# Patient Record
Sex: Male | Born: 1950
Health system: Southern US, Community
[De-identification: ages and names within clinical notes are randomized; demographics above are authoritative.]

## PROBLEM LIST (undated history)

## (undated) DIAGNOSIS — E119 Type 2 diabetes mellitus without complications: Secondary | ICD-10-CM

## (undated) DIAGNOSIS — J9610 Chronic respiratory failure, unspecified whether with hypoxia or hypercapnia: Secondary | ICD-10-CM

## (undated) DIAGNOSIS — N289 Disorder of kidney and ureter, unspecified: Secondary | ICD-10-CM

## (undated) DIAGNOSIS — G4730 Sleep apnea, unspecified: Secondary | ICD-10-CM

## (undated) DIAGNOSIS — I10 Essential (primary) hypertension: Secondary | ICD-10-CM

## (undated) DIAGNOSIS — R7303 Prediabetes: Secondary | ICD-10-CM

## (undated) DIAGNOSIS — R059 Cough, unspecified: Secondary | ICD-10-CM

## (undated) DIAGNOSIS — E7800 Pure hypercholesterolemia, unspecified: Secondary | ICD-10-CM

## (undated) DIAGNOSIS — M1990 Unspecified osteoarthritis, unspecified site: Secondary | ICD-10-CM

## (undated) DIAGNOSIS — R05 Cough: Secondary | ICD-10-CM

## (undated) DIAGNOSIS — Z87442 Personal history of urinary calculi: Secondary | ICD-10-CM

## (undated) DIAGNOSIS — C801 Malignant (primary) neoplasm, unspecified: Secondary | ICD-10-CM

## (undated) DIAGNOSIS — J189 Pneumonia, unspecified organism: Secondary | ICD-10-CM

## (undated) DIAGNOSIS — K08409 Partial loss of teeth, unspecified cause, unspecified class: Secondary | ICD-10-CM

## (undated) DIAGNOSIS — M48062 Spinal stenosis, lumbar region with neurogenic claudication: Secondary | ICD-10-CM

## (undated) DIAGNOSIS — K219 Gastro-esophageal reflux disease without esophagitis: Secondary | ICD-10-CM

## (undated) DIAGNOSIS — K635 Polyp of colon: Secondary | ICD-10-CM

## (undated) DIAGNOSIS — R0600 Dyspnea, unspecified: Secondary | ICD-10-CM

## (undated) HISTORY — PX: LITHOTRIPSY: SUR834

## (undated) HISTORY — PX: VASECTOMY: SHX75

## (undated) HISTORY — PX: SHOULDER SURGERY: SHX246

## (undated) HISTORY — PX: APPENDECTOMY: SHX54

## (undated) HISTORY — PX: COLONOSCOPY W/ POLYPECTOMY: SHX1380

## (undated) HISTORY — PX: BACK SURGERY: SHX140

## (undated) HISTORY — PX: OTHER SURGICAL HISTORY: SHX169

## (undated) HISTORY — PX: HERNIA REPAIR: SHX51

## (undated) HISTORY — PX: CHOLECYSTECTOMY: SHX55

---

## 2001-06-22 ENCOUNTER — Ambulatory Visit (HOSPITAL_COMMUNITY): Admission: RE | Admit: 2001-06-22 | Discharge: 2001-06-22 | Payer: Self-pay | Admitting: Internal Medicine

## 2001-06-22 ENCOUNTER — Encounter: Payer: Self-pay | Admitting: Internal Medicine

## 2001-09-07 ENCOUNTER — Encounter: Payer: Self-pay | Admitting: Family Medicine

## 2001-09-07 ENCOUNTER — Ambulatory Visit (HOSPITAL_COMMUNITY): Admission: RE | Admit: 2001-09-07 | Discharge: 2001-09-07 | Payer: Self-pay | Admitting: Family Medicine

## 2001-09-11 ENCOUNTER — Encounter: Payer: Self-pay | Admitting: Family Medicine

## 2001-09-11 ENCOUNTER — Ambulatory Visit (HOSPITAL_COMMUNITY): Admission: RE | Admit: 2001-09-11 | Discharge: 2001-09-11 | Payer: Self-pay | Admitting: Family Medicine

## 2001-09-28 ENCOUNTER — Encounter: Payer: Self-pay | Admitting: Neurosurgery

## 2001-09-28 ENCOUNTER — Inpatient Hospital Stay (HOSPITAL_COMMUNITY): Admission: RE | Admit: 2001-09-28 | Discharge: 2001-09-29 | Payer: Self-pay | Admitting: Neurosurgery

## 2002-05-25 ENCOUNTER — Encounter: Payer: Self-pay | Admitting: Internal Medicine

## 2002-05-25 ENCOUNTER — Ambulatory Visit (HOSPITAL_COMMUNITY): Admission: RE | Admit: 2002-05-25 | Discharge: 2002-05-25 | Payer: Self-pay | Admitting: Internal Medicine

## 2002-06-03 ENCOUNTER — Encounter (HOSPITAL_COMMUNITY): Admission: RE | Admit: 2002-06-03 | Discharge: 2002-07-03 | Payer: Self-pay | Admitting: Neurosurgery

## 2003-01-11 ENCOUNTER — Ambulatory Visit (HOSPITAL_COMMUNITY): Admission: RE | Admit: 2003-01-11 | Discharge: 2003-01-11 | Payer: Self-pay | Admitting: Neurosurgery

## 2003-01-11 ENCOUNTER — Encounter: Payer: Self-pay | Admitting: Neurosurgery

## 2003-03-18 ENCOUNTER — Encounter: Payer: Self-pay | Admitting: Urology

## 2003-03-18 ENCOUNTER — Ambulatory Visit (HOSPITAL_COMMUNITY): Admission: RE | Admit: 2003-03-18 | Discharge: 2003-03-18 | Payer: Self-pay | Admitting: Urology

## 2003-03-23 ENCOUNTER — Ambulatory Visit (HOSPITAL_COMMUNITY): Admission: RE | Admit: 2003-03-23 | Discharge: 2003-03-23 | Payer: Self-pay | Admitting: Urology

## 2003-03-23 ENCOUNTER — Encounter: Payer: Self-pay | Admitting: Urology

## 2003-03-24 ENCOUNTER — Encounter: Payer: Self-pay | Admitting: Urology

## 2003-03-24 ENCOUNTER — Ambulatory Visit (HOSPITAL_COMMUNITY): Admission: RE | Admit: 2003-03-24 | Discharge: 2003-03-24 | Payer: Self-pay | Admitting: *Deleted

## 2003-04-11 ENCOUNTER — Encounter: Payer: Self-pay | Admitting: Urology

## 2003-04-11 ENCOUNTER — Ambulatory Visit (HOSPITAL_COMMUNITY): Admission: RE | Admit: 2003-04-11 | Discharge: 2003-04-11 | Payer: Self-pay | Admitting: Urology

## 2003-06-01 ENCOUNTER — Encounter: Payer: Self-pay | Admitting: Urology

## 2003-06-01 ENCOUNTER — Ambulatory Visit (HOSPITAL_COMMUNITY): Admission: RE | Admit: 2003-06-01 | Discharge: 2003-06-01 | Payer: Self-pay | Admitting: Urology

## 2003-11-17 ENCOUNTER — Ambulatory Visit (HOSPITAL_COMMUNITY): Admission: RE | Admit: 2003-11-17 | Discharge: 2003-11-17 | Payer: Self-pay | Admitting: Family Medicine

## 2005-08-16 ENCOUNTER — Ambulatory Visit (HOSPITAL_COMMUNITY): Admission: RE | Admit: 2005-08-16 | Discharge: 2005-08-16 | Payer: Self-pay | Admitting: Family Medicine

## 2005-10-28 ENCOUNTER — Ambulatory Visit (HOSPITAL_COMMUNITY): Admission: RE | Admit: 2005-10-28 | Discharge: 2005-10-28 | Payer: Self-pay | Admitting: Urology

## 2006-07-04 ENCOUNTER — Ambulatory Visit (HOSPITAL_COMMUNITY): Admission: RE | Admit: 2006-07-04 | Discharge: 2006-07-04 | Payer: Self-pay | Admitting: General Surgery

## 2006-07-04 ENCOUNTER — Encounter (INDEPENDENT_AMBULATORY_CARE_PROVIDER_SITE_OTHER): Payer: Self-pay | Admitting: Specialist

## 2007-02-17 ENCOUNTER — Emergency Department: Payer: Self-pay | Admitting: Emergency Medicine

## 2007-05-29 ENCOUNTER — Ambulatory Visit (HOSPITAL_COMMUNITY): Admission: RE | Admit: 2007-05-29 | Discharge: 2007-05-29 | Payer: Self-pay | Admitting: Family Medicine

## 2007-06-12 ENCOUNTER — Ambulatory Visit (HOSPITAL_COMMUNITY): Admission: RE | Admit: 2007-06-12 | Discharge: 2007-06-12 | Payer: Self-pay | Admitting: Family Medicine

## 2007-06-12 ENCOUNTER — Ambulatory Visit (HOSPITAL_COMMUNITY): Admission: RE | Admit: 2007-06-12 | Discharge: 2007-06-12 | Payer: Self-pay | Admitting: Urology

## 2007-07-10 ENCOUNTER — Ambulatory Visit (HOSPITAL_COMMUNITY): Admission: RE | Admit: 2007-07-10 | Discharge: 2007-07-10 | Payer: Self-pay | Admitting: Urology

## 2007-12-29 ENCOUNTER — Ambulatory Visit (HOSPITAL_COMMUNITY): Admission: RE | Admit: 2007-12-29 | Discharge: 2007-12-29 | Payer: Self-pay | Admitting: Urology

## 2008-01-15 ENCOUNTER — Ambulatory Visit (HOSPITAL_COMMUNITY): Admission: RE | Admit: 2008-01-15 | Discharge: 2008-01-15 | Payer: Self-pay | Admitting: Urology

## 2008-04-11 ENCOUNTER — Ambulatory Visit (HOSPITAL_COMMUNITY): Admission: RE | Admit: 2008-04-11 | Discharge: 2008-04-11 | Payer: Self-pay | Admitting: Urology

## 2008-04-19 ENCOUNTER — Ambulatory Visit (HOSPITAL_COMMUNITY): Admission: RE | Admit: 2008-04-19 | Discharge: 2008-04-19 | Payer: Self-pay | Admitting: General Surgery

## 2008-04-19 ENCOUNTER — Encounter (INDEPENDENT_AMBULATORY_CARE_PROVIDER_SITE_OTHER): Payer: Self-pay | Admitting: General Surgery

## 2008-04-20 ENCOUNTER — Ambulatory Visit (HOSPITAL_COMMUNITY): Admission: RE | Admit: 2008-04-20 | Discharge: 2008-04-20 | Payer: Self-pay | Admitting: General Surgery

## 2009-01-25 ENCOUNTER — Ambulatory Visit (HOSPITAL_COMMUNITY): Admission: RE | Admit: 2009-01-25 | Discharge: 2009-01-25 | Payer: Self-pay | Admitting: Family Medicine

## 2009-10-14 HISTORY — PX: NM MYOVIEW LTD: HXRAD82

## 2010-04-06 ENCOUNTER — Encounter: Admission: RE | Admit: 2010-04-06 | Discharge: 2010-04-06 | Payer: Self-pay | Admitting: Sports Medicine

## 2010-06-29 ENCOUNTER — Ambulatory Visit (HOSPITAL_COMMUNITY)
Admission: RE | Admit: 2010-06-29 | Discharge: 2010-06-29 | Payer: Self-pay | Source: Home / Self Care | Admitting: Family Medicine

## 2010-08-24 ENCOUNTER — Encounter (HOSPITAL_COMMUNITY)
Admission: RE | Admit: 2010-08-24 | Discharge: 2010-09-23 | Payer: Self-pay | Source: Home / Self Care | Attending: Orthopedic Surgery | Admitting: Orthopedic Surgery

## 2010-09-25 ENCOUNTER — Encounter (HOSPITAL_COMMUNITY)
Admission: RE | Admit: 2010-09-25 | Discharge: 2010-10-25 | Payer: Self-pay | Source: Home / Self Care | Attending: Orthopedic Surgery | Admitting: Orthopedic Surgery

## 2010-09-28 ENCOUNTER — Ambulatory Visit (HOSPITAL_COMMUNITY)
Admission: RE | Admit: 2010-09-28 | Discharge: 2010-09-28 | Payer: Self-pay | Source: Home / Self Care | Attending: Family Medicine | Admitting: Family Medicine

## 2010-11-04 ENCOUNTER — Encounter: Payer: Self-pay | Admitting: Urology

## 2011-02-26 NOTE — Op Note (Signed)
NAME:  Mark Cannon, Mark Cannon NO.:  [id number]   MEDICAL RECORD NO.:  [id number]          PATIENT TYPE:  AMB   LOCATION:  DAY                           FACILITY:  [facility]   PHYSICIAN:  Dalia Heading, M.D.  DATE OF BIRTH:  31-Dec-1950   DATE OF PROCEDURE:  04/20/2008  DATE OF DISCHARGE:                               OPERATIVE REPORT   PREOPERATIVE DIAGNOSIS:  Recurrent left inguinal hernia.   POSTOPERATIVE DIAGNOSIS:  Recurrent left inguinal hernia.   PROCEDURE:  Recurrent left inguinal herniorrhaphy.   SURGEON:  Dalia Heading, MD   ANESTHESIA:  General.   INDICATIONS:  This patient is a 60 year old white male status post left  inguinal herniorrhaphy in the remote past who now presents with  recurrent left inguinal hernia.  In addition, he has a right hydrocele  which is to be repaired under the same anesthesia by Dr. Rito Ehrlich of  urology.  The risks and benefits of the procedure including bleeding,  infection, pain, the possibility of vascular compromise to the left  testicle, and the possibility of recurrence of the hernia were fully  explained to the patient, he gave informed consent.   PROCEDURE NOTE:  The patient was placed in the supine position.  After  general anesthesia was administered, the left groin region was prepped  and draped using the usual sterile technique with Betadine.  Surgical  site confirmation was performed.   An oblique incision was made in the left groin region through the  previous surgical scar.  This was taken down to the external oblique  aponeuroses.  A small amount of polypropylene mesh was noted in the  internal ring area.  It was difficult to the assert exactly what type of  previous hernia repair was performed other than a limited amount of mesh  was used.  This appeared to be in the internal ring area.  This was  excised without difficulty.  An indirect hernia was found.  The cord was  retracted superiorly in order to  facilitate placement of a small  polypropylene mesh plug.  It was secured in place at two areas using a 2-  0 Novofil interrupted suture.  An onlay piece of polypropylene mesh was  then placed over this area and the external oblique was covered over  this using 2-0 Novofil interrupted sutures.  The subcutaneous layer was  reapproximated using a 2-0 Vicryl interrupted suture.  The skin was  closed using a 4-0 Vicryl subcuticular suture.  Dermabond was then  applied.   All tape and needle counts were correct at the end of the initial  procedure.  Dr. Rito Ehrlich of urology was then to perform his portion of  the procedure and he will dictate that separately.   COMPLICATIONS:  None.   SPECIMEN:  None.   ESTIMATED BLOOD LOSS:  Minimal.      Dalia Heading, M.D.  Electronically Signed     MAJ/MEDQ  D:  04/20/2008  T:  04/21/2008  Job:  [id number]   cc:   Patrica Duel, M.D.  Fax: [phone]   [facility] Cornea  Rito Ehrlich, M.D.  Fax: [phone]

## 2011-02-26 NOTE — Op Note (Signed)
NAME:  Mark Cannon, Mark Cannon NO.:  [id number]   MEDICAL RECORD NO.:  [id number]          PATIENT TYPE:  AMB   LOCATION:  DAY                           FACILITY:  [facility]   PHYSICIAN:  Dennie Maizes, M.D.   DATE OF BIRTH:  1950/12/10   DATE OF PROCEDURE:  04/20/2008  DATE OF DISCHARGE:                               OPERATIVE REPORT   PREOPERATIVE DIAGNOSIS:  Symptomatic right hydrocele.   POSTOPERATIVE DIAGNOSIS:  Symptomatic right hydrocele.   OPERATIVE PROCEDURE:  Right hydrocelectomy.   ANESTHESIA:  General.   SURGEON:  Dennie Maizes, MD   SPECIMEN:  None.   COMPLICATIONS:  None.   ESTIMATED BLOOD LOSS:  Minimal.   DRAINS:  None.   INDICATIONS FOR THE PROCEDURE:  This 60 year old male had a symptomatic  right hydrocele.  He also had  a recurrent left inguinal hernia.  He was  evaluated by Dr. Lovell Sheehan and scheduled to undergo left inguinal hernia  repair today.  I plan to do a right hydrocelectomy under the same  anesthesia.   DESCRIPTION OF PROCEDURE:  General anesthesia was induced and the  patient was placed on the OR table in the supine position.  Lower  abdomen and genitalia were prepped and draped in a sterile fashion.  Dr.  Lovell Sheehan proceeded with left inguinal hernia repair first.  Examination  of the scrotum revealed a moderate size right hydrocele measuring about  20 x 15 cm in size.  A right hemiscrotal transverse incision was then  made.  The layers of the scrotal wall was incised to expose the tunica  vaginalis.  The hydrocele sac was then separated by blunt and sharp  dissection from the scrotal wall and delivered out to the incision.  The  hydrocele sac was opened and 1400 cc of clear yellow fluid was drained.  Examination revealed normal testis and epididymis.  The hydrocele sac  was then reversed behind the testis and spermatic cord structures.  Hemostasis was obtained by cauterization.  The edges of the sac was then  approximated using 3-0  Vicryl sutures.  The scrotal bed was then closely  examined.  Complete hemostasis was obtained by cauterization.  The  testis was then repositioned in the scrotum.  The scrotal incision was  closed in two layers using 4-0 chromic gut for the subcutaneous tissues  and 3-0 chromic gut for the skin.  A scrotal dressing and support were  applied.  Estimated blood loss was minimal.  The sponges and instruments  were correct x2 at the time of closure.  The patient was then  transferred to the PACU in a satisfactory condition.      Dennie Maizes, M.D.  Electronically Signed     SK/MEDQ  D:  04/20/2008  T:  04/20/2008  Job:  [id number]   cc:   Kirk Ruths, M.D.  Fax: [phone]

## 2011-02-26 NOTE — H&P (Signed)
NAME:  Mark Cannon, Mark Cannon NO.:  [id number]   MEDICAL RECORD NO.:  [id number]          PATIENT TYPE:  AMB   LOCATION:  DAY                           FACILITY:  [facility]   PHYSICIAN:  Dennie Maizes, M.D.   DATE OF BIRTH:  Feb 26, 1951   DATE OF ADMISSION:  DATE OF DISCHARGE:  LH                              HISTORY & PHYSICAL   He is coming to the [facility] on April 20, 2008.   ATTENDING PHYSICIAN:  Dennie Maizes, M.D.   CHIEF COMPLAINT:  Right hemiscrotal swelling.   HISTORY OF PRESENT ILLNESS:  This 60 year old male has been under my  care for several years.  He has history of recurrent urolithiasis and  has undergone multiple ureteroscopy, stone extraction, and as well as  lithotripsies.   It is noted to have right hemiscrotal swelling for few years.  Swelling  has been increasing in size recently and he has some pain and discomfort  over the right hemiscrotum.  Evaluation revealed a right hydrocele.  The  patient is brought to the [facility] today for right  hydrocelectomy.  He also had left inguinal pain for which he had been  evaluated per Dr. Lovell Sheehan and scheduled to undergo left inguinal hernia  repair.   The patient denied having any voiding difficulty, abdominal pain, or  flank pain.  At present, he has good urinary flow, urinary frequency x4  to 5 and nocturia times x0 to 1.  There is no history of hematuria or  urinary tract infection.   PAST MEDICAL HISTORY:  History of recurrent urolithiasis status post  ureteroscopy, stone extraction, and lithotripsy.  Status post  cholecystectomy, history of degenerative disk disease, and status post  left inguinal hernia repair.   MEDICATIONS:  None.   ALLERGIES:  SULFA.   PHYSICAL EXAMINATION:  Head, Eyes, Ears, Nose, and Throat:  Normal.  NECK:  No masses.  LUNGS:  Clear to auscultation.  HEART:  Regular rate and rhythm.  No murmurs.  ABDOMEN:  Soft.  No palpable flank mass or CVA tenderness.  GU:  Bladder not palpable.  Left inguinal scar due to previous  herniorrhaphy has been noted.  There is a cystic swelling in the right  hemiscrotum suggestive of a large hydrocele.  Right testis could not be  felt distinctly.  Left testis is normal.   A scrotal ultrasound has been done for further evaluation of the testes.  No intratesticular pathology has been noted.   IMPRESSION:  Right hydrocele.   PLAN:  Right hydrocelectomy in [facility].  I have discussed with  the patient regarding the diagnosis, operative details, alternative  treatments, outcomes, possible risks, and complications and he has  agreed for the procedure to be done.      Dennie Maizes, M.D.  Electronically Signed     SK/MEDQ  D:  04/18/2008  T:  04/18/2008  Job:  [id number]   cc:   Patrica Duel, M.D.  Fax: [phone]   [facility]   Dr. Lovell Sheehan

## 2011-02-26 NOTE — H&P (Signed)
NAME:  Mark Cannon, Mark Cannon NO.:  [id number]   MEDICAL RECORD NO.:  [id number]          PATIENT TYPE:  AMB   LOCATION:  DAY                           FACILITY:  [facility]   PHYSICIAN:  Dalia Heading, M.D.  DATE OF BIRTH:  06/05/1951   DATE OF ADMISSION:  DATE OF DISCHARGE:  LH                              HISTORY & PHYSICAL   CHIEF COMPLAINT:  Recurrent left inguinal hernia, right hydrocele, need  for screening colonoscopy.   HISTORY OF PRESENT ILLNESS:  The patient is a 60 year old white male who  was referred for evaluation and treatment of recurrent left inguinal  hernia.  He had a left inguinal herniorrhaphy in the remote past by Dr.  Elpidio Anis.  Over the past month, he has had increasing swelling and  pain in the left inguinal region, made worse with straining.  He is also  being followed by Dr. Rito Ehrlich of Urology for right hydrocele.  He also  needs a screening colonoscopy.  No hematochezia, melena, diarrhea,  constipation, or family history of colon carcinoma has noted.  He has  never had a colonoscopy.   PAST MEDICAL HISTORY:  Unremarkable.   PAST SURGICAL HISTORY:  As noted above, cholecystectomy, neck surgery,  and left knee surgery.   CURRENT MEDICATIONS:  None.   ALLERGIES:  No known drug allergies.   REVIEW OF SYSTEMS:  The patient smokes one pack of cigarettes a day.  He  drinks alcohol daily.  He denies any cardiopulmonary difficulties or  bleeding disorders.   PHYSICAL EXAMINATION:  GENERAL:  The patient is a well-developed, well-  nourished white male, in no acute distress.  LUNGS:  Clear to auscultation with equal breath sounds bilaterally.  HEART:  Regular rate and rhythm without S3, S4, or murmurs.  ABDOMEN:  Soft, nontender, and nondistended.  No hepatosplenomegaly or  masses noted.  Small left inguinal hernia is present.  No right inguinal  is present.  GENITOURINARY:  Reveals a large right hydrocele with a small left  testicle.   IMPRESSION:  Recurrent left inguinal hernia, right hydrocele, and need  for screening colonoscopy.   PLAN:  The patient is scheduled for a colonoscopy on April 19, 2008, and  then a recurrent left inguinal herniorrhaphy with a right hydrocelectomy  repair by Dr. Rito Ehrlich on April 20, 2008.  The risks and benefits of the  procedures including bleeding, infection, recurrence, and the  possibility chronic pain were fully explained to the patient, gave  informed consent.      Dalia Heading, M.D.  Electronically Signed     MAJ/MEDQ  D:  04/12/2008  T:  04/13/2008  Job:  [id number]   cc:   Patrica Duel, M.D.  Fax: [phone]

## 2011-03-01 NOTE — Op Note (Signed)
NAME:  DILYN, SMILES NO.:  [id number]   MEDICAL RECORD NO.:  [id number]          PATIENT TYPE:  AMB   LOCATION:  DAY                           FACILITY:  [facility]   PHYSICIAN:  Dalia Heading, M.D.  DATE OF BIRTH:  [birth year]   DATE OF PROCEDURE:  07/04/2006  DATE OF DISCHARGE:                                 OPERATIVE REPORT   PREOPERATIVE DIAGNOSIS:  Internal and external thrombosed hemorrhoid.   POSTOPERATIVE DIAGNOSIS:  Internal and external thrombosed hemorrhoid.   PROCEDURE:  Extensive hemorrhoidectomy.   SURGEON:  Dr. Franky Macho.   ANESTHESIA:  General.   INDICATIONS:  The patient is a 60 year old white male who presents with a  thrombosed internal and external hemorrhoid.  The risks and benefits of the  procedure, including bleeding, infection, recurrence of the hemorrhoidal  disease, were fully explained to the patient, gave informed consent.   PROCEDURE NOTE:  The patient was placed in the lithotomy position after  general anesthesia was administered.  The perineum was prepped and draped  using the usual sterile technique with Betadine.  Surgical site confirmation  was performed.   On examination, the patient had a large internal and external thrombosed  hemorrhoid at the 4 o'clock position.  The internal and external hemorrhoid  was excised in continuity without difficulty.  Care was taken to avoid the  external sphincter.  The mucosal edge was reapproximated using a 2-0 Vicryl  running suture.  Other less prominent hemorrhoids were noted, though none  needing excision.  Surgicel and viscous Xylocaine packing was then placed  into the rectum.   All __________  needle counts were correct at the end of the procedure.  The  patient was awakened and transferred to PACU in stable condition.   COMPLICATIONS:  None.   SPECIMEN:  Thrombosed hemorrhoids.   BLOOD LOSS:  Minimal.      Dalia Heading, M.D.  Electronically Signed     MAJ/MEDQ   D:  07/04/2006  T:  07/07/2006  Job:  [id number]   cc:   [facility]

## 2011-03-01 NOTE — H&P (Signed)
   NAME:  Mark Cannon, Mark Cannon                          ACCOUNT NO.:  [id number]   MEDICAL RECORD NO.:  [id number]                   PATIENT TYPE:  AMB   LOCATION:  DAY                                  FACILITY:  [facility]   PHYSICIAN:  Dennie Maizes, M.D.                DATE OF BIRTH:  1951/07/19   DATE OF ADMISSION:  03/23/2003  DATE OF DISCHARGE:                                HISTORY & PHYSICAL   CHIEF COMPLAINT:  Left flank pain, intermittent mild hematuria.   HISTORY OF PRESENT ILLNESS:  This 60 year old male has a past history of  recurrent ureterolithiasis.  He has been experiencing intermittent left  flank pain and intermittent mild hematuria for the past 10 days.  X-ray of  the KUB area revealed a 7 x 4-mm-size left upper ureteral calculus.  The  patient is unable to pass the stone.  He is brought to the day hospital  today for ESWL of the left upper ureteral calculus.  The patient did not  have any fever, chills, voiding difficulty or dysuria.   PAST MEDICAL HISTORY:  History of recurrent ureterolithiasis, status post  ESWL of left renal calculus in 1991, status post ureteroscopic stone  extraction, status post cholecystectomy, status post knee surgery, status  post hernia repair.   MEDICATIONS:  Oxycodone p.r.n. for pain.   ALLERGIES:  SULFA.   PHYSICAL EXAMINATION:  GENERAL:  The patient was in moderate pain.  HEENT:  Normal.  NECK:  No masses.  LUNGS:  Lungs clear to auscultation.  HEART:  Regular rate and rhythm with no murmurs.  ABDOMEN:  Abdomen soft.  No palpable flank mass.  Mild left costovertebral  angle tenderness was noted.  Bladder not palpable.  GU:  Penis normal.  Testes are normal.  There is a small right hydrocele.  RECTAL:  Twenty-five-gram-size benign prostate.   IMPRESSION:  Left upper ureteral calculus with obstruction, 7 x 4 mm; left  renal colic; hematuria.    PLAN:  ESWL of left upper ureteral calculus with IV sedation day hospital.  I have  discussed with the patient regarding the diagnosis, operative  details, alternative treatments, outcome, possible risks and complications  and he has agreed for the procedure to be done.                                               Dennie Maizes, M.D.    SK/MEDQ  D:  03/23/2003  T:  03/23/2003  Job:  [id number]   cc:   Patrica Duel, M.D.  [address]  [place]  [place] [address]  Fax: [phone]

## 2011-03-01 NOTE — H&P (Signed)
NAME:  Mark Cannon, Mark Cannon NO.:  [id number]   MEDICAL RECORD NO.:  [id number]           PATIENT TYPE:  AMB   LOCATION:                                FACILITY:  [facility]   PHYSICIAN:  Dalia Heading, M.D.  DATE OF BIRTH:  09-14-1951   DATE OF ADMISSION:  DATE OF DISCHARGE:  LH                                HISTORY & PHYSICAL   CHIEF COMPLAINT:  Thrombosed hemorrhoid.   HISTORY OF PRESENT ILLNESS:  The patient is a 60 year old white male who was  referred for evaluation and treatment of hemorrhoidal disease.  He started  having swelling and pain last week.  The pain has persisted.  No bleeding  has been noted.  He has had a hemorrhoidectomy in the remote past.  He has  never had a colonoscopy.   PAST MEDICAL HISTORY:  Is unremarkable.   PAST SURGICAL HISTORY:  1. Herniorrhaphy.  2. Knee surgery.  3. Back surgery.  4. Cholecystectomy.  5. Vasectomy.   CURRENT MEDICATIONS:  None.   ALLERGIES:  No known drug allergies.   REVIEW OF SYSTEMS:  The patient smokes a pack of cigarettes a day.  He does  two shots of alcohol a day.  He denies any other cardiopulmonary  difficulties or bleeding disorders.   ON PHYSICAL EXAMINATION:  The patient is a well-developed, well-nourished,  white male in no acute distress.  LUNGS:  Clear to auscultation with equal breath sounds bilaterally.  HEART EXAMINATION:  Reveals a regular rate and rhythm without S3, S4, or  murmurs.  RECTAL EXAMINATION:  Reveals a thrombosed hemorrhoid noted along  the left lateral aspect of the anus.  Examination is limited secondary to  pain.   IMPRESSION:  Thrombosed hemorrhoid.   PLAN:  The patient is scheduled for a hemorrhoidectomy on July 04, 2006.  The risks and benefits of the procedure, including bleeding,  infection, and recurrence of the hemorrhoids, were fully explained to the  patient.  Gave informed consent.      Dalia Heading, M.D.  Electronically Signed     MAJ/MEDQ  D:   07/03/2006  T:  07/03/2006  Job:  [id number]

## 2011-03-01 NOTE — Op Note (Signed)
[facility]  Patient:    CHIRAG, KRUEGER Visit Number: [id number] MRN: [id number]          Service Type: SUR Location: [id number] Attending Physician:  Barton Fanny Dictated by:   Hewitt Shorts, M.D. Proc. Date: 09/28/01 Admit Date:  09/28/2001                             Operative Report  PREOPERATIVE DIAGNOSIS:  C5-6 and C6-7 herniated cervical disk, degenerative disk disease, spondylosis and radiculopathy.  POSTOPERATIVE DIAGNOSIS:  C5-6 and C6-7 herniated cervical disk, degenerative disk disease, spondylosis and radiculopathy.  OPERATION PERFORMED:  C5-6 and C6-7 anterior cervical diskectomy and arthrodesis with iliac crest allograft and Trinica cervical plating.  SURGEON:  Hewitt Shorts, M.D.  ASSISTANT:  Payton Doughty, M.D.  ANESTHESIA:  General endotracheal.  INDICATIONS FOR PROCEDURE:  The patient is a 60 year old man who presented with left cervical radiculopathy and was found to have disk herniation superimposed on degenerative disk disease and spondylosis and C5-6 and C6-7. Decision was made to proceed with a two-level anterior cervical diskectomy and arthrodesis.  DESCRIPTION OF PROCEDURE:  The patient was brought to the operating room and placed under general endotracheal anesthesia.  The patient was placed in 10 pounds of halter traction and the neck was prepped with Betadine soap and solution and was draped in sterile fashion.  A horizontal incision was made in the left side of the neck.  The line of incision was infiltrated with local anesthetic with epinephrine.  Incision was made with a Shaw scalpel at a temperature of 120.  Dissection was carried down to the subcutaneous tissues and platysma.  Dissection was then carried out to an avascular plane leaving the sternocleidomastoid, carotid artery and jugular vein laterally and the trachea and esophagus medially.  The ventral aspect of the vertebral  column was identified and localizing x-ray taken and the C5-6 and C6-7 intervertebral disk space identified.  Diskectomy was begun with incision of the annulus and continued with the microcurets and pituitary rongeurs.  The cartilaginous end plates and corresponding vertebrae were removed with microcurets and the Micromax drill.  The anterior osteophytic overgrowth was removed using double action rongeurs.  The microscope was draped and brought into the field to provide additional magnification, illumination and visualization.  The remainder of the procedure was performed using microdissection and microsurgical technique.  The posterior osteophytic overgrowth was removed using the Micromax drill and the 2 mm Kerrison punch with a thin foot plate. The posterior longitudinal ligament was removed and at C6-7 we encountered significant disk herniation laterally into the left C6-7 foramen.  We were able to remove of the disk herniation at each level.  The posterior longitudinal ligament was removed and foraminotomy was performed bilaterally. We were able to decompress the spinal canal and thecal sac as well as the foramina and nerve roots bilaterally at each level.  Once the decompression was completed, hemostasis was established with the use of Gelfoam soaked in thrombin and then we proceeded with the arthrodesis.  We selected two 7 mm wedges of iliac crest allograft.  They were cut and shaped to size and positioned in the intervertebral disk space and countersunk.  We then selected a 46 mm Trinica cervical plate.  It was positioned over the fusion construct and secured with a pair of screws at each level.  We used 4.2 x 16 mm screws  at C5 and C7 and 4.2 x 14 mm screws at C6.  X-ray visualized the C5-6 level but did not visualize the C6-7 level due to his large shoulders.  However, under direct visualization, the fusion construct appeared good.  The alignment was good and the plate and screws  and grafts were in good position.  The wound was irrigated with bacitracin solution and checked for hemostasis which was established and confirmed and then we proceeded with closure.  The platysma was closed with interrupted inverted 2-0 undyed Vicryl sutures and the subcutaneous and subcuticular layer were closed with interrupted inverted 3-0 undyed Vicryl sutures and skin edges reapproximated with Dermabond.  The patient tolerated the procedure well.  Estimated blood loss for this procedure was 150 cc.  Sponge, needle and instrument counts were correct.  Following surgery the patient was taken out of cervical traction at the time of arthrodesis was placed in a soft cervical collar, reversed from anesthetic, to be extubated and was transferred to the recovery room for further care. Dictated by:   Hewitt Shorts, M.D. Attending Physician:  Barton Fanny DD:  09/28/01 TD:  09/28/01 Job: [id number]

## 2011-07-11 LAB — DIFFERENTIAL
Basophils Absolute: 0.1
Eosinophils Relative: 2
Lymphocytes Relative: 26
Lymphs Abs: 2.3
Monocytes Absolute: 0.6
Monocytes Relative: 7
Neutro Abs: 5.9

## 2011-07-11 LAB — BASIC METABOLIC PANEL
GFR calc Af Amer: >60
GFR calc non Af Amer: >60
Glucose, Bld: 77
Potassium: 4
Sodium: 139

## 2011-07-11 LAB — CBC
HCT: 50.6
Hemoglobin: 17.2 — ABNORMAL HIGH
RBC: 5.6
RDW: 13.4

## 2012-01-06 NOTE — H&P (Signed)
  NTS SOAP Note  Vital Signs:  Vitals as of: 12/26/2011: Systolic 159: Diastolic 84: Heart Rate 79: Temp 27F: Height 59ft 7in: Weight 212Lbs 5 Ounces: OFC 0in: Respiratory Rate 0: O2 Saturation 0: Pain Level 0: BMI 33  BMI : 33.25 kg/m2  Subjective: This 61 Years 109 Months old Male presents for follow up TCS.  Had tubular polyp removed in 2009.  Denies any GI complaints.  No family h/o colon carcinoma.  Review of Symptoms:  Constitutional:unremarkable Head:unremarkable Eyes:unremarkable Nose/Mouth/Throat:unremarkable Cardiovascular:unremarkable Respiratory:unremarkable Gastrointestinal:unremarkable Genitourinary:unremarkable Musculoskeletal:unremarkable Skin:unremarkable Hematolgic/Lymphatic:unremarkable Allergic/Immunologic:unremarkable   Past Medical History:Reviewed   Past Medical History  Surgical History: cholecystectomy, lithotrypsy, LIH, neck, knee Medical Problems:  High Blood pressure, High cholesterol Allergies: nkda Medications: lipitor, toprol, HCTZ, asa   Social History:Reviewed   Social History  Preferred Language: English ([place]) Ethnicity: Not Hispanic / Latino Age: 61 Years 10 Months Marital Status:  M Alcohol:  Yes, How much 1 drink per day    Smoking Status: Current every day smoker reviewed on 12/26/2011 Started Date: 10/14/1973 Packs per day: 1.00   Family History:Reviewed   Family History  Is there a family history of:No family h/o colon carcinoma    Objective Information: General:Well appearing, well nourished in no distress. Head:Atraumatic; no masses; no abnormalities Neck:Supple without lymphadenopathy.  Heart:RRR, no murmur or gallop.  Normal S1, S2.  No S3, S4.  Lungs:CTA bilaterally, no wheezes, rhonchi, rales.  Breathing unlabored. Abdomen:Soft, NT/ND, no HSM, no masses. deferred to procedure  Assessment:h/o colon polyp  Diagnosis &amp;  Procedure: DiagnosisCode: V12.72, ProcedureCode: 62952,    Plan:Will call to schedule TCS.   Patient Education:Alternative treatments to surgery were discussed with patient (and family).Risks and benefits  of procedure were fully explained to the patient (and family) who gave informed consent. Patient/family questions were addressed.  Follow-up:Pending Surgery

## 2012-02-03 ENCOUNTER — Encounter (HOSPITAL_COMMUNITY): Payer: Self-pay | Admitting: Pharmacy Technician

## 2012-02-03 MED ORDER — SODIUM CHLORIDE 0.45 % IV SOLN
Freq: Once | INTRAVENOUS | Status: AC
  Administered 2012-02-04: 08:00:00 via INTRAVENOUS

## 2012-02-04 ENCOUNTER — Encounter (HOSPITAL_COMMUNITY): Payer: Self-pay | Admitting: *Deleted

## 2012-02-04 ENCOUNTER — Encounter (HOSPITAL_COMMUNITY)
Admission: RE | Disposition: A | Discharge status: Home / Self Care | Payer: Self-pay | Source: Ambulatory Visit | Attending: General Surgery

## 2012-02-04 ENCOUNTER — Ambulatory Visit (HOSPITAL_COMMUNITY)
Admission: RE | Admit: 2012-02-04 | Discharge: 2012-02-04 | Disposition: A | Discharge status: Home / Self Care | Payer: PRIVATE HEALTH INSURANCE | Source: Ambulatory Visit | Attending: General Surgery | Admitting: General Surgery

## 2012-02-04 DIAGNOSIS — K5730 Diverticulosis of large intestine without perforation or abscess without bleeding: Secondary | ICD-10-CM | POA: Insufficient documentation

## 2012-02-04 DIAGNOSIS — Z7982 Long term (current) use of aspirin: Secondary | ICD-10-CM | POA: Insufficient documentation

## 2012-02-04 DIAGNOSIS — D126 Benign neoplasm of colon, unspecified: Secondary | ICD-10-CM | POA: Insufficient documentation

## 2012-02-04 DIAGNOSIS — Z79899 Other long term (current) drug therapy: Secondary | ICD-10-CM | POA: Insufficient documentation

## 2012-02-04 DIAGNOSIS — I10 Essential (primary) hypertension: Secondary | ICD-10-CM | POA: Insufficient documentation

## 2012-02-04 DIAGNOSIS — Z09 Encounter for follow-up examination after completed treatment for conditions other than malignant neoplasm: Secondary | ICD-10-CM | POA: Insufficient documentation

## 2012-02-04 DIAGNOSIS — E7800 Pure hypercholesterolemia, unspecified: Secondary | ICD-10-CM | POA: Insufficient documentation

## 2012-02-04 DIAGNOSIS — Z860100 Personal history of colon polyps, unspecified: Secondary | ICD-10-CM | POA: Insufficient documentation

## 2012-02-04 HISTORY — DX: Pure hypercholesterolemia, unspecified: E78.00

## 2012-02-04 HISTORY — DX: Polyp of colon: K63.5

## 2012-02-04 HISTORY — DX: Essential (primary) hypertension: I10

## 2012-02-04 HISTORY — DX: Cough, unspecified: R05.9

## 2012-02-04 HISTORY — DX: Cough: R05

## 2012-02-04 HISTORY — PX: COLONOSCOPY: SHX5424

## 2012-02-04 SURGERY — COLONOSCOPY
Anesthesia: Moderate Sedation

## 2012-02-04 MED ORDER — MIDAZOLAM HCL 5 MG/5ML IJ SOLN
INTRAMUSCULAR | Status: AC
  Filled 2012-02-04: qty 5

## 2012-02-04 MED ORDER — MEPERIDINE HCL 50 MG/ML IJ SOLN
INTRAMUSCULAR | Status: AC
  Filled 2012-02-04: qty 1

## 2012-02-04 MED ORDER — MIDAZOLAM HCL 5 MG/5ML IJ SOLN
INTRAMUSCULAR | Status: DC | PRN
  Administered 2012-02-04: 1 mg via INTRAVENOUS
  Administered 2012-02-04: 4 mg via INTRAVENOUS

## 2012-02-04 MED ORDER — MEPERIDINE HCL 25 MG/ML IJ SOLN
INTRAMUSCULAR | Status: DC | PRN
  Administered 2012-02-04: 25 mg via INTRAVENOUS
  Administered 2012-02-04: 50 mg via INTRAVENOUS

## 2012-02-04 MED ORDER — STERILE WATER FOR IRRIGATION IR SOLN
Status: DC | PRN
  Administered 2012-02-04: 09:00:00

## 2012-02-04 NOTE — Progress Notes (Signed)
To bathroom to expel lots of flatus with relief of abd cramping and distension.

## 2012-02-04 NOTE — Interval H&P Note (Signed)
History and Physical Interval Note:  02/04/2012 8:34 AM  Mark Cannon  has presented today for surgery, with the diagnosis of Colon polyps   The various methods of treatment have been discussed with the patient and family. After consideration of risks, benefits and other options for treatment, the patient has consented to  Procedure(s) (LRB): COLONOSCOPY (N/A) as a surgical intervention .  The patients' history has been reviewed, patient examined, no change in status, stable for surgery.  I have reviewed the patients' chart and labs.  Questions were answered to the patient's satisfaction.     [name]

## 2012-02-04 NOTE — Op Note (Signed)
[facility] [address] [place], [place]  [address]  COLONOSCOPY PROCEDURE REPORT  PATIENT:  Mark Cannon, Mark Cannon  MR#:  [id number] BIRTHDATE:  10-14-1951, 61 yrs. old  GENDER:  male ENDOSCOPIST:  Franky Macho, MD REF. BY: PROCEDURE DATE:  02/04/2012 PROCEDURE:  Colonoscopy with snare polypectomy ASA CLASS:  Class II INDICATIONS:  history of polyps MEDICATIONS:   Versed 5 mg IV, demerol 75 mg IV  DESCRIPTION OF PROCEDURE:   After the risks benefits and alternatives of the procedure were thoroughly explained, informed consent was obtained.  Digital rectal exam was performed and revealed no abnormalities.   The EC-[id number] ([id number]) endoscope was introduced through the anus and advanced to the cecum, which was identified by the ileocecal valve, without limitations.  The quality of the prep was adequate..  The instrument was then slowly withdrawn as the colon was fully examined. <<PROCEDUREIMAGES>>  FINDINGS:  Moderate diverticulosis was found in the left colon.  A sessile polyp was found in the descending colon (see image001 and image002).   Removed using snare cautery and sent to pathology for further examination.  Retroflexed views in the rectum revealed no abnormalities.   The scope was then withdrawn from the cecum and the procedure completed. COMPLICATIONS:  None ENDOSCOPIC IMPRESSION: 1) Moderate diverticulosis in the left colon 2) Sessile polyp in the descending colon 3) Normal colonoscopy otherwise RECOMMENDATIONS:Check pathology  REPEAT EXAM:  In 3 year(s) for Colonoscopy.  ______________________________ Franky Macho, MD  CC:  n. eSIGNEDFranky Macho at 02/04/2012 08:54 AM  [place], [phone]

## 2012-02-04 NOTE — Discharge Instructions (Signed)
Diverticulosis Diverticulosis is a common condition that develops when small pouches (diverticula) form in the wall of the colon. The risk of diverticulosis increases with age. It happens more often in people who eat a low-fiber diet. Most individuals with diverticulosis have no symptoms. Those individuals with symptoms usually experience abdominal pain, constipation, or loose stools (diarrhea). HOME CARE INSTRUCTIONS   Increase the amount of fiber in your diet as directed by your caregiver or dietician. This may reduce symptoms of diverticulosis.   Your caregiver may recommend taking a dietary fiber supplement.   Drink at least 6 to 8 glasses of water each day to prevent constipation.   Try not to strain when you have a bowel movement.   Your caregiver may recommend avoiding nuts and seeds to prevent complications, although this is still an uncertain benefit.   Only take over-the-counter or prescription medicines for pain, discomfort, or fever as directed by your caregiver.  FOODS WITH HIGH FIBER CONTENT INCLUDE:  Fruits. Apple, peach, pear, tangerine, raisins, prunes.   Vegetables. Brussels sprouts, asparagus, broccoli, cabbage, carrot, cauliflower, romaine lettuce, spinach, summer squash, tomato, winter squash, zucchini.   Starchy Vegetables. Baked beans, kidney beans, lima beans, split peas, lentils, potatoes (with skin).   Grains. Whole wheat bread, brown rice, bran flake cereal, plain oatmeal, white rice, shredded wheat, bran muffins.  SEEK IMMEDIATE MEDICAL CARE IF:   You develop increasing pain or severe bloating.   You have an oral temperature above 102 F (38.9 C), not controlled by medicine.   You develop vomiting or bowel movements that are bloody or black.  Document Released: 06/27/2004 Document Revised: 09/19/2011 Document Reviewed: 02/28/2010 [facility] Patient Information 2012 [place], [place].Colon Polyps A polyp is extra tissue that grows inside your body. Colon  polyps grow in the large intestine. The large intestine, also called the colon, is part of your digestive system. It is a long, hollow tube at the end of your digestive tract where your body makes and stores stool. Most polyps are not dangerous. They are benign. This means they are not cancerous. But over time, some types of polyps can turn into cancer. Polyps that are smaller than a pea are usually not harmful. But larger polyps could someday become or may already be cancerous. To be safe, doctors remove all polyps and test them.  WHO GETS POLYPS? Anyone can get polyps, but certain people are more likely than others. You may have a greater chance of getting polyps if:  You are over 50.   You have had polyps before.   Someone in your family has had polyps.   Someone in your family has had cancer of the large intestine.   Find out if someone in your family has had polyps. You may also be more likely to get polyps if you:   Eat a lot of fatty foods.   Smoke.   Drink alcohol.   Do not exercise.   Eat too much.  SYMPTOMS  Most small polyps do not cause symptoms. People often do not know they have one until their caregiver finds it during a regular checkup or while testing them for something else. Some people do have symptoms like these:  Bleeding from the anus. You might notice blood on your underwear or on toilet paper after you have had a bowel movement.   Constipation or diarrhea that lasts more than a week.   Blood in the stool. Blood can make stool look black or it can show up as red  streaks in the stool.  If you have any of these symptoms, see your caregiver. HOW DOES THE DOCTOR TEST FOR POLYPS? The doctor can use four tests to check for polyps:  Digital rectal exam. The caregiver wears gloves and checks your rectum (the last part of the large intestine) to see if it feels normal. This test would find polyps only in the rectum. Your caregiver may need to do one of the other  tests listed below to find polyps higher up in the intestine.   Barium enema. The caregiver puts a liquid called barium into your rectum before taking x-rays of your large intestine. Barium makes your intestine look white in the pictures. Polyps are dark, so they are easy to see.   Sigmoidoscopy. With this test, the caregiver can see inside your large intestine. A thin flexible tube is placed into your rectum. The device is called a sigmoidoscope, which has a light and a tiny video camera in it. The caregiver uses the sigmoidoscope to look at the last third of your large intestine.   Colonoscopy. This test is like sigmoidoscopy, but the caregiver looks at all of the large intestine. It usually requires sedation. This is the most common method for finding and removing polyps.  TREATMENT   The caregiver will remove the polyp during sigmoidoscopy or colonoscopy. The polyp is then tested for cancer.   If you have had polyps, your caregiver may want you to get tested regularly in the future.  PREVENTION  There is not one sure way to prevent polyps. You might be able to lower your risk of getting them if you:  Eat more fruits and vegetables and less fatty food.   Do not smoke.   Avoid alcohol.   Exercise every day.   Lose weight if you are overweight.   Eating more calcium and folate can also lower your risk of getting polyps. Some foods that are rich in calcium are milk, cheese, and broccoli. Some foods that are rich in folate are chickpeas, kidney beans, and spinach.   Aspirin might help prevent polyps. Studies are under way.  Document Released: 06/26/2004 Document Revised: 09/19/2011 Document Reviewed: 12/02/2007 [facility] Patient Information 2012 [place], [place].Colonoscopy Care After Read the instructions outlined below and refer to this sheet in the next few weeks. These discharge instructions provide you with general information on caring for yourself after you leave the hospital. Your  doctor may also give you specific instructions. While your treatment has been planned according to the most current medical practices available, unavoidable complications occasionally occur. If you have any problems or questions after discharge, call your doctor. HOME CARE INSTRUCTIONS ACTIVITY:  You may resume your regular activity, but move at a slower pace for the next 24 hours.   Take frequent rest periods for the next 24 hours.   Walking will help get rid of the air and reduce the bloated feeling in your belly (abdomen).   No driving for 24 hours (because of the medicine (anesthesia) used during the test).   You may shower.   Do not sign any important legal documents or operate any machinery for 24 hours (because of the anesthesia used during the test).  NUTRITION:  Drink plenty of fluids.   You may resume your normal diet as instructed by your doctor.   Begin with a light meal and progress to your normal diet. Heavy or fried foods are harder to digest and may make you feel sick to your stomach (nauseated).  Avoid alcoholic beverages for 24 hours or as instructed.  MEDICATIONS:  You may resume your normal medications unless your doctor tells you otherwise.  WHAT TO EXPECT TODAY:  Some feelings of bloating in the abdomen.   Passage of more gas than usual.   Spotting of blood in your stool or on the toilet paper.  IF YOU HAD POLYPS REMOVED DURING THE COLONOSCOPY:  No aspirin products for 7 days or as instructed.   No alcohol for 7 days or as instructed.   Eat a soft diet for the next 24 hours.  FINDING OUT THE RESULTS OF YOUR TEST Not all test results are available during your visit. If your test results are not back during the visit, make an appointment with your caregiver to find out the results. Do not assume everything is normal if you have not heard from your caregiver or the medical facility. It is important for you to follow up on all of your test results.  SEEK  IMMEDIATE MEDICAL CARE IF:  You have more than a spotting of blood in your stool.   Your belly is swollen (abdominal distention).   You are nauseated or vomiting.   You have a fever.   You have abdominal pain or discomfort that is severe or gets worse throughout the day.  Document Released: 05/14/2004 Document Revised: 09/19/2011 Document Reviewed: 05/12/2008 [facility] Patient Information 2012 [place], [place].

## 2012-02-06 ENCOUNTER — Encounter (HOSPITAL_COMMUNITY): Payer: Self-pay | Admitting: General Surgery

## 2012-12-23 ENCOUNTER — Other Ambulatory Visit (HOSPITAL_COMMUNITY): Payer: Self-pay | Admitting: Nephrology

## 2012-12-23 DIAGNOSIS — N289 Disorder of kidney and ureter, unspecified: Secondary | ICD-10-CM

## 2012-12-31 ENCOUNTER — Ambulatory Visit (HOSPITAL_COMMUNITY)
Admission: RE | Admit: 2012-12-31 | Discharge: 2012-12-31 | Disposition: A | Discharge status: Home / Self Care | Payer: PRIVATE HEALTH INSURANCE | Source: Ambulatory Visit | Attending: Nephrology | Admitting: Nephrology

## 2012-12-31 DIAGNOSIS — N289 Disorder of kidney and ureter, unspecified: Secondary | ICD-10-CM | POA: Insufficient documentation

## 2013-01-15 ENCOUNTER — Other Ambulatory Visit (HOSPITAL_COMMUNITY): Payer: Self-pay | Admitting: Family Medicine

## 2013-01-15 DIAGNOSIS — G43809 Other migraine, not intractable, without status migrainosus: Secondary | ICD-10-CM

## 2013-01-19 ENCOUNTER — Other Ambulatory Visit (HOSPITAL_COMMUNITY): Payer: PRIVATE HEALTH INSURANCE

## 2013-01-27 ENCOUNTER — Other Ambulatory Visit (HOSPITAL_COMMUNITY): Payer: Self-pay | Admitting: Family Medicine

## 2013-01-27 ENCOUNTER — Encounter (HOSPITAL_COMMUNITY): Payer: Self-pay

## 2013-01-27 ENCOUNTER — Ambulatory Visit (HOSPITAL_COMMUNITY)
Admission: RE | Admit: 2013-01-27 | Discharge: 2013-01-27 | Disposition: A | Discharge status: Home / Self Care | Payer: PRIVATE HEALTH INSURANCE | Source: Ambulatory Visit | Attending: Family Medicine | Admitting: Family Medicine

## 2013-01-27 DIAGNOSIS — I10 Essential (primary) hypertension: Secondary | ICD-10-CM | POA: Insufficient documentation

## 2013-01-27 DIAGNOSIS — G43809 Other migraine, not intractable, without status migrainosus: Secondary | ICD-10-CM

## 2013-01-27 DIAGNOSIS — H538 Other visual disturbances: Secondary | ICD-10-CM | POA: Insufficient documentation

## 2013-01-27 DIAGNOSIS — G43909 Migraine, unspecified, not intractable, without status migrainosus: Secondary | ICD-10-CM | POA: Insufficient documentation

## 2013-01-27 MED ORDER — GADOBENATE DIMEGLUMINE 529 MG/ML IV SOLN
20.0000 mL | Freq: Once | INTRAVENOUS | Status: AC | PRN
  Administered 2013-01-27: 20 mL via INTRAVENOUS

## 2013-02-11 ENCOUNTER — Other Ambulatory Visit (HOSPITAL_COMMUNITY): Payer: PRIVATE HEALTH INSURANCE

## 2013-02-12 ENCOUNTER — Ambulatory Visit (HOSPITAL_COMMUNITY)
Admission: RE | Admit: 2013-02-12 | Discharge: 2013-02-12 | Disposition: A | Discharge status: Home / Self Care | Payer: PRIVATE HEALTH INSURANCE | Source: Ambulatory Visit | Attending: Cardiovascular Disease | Admitting: Cardiovascular Disease

## 2013-02-12 DIAGNOSIS — G459 Transient cerebral ischemic attack, unspecified: Secondary | ICD-10-CM | POA: Insufficient documentation

## 2013-02-12 DIAGNOSIS — I10 Essential (primary) hypertension: Secondary | ICD-10-CM | POA: Insufficient documentation

## 2013-02-12 NOTE — Progress Notes (Signed)
*  PRELIMINARY RESULTS* Echocardiogram 2D Echocardiogram has been performed.  Conrad Anahuac 02/12/2013, 10:49 AM

## 2013-07-26 ENCOUNTER — Other Ambulatory Visit: Payer: Self-pay | Admitting: *Deleted

## 2013-07-26 MED ORDER — VITAMIN D (ERGOCALCIFEROL) 1.25 MG (50000 UNIT) PO CAPS: 50000.0000 [IU] | ORAL_CAPSULE | ORAL | Status: DC

## 2013-07-26 NOTE — Telephone Encounter (Signed)
Rx was sent to pharmacy electronically. 

## 2013-10-15 ENCOUNTER — Other Ambulatory Visit (HOSPITAL_COMMUNITY): Payer: Self-pay | Admitting: Physician Assistant

## 2013-10-15 DIAGNOSIS — M5412 Radiculopathy, cervical region: Secondary | ICD-10-CM

## 2013-10-15 DIAGNOSIS — M5030 Other cervical disc degeneration, unspecified cervical region: Secondary | ICD-10-CM

## 2013-10-20 ENCOUNTER — Other Ambulatory Visit (HOSPITAL_COMMUNITY): Payer: Self-pay | Admitting: Family Medicine

## 2013-10-20 ENCOUNTER — Other Ambulatory Visit (HOSPITAL_COMMUNITY): Payer: Self-pay | Admitting: Physician Assistant

## 2013-10-20 ENCOUNTER — Ambulatory Visit (HOSPITAL_COMMUNITY)
Admission: RE | Admit: 2013-10-20 | Discharge: 2013-10-20 | Disposition: A | Discharge status: Home / Self Care | Payer: PRIVATE HEALTH INSURANCE | Source: Ambulatory Visit | Attending: Family Medicine | Admitting: Family Medicine

## 2013-10-20 DIAGNOSIS — R059 Cough, unspecified: Secondary | ICD-10-CM

## 2013-10-20 DIAGNOSIS — R05 Cough: Secondary | ICD-10-CM

## 2013-10-20 DIAGNOSIS — M5030 Other cervical disc degeneration, unspecified cervical region: Secondary | ICD-10-CM

## 2013-10-20 DIAGNOSIS — R079 Chest pain, unspecified: Secondary | ICD-10-CM | POA: Insufficient documentation

## 2013-10-22 ENCOUNTER — Other Ambulatory Visit (HOSPITAL_COMMUNITY): Payer: Self-pay | Admitting: Physician Assistant

## 2013-10-22 ENCOUNTER — Ambulatory Visit (HOSPITAL_COMMUNITY)
Admission: RE | Admit: 2013-10-22 | Discharge: 2013-10-22 | Disposition: A | Discharge status: Home / Self Care | Payer: PRIVATE HEALTH INSURANCE | Source: Ambulatory Visit | Attending: Physician Assistant | Admitting: Physician Assistant

## 2013-10-22 DIAGNOSIS — M4802 Spinal stenosis, cervical region: Secondary | ICD-10-CM | POA: Insufficient documentation

## 2013-10-22 DIAGNOSIS — M5030 Other cervical disc degeneration, unspecified cervical region: Secondary | ICD-10-CM

## 2013-10-22 DIAGNOSIS — M47812 Spondylosis without myelopathy or radiculopathy, cervical region: Secondary | ICD-10-CM | POA: Insufficient documentation

## 2013-10-22 DIAGNOSIS — Z981 Arthrodesis status: Secondary | ICD-10-CM | POA: Insufficient documentation

## 2013-10-22 DIAGNOSIS — Q762 Congenital spondylolisthesis: Secondary | ICD-10-CM | POA: Insufficient documentation

## 2013-10-22 DIAGNOSIS — M79609 Pain in unspecified limb: Secondary | ICD-10-CM | POA: Insufficient documentation

## 2013-12-02 ENCOUNTER — Other Ambulatory Visit (HOSPITAL_COMMUNITY): Payer: Self-pay | Admitting: Neurosurgery

## 2013-12-02 DIAGNOSIS — M5412 Radiculopathy, cervical region: Secondary | ICD-10-CM

## 2013-12-08 ENCOUNTER — Ambulatory Visit (HOSPITAL_COMMUNITY): Payer: PRIVATE HEALTH INSURANCE

## 2014-05-20 ENCOUNTER — Other Ambulatory Visit: Payer: Self-pay | Admitting: Cardiovascular Disease

## 2014-05-24 NOTE — Telephone Encounter (Signed)
Rx was sent to pharmacy electronically. Last OV 01/2013 (Dr. Rollene Fare)

## 2014-11-22 ENCOUNTER — Ambulatory Visit (INDEPENDENT_AMBULATORY_CARE_PROVIDER_SITE_OTHER): Payer: PRIVATE HEALTH INSURANCE | Admitting: Urology

## 2014-11-22 DIAGNOSIS — N529 Male erectile dysfunction, unspecified: Secondary | ICD-10-CM

## 2014-11-22 DIAGNOSIS — R151 Fecal smearing: Secondary | ICD-10-CM

## 2014-11-22 DIAGNOSIS — R972 Elevated prostate specific antigen [PSA]: Secondary | ICD-10-CM

## 2014-11-22 DIAGNOSIS — N401 Enlarged prostate with lower urinary tract symptoms: Secondary | ICD-10-CM

## 2014-11-23 ENCOUNTER — Other Ambulatory Visit: Payer: Self-pay | Admitting: Urology

## 2014-11-23 DIAGNOSIS — R972 Elevated prostate specific antigen [PSA]: Secondary | ICD-10-CM

## 2014-12-14 ENCOUNTER — Other Ambulatory Visit: Payer: Self-pay | Admitting: Urology

## 2014-12-14 DIAGNOSIS — R972 Elevated prostate specific antigen [PSA]: Secondary | ICD-10-CM

## 2014-12-27 ENCOUNTER — Inpatient Hospital Stay (HOSPITAL_COMMUNITY): Admission: RE | Admit: 2014-12-27 | Payer: PRIVATE HEALTH INSURANCE | Source: Ambulatory Visit

## 2015-01-03 ENCOUNTER — Ambulatory Visit (HOSPITAL_COMMUNITY)
Admission: RE | Admit: 2015-01-03 | Discharge: 2015-01-03 | Disposition: A | Discharge status: Home / Self Care | Payer: PRIVATE HEALTH INSURANCE | Source: Ambulatory Visit | Attending: Urology | Admitting: Urology

## 2015-01-03 ENCOUNTER — Encounter (HOSPITAL_COMMUNITY): Payer: Self-pay

## 2015-01-03 DIAGNOSIS — R972 Elevated prostate specific antigen [PSA]: Secondary | ICD-10-CM | POA: Diagnosis not present

## 2015-01-03 MED ORDER — GENTAMICIN SULFATE 40 MG/ML IJ SOLN
160.0000 mg | Freq: Once | INTRAMUSCULAR | Status: AC
  Administered 2015-01-03: 160 mg via INTRAMUSCULAR

## 2015-01-03 MED ORDER — LIDOCAINE HCL (PF) 2 % IJ SOLN
INTRAMUSCULAR | Status: DC
Start: 2015-01-03 — End: 2015-01-04
  Filled 2015-01-03: qty 10

## 2015-01-03 MED ORDER — GENTAMICIN SULFATE 40 MG/ML IJ SOLN
INTRAMUSCULAR | Status: AC
  Administered 2015-01-03: 160 mg via INTRAMUSCULAR
  Filled 2015-01-03: qty 4

## 2015-01-03 NOTE — Sedation Documentation (Signed)
Pt tolerating procedure well without lidocaine.

## 2015-01-03 NOTE — Sedation Documentation (Signed)
Pt  tolerated procedure well.

## 2015-01-03 NOTE — Discharge Instructions (Signed)
Transrectal Ultrasound-Guided Biopsy °A transrectal ultrasound-guided biopsy is a procedure to remove samples of tissue from your prostate using ultrasound images to guide the procedure. The procedure is usually done to evaluate the prostate gland of men who have an elevated prostate-specific antigen (PSA). PSA is a blood test to screen for prostate cancer. The biopsy samples are taken to check for prostate cancer.  °LET YOUR HEALTH CARE PROVIDER KNOW ABOUT: °· Any allergies you have. °· All medicines you are taking, including vitamins, herbs, eye drops, creams, and over-the-counter medicines. °· Previous problems you or members of your family have had with the use of anesthetics. °· Any blood disorders you have. °· Previous surgeries you have had. °· Medical conditions you have. °RISKS AND COMPLICATIONS °Generally, this is a safe procedure. However, as with any procedure, problems can occur. Possible problems include: °· Infection of your prostate. °· Bleeding from your rectum or blood in your urine. °· Difficulty urinating. °· Nerve damage (this is usually temporary). °· Damage to surrounding structures such as blood vessels, organs, and muscles, which would require other procedures. °BEFORE THE PROCEDURE °· Do not eat or drink anything after midnight on the night before the procedure or as directed by your health care provider. °· Take medicines only as directed by your health care provider. °· Your health care provider may have you stop taking certain medicines 5-7 days before the procedure. °· You will be given an enema before the procedure. During an enema, a liquid is injected into your rectum to clear out waste. °· You may have lab tests the day of your procedure.   °· Plan to have someone take you home after the procedure. °PROCEDURE  °· You will be given medicine to help you relax (sedative) before the procedure. An IV tube will be inserted into one of your veins and used to give fluids and  medicine. °· You will be given antibiotic medicine to reduce the risk of an infection. °· You will be placed on your side for the procedure. °· A probe with lubricated gel will be placed into your rectum, and images will be taken of your prostate and surrounding structures. °· Numbing medicine will be injected into the prostate before the biopsy samples are taken. °· A biopsy needle will then be inserted and guided to your prostate with the use of the ultrasound images. °· Samples of prostate tissue will be taken, and the needle will then be removed. °· The biopsy samples will be sent to a lab to be analyzed. Results are usually back in 2-3 days. °AFTER THE PROCEDURE °· You will be taken to a recovery area where you will be monitored. °· You may have some discomfort in the rectal area. You will be given pain medicines to control this. °· You may be allowed to go home the same day, or you may need to stay in the hospital overnight. °Document Released: 02/14/2014 Document Reviewed: 05/19/2013 °[employer]® Patient Information ©2015 [employer]. This information is not intended to replace advice given to you by your health care provider. Make sure you discuss any questions you have with your health care provider. ° °

## 2015-01-03 NOTE — Sedation Documentation (Signed)
Pt states he is allergic to any type of numbing medications, states he get blisters around the site.

## 2015-01-03 NOTE — Sedation Documentation (Signed)
Labs labeled at bedside as collected.

## 2015-01-03 NOTE — Sedation Documentation (Signed)
Procedure will be performed without lidocaine.

## 2015-02-01 ENCOUNTER — Other Ambulatory Visit: Payer: Self-pay | Admitting: Urology

## 2015-02-01 DIAGNOSIS — C61 Malignant neoplasm of prostate: Secondary | ICD-10-CM

## 2015-02-08 ENCOUNTER — Encounter (HOSPITAL_COMMUNITY): Payer: Self-pay

## 2015-02-08 ENCOUNTER — Encounter (HOSPITAL_COMMUNITY)
Admission: RE | Admit: 2015-02-08 | Discharge: 2015-02-08 | Disposition: A | Discharge status: Home / Self Care | Payer: PRIVATE HEALTH INSURANCE | Source: Ambulatory Visit | Attending: Urology | Admitting: Urology

## 2015-02-08 ENCOUNTER — Ambulatory Visit (HOSPITAL_COMMUNITY)
Admission: RE | Admit: 2015-02-08 | Discharge: 2015-02-08 | Disposition: A | Discharge status: Home / Self Care | Payer: PRIVATE HEALTH INSURANCE | Source: Ambulatory Visit | Attending: Urology | Admitting: Urology

## 2015-02-08 DIAGNOSIS — K5790 Diverticulosis of intestine, part unspecified, without perforation or abscess without bleeding: Secondary | ICD-10-CM | POA: Diagnosis not present

## 2015-02-08 DIAGNOSIS — C61 Malignant neoplasm of prostate: Secondary | ICD-10-CM | POA: Insufficient documentation

## 2015-02-08 HISTORY — DX: Malignant (primary) neoplasm, unspecified: C80.1

## 2015-02-08 LAB — POCT I-STAT CREATININE: CREATININE: 1.4 mg/dL — AB (ref 0.50–1.35)

## 2015-02-08 MED ORDER — TECHNETIUM TC 99M MEDRONATE IV KIT
25.0000 | PACK | Freq: Once | INTRAVENOUS | Status: AC | PRN
  Administered 2015-02-08: 26 via INTRAVENOUS

## 2015-02-08 MED ORDER — IOHEXOL 300 MG/ML  SOLN
100.0000 mL | Freq: Once | INTRAMUSCULAR | Status: AC | PRN
  Administered 2015-02-08: 100 mL via INTRAVENOUS

## 2015-02-23 ENCOUNTER — Other Ambulatory Visit: Payer: Self-pay | Admitting: Urology

## 2015-03-17 NOTE — Patient Instructions (Signed)
Mark Cannon  03/17/2015   Your procedure is scheduled on:    03/22/2015    Report to [facility] Main  Entrance and follow signs to               [place] at     [place].  Call this number if you have problems the morning of surgery [phone]   Remember: ONLY 1 PERSON MAY GO WITH YOU TO SHORT STAY TO GET  READY MORNING OF [place].  Do not eat food or drink liquids :After Midnight.     Take these medicines the morning of surgery with A SIP OF WATER: Zantac if needed                               You may not have any metal on your body including hair pins and              piercings  Do not wear jewelry, , lotions, powders or perfumes, deodorant                         Men may shave face and neck.   Do not bring valuables to the hospital. [place].  Contacts, dentures or bridgework may not be worn into surgery.  Leave suitcase in the car. After surgery it may be brought to your room.         Special Instructions: coughing and deep breathing exercises               Please read over the following fact sheets you were given: _____________________________________________________________________             [facility] - Preparing for Surgery Before surgery, you can play an important role.  Because skin is not sterile, your skin needs to be as free of germs as possible.  You can reduce the number of germs on your skin by washing with CHG (chlorahexidine gluconate) soap before surgery.  CHG is an antiseptic cleaner which kills germs and bonds with the skin to continue killing germs even after washing. Please DO NOT use if you have an allergy to CHG or antibacterial soaps.  If your skin becomes reddened/irritated stop using the CHG and inform your nurse when you arrive at Short Stay. Do not shave (including legs and underarms) for at least 48 hours prior to the first CHG shower.  You may shave your  face/neck. Please follow these instructions carefully:  1.  Shower with CHG Soap the night before surgery and the  morning of Surgery.  2.  If you choose to wash your hair, wash your hair first as usual with your  normal  shampoo.  3.  After you shampoo, rinse your hair and body thoroughly to remove the  shampoo.                           4.  Use CHG as you would any other liquid soap.  You can apply chg directly  to the skin and wash                       Gently  with a scrungie or clean washcloth.  5.  Apply the CHG Soap to your body ONLY FROM THE NECK DOWN.   Do not use on face/ open                           Wound or open sores. Avoid contact with eyes, ears mouth and genitals (private parts).                       Wash face,  Genitals (private parts) with your normal soap.             6.  Wash thoroughly, paying special attention to the area where your surgery  will be performed.  7.  Thoroughly rinse your body with warm water from the neck down.  8.  DO NOT shower/wash with your normal soap after using and rinsing off  the CHG Soap.                9.  Pat yourself dry with a clean towel.            10.  Wear clean pajamas.            11.  Place clean sheets on your bed the night of your first shower and do not  sleep with pets. Day of Surgery : Do not apply any lotions/deodorants the morning of surgery.  Please wear clean clothes to the hospital/surgery center.  FAILURE TO FOLLOW THESE INSTRUCTIONS MAY RESULT IN THE CANCELLATION OF YOUR SURGERY PATIENT SIGNATURE_________________________________  NURSE SIGNATURE__________________________________  ________________________________________________________________________

## 2015-03-20 ENCOUNTER — Encounter (HOSPITAL_COMMUNITY)
Admission: RE | Admit: 2015-03-20 | Discharge: 2015-03-20 | Disposition: A | Discharge status: Home / Self Care | Payer: PRIVATE HEALTH INSURANCE | Source: Ambulatory Visit | Attending: Urology | Admitting: Urology

## 2015-03-20 ENCOUNTER — Encounter (HOSPITAL_COMMUNITY): Payer: Self-pay

## 2015-03-20 HISTORY — DX: Gastro-esophageal reflux disease without esophagitis: K21.9

## 2015-03-20 HISTORY — DX: Partial loss of teeth, unspecified cause, unspecified class: K08.409

## 2015-03-20 HISTORY — DX: Unspecified osteoarthritis, unspecified site: M19.90

## 2015-03-20 LAB — BASIC METABOLIC PANEL
ANION GAP: 8 (ref 5–15)
BUN: 29 mg/dL — AB (ref 6–20)
CO2: 33 mmol/L — ABNORMAL HIGH (ref 22–32)
Calcium: 9.2 mg/dL (ref 8.9–10.3)
Chloride: 99 mmol/L — ABNORMAL LOW (ref 101–111)
Creatinine, Ser: 1.53 mg/dL — ABNORMAL HIGH (ref 0.61–1.24)
GFR calc Af Amer: 54 mL/min — ABNORMAL LOW (ref >60)
GFR calc non Af Amer: 46 mL/min — ABNORMAL LOW (ref >60)
GLUCOSE: 134 mg/dL — AB (ref 65–99)
Potassium: 4.1 mmol/L (ref 3.5–5.1)
Sodium: 140 mmol/L (ref 135–145)

## 2015-03-20 LAB — CBC
HCT: 48.1 % (ref 39.0–52.0)
HEMOGLOBIN: 16.2 g/dL (ref 13.0–17.0)
MCH: 30.5 pg (ref 26.0–34.0)
MCHC: 33.7 g/dL (ref 30.0–36.0)
MCV: 90.6 fL (ref 78.0–100.0)
PLATELETS: 236 10*3/uL (ref 150–400)
RBC: 5.31 MIL/uL (ref 4.22–5.81)
RDW: 13.5 % (ref 11.5–15.5)
WBC: 13.3 10*3/uL — AB (ref 4.0–10.5)

## 2015-03-20 LAB — ABO/RH: ABO/RH(D): A POS

## 2015-03-20 NOTE — Progress Notes (Addendum)
Patient had tooth pulled week of 03/15/2015 per patient.  Patient states stitches still in.  Instructed patient to inform Dr Tresa Moore prior to surgery.  White count elevated at preop appointment.  Faxed via EPIC to Dr Tresa Moore.  Called office of Dr Tresa Moore and spoke with Tera ( Triage Nurse) and informed her that patient had tooth pulled last week and still has stitches and that white count was 13/3 on preop labs done 03/20/2015 and that CBC results had been faxed to Dr Tresa Moore.  Tera stated she would get message to Dr Tresa Moore.

## 2015-03-20 NOTE — Progress Notes (Signed)
EKG 01/04/2015 on chart

## 2015-03-20 NOTE — Progress Notes (Signed)
   03/20/15 1056  OBSTRUCTIVE SLEEP APNEA  Have you ever been diagnosed with sleep apnea through a sleep study? No  Do you snore loudly (loud enough to be heard through closed doors)?  0  Do you often feel tired, fatigued, or sleepy during the daytime? 1  Has anyone observed you stop breathing during your sleep? 1  Do you have, or are you being treated for high blood pressure? 1  BMI more than 35 kg/m2? 0  Age over 64 years old? 1  Neck circumference greater than 40 cm/16 inches? 0  Gender: 1  Obstructive Sleep Apnea Score 5

## 2015-03-20 NOTE — Progress Notes (Signed)
02/12/2013- ECHO- EPIC  OV note- Delman Cheadle PA on chart 01/04/2015   EKG- with birthdate but no name - called on 03/17/15 and 03/20/15 for name to be placed on EKG and refaxed.

## 2015-03-20 NOTE — Progress Notes (Signed)
BMP results done 03/20/2015 faxed via EPIC to Dr Tresa Moore.

## 2015-03-21 NOTE — Anesthesia Preprocedure Evaluation (Addendum)
Anesthesia Evaluation  Patient identified by MRN, date of birth, ID band Patient awake    Reviewed: Allergy & Precautions, H&P , NPO status , Patient's Chart, lab work & pertinent test results  Airway Mallampati: II  TM Distance: >3 FB Neck ROM: full    Dental  (+) Caps, Dental Advisory Given Cap left front upper:   Pulmonary neg pulmonary ROS, Current Smoker,  breath sounds clear to auscultation  Pulmonary exam normal       Cardiovascular Exercise Tolerance: Good hypertension, Pt. on medications negative cardio ROS Normal cardiovascular examRhythm:regular Rate:Normal     Neuro/Psych negative neurological ROS  negative psych ROS   GI/Hepatic negative GI ROS, Neg liver ROS, GERD-  Medicated and Controlled,  Endo/Other  negative endocrine ROS  Renal/GU negative Renal ROSCRT 1.53  negative genitourinary   Musculoskeletal   Abdominal   Peds  Hematology negative hematology ROS (+)   Anesthesia Other Findings   Reproductive/Obstetrics negative OB ROS                            Anesthesia Physical Anesthesia Plan  ASA: II  Anesthesia Plan: General   Post-op Pain Management:    Induction: Intravenous  Airway Management Planned: Oral ETT  Additional Equipment:   Intra-op Plan:   Post-operative Plan: Extubation in OR  Informed Consent: I have reviewed the patients History and Physical, chart, labs and discussed the procedure including the risks, benefits and alternatives for the proposed anesthesia with the patient or authorized representative who has indicated his/her understanding and acceptance.   Dental Advisory Given  Plan Discussed with: CRNA and Surgeon  Anesthesia Plan Comments:         Anesthesia Quick Evaluation

## 2015-03-22 ENCOUNTER — Encounter (HOSPITAL_COMMUNITY)
Admission: RE | Disposition: A | Discharge status: Home / Self Care | Payer: Self-pay | Source: Ambulatory Visit | Attending: Urology

## 2015-03-22 ENCOUNTER — Encounter (HOSPITAL_COMMUNITY): Payer: Self-pay | Admitting: Anesthesiology

## 2015-03-22 ENCOUNTER — Inpatient Hospital Stay (HOSPITAL_COMMUNITY)
Admission: RE | Admit: 2015-03-22 | Discharge: 2015-03-24 | DRG: 708 | Disposition: A | Discharge status: Home / Self Care | Payer: PRIVATE HEALTH INSURANCE | Source: Ambulatory Visit | Attending: Urology | Admitting: Urology

## 2015-03-22 ENCOUNTER — Inpatient Hospital Stay (HOSPITAL_COMMUNITY): Payer: PRIVATE HEALTH INSURANCE | Admitting: Anesthesiology

## 2015-03-22 DIAGNOSIS — M1990 Unspecified osteoarthritis, unspecified site: Secondary | ICD-10-CM | POA: Diagnosis present

## 2015-03-22 DIAGNOSIS — Z8546 Personal history of malignant neoplasm of prostate: Secondary | ICD-10-CM | POA: Diagnosis present

## 2015-03-22 DIAGNOSIS — E780 Pure hypercholesterolemia: Secondary | ICD-10-CM | POA: Diagnosis present

## 2015-03-22 DIAGNOSIS — Z86010 Personal history of colonic polyps: Secondary | ICD-10-CM

## 2015-03-22 DIAGNOSIS — Z9049 Acquired absence of other specified parts of digestive tract: Secondary | ICD-10-CM | POA: Diagnosis present

## 2015-03-22 DIAGNOSIS — R202 Paresthesia of skin: Secondary | ICD-10-CM | POA: Diagnosis not present

## 2015-03-22 DIAGNOSIS — J449 Chronic obstructive pulmonary disease, unspecified: Secondary | ICD-10-CM | POA: Diagnosis present

## 2015-03-22 DIAGNOSIS — N281 Cyst of kidney, acquired: Secondary | ICD-10-CM | POA: Diagnosis present

## 2015-03-22 DIAGNOSIS — Z888 Allergy status to other drugs, medicaments and biological substances status: Secondary | ICD-10-CM | POA: Diagnosis not present

## 2015-03-22 DIAGNOSIS — I10 Essential (primary) hypertension: Secondary | ICD-10-CM | POA: Diagnosis present

## 2015-03-22 DIAGNOSIS — C61 Malignant neoplasm of prostate: Secondary | ICD-10-CM | POA: Diagnosis present

## 2015-03-22 DIAGNOSIS — F17210 Nicotine dependence, cigarettes, uncomplicated: Secondary | ICD-10-CM | POA: Diagnosis present

## 2015-03-22 DIAGNOSIS — K219 Gastro-esophageal reflux disease without esophagitis: Secondary | ICD-10-CM | POA: Diagnosis present

## 2015-03-22 DIAGNOSIS — R32 Unspecified urinary incontinence: Secondary | ICD-10-CM | POA: Diagnosis not present

## 2015-03-22 DIAGNOSIS — K660 Peritoneal adhesions (postprocedural) (postinfection): Secondary | ICD-10-CM | POA: Diagnosis present

## 2015-03-22 HISTORY — PX: ROBOTIC ASSISTED LAPAROSCOPIC LYSIS OF ADHESION: SHX6080

## 2015-03-22 HISTORY — PX: LYMPHADENECTOMY: SHX5960

## 2015-03-22 HISTORY — PX: ROBOT ASSISTED LAPAROSCOPIC RADICAL PROSTATECTOMY: SHX5141

## 2015-03-22 LAB — HEMOGLOBIN AND HEMATOCRIT, BLOOD
HCT: 46.8 % (ref 39.0–52.0)
Hemoglobin: 15.1 g/dL (ref 13.0–17.0)

## 2015-03-22 LAB — TYPE AND SCREEN
ABO/RH(D): A POS
Antibody Screen: NEGATIVE

## 2015-03-22 SURGERY — ROBOTIC ASSISTED LAPAROSCOPIC RADICAL PROSTATECTOMY
Anesthesia: General | Site: Abdomen

## 2015-03-22 MED ORDER — OXYCODONE HCL 5 MG PO TABS
5.0000 mg | ORAL_TABLET | ORAL | Status: DC | PRN
  Administered 2015-03-22 – 2015-03-24 (×7): 5 mg via ORAL
  Filled 2015-03-22 (×7): qty 1

## 2015-03-22 MED ORDER — DEXAMETHASONE SODIUM PHOSPHATE 10 MG/ML IJ SOLN
INTRAMUSCULAR | Status: AC
  Filled 2015-03-22: qty 1

## 2015-03-22 MED ORDER — SULFAMETHOXAZOLE-TRIMETHOPRIM 800-160 MG PO TABS: 1.0000 | ORAL_TABLET | Freq: Two times a day (BID) | ORAL | Status: DC

## 2015-03-22 MED ORDER — LACTATED RINGERS IV SOLN: INTRAVENOUS | Status: DC

## 2015-03-22 MED ORDER — ONDANSETRON HCL 4 MG/2ML IJ SOLN
INTRAMUSCULAR | Status: DC | PRN
  Administered 2015-03-22: 4 mg via INTRAVENOUS

## 2015-03-22 MED ORDER — HYDROMORPHONE HCL 2 MG/ML IJ SOLN
INTRAMUSCULAR | Status: AC
  Filled 2015-03-22: qty 1

## 2015-03-22 MED ORDER — SUFENTANIL CITRATE 50 MCG/ML IV SOLN
INTRAVENOUS | Status: AC
Start: 2015-03-22 — End: 2015-03-22
  Filled 2015-03-22: qty 1

## 2015-03-22 MED ORDER — HYDROMORPHONE HCL 1 MG/ML IJ SOLN
0.5000 mg | INTRAMUSCULAR | Status: DC | PRN
  Administered 2015-03-22: 0.5 mg via INTRAVENOUS
  Administered 2015-03-23: 1 mg via INTRAVENOUS
  Administered 2015-03-23: 0.5 mg via INTRAVENOUS
  Administered 2015-03-23 (×2): 1 mg via INTRAVENOUS
  Administered 2015-03-23: 0.5 mg via INTRAVENOUS
  Administered 2015-03-24 (×2): 1 mg via INTRAVENOUS
  Filled 2015-03-22 (×9): qty 1

## 2015-03-22 MED ORDER — GLYCOPYRROLATE 0.2 MG/ML IJ SOLN
INTRAMUSCULAR | Status: DC | PRN
  Administered 2015-03-22: 0.6 mg via INTRAVENOUS

## 2015-03-22 MED ORDER — HYDROMORPHONE HCL 1 MG/ML IJ SOLN
0.2500 mg | INTRAMUSCULAR | Status: DC | PRN
  Administered 2015-03-22: 0.5 mg via INTRAVENOUS
  Administered 2015-03-22: 0.25 mg via INTRAVENOUS

## 2015-03-22 MED ORDER — MIDAZOLAM HCL 5 MG/5ML IJ SOLN
INTRAMUSCULAR | Status: DC | PRN
  Administered 2015-03-22: 2 mg via INTRAVENOUS

## 2015-03-22 MED ORDER — FAMOTIDINE 20 MG PO TABS
20.0000 mg | ORAL_TABLET | Freq: Every day | ORAL | Status: DC
Start: 2015-03-22 — End: 2015-03-24
  Administered 2015-03-22 – 2015-03-24 (×3): 20 mg via ORAL
  Filled 2015-03-22 (×5): qty 1

## 2015-03-22 MED ORDER — CEFAZOLIN SODIUM-DEXTROSE 2-3 GM-% IV SOLR
INTRAVENOUS | Status: AC
  Filled 2015-03-22: qty 50

## 2015-03-22 MED ORDER — LISINOPRIL 20 MG PO TABS
20.0000 mg | ORAL_TABLET | Freq: Every day | ORAL | Status: DC
  Administered 2015-03-22 – 2015-03-24 (×3): 20 mg via ORAL
  Filled 2015-03-22 (×3): qty 1

## 2015-03-22 MED ORDER — CEFAZOLIN SODIUM-DEXTROSE 2-3 GM-% IV SOLR
2.0000 g | INTRAVENOUS | Status: AC
  Administered 2015-03-22: 2 g via INTRAVENOUS

## 2015-03-22 MED ORDER — SODIUM CHLORIDE 0.9 % IR SOLN
Status: DC | PRN
  Administered 2015-03-22: 1000 mL via INTRAVESICAL

## 2015-03-22 MED ORDER — SUCCINYLCHOLINE CHLORIDE 20 MG/ML IJ SOLN
INTRAMUSCULAR | Status: DC | PRN
  Administered 2015-03-22: 100 mg via INTRAVENOUS

## 2015-03-22 MED ORDER — CISATRACURIUM BESYLATE (PF) 10 MG/5ML IV SOLN
INTRAVENOUS | Status: DC | PRN
  Administered 2015-03-22: 6 mg via INTRAVENOUS
  Administered 2015-03-22: 4 mg via INTRAVENOUS
  Administered 2015-03-22: 2 mg via INTRAVENOUS
  Administered 2015-03-22: 14 mg via INTRAVENOUS
  Administered 2015-03-22: 4 mg via INTRAVENOUS

## 2015-03-22 MED ORDER — PROPOFOL 10 MG/ML IV BOLUS
INTRAVENOUS | Status: DC | PRN
  Administered 2015-03-22: 200 mg via INTRAVENOUS

## 2015-03-22 MED ORDER — LISINOPRIL-HYDROCHLOROTHIAZIDE 20-25 MG PO TABS: 1.0000 | ORAL_TABLET | Freq: Every day | ORAL | Status: DC

## 2015-03-22 MED ORDER — SODIUM CHLORIDE 0.9 % IJ SOLN
INTRAMUSCULAR | Status: AC
  Filled 2015-03-22: qty 10

## 2015-03-22 MED ORDER — CISATRACURIUM BESYLATE 20 MG/10ML IV SOLN
INTRAVENOUS | Status: AC
  Filled 2015-03-22: qty 10

## 2015-03-22 MED ORDER — DEXAMETHASONE SODIUM PHOSPHATE 10 MG/ML IJ SOLN
INTRAMUSCULAR | Status: DC | PRN
  Administered 2015-03-22: 10 mg via INTRAVENOUS

## 2015-03-22 MED ORDER — HYDROMORPHONE HCL 1 MG/ML IJ SOLN
INTRAMUSCULAR | Status: AC
  Filled 2015-03-22: qty 1

## 2015-03-22 MED ORDER — HYDROMORPHONE HCL 1 MG/ML IJ SOLN
INTRAMUSCULAR | Status: DC | PRN
  Administered 2015-03-22 (×4): .4 mg via INTRAVENOUS

## 2015-03-22 MED ORDER — PHENYLEPHRINE 40 MCG/ML (10ML) SYRINGE FOR IV PUSH (FOR BLOOD PRESSURE SUPPORT)
PREFILLED_SYRINGE | INTRAVENOUS | Status: AC
  Filled 2015-03-22: qty 10

## 2015-03-22 MED ORDER — ALBUTEROL SULFATE HFA 108 (90 BASE) MCG/ACT IN AERS
INHALATION_SPRAY | RESPIRATORY_TRACT | Status: AC
  Filled 2015-03-22: qty 6.7

## 2015-03-22 MED ORDER — LIDOCAINE HCL (CARDIAC) 20 MG/ML IV SOLN
INTRAVENOUS | Status: AC
Start: 2015-03-22 — End: 2015-03-22
  Filled 2015-03-22: qty 5

## 2015-03-22 MED ORDER — MIDAZOLAM HCL 2 MG/2ML IJ SOLN
INTRAMUSCULAR | Status: AC
  Filled 2015-03-22: qty 2

## 2015-03-22 MED ORDER — LACTATED RINGERS IV SOLN
INTRAVENOUS | Status: DC | PRN
  Administered 2015-03-22 (×3): via INTRAVENOUS

## 2015-03-22 MED ORDER — PHENYLEPHRINE HCL 10 MG/ML IJ SOLN
INTRAMUSCULAR | Status: DC | PRN
  Administered 2015-03-22 (×4): 80 ug via INTRAVENOUS

## 2015-03-22 MED ORDER — SUFENTANIL CITRATE 50 MCG/ML IV SOLN
INTRAVENOUS | Status: DC | PRN
  Administered 2015-03-22 (×2): 10 ug via INTRAVENOUS
  Administered 2015-03-22: 20 ug via INTRAVENOUS
  Administered 2015-03-22: 10 ug via INTRAVENOUS

## 2015-03-22 MED ORDER — DEXTROSE-NACL 5-0.45 % IV SOLN
INTRAVENOUS | Status: DC
  Administered 2015-03-22 – 2015-03-23 (×4): via INTRAVENOUS

## 2015-03-22 MED ORDER — HYDROCHLOROTHIAZIDE 25 MG PO TABS
25.0000 mg | ORAL_TABLET | Freq: Every day | ORAL | Status: DC
  Administered 2015-03-22 – 2015-03-24 (×3): 25 mg via ORAL
  Filled 2015-03-22 (×3): qty 1

## 2015-03-22 MED ORDER — NEOSTIGMINE METHYLSULFATE 10 MG/10ML IV SOLN
INTRAVENOUS | Status: DC | PRN
  Administered 2015-03-22: 4 mg via INTRAVENOUS

## 2015-03-22 MED ORDER — HYDROCODONE-ACETAMINOPHEN 5-325 MG PO TABS: 1.0000 | ORAL_TABLET | Freq: Four times a day (QID) | ORAL | Status: DC | PRN

## 2015-03-22 MED ORDER — SODIUM CHLORIDE 0.9 % IV BOLUS (SEPSIS)
1000.0000 mL | Freq: Once | INTRAVENOUS | Status: AC
  Administered 2015-03-22: 1000 mL via INTRAVENOUS

## 2015-03-22 MED ORDER — PROPOFOL 10 MG/ML IV BOLUS
INTRAVENOUS | Status: AC
  Filled 2015-03-22: qty 20

## 2015-03-22 MED ORDER — GLYCOPYRROLATE 0.2 MG/ML IJ SOLN
INTRAMUSCULAR | Status: AC
Start: 2015-03-22 — End: 2015-03-22
  Filled 2015-03-22: qty 3

## 2015-03-22 MED ORDER — ONDANSETRON HCL 4 MG/2ML IJ SOLN
INTRAMUSCULAR | Status: AC
  Filled 2015-03-22: qty 2

## 2015-03-22 MED ORDER — ACETAMINOPHEN 500 MG PO TABS
1000.0000 mg | ORAL_TABLET | Freq: Four times a day (QID) | ORAL | Status: AC
  Administered 2015-03-22 – 2015-03-23 (×4): 1000 mg via ORAL
  Filled 2015-03-22 (×8): qty 2

## 2015-03-22 MED ORDER — LACTATED RINGERS IR SOLN
Status: DC | PRN
  Administered 2015-03-22: 1000 mL

## 2015-03-22 MED ORDER — ALBUTEROL SULFATE HFA 108 (90 BASE) MCG/ACT IN AERS
INHALATION_SPRAY | RESPIRATORY_TRACT | Status: DC | PRN
  Administered 2015-03-22 (×2): 7 via RESPIRATORY_TRACT

## 2015-03-22 SURGICAL SUPPLY — 57 items
CABLE HIGH FREQUENCY MONO STRZ (ELECTRODE) ×5 IMPLANT
CATH FOLEY 2WAY SLVR 18FR 30CC (CATHETERS) ×5 IMPLANT
CATH TIEMANN FOLEY 18FR 5CC (CATHETERS) ×5 IMPLANT
CHLORAPREP W/TINT 26ML (MISCELLANEOUS) ×5 IMPLANT
CLIP LIGATING HEM O LOK PURPLE (MISCELLANEOUS) ×10 IMPLANT
CLOTH BEACON ORANGE TIMEOUT ST (SAFETY) ×5 IMPLANT
CONT SPEC 4OZ CLIKSEAL STRL BL (MISCELLANEOUS) ×5 IMPLANT
COVER SURGICAL LIGHT HANDLE (MISCELLANEOUS) ×5 IMPLANT
COVER TIP SHEARS 8 DVNC (MISCELLANEOUS) ×3 IMPLANT
COVER TIP SHEARS 8MM DA VINCI (MISCELLANEOUS) ×2
CUTTER ECHEON FLEX ENDO 45 340 (ENDOMECHANICALS) ×5 IMPLANT
DECANTER SPIKE VIAL GLASS SM (MISCELLANEOUS) ×3 IMPLANT
DRSG TEGADERM 4X4.75 (GAUZE/BANDAGES/DRESSINGS) ×10 IMPLANT
DRSG TEGADERM 6X8 (GAUZE/BANDAGES/DRESSINGS) ×6 IMPLANT
ELECT REM PT RETURN 9FT ADLT (ELECTROSURGICAL) ×5
ELECTRODE REM PT RTRN 9FT ADLT (ELECTROSURGICAL) ×3 IMPLANT
GAUZE SPONGE 2X2 8PLY STRL LF (GAUZE/BANDAGES/DRESSINGS) ×3 IMPLANT
GLOVE BIO SURGEON STRL SZ 6.5 (GLOVE) ×4 IMPLANT
GLOVE BIO SURGEONS STRL SZ 6.5 (GLOVE) ×1
GLOVE BIOGEL M STRL SZ7.5 (GLOVE) ×10 IMPLANT
GLOVE BIOGEL PI IND STRL 7.5 (GLOVE) ×3 IMPLANT
GLOVE BIOGEL PI INDICATOR 7.5 (GLOVE) ×2
GOWN STRL REUS W/TWL LRG LVL3 (GOWN DISPOSABLE) ×10 IMPLANT
GOWN STRL REUS W/TWL LRG LVL4 (GOWN DISPOSABLE) ×15 IMPLANT
HOLDER FOLEY CATH W/STRAP (MISCELLANEOUS) ×5 IMPLANT
IV LACTATED RINGERS 1000ML (IV SOLUTION) ×5 IMPLANT
KIT ACCESSORY DA VINCI DISP (KITS) ×2
KIT ACCESSORY DVNC DISP (KITS) ×3 IMPLANT
KIT PROCEDURE DA VINCI SI (MISCELLANEOUS) ×2
KIT PROCEDURE DVNC SI (MISCELLANEOUS) ×3 IMPLANT
LIQUID BAND (GAUZE/BANDAGES/DRESSINGS) ×5 IMPLANT
NDL INSUFFLATION 14GA 120MM (NEEDLE) ×3 IMPLANT
NDL SPNL 22GX7 QUINCKE BK (NEEDLE) IMPLANT
NEEDLE INSUFFLATION 14GA 120MM (NEEDLE) ×5 IMPLANT
NEEDLE SPNL 22GX7 QUINCKE BK (NEEDLE) ×5 IMPLANT
PACK ROBOT UROLOGY CUSTOM (CUSTOM PROCEDURE TRAY) ×5 IMPLANT
PAD POSITIONING PINK XL (MISCELLANEOUS) IMPLANT
RELOAD GREEN ECHELON 45 (STAPLE) ×5 IMPLANT
SET IRRIG Y TYPE TUR BLADDER L (SET/KITS/TRAYS/PACK) ×2 IMPLANT
SET TUBE IRRIG SUCTION NO TIP (IRRIGATION / IRRIGATOR) ×5 IMPLANT
SHEET LAVH (DRAPES) IMPLANT
SOLUTION ELECTROLUBE (MISCELLANEOUS) ×5 IMPLANT
SPONGE GAUZE 2X2 STER 10/PKG (GAUZE/BANDAGES/DRESSINGS)
SPONGE LAP 4X18 X RAY DECT (DISPOSABLE) ×5 IMPLANT
SUT ETHILON 3 0 PS 1 (SUTURE) ×5 IMPLANT
SUT MNCRL AB 4-0 PS2 18 (SUTURE) ×10 IMPLANT
SUT PDS AB 1 CT1 27 (SUTURE) ×10 IMPLANT
SUT VIC AB 2-0 SH 27 (SUTURE) ×5
SUT VIC AB 2-0 SH 27X BRD (SUTURE) ×3 IMPLANT
SUT VICRYL 0 UR6 27IN ABS (SUTURE) ×5 IMPLANT
SUT VLOC BARB 180 ABS3/0GR12 (SUTURE) ×15
SUTURE VLOC BRB 180 ABS3/0GR12 (SUTURE) ×9 IMPLANT
SYR 27GX1/2 1ML LL SAFETY (SYRINGE) ×3 IMPLANT
TOWEL OR 17X26 10 PK STRL BLUE (TOWEL DISPOSABLE) ×5 IMPLANT
TOWEL OR NON WOVEN STRL DISP B (DISPOSABLE) ×5 IMPLANT
TROCAR 12M 150ML BLUNT (TROCAR) ×5 IMPLANT
WATER STERILE IRR 1500ML POUR (IV SOLUTION) ×6 IMPLANT

## 2015-03-22 NOTE — Transfer of Care (Signed)
Immediate Anesthesia Transfer of Care Note  Patient: Mark Cannon  Procedure(s) Performed: Procedure(s): ROBOTIC ASSISTED LAPAROSCOPIC RADICAL PROSTATECTOMY WITH INDOCYANINE GREEN DYE INJECTION (N/A) PELVIC LYMPHADENECTOMY (Bilateral) ROBOTIC ASSISTED LAPAROSCOPIC LYSIS OF ADHESION  Patient Location: PACU  Anesthesia Type:General  Level of Consciousness: sedated  Airway & Oxygen Therapy: Patient Spontanous Breathing and Patient connected to face mask oxygen  Post-op Assessment: Report given to RN and Post -op Vital signs reviewed and stable  Post vital signs: Reviewed and stable  Last Vitals:  Filed Vitals:   03/22/15 0614  BP: 115/79  Pulse: 90  Temp: 37.1 C  Resp: 18    Complications: No apparent anesthesia complications

## 2015-03-22 NOTE — Anesthesia Procedure Notes (Signed)
Procedure Name: Intubation Date/Time: 03/22/2015 8:38 AM Performed by: Danley Danker L Patient Re-evaluated:Patient Re-evaluated prior to inductionOxygen Delivery Method: Circle system utilized Preoxygenation: Pre-oxygenation with 100% oxygen Intubation Type: IV induction Ventilation: Mask ventilation without difficulty and Oral airway inserted - appropriate to patient size Laryngoscope Size: Miller and 3 Grade View: Grade I Tube type: Oral Tube size: 8.0 mm Number of attempts: 1 Airway Equipment and Method: Stylet Placement Confirmation: ETT inserted through vocal cords under direct vision,  positive ETCO2 and breath sounds checked- equal and bilateral Secured at: 22 cm Tube secured with: Tape Dental Injury: Teeth and Oropharynx as per pre-operative assessment

## 2015-03-22 NOTE — Discharge Instructions (Signed)

## 2015-03-22 NOTE — Brief Op Note (Signed)
03/22/2015  12:32 PM  PATIENT:  Mark Cannon  64 y.o. male  PRE-OPERATIVE DIAGNOSIS:  HIGH RISK PROSTATE CANCER  POST-OPERATIVE DIAGNOSIS:  HIGH RISK PROSTATE CANCER  PROCEDURE:  Procedure(s): ROBOTIC ASSISTED LAPAROSCOPIC RADICAL PROSTATECTOMY WITH INDOCYANINE GREEN DYE INJECTION (N/A) PELVIC LYMPHADENECTOMY (Bilateral) ROBOTIC ASSISTED LAPAROSCOPIC LYSIS OF ADHESION (extensive)  SURGEON:  Surgeon(s) and Role:    * Alexis Frock, MD - Primary  PHYSICIAN ASSISTANT:   ASSISTANTS: 1 - Felipa Furnace, PA 2 -  Gypsy Lore MD  ANESTHESIA:   general  EBL:  Total I/O In: 2000 [I.V.:2000] Out: 250 [Blood:250]  BLOOD ADMINISTERED:none  DRAINS: 1 - JP to bulb, 2 - Foley to gravity   LOCAL MEDICATIONS USED:  NONE  SPECIMEN:  Source of Specimen:  1 - Bilateral pelvic lymph nodes; 2 - radical prostatectomy; 3 - Posterior distal margin (frozen); 4 - Revised posterior margin  DISPOSITION OF SPECIMEN:  PATHOLOGY  COUNTS:  YES  TOURNIQUET:  * No tourniquets in log *  DICTATION: .Other Dictation: Dictation Number [id number]  PLAN OF CARE: Admit to inpatient   PATIENT DISPOSITION:  PACU - hemodynamically stable.   Delay start of Pharmacological VTE agent (>24hrs) due to surgical blood loss or risk of bleeding: yes

## 2015-03-22 NOTE — Anesthesia Postprocedure Evaluation (Signed)
  Anesthesia Post-op Note  Patient: Mark Cannon  Procedure(s) Performed: Procedure(s) (LRB): ROBOTIC ASSISTED LAPAROSCOPIC RADICAL PROSTATECTOMY WITH INDOCYANINE GREEN DYE INJECTION (N/A) PELVIC LYMPHADENECTOMY (Bilateral) ROBOTIC ASSISTED LAPAROSCOPIC LYSIS OF ADHESION  Patient Location: PACU  Anesthesia Type: General  Level of Consciousness: awake and alert   Airway and Oxygen Therapy: Patient Spontanous Breathing  Post-op Pain: mild  Post-op Assessment: Post-op Vital signs reviewed, Patient's Cardiovascular Status Stable, Respiratory Function Stable, Patent Airway and No signs of Nausea or vomiting  Last Vitals:  Filed Vitals:   03/22/15 1533  BP: 153/77  Pulse: 97  Temp: 36.8 C  Resp: 16    Post-op Vital Signs: stable   Complications: No apparent anesthesia complications

## 2015-03-22 NOTE — H&P (Signed)
Mark Cannon is an 64 y.o. male.    Chief Complaint: Pre-OP Prostatectomy  HPI:      1 - High risk prostate cancer - Gleason 8 3 cores (LLB, LMB, RMA ) and Gleason 7 3 cores (LLA, LLM, LMA) by biopsy 12/2014 on eval PSA 5.16. TRUS 67m, no sig median lobe. CT and bone scan w/o locally advanced or distant disease.  2 - Non-complex left renal cyst - incidental 6cm left posterior simple cyst on contrast CT 01/2015. NO mass effect.  PMH sig for HTN, bilateral inguinal hernia repair, open chole, ortho surgeries. His PCP is Dr. 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   Today " [name] to proceed with prostatectomy for primary management of his prostate cancer.   Past Medical History  Diagnosis Date  . Hypertension   . Hypercholesteremia   . Cough   . Colon polyps   . Cancer     Prostate  . GERD (gastroesophageal reflux disease)     occasional   . Arthritis   . H/O tooth extraction week of 03/15/2015     Past Surgical History  Procedure Laterality Date  . Cholecystectomy    . Appendectomy    . Hernia repair      Bilateral inguinal hernia repair X 4  . Back surgery    . Left knee arthroscopy    . Hydrocelectomy    . Colonoscopy w/ polypectomy    . Colonoscopy  02/04/2012    Procedure: COLONOSCOPY;  Surgeon: MJamesetta So MD;  Location: [facility];  Service: Gastroenterology;  Laterality: N/A;  . Vasectomy      Family History  Problem Relation Age of Onset  . Colon cancer Neg Hx    Social History:  reports that he has been smoking Cigarettes.  He has a 50 pack-year smoking history. He has never used smokeless tobacco. He reports that he drinks about 4.2 oz of alcohol per week. He reports that he does not use illicit drugs.  Allergies:  Allergies  Allergen Reactions  . Lidocaine Hives  . Benzocaine Other (See Comments)    Blisters     No prescriptions prior to admission     Results for orders placed or performed during the hospital encounter of 03/20/15 (from the past 48 hour(s))  CBC     Status: Abnormal   Collection Time: 03/20/15 10:35 AM  Result Value Ref Range   WBC 13.3 (H) 4.0 - 10.5 K/uL   RBC 5.31 4.22 - 5.81 MIL/uL   Hemoglobin 16.2 13.0 - 17.0 g/dL   HCT 48.1 39.0 - 52.0 %   MCV 90.6 78.0 - 100.0 fL   MCH 30.5 26.0 - 34.0 pg   MCHC 33.7 30.0 - 36.0 g/dL   RDW 13.5 11.5 - 15.5 %   Platelets 236 150 - 400 K/uL  Basic metabolic panel     Status: Abnormal   Collection Time: 03/20/15 10:35 AM  Result Value Ref Range   Sodium 140 135 - 145 mmol/L   Potassium 4.1 3.5 - 5.1 mmol/L   Chloride 99 (L) 101 - 111 mmol/L   CO2 33 (H) 22 - 32 mmol/L   Glucose, Bld 134 (H) 65 - 99 mg/dL   BUN 29 (H) 6 - 20 mg/dL   Creatinine, Ser 1.53 (H) 0.61 - 1.24 mg/dL   Calcium 9.2 8.9 - 10.3 mg/dL  GFR calc non Af Amer 46 (L) >60 mL/min   GFR calc Af Amer 54 (L) >60 mL/min    Comment: (NOTE) The eGFR has been calculated using the CKD EPI equation. This calculation has not been validated in all clinical situations. eGFR's persistently <60 mL/min signify possible Chronic Kidney Disease.    Anion gap 8 5 - 15  Type and screen     Status: None   Collection Time: 03/20/15 10:35 AM  Result Value Ref Range   ABO/RH(D) A POS    Antibody Screen NEG    Sample Expiration 04/03/2015   ABO/Rh     Status: None   Collection Time: 03/20/15 11:00 AM  Result Value Ref Range   ABO/RH(D) A POS    No results found.  Review of Systems  Constitutional: Negative.  Negative for fever, chills, weight loss and malaise/fatigue.  HENT: Negative.   Eyes: Negative.   Respiratory: Negative.   Cardiovascular: Negative.   Gastrointestinal: Negative.   Genitourinary: Positive for urgency. Negative for hematuria and flank pain.  Musculoskeletal: Negative.   Skin: Negative.   Neurological: Negative.   Endo/Heme/Allergies: Negative.   Psychiatric/Behavioral: Negative.      There were no vitals taken for this visit. Physical Exam  Constitutional: He appears well-developed.  HENT:  Head: Normocephalic.  Eyes: Pupils are equal, round, and reactive to light.  Neck: Normal range of motion.  Cardiovascular: Normal rate.   Respiratory: Effort normal.  GI: Soft.  Scars c/w known prior surgery  Genitourinary: Penis normal.  Musculoskeletal: Normal range of motion.  Neurological: He is alert.  Skin: Skin is warm.  Psychiatric: He has a normal mood and affect. His behavior is normal. Judgment and thought content normal.     Assessment/Plan     1 - High risk prostate cancer - pt very well versed in primary treatment options. Agree that with cliniclaly localized disesae in young man who is not sexually active and with some baseline obstructive symptoms primary surgery with purposeful wide dissection / lymphadenectomy is good option.   We rediscussed prostatectomy and specifically robotic prostatectomy with bilateral pelvic lymphadenectomy being the technique that I most commonly perform. I showed the patient on their abdomen the approximately 6 small incision (trocar) sites as well as presumed extraction sites with robotic approach as well as possible open incision sites should open conversion be necessary. We rediscussed peri-operative risks including bleeding, infection, deep vein thrombosis, pulmonary embolism, compartment syndrome, nuropathy / neuropraxia, heart attack, stroke, death, as well as long-term risks such as non-cure / need for additional therapy. We specificallyre addressed that the procedure would compromise urinary control leading to stress incontinence which typically resolves with time and pelvic rehabilitation (Kegel's, etc..), but can sometimes be permanent and require additional therapy including surgery. We also specifically readdressed sexual sequellae including significant erectile dysfunction which typically partially resolves with time but  can also be permanent and require additional therapy including surgery.   We rediscussed the typical hospital course including usual 1-2 night hospitalization, discharge with foley catheter in place usually for 1-2 weeks before voiding trial as well as usually 2 week recovery until able to perform most non-strenuous activity and 6 weeks until able to return to most jobs and more strenuous activity such as exercise.    2 - Non-complex left renal cyst - no further evaluation or surveillance warranted.    Luverta Korte 03/22/2015, 6:02 AM

## 2015-03-23 ENCOUNTER — Encounter (HOSPITAL_COMMUNITY): Payer: Self-pay | Admitting: Urology

## 2015-03-23 LAB — BASIC METABOLIC PANEL
ANION GAP: 8 (ref 5–15)
BUN: 22 mg/dL — ABNORMAL HIGH (ref 6–20)
CALCIUM: 7.9 mg/dL — AB (ref 8.9–10.3)
CO2: 27 mmol/L (ref 22–32)
CREATININE: 1.49 mg/dL — AB (ref 0.61–1.24)
Chloride: 99 mmol/L — ABNORMAL LOW (ref 101–111)
GFR calc non Af Amer: 48 mL/min — ABNORMAL LOW (ref >60)
GFR, EST AFRICAN AMERICAN: 55 mL/min — AB (ref >60)
GLUCOSE: 151 mg/dL — AB (ref 65–99)
POTASSIUM: 5.1 mmol/L (ref 3.5–5.1)
Sodium: 134 mmol/L — ABNORMAL LOW (ref 135–145)

## 2015-03-23 LAB — HEMOGLOBIN AND HEMATOCRIT, BLOOD
HCT: 42.5 % (ref 39.0–52.0)
Hemoglobin: 13.9 g/dL (ref 13.0–17.0)

## 2015-03-23 LAB — CREATININE, FLUID (PLEURAL, PERITONEAL, JP DRAINAGE): Creat, Fluid: 36.2 mg/dL

## 2015-03-23 NOTE — Care Management Note (Signed)
Case Management Note  Patient Details  Name: Mark Cannon MRN: [id number] Date of Birth: [birth year]  Subjective/Objective:  Prostate Cancer                  Action/Plan:from home plan to return to home   Expected Discharge Date:                  Expected Discharge Plan:  Home/Self Care  In-House Referral:     Discharge planning Services  CM Consult  Post Acute Care Choice:    Choice offered to:     DME Arranged:    DME Agency:     HH Arranged:    [place] Agency:     Status of Service:     Medicare Important Message Given:  N/A - LOS <3 / Initial given by admissions Date Medicare IM Given:    Medicare IM give by:    Date Additional Medicare IM Given:    Additional Medicare Important Message give by:     If discussed at [place] of Stay Meetings, dates discussed:    Additional Comments:Pt has no needs at present time.   Purcell Mouton, RN 03/23/2015, 3:06 PM

## 2015-03-23 NOTE — Op Note (Signed)
NAME:  Mark Cannon, Mark Cannon NO.:  [id number]  MEDICAL RECORD NO.:  [id number]  LOCATION:  16                         FACILITY:  [facility]  PHYSICIAN:  Mark Frock, MD     DATE OF BIRTH:  07/11/1951  DATE OF PROCEDURE: 03/22/2015                              OPERATIVE REPORT  DIAGNOSIS:  High-risk prostate cancer.  PROCEDURES: 1. Laparoscopic extensive adhesiolysis. 2. Robotic-assisted laparoscopic radical prostatectomy. 3. Laparoscopic bilateral pelvic lymphadenectomy. 4. Injection of an indocyanine green dye for sentinel     lymphangiography.  ESTIMATED BLOOD LOSS:  200 mL.  COMPLICATIONS:  None.  SPECIMENS: 1. Radical prostatectomy. 2. Posterior distal margin frozen section, benign glans. 3. Revised posterior margin for permanent. 4. Right pelvic lymph nodes. 5. Left pelvic lymph nodes.  ASSISTANTS: 1. Mark Baptist, PA. 2. Mark Lore, MD.  DRAINS: 1. Jackson-Pratt drain to bulb suction. 2. Foley catheter to straight drain.  FINDINGS: 1. Very protuberant abdomen at rest. 2. Extensive bilateral pelvic adhesions of redundant descending     colon mesentery and epiploic fat adherent to prior areas of     mesh hernia repair. 3. No obvious sentinel hyperfluorescent lymph nodes in pelvic fields with indocyanine green dye injection.  INDICATION:  Mark Cannon is a 64 year old gentleman with extensive medical surgical history.  He has COPD.  He has had prior surgeries include open cholecystectomy as well as bilateral hernia repair with mesh.  He was found on workup of elevated PSA.  They have multifocal Gleason 8 adenocarcinoma of the prostate.  Staging imaging revealed no obvious distant disease.  Options for definitive management were discussed including surveillance protocols versus ablative therapies versus surgical extirpation with and without minimally invasive assistance and the patient adamantly wished to proceed with robotic prostatectomy.   We had discussed preoperatively his extensive surgical history and implications of that, and he wished to proceed.  Informed consent was obtained and placed in the medical record.  PROCEDURE IN DETAIL:  The patient being in Mark Cannon, was verified.  Procedure being robotic prostatectomy was confirmed. Procedure was carried out.  Time-out was performed.  Intravenous antibiotics were administered.  General endotracheal anesthesia was introduced.  The patient was placed into a low lithotomy position and his arms were carefully tucked and padded at his side.  A test of steep Trendelenburg position was performed, he was found to be suitably positioned.  Sterile field was created by prepping and draping the patient's entire infra-xiphoid abdomen using chlorhexidine gluconate in his penis, perineum and proximal thighs using iodine.  A high-flow, low- pressure pneumoperitoneum was obtained using Veress technique in the supraumbilical midline, having passed the aspiration and drop test.  A 12-mm robotic camera port was placed into the same location. Examination of the peritoneal cavity under laparoscopic vision revealed multifocal dense-appearing adhesions mostly in the pelvis of a somewhat redundant-appearing descending colon and its associated mesenteric and epiploic fat to bilateral internal rings consistent with known hernia repair previously.  This obscured initial visualization of the bladder completely.  Additional ports were then placed as follows:  Right paramedian 8-mm robotic port, right far lateral 12-mm assistant port, right paramedian 5-mm suction port, left paramedian 8-mm  robotic port, left far lateral 8-mm robotic port.  Robot was docked and passed through the electronic checks.  Initial attention was directed to the adhesiolysis.  Very careful adhesiolysis was performed of mesenteric and epiploic fat of the very redundant descending colon, carefully lysing these  adhesions away from the pelvic wall especially in the area of his prior hernia repair.  Great care was taken to avoid any visceral injury or direct thermal injury to bowel and this did not occur.  Following these maneuvers, the dome of the bladder and posterior aspect of the bladder were then visualized between the medial umbilical ligaments.  Next, incision was made lateral to the right medial umbilical ligament from midline towards the area of the internal ring.  Vas deferens was identified, ligated, and used the gentle bucket handle medially and the right lateral bladder was swept away from the pelvic sidewall towards the area of the endopelvic fascia, which was carefully swept away from the lateral aspect of the prostate in base-to-apex orientation.  A mirror imaged dissection was performed on the left side transecting the left medial umbilical ligament, left Vas deferens and sweeping the left bladder away from the pelvic sidewall as well as sweeping the endopelvic fascia away from the lateral aspect of the prostate.  Anterior attachments were taken down using cautery scissors.  This exposed the anterior base of the prostate, which was first defatted allowing better visualization of the junction of the base of the prostate and the bladder neck and allowing better delineation of the dorsal venous complex.  Dorsal venous complex was controlled using vascular load stapler, resulted in excellent hemostatic control of the structure.  Next, robotically-guided percutaneously placed needle was advanced just superior to the pubic symphysis and 0.2 mL of indocyanine green dye was injected into each lobe of the prostate with intervening suctioning to prevent spillage.  The patient's bladder neck was then carefully identified by moving the Foley catheter back and forth and this was carefully taken down the lateral view orientation to allow maximal preservation of the bladder neck fibers.  Next,  bladder neck dissection was performed in anterior-posterior direction, separating the bladder neck away from the anterior base of the prostate taking great care to avoid excessive caliber bladder neck, which did not occur.  Posterior dissection was performed by incising approximately 7 mm inferior-posterior to the posterior lip of the bladder neck and dissecting within the plane of Denonvilliers.  Bilateral vas deferens were encountered, dissected for distance approximately 4 cm, ligated and placed on gentle superior traction.  Bilateral seminal vesicles were also dissected toward the tip, placed in gentle superior traction and dissection proceeded within the plane of Denonvilliers towards the area of the apex of the prostate.  This exposed the vascular pedicles, which were controlled using a sequential stapling technique in a base-to-apex orientation.  Given the patient's high risk disease, purposeful wide dissection was performed with minimal nerve sparing, this exposed the area of the membranous urethra, which was coldly transected the anterior plane again performing purposeful wide dissection giving the patient's known high risk disease.  This completely freed up the radical prostatectomy specimen, which was placed into an EndoCatch bag for later retrieval.  Inspection of the posterior plane did reveal some questionable residual prostatic material along the plane of Denonvilliers.  Frozen section, this was sent representatively in the distal portion and there was found to be some benign glands at this location.  As such, a revised dissection was performed posteriorly resecting the  plane of Denonvilliers fascia and proceeding in the plane just above the perirectal fat in this posterior plate, was then set aside for permanent pathology, as revised posterior margin.  Next, digital rectal exam was performed using laparoscopic vision with indicator glove and no evidence of rectal violation  was noted.  It had been greater than 30 minutes post ICG injection and the pelvis was inspected with near infrared fluorescent light.  Near infrared fluorescent sentinel lymphangiography revealed some mild diffuse fiber fatty uptake of dye, but no obvious sentinel lymph node to the external iliac, internal iliac and obturator groups respectively.  As such, a standard template lymphadenectomy was performed first on the right side including the external iliac and obturator groups.  Lymphostasis was achieved with cold clips.  The obturator nerve was inspected and very carefully swept away from the plane of dissection to avoid injury, which did not occur.  This fiber fatty tissue was set aside, labeled right pelvic lymph nodes. Similarly, a mirror imaged lymphangiography was performed on the left side, again taking great care to avoid injury to the left obturator nerve.  It was inspected following dissection and found to be grossly intact completely.  Next, a posterior recontruction was performed using a single V-Loc suture reapproximating the posterior urethral plate to the posterior bladder neck bringing these structures into tension-free apposition.  Mucosa-to-mucosa anastomosis was then performed using double-armed V-Loc suture from the 6 o'clock to 12 o'clock position. New Foley catheter was then easily placed, which irrigated quantitatively suggesting watertight anastomosis.  At this point, all sponge and needle counts were correct.  Hemostasis appeared excellent. We had achieved the goals of the primary surgery today.  Closed suction drain was brought through the previous left lateral most robotic port site to the peritoneal cavity.  Previous right 12-mm assistant port was closed at the level of the fascia using Zupko-Thomason suture passer under direct vision.  Robot was then undocked.  Specimen was retrieved by extending the previous camera port site inferiorly for total  distance approximately 4 cm removing the prostatectomy specimen and setting it aside for permanent pathology as well as reapproximating the level of facia using figure-of-eight PDS x3.  All skin incisions were then closed at the level of skin using subcuticular Monocryl followed by Dermabond. A nylon drain stitch was applied.  Procedure was then terminated.  The patient tolerated the procedure well.  There were no immediate periprocedural complications.  The patient was taken to the postanesthesia care unit in stable condition.          ______________________________ Mark Frock, MD     TM/MEDQ  D:  03/22/2015  T:  03/23/2015  Job:  [id number]

## 2015-03-23 NOTE — Progress Notes (Signed)
1 Day Post-Op  Subjective:  1 - High risk prostate cancer - s/p robotic radical prostatectomy with bilateral template + ICG pelvic lymphadenectomy and extensive adhesiolysis on 03/22/2015. Path pending.   2 - Urine Leak - relatively high JP output noted POD 1 prostatectomy. JP Cr c/w urine (36). Changed JP to off suction, placed foley on light traction, and explained importance of keeping foley bag below level of abdomen afternoon POD 1.  3 - Right Hand Numbness - pt with median-nerve distribution "pins and needles" sensation POD 1. No weakness. Has had similar issues previously attributed to C-spine disease and is s/p prior C-spine surgery. Has had Rt sided IV peri-op. Intraop positioning with arms at side for duration (not extended whatsoever).  Today "Mark Cannon" is w/o specific complaints. Some abd soreness. Ambulatory. Tollerating PO.   Objective: Vital signs in last 24 hours: Temp:  [97 F (36.1 C)-98.7 F (37.1 C)] 97.8 F (36.6 C) (06/09 1400) Pulse Rate:  [75-100] 75 (06/09 1400) Resp:  [10-18] 12 (06/09 1400) BP: (116-149)/(57-73) 116/58 mmHg (06/09 1400) SpO2:  [90 %-98 %] 97 % (06/09 1400) Last BM Date: 03/22/15  Intake/Output from previous day: 06/08 0701 - 06/09 0700 In: 5166.8 [P.O.:448; I.V.:4718.8] Out: 1825 [Urine:1200; Drains:375; Blood:250] Intake/Output this shift: Total I/O In: 480 [P.O.:480] Out: 1045 [Urine:450; Drains:595]  General appearance: alert, cooperative, appears stated age and sitting in chair, wife at bedside Eyes: negative Nose: Nares normal. Septum midline. Mucosa normal. No drainage or sinus tenderness. Throat: lips, mucosa, and tongue normal; teeth and gums normal Neck: supple, symmetrical, trachea midline Back: symmetric, no curvature. ROM normal. No CVA tenderness. Resp: non-labored on room air Cardio: Nl rate GI: soft, non-tender; bowel sounds normal; no masses,  no organomegaly and baseline sigtnificant truncal obesity and bilat inguinal  hernia scars. Male genitalia: normal, foley wtih clear urine, placed on light traction by catheter strap. Extremities: extremities normal, atraumatic, no cyanosis or edema Pulses: 2+ and symmetric Lymph nodes: Cervical, supraclavicular, and axillary nodes normal. Neurologic: Sensory: normal, reports some Rt thumb, index finger, middle finger numbness / pins and needles feeling. Intact proprioception. Symmetric bilat hand grip including thumb. Incision/Wound: Incision / port sites c/d/i. JP with copious simple appearing fluid. JP taken off suction.   Lab Results:   Recent Labs  03/22/15 1258 03/23/15 0424  HGB 15.1 13.9  HCT 46.8 42.5   BMET  Recent Labs  03/23/15 0424  NA 134*  K 5.1  CL 99*  CO2 27  GLUCOSE 151*  BUN 22*  CREATININE 1.49*  CALCIUM 7.9*   PT/INR No results for input(s): LABPROT, INR in the last 72 hours. ABG No results for input(s): PHART, HCO3 in the last 72 hours.  Invalid input(s): PCO2, PO2  Studies/Results: No results found.  Anti-infectives: Anti-infectives    Start     Dose/Rate Route Frequency Ordered Stop   03/22/15 0614  ceFAZolin (ANCEF) IVPB 2 g/50 mL premix     2 g 100 mL/hr over 30 Minutes Intravenous 30 min pre-op 03/22/15 0614 03/22/15 0840   03/22/15 0000  sulfamethoxazole-trimethoprim (BACTRIM DS,SEPTRA DS) 800-160 MG per tablet     1 tablet Oral 2 times daily 03/22/15 1220        Assessment/Plan:  1 - High risk prostate cancer - s/p surgery this admission, path pending.   2 - Urine Leak - confirmed by JP Cr. He had very challenging intraoperative anatomy due to adhesions and habitus. Explained importance of JP off suction, light foley traction, and  foley below level of body to pt + wife + nursing. Should this not improve with these measures will consider vented foley suction.   3 - Right Hand Numbness - likely neuropraxia, explained time course of usual resolution. If persists significantly post-op (at first post-op visit),  will consider neurol eval, but I do not feel warranted at this time as he does have intact motor and proprioception and numbness NOT complete, and no recent obvious hyper extension.   Remain in house.   Mark Cannon, Mark Cannon 03/23/2015

## 2015-03-23 NOTE — Progress Notes (Signed)
During shift assessment, pt stated that the first three fingers on his R hand are numb. Pt stated this happened with a prior surgery as well. Will make dayshift RN aware to let rounding urology MD know.

## 2015-03-24 MED ORDER — SENNOSIDES-DOCUSATE SODIUM 8.6-50 MG PO TABS
1.0000 | ORAL_TABLET | Freq: Two times a day (BID) | ORAL | Status: DC
Start: 2015-03-24 — End: 2018-04-22

## 2015-03-24 MED ORDER — HYDROMORPHONE HCL 2 MG PO TABS: 2.0000 mg | ORAL_TABLET | ORAL | Status: DC | PRN

## 2015-03-24 NOTE — Progress Notes (Signed)
Pam RN, discharge pt.  Went over all foley and leg bag teaching.  All questions answered.  Prescriptions given.  Pt wheeled out by NT.

## 2015-03-24 NOTE — Discharge Summary (Signed)
Physician Discharge Summary  Patient ID: Mark Cannon MRN: [id number] DOB/AGE: October 23, 1950 64 y.o.  Admit date: 03/22/2015 Discharge date: 03/24/2015  Admission Diagnoses: Prostate Cancer  Discharge Diagnoses:  Active Problems:   Prostate cancer   Discharged Condition: fair  Hospital Course:   1 - High risk prostate cancer - s/p robotic radical prostatectomy with bilateral template + ICG pelvic lymphadenectomy and extensive adhesiolysis on 03/22/2015. Path pending at discharge.   2 - Urine Leak - relatively high JP output noted POD 1 prostatectomy. JP Cr c/w urine (36). Changed JP to off suction, placed foley on light traction, and explained importance of keeping foley bag below level of abdomen afternoon POD 1. By POD 2, the day of discharge JP output decreased to <55mL over 18hrs with over 1930mL urine by foley same time therefore JP removed.   3 - Right Hand Numbness - pt with median-nerve distribution "pins and needles" sensation POD 1. No weakness. Has had similar issues previously attributed to C-spine disease and is s/p prior C-spine surgery. Has had Rt sided IV peri-op. Intraop positioning with arms at side for duration (not extended whatsoever). POD 2 this is significantly improved and again not associated with weakness on exam.  By PO2, pt's pain is manageable on PO meds, tolerating PO intake to maintain hydration, ambulatory, and felt to be adequate for discharge.  Consults: None  Significant Diagnostic Studies: labs: surgical path pending, JP Cr 36 POD 1.   Treatments: surgery: robotic radical prostatectomy with bilateral template + ICG pelvic lymphadenectomy and extensive adhesiolysis on 03/22/2015  Discharge Exam: Blood pressure 114/47, pulse 74, temperature 98.5 F (36.9 C), temperature source Oral, resp. rate 16, height 5\' 8"  (1.727 m), weight 98.884 kg (218 lb), SpO2 93 %. General appearance: alert, cooperative and appears stated age Head: Normocephalic, without obvious  abnormality, atraumatic Nose: Nares normal. Septum midline. Mucosa normal. No drainage or sinus tenderness. Throat: lips, mucosa, and tongue normal; teeth and gums normal Neck: supple, symmetrical, trachea midline Back: symmetric, no curvature. ROM normal. No CVA tenderness. Resp: non-labored on room air Cardio: Nl rate GI: soft, non-tender; bowel sounds normal; no masses,  no organomegaly and protuberant as per baseline.  Male genitalia: normal, foley c/d/i wtih clear urine. Extremities: extremities normal, atraumatic, no cyanosis or edema Neurologic: Motor: Rt hand with decreasd median distribution numbness per pt. Symmetric hand grip.  Incision/Wound: port sites c/d/i. JP with scant output, removed and dry dressing applied.   Disposition: 01-Home or Self Care     Medication List    STOP taking these medications        meloxicam 15 MG tablet  Commonly known as:  MOBIC     Vitamin D (Ergocalciferol) 50000 UNITS Caps capsule  Commonly known as:  DRISDOL      TAKE these medications        HYDROcodone-acetaminophen 5-325 MG per tablet  Commonly known as:  NORCO  Take 1-2 tablets by mouth every 6 (six) hours as needed.     HYDROmorphone 2 MG tablet  Commonly known as:  DILAUDID  Take 1 tablet (2 mg total) by mouth every 4 (four) hours as needed for moderate pain or severe pain. Post-operatively     lisinopril-hydrochlorothiazide 20-25 MG per tablet  Commonly known as:  PRINZIDE,ZESTORETIC  Take 1 tablet by mouth daily.     ranitidine 150 MG tablet  Commonly known as:  ZANTAC  Take 150 mg by mouth as needed for heartburn.     senna-docusate 8.6-50  MG per tablet  Commonly known as:  Senokot-S  Take 1 tablet by mouth 2 (two) times daily. While taking pain meds to prevent constipation     sulfamethoxazole-trimethoprim 800-160 MG per tablet  Commonly known as:  BACTRIM DS,SEPTRA DS  Take 1 tablet by mouth 2 (two) times daily. Start the day prior to foley removal  appointment           Follow-up Information    Follow up with Alexis Frock, MD On 03/30/2015.   Specialty:  Urology   Why:  at 9:45   Contact information:   [place] [id number] [phone]       Signed: Alexis Frock 03/24/2015, 6:52 AM

## 2015-03-24 NOTE — Progress Notes (Signed)
JP removed by MD

## 2015-04-21 ENCOUNTER — Encounter: Payer: Self-pay | Admitting: *Deleted

## 2015-05-09 ENCOUNTER — Encounter: Payer: Self-pay | Admitting: Cardiovascular Disease

## 2015-05-12 ENCOUNTER — Encounter: Payer: Self-pay | Admitting: Cardiovascular Disease

## 2016-01-15 DIAGNOSIS — N393 Stress incontinence (female) (male): Secondary | ICD-10-CM | POA: Diagnosis not present

## 2016-01-15 DIAGNOSIS — Z0000 Encounter for general adult medical examination without abnormal findings: Secondary | ICD-10-CM | POA: Diagnosis not present

## 2016-01-15 DIAGNOSIS — N281 Cyst of kidney, acquired: Secondary | ICD-10-CM | POA: Diagnosis not present

## 2016-01-15 DIAGNOSIS — C61 Malignant neoplasm of prostate: Secondary | ICD-10-CM | POA: Diagnosis not present

## 2016-01-15 DIAGNOSIS — N5201 Erectile dysfunction due to arterial insufficiency: Secondary | ICD-10-CM | POA: Diagnosis not present

## 2016-02-13 DIAGNOSIS — L821 Other seborrheic keratosis: Secondary | ICD-10-CM | POA: Diagnosis not present

## 2016-02-13 DIAGNOSIS — X32XXXA Exposure to sunlight, initial encounter: Secondary | ICD-10-CM | POA: Diagnosis not present

## 2016-02-13 DIAGNOSIS — L820 Inflamed seborrheic keratosis: Secondary | ICD-10-CM | POA: Diagnosis not present

## 2016-02-13 DIAGNOSIS — I8310 Varicose veins of unspecified lower extremity with inflammation: Secondary | ICD-10-CM | POA: Diagnosis not present

## 2016-02-13 DIAGNOSIS — L570 Actinic keratosis: Secondary | ICD-10-CM | POA: Diagnosis not present

## 2016-07-22 DIAGNOSIS — C61 Malignant neoplasm of prostate: Secondary | ICD-10-CM | POA: Diagnosis not present

## 2016-07-22 DIAGNOSIS — N5201 Erectile dysfunction due to arterial insufficiency: Secondary | ICD-10-CM | POA: Diagnosis not present

## 2016-07-22 DIAGNOSIS — N393 Stress incontinence (female) (male): Secondary | ICD-10-CM | POA: Diagnosis not present

## 2016-07-24 DIAGNOSIS — R9721 Rising PSA following treatment for malignant neoplasm of prostate: Secondary | ICD-10-CM | POA: Diagnosis not present

## 2016-07-24 DIAGNOSIS — C61 Malignant neoplasm of prostate: Secondary | ICD-10-CM | POA: Diagnosis not present

## 2016-07-24 DIAGNOSIS — R972 Elevated prostate specific antigen [PSA]: Secondary | ICD-10-CM | POA: Diagnosis not present

## 2016-07-24 DIAGNOSIS — Z9079 Acquired absence of other genital organ(s): Secondary | ICD-10-CM | POA: Diagnosis not present

## 2016-07-24 DIAGNOSIS — Z79899 Other long term (current) drug therapy: Secondary | ICD-10-CM | POA: Diagnosis not present

## 2016-07-24 DIAGNOSIS — F17210 Nicotine dependence, cigarettes, uncomplicated: Secondary | ICD-10-CM | POA: Diagnosis not present

## 2016-07-24 DIAGNOSIS — E669 Obesity, unspecified: Secondary | ICD-10-CM | POA: Diagnosis not present

## 2016-07-24 DIAGNOSIS — I10 Essential (primary) hypertension: Secondary | ICD-10-CM | POA: Diagnosis not present

## 2016-08-28 DIAGNOSIS — Z510 Encounter for antineoplastic radiation therapy: Secondary | ICD-10-CM | POA: Diagnosis not present

## 2016-08-28 DIAGNOSIS — C61 Malignant neoplasm of prostate: Secondary | ICD-10-CM | POA: Diagnosis not present

## 2016-09-10 DIAGNOSIS — C61 Malignant neoplasm of prostate: Secondary | ICD-10-CM | POA: Diagnosis not present

## 2016-09-10 DIAGNOSIS — Z510 Encounter for antineoplastic radiation therapy: Secondary | ICD-10-CM | POA: Diagnosis not present

## 2016-09-11 DIAGNOSIS — C61 Malignant neoplasm of prostate: Secondary | ICD-10-CM | POA: Diagnosis not present

## 2016-09-11 DIAGNOSIS — Z510 Encounter for antineoplastic radiation therapy: Secondary | ICD-10-CM | POA: Diagnosis not present

## 2016-09-12 DIAGNOSIS — Z510 Encounter for antineoplastic radiation therapy: Secondary | ICD-10-CM | POA: Diagnosis not present

## 2016-09-12 DIAGNOSIS — C61 Malignant neoplasm of prostate: Secondary | ICD-10-CM | POA: Diagnosis not present

## 2016-09-13 DIAGNOSIS — Z510 Encounter for antineoplastic radiation therapy: Secondary | ICD-10-CM | POA: Diagnosis not present

## 2016-09-13 DIAGNOSIS — C61 Malignant neoplasm of prostate: Secondary | ICD-10-CM | POA: Diagnosis not present

## 2016-09-16 DIAGNOSIS — Z510 Encounter for antineoplastic radiation therapy: Secondary | ICD-10-CM | POA: Diagnosis not present

## 2016-09-16 DIAGNOSIS — C61 Malignant neoplasm of prostate: Secondary | ICD-10-CM | POA: Diagnosis not present

## 2016-09-17 DIAGNOSIS — Z510 Encounter for antineoplastic radiation therapy: Secondary | ICD-10-CM | POA: Diagnosis not present

## 2016-09-17 DIAGNOSIS — C61 Malignant neoplasm of prostate: Secondary | ICD-10-CM | POA: Diagnosis not present

## 2016-09-18 DIAGNOSIS — C61 Malignant neoplasm of prostate: Secondary | ICD-10-CM | POA: Diagnosis not present

## 2016-09-18 DIAGNOSIS — Z510 Encounter for antineoplastic radiation therapy: Secondary | ICD-10-CM | POA: Diagnosis not present

## 2016-09-19 DIAGNOSIS — Z510 Encounter for antineoplastic radiation therapy: Secondary | ICD-10-CM | POA: Diagnosis not present

## 2016-09-19 DIAGNOSIS — C61 Malignant neoplasm of prostate: Secondary | ICD-10-CM | POA: Diagnosis not present

## 2016-09-20 DIAGNOSIS — C61 Malignant neoplasm of prostate: Secondary | ICD-10-CM | POA: Diagnosis not present

## 2016-09-20 DIAGNOSIS — Z510 Encounter for antineoplastic radiation therapy: Secondary | ICD-10-CM | POA: Diagnosis not present

## 2016-09-23 DIAGNOSIS — Z23 Encounter for immunization: Secondary | ICD-10-CM | POA: Diagnosis not present

## 2016-09-23 DIAGNOSIS — Z510 Encounter for antineoplastic radiation therapy: Secondary | ICD-10-CM | POA: Diagnosis not present

## 2016-09-23 DIAGNOSIS — C61 Malignant neoplasm of prostate: Secondary | ICD-10-CM | POA: Diagnosis not present

## 2016-09-24 DIAGNOSIS — C61 Malignant neoplasm of prostate: Secondary | ICD-10-CM | POA: Diagnosis not present

## 2016-09-24 DIAGNOSIS — Z510 Encounter for antineoplastic radiation therapy: Secondary | ICD-10-CM | POA: Diagnosis not present

## 2016-09-25 DIAGNOSIS — C61 Malignant neoplasm of prostate: Secondary | ICD-10-CM | POA: Diagnosis not present

## 2016-09-25 DIAGNOSIS — Z510 Encounter for antineoplastic radiation therapy: Secondary | ICD-10-CM | POA: Diagnosis not present

## 2016-09-26 DIAGNOSIS — Z510 Encounter for antineoplastic radiation therapy: Secondary | ICD-10-CM | POA: Diagnosis not present

## 2016-09-26 DIAGNOSIS — C61 Malignant neoplasm of prostate: Secondary | ICD-10-CM | POA: Diagnosis not present

## 2016-09-27 DIAGNOSIS — C61 Malignant neoplasm of prostate: Secondary | ICD-10-CM | POA: Diagnosis not present

## 2016-09-27 DIAGNOSIS — Z510 Encounter for antineoplastic radiation therapy: Secondary | ICD-10-CM | POA: Diagnosis not present

## 2016-09-30 DIAGNOSIS — Z510 Encounter for antineoplastic radiation therapy: Secondary | ICD-10-CM | POA: Diagnosis not present

## 2016-09-30 DIAGNOSIS — C61 Malignant neoplasm of prostate: Secondary | ICD-10-CM | POA: Diagnosis not present

## 2016-10-01 DIAGNOSIS — Z510 Encounter for antineoplastic radiation therapy: Secondary | ICD-10-CM | POA: Diagnosis not present

## 2016-10-01 DIAGNOSIS — C61 Malignant neoplasm of prostate: Secondary | ICD-10-CM | POA: Diagnosis not present

## 2016-10-02 DIAGNOSIS — Z510 Encounter for antineoplastic radiation therapy: Secondary | ICD-10-CM | POA: Diagnosis not present

## 2016-10-02 DIAGNOSIS — C61 Malignant neoplasm of prostate: Secondary | ICD-10-CM | POA: Diagnosis not present

## 2016-10-03 DIAGNOSIS — C61 Malignant neoplasm of prostate: Secondary | ICD-10-CM | POA: Diagnosis not present

## 2016-10-03 DIAGNOSIS — Z510 Encounter for antineoplastic radiation therapy: Secondary | ICD-10-CM | POA: Diagnosis not present

## 2016-10-04 DIAGNOSIS — C61 Malignant neoplasm of prostate: Secondary | ICD-10-CM | POA: Diagnosis not present

## 2016-10-04 DIAGNOSIS — Z510 Encounter for antineoplastic radiation therapy: Secondary | ICD-10-CM | POA: Diagnosis not present

## 2016-10-08 DIAGNOSIS — C61 Malignant neoplasm of prostate: Secondary | ICD-10-CM | POA: Diagnosis not present

## 2016-10-08 DIAGNOSIS — Z510 Encounter for antineoplastic radiation therapy: Secondary | ICD-10-CM | POA: Diagnosis not present

## 2016-10-09 DIAGNOSIS — C61 Malignant neoplasm of prostate: Secondary | ICD-10-CM | POA: Diagnosis not present

## 2016-10-09 DIAGNOSIS — Z510 Encounter for antineoplastic radiation therapy: Secondary | ICD-10-CM | POA: Diagnosis not present

## 2016-10-10 DIAGNOSIS — Z510 Encounter for antineoplastic radiation therapy: Secondary | ICD-10-CM | POA: Diagnosis not present

## 2016-10-10 DIAGNOSIS — C61 Malignant neoplasm of prostate: Secondary | ICD-10-CM | POA: Diagnosis not present

## 2016-10-11 DIAGNOSIS — C61 Malignant neoplasm of prostate: Secondary | ICD-10-CM | POA: Diagnosis not present

## 2016-10-11 DIAGNOSIS — Z510 Encounter for antineoplastic radiation therapy: Secondary | ICD-10-CM | POA: Diagnosis not present

## 2016-10-15 DIAGNOSIS — Z510 Encounter for antineoplastic radiation therapy: Secondary | ICD-10-CM | POA: Diagnosis not present

## 2016-10-15 DIAGNOSIS — C61 Malignant neoplasm of prostate: Secondary | ICD-10-CM | POA: Diagnosis not present

## 2016-10-16 DIAGNOSIS — Z510 Encounter for antineoplastic radiation therapy: Secondary | ICD-10-CM | POA: Diagnosis not present

## 2016-10-16 DIAGNOSIS — C61 Malignant neoplasm of prostate: Secondary | ICD-10-CM | POA: Diagnosis not present

## 2016-10-17 DIAGNOSIS — C61 Malignant neoplasm of prostate: Secondary | ICD-10-CM | POA: Diagnosis not present

## 2016-10-17 DIAGNOSIS — Z510 Encounter for antineoplastic radiation therapy: Secondary | ICD-10-CM | POA: Diagnosis not present

## 2016-10-18 DIAGNOSIS — Z510 Encounter for antineoplastic radiation therapy: Secondary | ICD-10-CM | POA: Diagnosis not present

## 2016-10-18 DIAGNOSIS — C61 Malignant neoplasm of prostate: Secondary | ICD-10-CM | POA: Diagnosis not present

## 2016-10-21 DIAGNOSIS — Z510 Encounter for antineoplastic radiation therapy: Secondary | ICD-10-CM | POA: Diagnosis not present

## 2016-10-21 DIAGNOSIS — C61 Malignant neoplasm of prostate: Secondary | ICD-10-CM | POA: Diagnosis not present

## 2016-10-22 DIAGNOSIS — Z510 Encounter for antineoplastic radiation therapy: Secondary | ICD-10-CM | POA: Diagnosis not present

## 2016-10-22 DIAGNOSIS — C61 Malignant neoplasm of prostate: Secondary | ICD-10-CM | POA: Diagnosis not present

## 2016-10-23 DIAGNOSIS — Z510 Encounter for antineoplastic radiation therapy: Secondary | ICD-10-CM | POA: Diagnosis not present

## 2016-10-23 DIAGNOSIS — C61 Malignant neoplasm of prostate: Secondary | ICD-10-CM | POA: Diagnosis not present

## 2016-10-24 DIAGNOSIS — Z510 Encounter for antineoplastic radiation therapy: Secondary | ICD-10-CM | POA: Diagnosis not present

## 2016-10-24 DIAGNOSIS — C61 Malignant neoplasm of prostate: Secondary | ICD-10-CM | POA: Diagnosis not present

## 2016-10-25 DIAGNOSIS — Z510 Encounter for antineoplastic radiation therapy: Secondary | ICD-10-CM | POA: Diagnosis not present

## 2016-10-25 DIAGNOSIS — C61 Malignant neoplasm of prostate: Secondary | ICD-10-CM | POA: Diagnosis not present

## 2016-10-28 DIAGNOSIS — Z510 Encounter for antineoplastic radiation therapy: Secondary | ICD-10-CM | POA: Diagnosis not present

## 2016-10-28 DIAGNOSIS — C61 Malignant neoplasm of prostate: Secondary | ICD-10-CM | POA: Diagnosis not present

## 2016-10-29 DIAGNOSIS — C61 Malignant neoplasm of prostate: Secondary | ICD-10-CM | POA: Diagnosis not present

## 2016-10-29 DIAGNOSIS — Z510 Encounter for antineoplastic radiation therapy: Secondary | ICD-10-CM | POA: Diagnosis not present

## 2016-11-01 DIAGNOSIS — C61 Malignant neoplasm of prostate: Secondary | ICD-10-CM | POA: Diagnosis not present

## 2016-11-01 DIAGNOSIS — Z510 Encounter for antineoplastic radiation therapy: Secondary | ICD-10-CM | POA: Diagnosis not present

## 2016-11-04 DIAGNOSIS — C61 Malignant neoplasm of prostate: Secondary | ICD-10-CM | POA: Diagnosis not present

## 2016-11-04 DIAGNOSIS — Z510 Encounter for antineoplastic radiation therapy: Secondary | ICD-10-CM | POA: Diagnosis not present

## 2016-11-05 DIAGNOSIS — Z510 Encounter for antineoplastic radiation therapy: Secondary | ICD-10-CM | POA: Diagnosis not present

## 2016-11-05 DIAGNOSIS — C61 Malignant neoplasm of prostate: Secondary | ICD-10-CM | POA: Diagnosis not present

## 2016-11-06 DIAGNOSIS — Z510 Encounter for antineoplastic radiation therapy: Secondary | ICD-10-CM | POA: Diagnosis not present

## 2016-11-06 DIAGNOSIS — C61 Malignant neoplasm of prostate: Secondary | ICD-10-CM | POA: Diagnosis not present

## 2016-11-20 DIAGNOSIS — C61 Malignant neoplasm of prostate: Secondary | ICD-10-CM | POA: Diagnosis not present

## 2016-11-20 DIAGNOSIS — R300 Dysuria: Secondary | ICD-10-CM | POA: Diagnosis not present

## 2016-11-20 DIAGNOSIS — Z923 Personal history of irradiation: Secondary | ICD-10-CM | POA: Diagnosis not present

## 2016-11-20 DIAGNOSIS — Z9079 Acquired absence of other genital organ(s): Secondary | ICD-10-CM | POA: Diagnosis not present

## 2016-12-01 ENCOUNTER — Emergency Department (HOSPITAL_COMMUNITY)
Admission: EM | Admit: 2016-12-01 | Discharge: 2016-12-01 | Disposition: A | Discharge status: Home / Self Care | Payer: Medicare Other | Attending: Emergency Medicine | Admitting: Emergency Medicine

## 2016-12-01 ENCOUNTER — Encounter (HOSPITAL_COMMUNITY): Payer: Self-pay

## 2016-12-01 ENCOUNTER — Emergency Department (HOSPITAL_COMMUNITY): Payer: Medicare Other

## 2016-12-01 DIAGNOSIS — Z8546 Personal history of malignant neoplasm of prostate: Secondary | ICD-10-CM | POA: Insufficient documentation

## 2016-12-01 DIAGNOSIS — Z79899 Other long term (current) drug therapy: Secondary | ICD-10-CM | POA: Insufficient documentation

## 2016-12-01 DIAGNOSIS — R339 Retention of urine, unspecified: Secondary | ICD-10-CM | POA: Diagnosis not present

## 2016-12-01 DIAGNOSIS — I10 Essential (primary) hypertension: Secondary | ICD-10-CM | POA: Insufficient documentation

## 2016-12-01 DIAGNOSIS — F17210 Nicotine dependence, cigarettes, uncomplicated: Secondary | ICD-10-CM | POA: Insufficient documentation

## 2016-12-01 DIAGNOSIS — K5730 Diverticulosis of large intestine without perforation or abscess without bleeding: Secondary | ICD-10-CM | POA: Diagnosis not present

## 2016-12-01 DIAGNOSIS — R338 Other retention of urine: Secondary | ICD-10-CM

## 2016-12-01 HISTORY — DX: Disorder of kidney and ureter, unspecified: N28.9

## 2016-12-01 LAB — CBC WITH DIFFERENTIAL/PLATELET
Basophils Absolute: 0 10*3/uL (ref 0.0–0.1)
Basophils Relative: 0 %
EOS ABS: 0.3 10*3/uL (ref 0.0–0.7)
Eosinophils Relative: 4 %
HCT: 45.1 % (ref 39.0–52.0)
HEMOGLOBIN: 15.4 g/dL (ref 13.0–17.0)
LYMPHS ABS: 1.1 10*3/uL (ref 0.7–4.0)
Lymphocytes Relative: 14 %
MCH: 30.9 pg (ref 26.0–34.0)
MCHC: 34.1 g/dL (ref 30.0–36.0)
MCV: 90.6 fL (ref 78.0–100.0)
MONOS PCT: 6 %
Monocytes Absolute: 0.5 10*3/uL (ref 0.1–1.0)
NEUTROS PCT: 76 %
Neutro Abs: 6 10*3/uL (ref 1.7–7.7)
Platelets: 218 10*3/uL (ref 150–400)
RBC: 4.98 MIL/uL (ref 4.22–5.81)
RDW: 13.8 % (ref 11.5–15.5)
WBC: 7.9 10*3/uL (ref 4.0–10.5)

## 2016-12-01 LAB — URINALYSIS, ROUTINE W REFLEX MICROSCOPIC
Bacteria, UA: NONE SEEN
Bilirubin Urine: NEGATIVE
Glucose, UA: NEGATIVE mg/dL
Hgb urine dipstick: NEGATIVE
Ketones, ur: NEGATIVE mg/dL
Leukocytes, UA: NEGATIVE
Nitrite: NEGATIVE
PH: 6 (ref 5.0–8.0)
Protein, ur: NEGATIVE mg/dL
Specific Gravity, Urine: 1.018 (ref 1.005–1.030)
Squamous Epithelial / LPF: NONE SEEN

## 2016-12-01 LAB — BASIC METABOLIC PANEL
Anion gap: 7 (ref 5–15)
BUN: 15 mg/dL (ref 6–20)
CHLORIDE: 101 mmol/L (ref 101–111)
CO2: 30 mmol/L (ref 22–32)
CREATININE: 1.17 mg/dL (ref 0.61–1.24)
Calcium: 9 mg/dL (ref 8.9–10.3)
GFR calc Af Amer: >60 mL/min (ref >60)
GFR calc non Af Amer: >60 mL/min (ref >60)
Glucose, Bld: 114 mg/dL — ABNORMAL HIGH (ref 65–99)
Potassium: 3.8 mmol/L (ref 3.5–5.1)
SODIUM: 138 mmol/L (ref 135–145)

## 2016-12-01 LAB — I-STAT CHEM 8, ED
BUN: 15 mg/dL (ref 6–20)
CHLORIDE: 99 mmol/L — AB (ref 101–111)
Calcium, Ion: 1.11 mmol/L — ABNORMAL LOW (ref 1.15–1.40)
Creatinine, Ser: 1.2 mg/dL (ref 0.61–1.24)
Glucose, Bld: 114 mg/dL — ABNORMAL HIGH (ref 65–99)
HCT: 46 % (ref 39.0–52.0)
Hemoglobin: 15.6 g/dL (ref 13.0–17.0)
Potassium: 4 mmol/L (ref 3.5–5.1)
Sodium: 140 mmol/L (ref 135–145)
TCO2: 30 mmol/L (ref 0–100)

## 2016-12-01 MED ORDER — LORAZEPAM 1 MG PO TABS: 0.5000 mg | ORAL_TABLET | Freq: Three times a day (TID) | ORAL | 0 refills | Status: DC | PRN

## 2016-12-01 MED ORDER — HYDROMORPHONE HCL 1 MG/ML IJ SOLN
1.0000 mg | Freq: Once | INTRAMUSCULAR | Status: AC
  Administered 2016-12-01: 1 mg via INTRAVENOUS
  Filled 2016-12-01: qty 1

## 2016-12-01 MED ORDER — ONDANSETRON HCL 4 MG/2ML IJ SOLN
4.0000 mg | Freq: Once | INTRAMUSCULAR | Status: AC
  Administered 2016-12-01: 4 mg via INTRAVENOUS
  Filled 2016-12-01: qty 2

## 2016-12-01 MED ORDER — SODIUM CHLORIDE 0.9 % IV BOLUS (SEPSIS)
1000.0000 mL | Freq: Once | INTRAVENOUS | Status: AC
  Administered 2016-12-01: 1000 mL via INTRAVENOUS

## 2016-12-01 MED ORDER — IOPAMIDOL (ISOVUE-300) INJECTION 61%
INTRAVENOUS | Status: AC
  Filled 2016-12-01: qty 100

## 2016-12-01 MED ORDER — SODIUM CHLORIDE 0.9 % IJ SOLN
INTRAMUSCULAR | Status: AC
  Filled 2016-12-01: qty 50

## 2016-12-01 MED ORDER — IOPAMIDOL (ISOVUE-300) INJECTION 61%
100.0000 mL | Freq: Once | INTRAVENOUS | Status: AC | PRN
  Administered 2016-12-01: 100 mL via INTRAVENOUS

## 2016-12-01 NOTE — ED Provider Notes (Addendum)
[place] DEPT Provider Note   CSN: [id number] Arrival date & time: 12/01/16  [id number]     History   Chief Complaint Chief Complaint  Patient presents with  . Not able to urinate    HPI Mark Cannon is a 66 y.o. male with a Past medical history of prostatectomy and prostate cancer. He has chronic dysuria secondary to radiation therapy. Patient presents today stating that he feels as if he has acute urinary retention. He states that his last urinated at 2 AM. He has urgency to urinate with pain, however, feels as if he is not able to void. He denies fevers, chills, abdominal pain. He has not had any rectal pain, constipation, nausea or vomiting.  HPI  Past Medical History:  Diagnosis Date  . Arthritis   . Cancer [facility])    Prostate  . Colon polyps   . Cough   . GERD (gastroesophageal reflux disease)    occasional   . H/O tooth extraction week of 03/15/2015   . Hypercholesteremia   . Hypertension   . Renal disorder     Patient Active Problem List   Diagnosis Date Noted  . Prostate cancer ([place]) 03/22/2015    Past Surgical History:  Procedure Laterality Date  . APPENDECTOMY    . BACK SURGERY    . CHOLECYSTECTOMY    . COLONOSCOPY  02/04/2012   Procedure: COLONOSCOPY;  Surgeon: Jamesetta So, MD;  Location: [facility];  Service: Gastroenterology;  Laterality: N/A;  . COLONOSCOPY W/ POLYPECTOMY    . HERNIA REPAIR     Bilateral inguinal hernia repair X 4  . hydrocelectomy    . left knee arthroscopy    . LYMPHADENECTOMY Bilateral 03/22/2015   Procedure: PELVIC LYMPHADENECTOMY;  Surgeon: Alexis Frock, MD;  Location: [facility] ORS;  Service: Urology;  Laterality: Bilateral;  . NM MYOVIEW LTD  2011  . ROBOT ASSISTED LAPAROSCOPIC RADICAL PROSTATECTOMY N/A 03/22/2015   Procedure: ROBOTIC ASSISTED LAPAROSCOPIC RADICAL PROSTATECTOMY WITH INDOCYANINE GREEN DYE INJECTION;  Surgeon: Alexis Frock, MD;  Location: [facility] ORS;  Service: Urology;  Laterality: N/A;  . ROBOTIC ASSISTED  LAPAROSCOPIC LYSIS OF ADHESION  03/22/2015   Procedure: ROBOTIC ASSISTED LAPAROSCOPIC LYSIS OF ADHESION;  Surgeon: Alexis Frock, MD;  Location: [facility] ORS;  Service: Urology;;  . VASECTOMY         Home Medications    Prior to Admission medications   Medication Sig Start Date End Date Taking? Authorizing Provider  HYDROcodone-acetaminophen (NORCO) 5-325 MG per tablet Take 1-2 tablets by mouth every 6 (six) hours as needed. 03/22/15   Debbrah Alar, PA-C  HYDROmorphone (DILAUDID) 2 MG tablet Take 1 tablet (2 mg total) by mouth every 4 (four) hours as needed for moderate pain or severe pain. Post-operatively 03/24/15   Alexis Frock, MD  lisinopril-hydrochlorothiazide (PRINZIDE,ZESTORETIC) 20-25 MG per tablet Take 1 tablet by mouth daily.    Historical Provider, MD  ranitidine (ZANTAC) 150 MG tablet Take 150 mg by mouth as needed for heartburn.    Historical Provider, MD  senna-docusate (SENOKOT-S) 8.6-50 MG per tablet Take 1 tablet by mouth 2 (two) times daily. While taking pain meds to prevent constipation 03/24/15   Alexis Frock, MD  sulfamethoxazole-trimethoprim (BACTRIM DS,SEPTRA DS) 800-160 MG per tablet Take 1 tablet by mouth 2 (two) times daily. Start the day prior to foley removal appointment 03/22/15   Debbrah Alar, PA-C    Family History Family History  Problem Relation Age of Onset  . Heart failure Father   .  Stroke Father   . Colon cancer Neg Hx     Social History Social History  Substance Use Topics  . Smoking status: Current Every Day Smoker    Packs/day: 1.00    Years: 50.00    Types: Cigarettes  . Smokeless tobacco: Never Used  . Alcohol use 4.2 oz/week    7 Glasses of wine per week     Allergies   Lidocaine and Benzocaine   Review of Systems Review of Systems  Ten systems reviewed and are negative for acute change, except as noted in the HPI.    Physical Exam Updated Vital Signs BP 159/77 (BP Location: Left Arm)   Pulse 99   Temp 98.1 F (36.7 C) (Oral)    Resp 16   Ht 5\' 8"  (1.727 m)   Wt 101.6 kg   SpO2 94%   BMI 34.06 kg/m   Physical Exam  Constitutional: He is oriented to person, place, and time. He appears well-developed and well-nourished. No distress.  HENT:  Head: Normocephalic and atraumatic.  Eyes: Conjunctivae are normal. No scleral icterus.  Neck: Normal range of motion. Neck supple.  Cardiovascular: Normal rate, regular rhythm and normal heart sounds.   Pulmonary/Chest: Effort normal and breath sounds normal. No respiratory distress.  Abdominal: Soft. He exhibits no distension and no mass. There is no tenderness.  Genitourinary:  Genitourinary Comments: Digital Rectal Exam reveals sphincter with good tone. No external hemorrhoids. No masses or fissures. Stool color is brown with no overt blood.  Normal male anatomy, circumcised, no structural abnormalities, no blockage at the urethra, no swelling of the glans. Testicles are nontender.   Musculoskeletal: He exhibits no edema.  Neurological: He is alert and oriented to person, place, and time.  Skin: Skin is warm and dry. He is not diaphoretic.  Psychiatric: His behavior is normal.  Nursing note and vitals reviewed.    ED Treatments / Results  Labs (all labs ordered are listed, but only abnormal results are displayed) Labs Reviewed  I-STAT CHEM 8, ED - Abnormal; Notable for the following:       Result Value   Chloride 99 (*)    Glucose, Bld 114 (*)    Calcium, Ion 1.11 (*)    All other components within normal limits  URINALYSIS, ROUTINE W REFLEX MICROSCOPIC    EKG  EKG Interpretation None       Radiology No results found.  Procedures Procedures (including critical care time)  Medications Ordered in ED Medications  sodium chloride 0.9 % bolus 1,000 mL (not administered)  HYDROmorphone (DILAUDID) injection 1 mg (not administered)  ondansetron (ZOFRAN) injection 4 mg (not administered)   Patient with no urinary infection. A coud cath was placed  and he did relieve himself about 200 mL of fluid. He seemed to be having potentially severe or bladder spasms and was crying out in pain earlier. I did not suspect that this gentleman frequently cries due to pain. He feels much more comfortable with the catheter in. CT scan performed. There is no significant abnormalities including no abscess or infection. He'll be discharged with a urine catheter and leg bag. He is follow-up with Dr. Tresa Moore of [facility]. Her safe for discharge at this time  Initial Impression / Assessment and Plan / ED Course  I have reviewed the triage vital signs and the nursing notes.  Pertinent labs & imaging results that were available during my care of the patient were reviewed by me and considered in my medical  decision making (see chart for details).       Final Clinical Impressions(s) / ED Diagnoses   Final diagnoses:  Acute urinary retention    New Prescriptions New Prescriptions   No medications on file     Margarita Mail, PA-C 12/01/16 [place], DO 12/04/16 [place], PA-C 12/11/16 [place], DO 12/13/16 1601

## 2016-12-01 NOTE — ED Notes (Addendum)
Pt arrived with urinary retention symptoms, bladder scanned, 97 ml noted, abd distended, firm upon sitting tender and soft when reclined. BM this am.

## 2016-12-01 NOTE — Discharge Instructions (Signed)
Contact a health care provider if:  You develop a low-grade fever.  You experience spasms or leakage of urine with the spasms. Get help right away if:  You develop chills or fever.  Your catheter stops draining urine.  Your catheter falls out.  You start to develop increased bleeding that does not respond to rest and increased fluid intake. This information is not intended to replace advice given to you by your health care provider. Make sure you discuss any questions you have with your health care provider.

## 2016-12-01 NOTE — ED Notes (Signed)
PT DISCHARGED. INSTRUCTIONS AND PRESCRIPTION GIVEN. AAOX4. PT IN NO APPARENT DISTRESS OR PAIN. THE OPPORTUNITY TO ASK QUESTIONS WAS PROVIDED. 

## 2016-12-01 NOTE — ED Triage Notes (Signed)
Patient states he last had radiation to the prostate 3 1/2 weeks ago and is now unable to urinate. Patient states he oast urinated at 0215 and then only a small amount Patient states he has had burning when urinating for months.

## 2016-12-01 NOTE — ED Notes (Signed)
Bed: WA19 Expected date:  Expected time:  Means of arrival:  Comments: 

## 2016-12-05 DIAGNOSIS — N3040 Irradiation cystitis without hematuria: Secondary | ICD-10-CM | POA: Diagnosis not present

## 2016-12-05 DIAGNOSIS — N393 Stress incontinence (female) (male): Secondary | ICD-10-CM | POA: Diagnosis not present

## 2016-12-05 DIAGNOSIS — N281 Cyst of kidney, acquired: Secondary | ICD-10-CM | POA: Diagnosis not present

## 2016-12-05 DIAGNOSIS — C61 Malignant neoplasm of prostate: Secondary | ICD-10-CM | POA: Diagnosis not present

## 2016-12-05 DIAGNOSIS — N5201 Erectile dysfunction due to arterial insufficiency: Secondary | ICD-10-CM | POA: Diagnosis not present

## 2017-01-16 DIAGNOSIS — Z719 Counseling, unspecified: Secondary | ICD-10-CM | POA: Diagnosis not present

## 2017-01-16 DIAGNOSIS — Z1389 Encounter for screening for other disorder: Secondary | ICD-10-CM | POA: Diagnosis not present

## 2017-01-16 DIAGNOSIS — Z6834 Body mass index (BMI) 34.0-34.9, adult: Secondary | ICD-10-CM | POA: Diagnosis not present

## 2017-01-16 DIAGNOSIS — C61 Malignant neoplasm of prostate: Secondary | ICD-10-CM | POA: Diagnosis not present

## 2017-01-16 DIAGNOSIS — E782 Mixed hyperlipidemia: Secondary | ICD-10-CM | POA: Diagnosis not present

## 2017-01-16 DIAGNOSIS — Z0001 Encounter for general adult medical examination with abnormal findings: Secondary | ICD-10-CM | POA: Diagnosis not present

## 2017-01-16 DIAGNOSIS — R05 Cough: Secondary | ICD-10-CM | POA: Diagnosis not present

## 2017-01-16 DIAGNOSIS — F17290 Nicotine dependence, other tobacco product, uncomplicated: Secondary | ICD-10-CM | POA: Diagnosis not present

## 2017-01-16 DIAGNOSIS — E6609 Other obesity due to excess calories: Secondary | ICD-10-CM | POA: Diagnosis not present

## 2017-02-26 MED ORDER — ALBUTEROL SULFATE HFA 108 (90 BASE) MCG/ACT IN AERS
2 | Freq: Four times a day (QID) | RESPIRATORY_TRACT | 3 refills | Status: AC | PRN
Start: 2017-02-26 — End: 2017-07-23

## 2017-03-18 MED ORDER — MONTELUKAST SODIUM 10 MG PO TABS
ORAL_TABLET | 1 refills | Status: AC
Start: 2017-03-18 — End: 2017-09-16

## 2017-05-29 DIAGNOSIS — C61 Malignant neoplasm of prostate: Secondary | ICD-10-CM | POA: Diagnosis not present

## 2017-06-03 DIAGNOSIS — C61 Malignant neoplasm of prostate: Secondary | ICD-10-CM | POA: Diagnosis not present

## 2017-06-03 DIAGNOSIS — N5201 Erectile dysfunction due to arterial insufficiency: Secondary | ICD-10-CM | POA: Diagnosis not present

## 2017-06-03 DIAGNOSIS — N3040 Irradiation cystitis without hematuria: Secondary | ICD-10-CM | POA: Diagnosis not present

## 2017-06-03 DIAGNOSIS — N393 Stress incontinence (female) (male): Secondary | ICD-10-CM | POA: Diagnosis not present

## 2017-07-10 DIAGNOSIS — L814 Other melanin hyperpigmentation: Secondary | ICD-10-CM | POA: Diagnosis not present

## 2017-07-10 DIAGNOSIS — L818 Other specified disorders of pigmentation: Secondary | ICD-10-CM | POA: Diagnosis not present

## 2017-07-10 DIAGNOSIS — L400 Psoriasis vulgaris: Secondary | ICD-10-CM | POA: Diagnosis not present

## 2017-07-10 DIAGNOSIS — L570 Actinic keratosis: Secondary | ICD-10-CM | POA: Diagnosis not present

## 2017-07-10 DIAGNOSIS — X32XXXD Exposure to sunlight, subsequent encounter: Secondary | ICD-10-CM | POA: Diagnosis not present

## 2017-07-14 ENCOUNTER — Other Ambulatory Visit: Payer: Self-pay | Admitting: Urology

## 2017-07-14 DIAGNOSIS — C61 Malignant neoplasm of prostate: Secondary | ICD-10-CM

## 2017-07-22 MED ORDER — ALBUTEROL SULFATE HFA 108 (90 BASE) MCG/ACT IN AERS
2 | Freq: Four times a day (QID) | RESPIRATORY_TRACT | 3 refills | Status: AC | PRN
Start: 2017-07-22 — End: 2017-10-31

## 2017-08-12 DIAGNOSIS — Z23 Encounter for immunization: Secondary | ICD-10-CM | POA: Diagnosis not present

## 2017-08-22 DIAGNOSIS — I10 Essential (primary) hypertension: Secondary | ICD-10-CM | POA: Diagnosis not present

## 2017-08-22 DIAGNOSIS — Z1389 Encounter for screening for other disorder: Secondary | ICD-10-CM | POA: Diagnosis not present

## 2017-08-22 DIAGNOSIS — Z6836 Body mass index (BMI) 36.0-36.9, adult: Secondary | ICD-10-CM | POA: Diagnosis not present

## 2017-08-22 DIAGNOSIS — E6609 Other obesity due to excess calories: Secondary | ICD-10-CM | POA: Diagnosis not present

## 2017-08-22 DIAGNOSIS — E782 Mixed hyperlipidemia: Secondary | ICD-10-CM | POA: Diagnosis not present

## 2017-09-01 DIAGNOSIS — C61 Malignant neoplasm of prostate: Secondary | ICD-10-CM | POA: Diagnosis not present

## 2017-09-03 ENCOUNTER — Encounter (HOSPITAL_COMMUNITY)
Admission: RE | Admit: 2017-09-03 | Discharge: 2017-09-03 | Disposition: A | Discharge status: Home / Self Care | Payer: Medicare Other | Source: Ambulatory Visit | Attending: Urology | Admitting: Urology

## 2017-09-03 DIAGNOSIS — C61 Malignant neoplasm of prostate: Secondary | ICD-10-CM | POA: Diagnosis not present

## 2017-09-03 DIAGNOSIS — N281 Cyst of kidney, acquired: Secondary | ICD-10-CM | POA: Diagnosis not present

## 2017-09-03 MED ORDER — TECHNETIUM TC 99M MEDRONATE IV KIT
25.0000 | PACK | Freq: Once | INTRAVENOUS | Status: AC | PRN
  Administered 2017-09-03: 25 via INTRAVENOUS

## 2017-09-08 DIAGNOSIS — C61 Malignant neoplasm of prostate: Secondary | ICD-10-CM | POA: Diagnosis not present

## 2017-09-08 DIAGNOSIS — J069 Acute upper respiratory infection, unspecified: Secondary | ICD-10-CM | POA: Diagnosis not present

## 2017-09-08 DIAGNOSIS — Z6836 Body mass index (BMI) 36.0-36.9, adult: Secondary | ICD-10-CM | POA: Diagnosis not present

## 2017-09-08 DIAGNOSIS — Z719 Counseling, unspecified: Secondary | ICD-10-CM | POA: Diagnosis not present

## 2017-09-08 DIAGNOSIS — F17290 Nicotine dependence, other tobacco product, uncomplicated: Secondary | ICD-10-CM | POA: Diagnosis not present

## 2017-09-08 DIAGNOSIS — J449 Chronic obstructive pulmonary disease, unspecified: Secondary | ICD-10-CM | POA: Diagnosis not present

## 2017-09-08 DIAGNOSIS — E6609 Other obesity due to excess calories: Secondary | ICD-10-CM | POA: Diagnosis not present

## 2017-09-11 DIAGNOSIS — Z6835 Body mass index (BMI) 35.0-35.9, adult: Secondary | ICD-10-CM | POA: Diagnosis not present

## 2017-09-11 DIAGNOSIS — H1033 Unspecified acute conjunctivitis, bilateral: Secondary | ICD-10-CM | POA: Diagnosis not present

## 2017-09-11 DIAGNOSIS — E6609 Other obesity due to excess calories: Secondary | ICD-10-CM | POA: Diagnosis not present

## 2017-09-11 DIAGNOSIS — J069 Acute upper respiratory infection, unspecified: Secondary | ICD-10-CM | POA: Diagnosis not present

## 2017-09-15 MED ORDER — MONTELUKAST SODIUM 10 MG PO TABS
ORAL_TABLET | 0 refills | Status: AC
Start: 2017-09-15 — End: 2017-12-16

## 2017-10-22 NOTE — Progress Notes
I saw and evaluated  Maurice Ramirez.  I discussed the case with the fellow and agree with the findings and plan of care as documented in the resident's note.       Yohann Curl R. Allysha Tryon

## 2017-10-28 ENCOUNTER — Telehealth: Payer: BLUE CROSS/BLUE SHIELD

## 2017-10-28 NOTE — Telephone Encounter
Refill Request    Verified patient was seen within the last 6 months. If not seen within 6 months, offered appointment.    ??? Collected prescription information from patient.    ??? Pended orders for provider to review and sign.    ??? Pharmacy location was confirmed and class was set for pended orders.    Patient has been notified of the 24-48 hour turnaround time.

## 2017-10-30 ENCOUNTER — Ambulatory Visit: Payer: BLUE CROSS/BLUE SHIELD

## 2017-10-31 MED ORDER — ALBUTEROL SULFATE HFA 108 (90 BASE) MCG/ACT IN AERS
2 | Freq: Four times a day (QID) | RESPIRATORY_TRACT | 3 refills | Status: AC | PRN
Start: 2017-10-31 — End: 2018-06-02

## 2017-12-12 DIAGNOSIS — M47816 Spondylosis without myelopathy or radiculopathy, lumbar region: Secondary | ICD-10-CM | POA: Diagnosis not present

## 2017-12-12 DIAGNOSIS — M5136 Other intervertebral disc degeneration, lumbar region: Secondary | ICD-10-CM | POA: Diagnosis not present

## 2017-12-12 DIAGNOSIS — M48062 Spinal stenosis, lumbar region with neurogenic claudication: Secondary | ICD-10-CM | POA: Diagnosis not present

## 2017-12-12 DIAGNOSIS — M549 Dorsalgia, unspecified: Secondary | ICD-10-CM | POA: Diagnosis not present

## 2017-12-12 DIAGNOSIS — M546 Pain in thoracic spine: Secondary | ICD-10-CM | POA: Diagnosis not present

## 2017-12-15 MED ORDER — MONTELUKAST SODIUM 10 MG PO TABS
ORAL_TABLET | 0 refills | Status: AC
Start: 2017-12-15 — End: 2018-03-17

## 2017-12-18 ENCOUNTER — Other Ambulatory Visit: Payer: Self-pay | Admitting: Neurosurgery

## 2017-12-18 DIAGNOSIS — M48062 Spinal stenosis, lumbar region with neurogenic claudication: Secondary | ICD-10-CM

## 2017-12-18 DIAGNOSIS — G9519 Other vascular myelopathies: Secondary | ICD-10-CM

## 2017-12-19 ENCOUNTER — Ambulatory Visit
Admission: RE | Admit: 2017-12-19 | Discharge: 2017-12-19 | Disposition: A | Discharge status: Home / Self Care | Payer: Medicare Other | Source: Ambulatory Visit | Attending: Neurosurgery | Admitting: Neurosurgery

## 2017-12-19 DIAGNOSIS — M48061 Spinal stenosis, lumbar region without neurogenic claudication: Secondary | ICD-10-CM | POA: Diagnosis not present

## 2017-12-19 DIAGNOSIS — M48062 Spinal stenosis, lumbar region with neurogenic claudication: Secondary | ICD-10-CM

## 2017-12-19 DIAGNOSIS — G9519 Other vascular myelopathies: Secondary | ICD-10-CM

## 2017-12-31 DIAGNOSIS — Z6836 Body mass index (BMI) 36.0-36.9, adult: Secondary | ICD-10-CM | POA: Diagnosis not present

## 2017-12-31 DIAGNOSIS — I10 Essential (primary) hypertension: Secondary | ICD-10-CM | POA: Diagnosis not present

## 2017-12-31 DIAGNOSIS — M5136 Other intervertebral disc degeneration, lumbar region: Secondary | ICD-10-CM | POA: Diagnosis not present

## 2017-12-31 DIAGNOSIS — M48062 Spinal stenosis, lumbar region with neurogenic claudication: Secondary | ICD-10-CM | POA: Diagnosis not present

## 2017-12-31 DIAGNOSIS — M47816 Spondylosis without myelopathy or radiculopathy, lumbar region: Secondary | ICD-10-CM | POA: Diagnosis not present

## 2018-01-01 ENCOUNTER — Other Ambulatory Visit: Payer: Self-pay | Admitting: Neurosurgery

## 2018-01-15 DIAGNOSIS — M545 Low back pain: Secondary | ICD-10-CM | POA: Diagnosis not present

## 2018-01-15 DIAGNOSIS — Z1389 Encounter for screening for other disorder: Secondary | ICD-10-CM | POA: Diagnosis not present

## 2018-01-15 DIAGNOSIS — E6609 Other obesity due to excess calories: Secondary | ICD-10-CM | POA: Diagnosis not present

## 2018-01-15 DIAGNOSIS — J302 Other seasonal allergic rhinitis: Secondary | ICD-10-CM | POA: Diagnosis not present

## 2018-01-15 DIAGNOSIS — C61 Malignant neoplasm of prostate: Secondary | ICD-10-CM | POA: Diagnosis not present

## 2018-01-15 DIAGNOSIS — Z6836 Body mass index (BMI) 36.0-36.9, adult: Secondary | ICD-10-CM | POA: Diagnosis not present

## 2018-01-15 DIAGNOSIS — R32 Unspecified urinary incontinence: Secondary | ICD-10-CM | POA: Diagnosis not present

## 2018-02-12 DIAGNOSIS — C61 Malignant neoplasm of prostate: Secondary | ICD-10-CM | POA: Diagnosis not present

## 2018-02-12 DIAGNOSIS — N529 Male erectile dysfunction, unspecified: Secondary | ICD-10-CM | POA: Diagnosis not present

## 2018-02-12 DIAGNOSIS — Z6836 Body mass index (BMI) 36.0-36.9, adult: Secondary | ICD-10-CM | POA: Diagnosis not present

## 2018-02-12 DIAGNOSIS — F329 Major depressive disorder, single episode, unspecified: Secondary | ICD-10-CM | POA: Diagnosis not present

## 2018-02-12 DIAGNOSIS — E6609 Other obesity due to excess calories: Secondary | ICD-10-CM | POA: Diagnosis not present

## 2018-02-12 DIAGNOSIS — R05 Cough: Secondary | ICD-10-CM | POA: Diagnosis not present

## 2018-03-10 DIAGNOSIS — C61 Malignant neoplasm of prostate: Secondary | ICD-10-CM | POA: Diagnosis not present

## 2018-03-16 DIAGNOSIS — N3040 Irradiation cystitis without hematuria: Secondary | ICD-10-CM | POA: Diagnosis not present

## 2018-03-16 DIAGNOSIS — C61 Malignant neoplasm of prostate: Secondary | ICD-10-CM | POA: Diagnosis not present

## 2018-03-16 DIAGNOSIS — N5201 Erectile dysfunction due to arterial insufficiency: Secondary | ICD-10-CM | POA: Diagnosis not present

## 2018-03-16 DIAGNOSIS — N281 Cyst of kidney, acquired: Secondary | ICD-10-CM | POA: Diagnosis not present

## 2018-03-16 DIAGNOSIS — N393 Stress incontinence (female) (male): Secondary | ICD-10-CM | POA: Diagnosis not present

## 2018-03-16 MED ORDER — MONTELUKAST SODIUM 10 MG PO TABS
ORAL_TABLET | 0 refills | Status: AC
Start: 2018-03-16 — End: 2018-06-16

## 2018-03-16 MED ORDER — ADVAIR DISKUS 500-50 MCG/DOSE IN AEPB
3 refills | Status: AC
Start: 2018-03-16 — End: 2018-08-21

## 2018-03-26 ENCOUNTER — Other Ambulatory Visit (HOSPITAL_COMMUNITY): Payer: Self-pay | Admitting: Family Medicine

## 2018-03-26 ENCOUNTER — Ambulatory Visit (HOSPITAL_COMMUNITY)
Admission: RE | Admit: 2018-03-26 | Discharge: 2018-03-26 | Disposition: A | Discharge status: Home / Self Care | Payer: Medicare Other | Source: Ambulatory Visit | Attending: Family Medicine | Admitting: Family Medicine

## 2018-03-26 DIAGNOSIS — R059 Cough, unspecified: Secondary | ICD-10-CM

## 2018-03-26 DIAGNOSIS — Z6836 Body mass index (BMI) 36.0-36.9, adult: Secondary | ICD-10-CM | POA: Diagnosis not present

## 2018-03-26 DIAGNOSIS — R05 Cough: Secondary | ICD-10-CM | POA: Insufficient documentation

## 2018-03-26 DIAGNOSIS — E6609 Other obesity due to excess calories: Secondary | ICD-10-CM | POA: Diagnosis not present

## 2018-03-26 DIAGNOSIS — R918 Other nonspecific abnormal finding of lung field: Secondary | ICD-10-CM | POA: Insufficient documentation

## 2018-04-10 ENCOUNTER — Other Ambulatory Visit (HOSPITAL_COMMUNITY): Payer: Self-pay | Admitting: Family Medicine

## 2018-04-10 ENCOUNTER — Ambulatory Visit (HOSPITAL_COMMUNITY)
Admission: RE | Admit: 2018-04-10 | Discharge: 2018-04-10 | Disposition: A | Discharge status: Home / Self Care | Payer: Medicare Other | Source: Ambulatory Visit | Attending: Family Medicine | Admitting: Family Medicine

## 2018-04-10 DIAGNOSIS — Z1389 Encounter for screening for other disorder: Secondary | ICD-10-CM | POA: Insufficient documentation

## 2018-04-10 DIAGNOSIS — I10 Essential (primary) hypertension: Secondary | ICD-10-CM | POA: Diagnosis not present

## 2018-04-10 DIAGNOSIS — J189 Pneumonia, unspecified organism: Secondary | ICD-10-CM | POA: Diagnosis not present

## 2018-04-10 DIAGNOSIS — R918 Other nonspecific abnormal finding of lung field: Secondary | ICD-10-CM | POA: Insufficient documentation

## 2018-04-10 DIAGNOSIS — J188 Other pneumonia, unspecified organism: Secondary | ICD-10-CM

## 2018-04-10 DIAGNOSIS — C61 Malignant neoplasm of prostate: Secondary | ICD-10-CM | POA: Diagnosis not present

## 2018-04-10 DIAGNOSIS — R05 Cough: Secondary | ICD-10-CM | POA: Diagnosis not present

## 2018-04-10 DIAGNOSIS — Z0001 Encounter for general adult medical examination with abnormal findings: Secondary | ICD-10-CM | POA: Diagnosis not present

## 2018-04-10 DIAGNOSIS — Z6837 Body mass index (BMI) 37.0-37.9, adult: Secondary | ICD-10-CM | POA: Diagnosis not present

## 2018-04-10 DIAGNOSIS — Z719 Counseling, unspecified: Secondary | ICD-10-CM | POA: Diagnosis not present

## 2018-04-15 DIAGNOSIS — R7301 Impaired fasting glucose: Secondary | ICD-10-CM | POA: Diagnosis not present

## 2018-04-15 DIAGNOSIS — R7309 Other abnormal glucose: Secondary | ICD-10-CM | POA: Diagnosis not present

## 2018-04-15 DIAGNOSIS — E669 Obesity, unspecified: Secondary | ICD-10-CM | POA: Diagnosis not present

## 2018-04-15 DIAGNOSIS — Z0001 Encounter for general adult medical examination with abnormal findings: Secondary | ICD-10-CM | POA: Diagnosis not present

## 2018-04-15 DIAGNOSIS — Z1389 Encounter for screening for other disorder: Secondary | ICD-10-CM | POA: Diagnosis not present

## 2018-04-23 DIAGNOSIS — J449 Chronic obstructive pulmonary disease, unspecified: Secondary | ICD-10-CM | POA: Diagnosis not present

## 2018-04-23 DIAGNOSIS — J069 Acute upper respiratory infection, unspecified: Secondary | ICD-10-CM | POA: Diagnosis not present

## 2018-04-23 DIAGNOSIS — Z6837 Body mass index (BMI) 37.0-37.9, adult: Secondary | ICD-10-CM | POA: Diagnosis not present

## 2018-04-23 DIAGNOSIS — Z23 Encounter for immunization: Secondary | ICD-10-CM | POA: Diagnosis not present

## 2018-04-24 NOTE — Pre-Procedure Instructions (Signed)
CELEDONIO SORTINO  04/24/2018      [facility] [place] [address] [place] [address] Phone: [phone] Fax: [phone]    Your procedure is scheduled on July 22  Report to [facility] [place].M.  Call this number if you have problems the morning of surgery:  [phone]   Remember:  Do not eat or drink after midnight.      Take these medicines the morning of surgery with A SIP OF WATER cetirizine (ZYRTEC) ranitidine (ZANTAC)  7 days prior to surgery STOP taking any Aspirin(unless otherwise instructed by your surgeon), Aleve, Naproxen, Ibuprofen, Motrin, Advil, Goody's, BC's, all herbal medications, fish oil, and all vitamins, meloxicam (MOBIC)     Do not wear jewelry  Do not wear lotions, powders, or cologne, or deodorant.  Men may shave face and neck.  Do not bring valuables to the hospital.  [facility] is not responsible for any belongings or valuables.  Contacts, dentures or bridgework may not be worn into surgery.  Leave your suitcase in the car.  After surgery it may be brought to your room.  For patients admitted to the hospital, discharge time will be determined by your treatment team.  Patients discharged the day of surgery will not be allowed to drive home.    Special instructions:   [place]- Preparing For Surgery  Before surgery, you can play an important role. Because skin is not sterile, your skin needs to be as free of germs as possible. You can reduce the number of germs on your skin by washing with CHG (chlorahexidine gluconate) Soap before surgery.  CHG is an antiseptic cleaner which kills germs and bonds with the skin to continue killing germs even after washing.    Oral Hygiene is also important to reduce your risk of infection.  Remember - BRUSH YOUR TEETH THE MORNING OF SURGERY WITH YOUR REGULAR TOOTHPASTE  Please do not use if you have an allergy to CHG or antibacterial soaps.  If your skin becomes reddened/irritated stop using the CHG.  Do not shave (including legs and underarms) for at least 48 hours prior to first CHG shower. It is OK to shave your face.  Please follow these instructions carefully.   1. Shower the NIGHT BEFORE SURGERY and the MORNING OF SURGERY with CHG.   2. If you chose to wash your hair, wash your hair first as usual with your normal shampoo.  3. After you shampoo, rinse your hair and body thoroughly to remove the shampoo.  4. Use CHG as you would any other liquid soap. You can apply CHG directly to the skin and wash gently with a scrungie or a clean washcloth.   5. Apply the CHG Soap to your body ONLY FROM THE NECK DOWN.  Do not use on open wounds or open sores. Avoid contact with your eyes, ears, mouth and genitals (private parts). Wash Face and genitals (private parts)  with your normal soap.  6. Wash thoroughly, paying special attention to the area where your surgery will be performed.  7. Thoroughly rinse your body with warm water from the neck down.  8. DO NOT shower/wash with your normal soap after using and rinsing off the CHG Soap.  9. Pat yourself dry with a CLEAN TOWEL.  10. Wear CLEAN PAJAMAS to bed the night before surgery, wear comfortable clothes the morning of surgery  11. Place CLEAN SHEETS on  your bed the night of your first shower and DO NOT SLEEP WITH PETS.    Day of Surgery:  Do not apply any deodorants/lotions.  Please wear clean clothes to the hospital/surgery center.   Remember to brush your teeth WITH YOUR REGULAR TOOTHPASTE.    Please read over the following fact sheets that you were given.

## 2018-04-27 ENCOUNTER — Other Ambulatory Visit: Payer: Self-pay

## 2018-04-27 ENCOUNTER — Encounter (HOSPITAL_COMMUNITY): Payer: Self-pay

## 2018-04-27 ENCOUNTER — Other Ambulatory Visit: Payer: Self-pay | Admitting: Neurosurgery

## 2018-04-27 ENCOUNTER — Encounter (HOSPITAL_COMMUNITY)
Admission: RE | Admit: 2018-04-27 | Discharge: 2018-04-27 | Disposition: A | Discharge status: Home / Self Care | Payer: Medicare Other | Source: Ambulatory Visit | Attending: Neurosurgery | Admitting: Neurosurgery

## 2018-04-27 DIAGNOSIS — Z9049 Acquired absence of other specified parts of digestive tract: Secondary | ICD-10-CM | POA: Diagnosis not present

## 2018-04-27 DIAGNOSIS — E7800 Pure hypercholesterolemia, unspecified: Secondary | ICD-10-CM | POA: Diagnosis not present

## 2018-04-27 DIAGNOSIS — Z9889 Other specified postprocedural states: Secondary | ICD-10-CM | POA: Insufficient documentation

## 2018-04-27 DIAGNOSIS — I10 Essential (primary) hypertension: Secondary | ICD-10-CM | POA: Insufficient documentation

## 2018-04-27 DIAGNOSIS — Z87442 Personal history of urinary calculi: Secondary | ICD-10-CM | POA: Diagnosis not present

## 2018-04-27 DIAGNOSIS — Z79899 Other long term (current) drug therapy: Secondary | ICD-10-CM | POA: Insufficient documentation

## 2018-04-27 DIAGNOSIS — F17210 Nicotine dependence, cigarettes, uncomplicated: Secondary | ICD-10-CM | POA: Insufficient documentation

## 2018-04-27 DIAGNOSIS — Z01810 Encounter for preprocedural cardiovascular examination: Secondary | ICD-10-CM | POA: Diagnosis not present

## 2018-04-27 DIAGNOSIS — R7303 Prediabetes: Secondary | ICD-10-CM | POA: Diagnosis not present

## 2018-04-27 DIAGNOSIS — M48062 Spinal stenosis, lumbar region with neurogenic claudication: Secondary | ICD-10-CM | POA: Insufficient documentation

## 2018-04-27 DIAGNOSIS — Z9079 Acquired absence of other genital organ(s): Secondary | ICD-10-CM | POA: Insufficient documentation

## 2018-04-27 DIAGNOSIS — Z01812 Encounter for preprocedural laboratory examination: Secondary | ICD-10-CM | POA: Diagnosis not present

## 2018-04-27 DIAGNOSIS — G4733 Obstructive sleep apnea (adult) (pediatric): Secondary | ICD-10-CM | POA: Diagnosis not present

## 2018-04-27 DIAGNOSIS — Z8546 Personal history of malignant neoplasm of prostate: Secondary | ICD-10-CM | POA: Insufficient documentation

## 2018-04-27 HISTORY — DX: Spinal stenosis, lumbar region with neurogenic claudication: M48.062

## 2018-04-27 HISTORY — DX: Pneumonia, unspecified organism: J18.9

## 2018-04-27 HISTORY — DX: Prediabetes: R73.03

## 2018-04-27 HISTORY — DX: Personal history of urinary calculi: Z87.442

## 2018-04-27 HISTORY — DX: Sleep apnea, unspecified: G47.30

## 2018-04-27 LAB — COMPREHENSIVE METABOLIC PANEL
ALT: 25 U/L (ref 0–44)
AST: 21 U/L (ref 15–41)
Albumin: 3.7 g/dL (ref 3.5–5.0)
Alkaline Phosphatase: 51 U/L (ref 38–126)
Anion gap: 11 (ref 5–15)
BUN: 27 mg/dL — ABNORMAL HIGH (ref 8–23)
CO2: 28 mmol/L (ref 22–32)
Calcium: 9 mg/dL (ref 8.9–10.3)
Chloride: 100 mmol/L (ref 98–111)
Creatinine, Ser: 1.4 mg/dL — ABNORMAL HIGH (ref 0.61–1.24)
GFR calc Af Amer: 59 mL/min — ABNORMAL LOW (ref >60)
GFR calc non Af Amer: 50 mL/min — ABNORMAL LOW (ref >60)
Glucose, Bld: 126 mg/dL — ABNORMAL HIGH (ref 70–99)
Potassium: 4.2 mmol/L (ref 3.5–5.1)
Sodium: 139 mmol/L (ref 135–145)
Total Bilirubin: 0.7 mg/dL (ref 0.3–1.2)
Total Protein: 6.7 g/dL (ref 6.5–8.1)

## 2018-04-27 LAB — CBC
HCT: 48.3 % (ref 39.0–52.0)
Hemoglobin: 15.5 g/dL (ref 13.0–17.0)
MCH: 30.6 pg (ref 26.0–34.0)
MCHC: 32.1 g/dL (ref 30.0–36.0)
MCV: 95.5 fL (ref 78.0–100.0)
Platelets: 236 10*3/uL (ref 150–400)
RBC: 5.06 MIL/uL (ref 4.22–5.81)
RDW: 13.6 % (ref 11.5–15.5)
WBC: 8.3 10*3/uL (ref 4.0–10.5)

## 2018-04-27 LAB — ABO/RH: ABO/RH(D): A POS

## 2018-04-27 LAB — TYPE AND SCREEN
ABO/RH(D): A POS
Antibody Screen: NEGATIVE

## 2018-04-27 LAB — SURGICAL PCR SCREEN
MRSA, PCR: NEGATIVE
Staphylococcus aureus: NEGATIVE

## 2018-04-27 NOTE — Progress Notes (Signed)
PCP - [facility] [place]  Cardiologist - Denies  Chest x-ray - 04/10/18 (E)  EKG - 04/27/18  Stress Test - 07/2010 (E)  ECHO - 02/2013 (E)  Cardiac Cath - Denies  Sleep Study - Yes, Positive CPAP - None  LABS- 04/27/18: CBC, CMP, T/S  ASA- Denies   Anesthesia- Yes- Currently taking Doxycycline for Pneumonia  Pt denies having chest pain, sob, or fever at this time. All instructions explained to the pt, with a verbal understanding of the material. Pt agrees to go over the instructions while at home for a better understanding. The opportunity to ask questions was provided.

## 2018-04-28 NOTE — Progress Notes (Signed)
Anesthesia Chart Review:   Case:  [id number] Date/Time:  06/01/18 0715   Procedure:  LUMBAR 4- LUMBAR 5 DECOMPRESSION, POSTERIOR LUMBAR INTERBODY FUSION AND POSTERIOR LATERAL ARTHRODESIS (N/A Back) - LUMBAR 4- LUMBAR 5 DECOMPRESSION, POSTERIOR LUMBAR INTERBODY FUSION AND POSTERIOR LATERAL ARTHRODESIS   Anesthesia type:  General   Pre-op diagnosis:  LUMBAR STENOSIS WITH NEUROGENIC CLAUDICATION   Location:  [place] OR ROOM 79 / [place] [facility]   Surgeon:  [name], MD      DISCUSSION: - Pt is a 67 year male with hx HTN, pre-diabetes, OSA, prostate cancer. Current smoker.   - I saw pt in pre-admission testing. Pt reported he was prescribed antibiotics last week for pneumonia.  Pt reports he has been having trouble with respiratory infections for several months.  Has been on 3 antibiotics.  Does not feel like any of them have helped.  Reports frequent wheezing. Pt is a current smoker.  He reports he was referred to pulmonology but hasn't gone, and he was Rx an inhaler, but he didn't get it due to cost.  Pt in no acute respiratory distress. Lungs with diffuse mild wheezing throughout, with good air movement throughout.   - I discussed pt's pulmonary status with Dr. Donnella Bi office. Per Lexine Baton, pt's surgery has been moved to August in order to give pt time to see pulmonology and improve respiratory status.    VS: BP 104/63   Pulse 98   Temp 36.7 C   Resp 20   Ht 5\' 8"  (1.727 m)   Wt 234 lb 8 oz (106.4 kg)   SpO2 93%   BMI 35.66 kg/m   PROVIDERS: PCP is Jake Samples, PA-C   LABS: Labs reviewed: Acceptable for surgery. (all labs ordered are listed, but only abnormal results are displayed)  Labs Reviewed  COMPREHENSIVE METABOLIC PANEL - Abnormal; Notable for the following components:      Result Value   Glucose, Bld 126 (*)    BUN 27 (*)    Creatinine, Ser 1.40 (*)    GFR calc non Af Amer 50 (*)    GFR calc Af Amer 59 (*)    All other components within normal limits  SURGICAL PCR  SCREEN  CBC  TYPE AND SCREEN  ABO/RH     IMAGES:  CXR 04/10/18:  - Continued mild changes in the bases bilaterally. Continued follow-up in 3-4 weeks is recommended.   EKG 04/27/18: NSR. Sinus arrhythmia   CV:  Echo 02/12/13:  - Left ventricle: The cavity size was mildly reduced. Wallthickness was increased in a pattern of mild LVH. Therewas moderate concentric hypertrophy. Systolic function wasnormal. The estimated ejection fraction was in the rangeof 55% to 60%. Wall motion was normal; there were noregional wall motion abnormalities. Doppler parameters areconsistent with abnormal left ventricular relaxation(grade 1 diastolic dysfunction). - Mitral valve: Mild regurgitation. - Atrial septum: No defect or patent foramen ovale was identified. - Impressions: No cardiac source of emboli was identified.Mild LVH and diastolic relaxation abnormality.No significant valvular disease.  Carotid duplex 01/27/13:  - Minimal plaque formation right carotid bifurcation. - Scattered intimal thickening bilaterally. - No evidence of hemodynamically significant stenosis.   Past Medical History:  Diagnosis Date  . Arthritis   . Cancer [facility])    Prostate  . Colon polyps   . Cough   . GERD (gastroesophageal reflux disease)    occasional   . H/O tooth extraction week of 03/15/2015   . History of kidney stones   . Hypercholesteremia   .  Hypertension   . Lumbar stenosis with neurogenic claudication   . Pneumonia   . Pre-diabetes   . Renal disorder   . Sleep apnea    No cpap    Past Surgical History:  Procedure Laterality Date  . APPENDECTOMY    . BACK SURGERY    . CHOLECYSTECTOMY    . COLONOSCOPY  02/04/2012   Procedure: COLONOSCOPY;  Surgeon: Jamesetta So, MD;  Location: [facility];  Service: Gastroenterology;  Laterality: N/A;  . COLONOSCOPY W/ POLYPECTOMY    . HERNIA REPAIR     Bilateral inguinal hernia repair X 4  . hydrocelectomy    . left knee arthroscopy    .  LITHOTRIPSY    . LYMPHADENECTOMY Bilateral 03/22/2015   Procedure: PELVIC LYMPHADENECTOMY;  Surgeon: Alexis Frock, MD;  Location: [facility] ORS;  Service: Urology;  Laterality: Bilateral;  . NM MYOVIEW LTD  2011  . ROBOT ASSISTED LAPAROSCOPIC RADICAL PROSTATECTOMY N/A 03/22/2015   Procedure: ROBOTIC ASSISTED LAPAROSCOPIC RADICAL PROSTATECTOMY WITH INDOCYANINE GREEN DYE INJECTION;  Surgeon: Alexis Frock, MD;  Location: [facility] ORS;  Service: Urology;  Laterality: N/A;  . ROBOTIC ASSISTED LAPAROSCOPIC LYSIS OF ADHESION  03/22/2015   Procedure: ROBOTIC ASSISTED LAPAROSCOPIC LYSIS OF ADHESION;  Surgeon: Alexis Frock, MD;  Location: [facility] ORS;  Service: Urology;;  . [name]      MEDICATIONS: . doxycycline (DORYX) 100 MG EC tablet  . cetirizine (ZYRTEC) 10 MG tablet  . lisinopril-hydrochlorothiazide (PRINZIDE,ZESTORETIC) 20-25 MG per tablet  . meloxicam (MOBIC) 15 MG tablet  . ranitidine (ZANTAC) 150 MG tablet   No current facility-administered medications for this encounter.     Willeen Cass, FNP-BC [facility]/Anesthesiology Phone: [phone] 04/28/2018 10:37 AM

## 2018-04-30 DIAGNOSIS — M545 Low back pain: Secondary | ICD-10-CM | POA: Diagnosis not present

## 2018-04-30 DIAGNOSIS — E6609 Other obesity due to excess calories: Secondary | ICD-10-CM | POA: Diagnosis not present

## 2018-04-30 DIAGNOSIS — J449 Chronic obstructive pulmonary disease, unspecified: Secondary | ICD-10-CM | POA: Diagnosis not present

## 2018-04-30 DIAGNOSIS — I10 Essential (primary) hypertension: Secondary | ICD-10-CM | POA: Diagnosis not present

## 2018-04-30 DIAGNOSIS — Z6836 Body mass index (BMI) 36.0-36.9, adult: Secondary | ICD-10-CM | POA: Diagnosis not present

## 2018-04-30 DIAGNOSIS — R05 Cough: Secondary | ICD-10-CM | POA: Diagnosis not present

## 2018-04-30 DIAGNOSIS — C61 Malignant neoplasm of prostate: Secondary | ICD-10-CM | POA: Diagnosis not present

## 2018-05-06 DIAGNOSIS — R05 Cough: Secondary | ICD-10-CM | POA: Diagnosis not present

## 2018-05-06 DIAGNOSIS — Z1211 Encounter for screening for malignant neoplasm of colon: Secondary | ICD-10-CM | POA: Diagnosis not present

## 2018-05-06 DIAGNOSIS — E6609 Other obesity due to excess calories: Secondary | ICD-10-CM | POA: Diagnosis not present

## 2018-05-06 DIAGNOSIS — Z6836 Body mass index (BMI) 36.0-36.9, adult: Secondary | ICD-10-CM | POA: Diagnosis not present

## 2018-05-18 DIAGNOSIS — M545 Low back pain: Secondary | ICD-10-CM | POA: Diagnosis not present

## 2018-05-18 DIAGNOSIS — I10 Essential (primary) hypertension: Secondary | ICD-10-CM | POA: Diagnosis not present

## 2018-05-18 DIAGNOSIS — J449 Chronic obstructive pulmonary disease, unspecified: Secondary | ICD-10-CM | POA: Diagnosis not present

## 2018-05-18 DIAGNOSIS — C61 Malignant neoplasm of prostate: Secondary | ICD-10-CM | POA: Diagnosis not present

## 2018-05-19 DIAGNOSIS — K136 Irritative hyperplasia of oral mucosa: Secondary | ICD-10-CM | POA: Diagnosis not present

## 2018-05-29 ENCOUNTER — Encounter (HOSPITAL_COMMUNITY): Payer: Self-pay | Admitting: *Deleted

## 2018-05-29 ENCOUNTER — Other Ambulatory Visit: Payer: Self-pay | Admitting: Neurosurgery

## 2018-05-29 ENCOUNTER — Other Ambulatory Visit: Payer: Self-pay

## 2018-05-29 NOTE — Progress Notes (Signed)
Spoke with pt for pre-op call. Pt was seen here on 04/27/18 for PAT appt. Surgery was moved due to pt needing pulmonary clearance. Pt has seen Dr. [name] and clearance note is in chart. Pt's med list updated. Pt instructed to stop Herbal medications and NSAIDS prior to surgery. Also instructed pt not to smoke 24 hours prior to surgery.

## 2018-05-29 NOTE — Progress Notes (Addendum)
Anesthesia Follow-up: Patient's initial PAT visit was on 04/27/18. See PAT note by Willeen Cass, NP signed on 04/28/18.   Patient is scheduled for: Procedure:  LUMBAR 4- LUMBAR 5 DECOMPRESSION, POSTERIOR LUMBAR INTERBODY FUSION AND POSTERIOR LATERAL ARTHRODESIS (N/A Back) - LUMBAR 4- LUMBAR 5 DECOMPRESSION, POSTERIOR LUMBAR INTERBODY FUSION AND POSTERIOR LATERAL ARTHRODESIS  Procedure was initially scheduled for 05/04/18 with Mark Gamma, MD, but it was moved to 06/01/18 to allow time for patient to be re-evaluated by pulmonology following recent treatment for pneumonia with on-going intermittent wheezing. He is a current smoker.   Since then Mark Cannon was evaluated by pulmonologist Mark Du, MD, last on 05/18/18 for follow-up. He was doing better after starting an inhaler (Anoro Ellipta), but was still unable to totally stop smoking. By notes, office PFT showed "moderate COPD." Dr. Luan Cannon wrote, "He is OK for a planned surgery but he is higher than average risk." He is planning for patient to undergo lung cancer screening in the future.   Previous CV studies: EKG 04/27/18: NSR. Sinus arrhythmia  Echo 02/12/13: Study Conclusions:  - Left ventricle: The cavity size was mildly reduced. Wallthickness was increased in a pattern of mild LVH. Therewas moderate concentric hypertrophy. Systolic function wasnormal. The estimated ejection fraction was in the rangeof 55% to 60%. Wall motion was normal; there were noregional wall motion abnormalities. Doppler parameters areconsistent with abnormal left ventricular relaxation(grade 1 diastolic dysfunction). - Mitral valve: Mild regurgitation. - Atrial septum: No defect or patent foramen ovale was identified. - Impressions: No cardiac source of emboli was identified.Mild LVH and diastolic relaxation abnormality.No significant valvular disease.  Carotid duplex 01/27/13:  IMPRESSION: Minimal plaque formation right carotid bifurcation. Scattered  intimal thickening bilaterally. No evidence of hemodynamically significant stenosis.  Nuclear stress test 08/10/10:  IMPRESSION: Normal Myocardial Perfusion Study. Post-stress EF 54%.   Patient will need updated labs prior to surgery. He is a same day work-up, so further evaluation by his anesthesiologist on the day of surgery.   Mark Cannon [facility] Short Stay Center/Anesthesiology Phone [phone] 05/29/2018 12:01 PM

## 2018-06-01 ENCOUNTER — Inpatient Hospital Stay (HOSPITAL_COMMUNITY): Payer: Medicare Other | Admitting: Vascular Surgery

## 2018-06-01 ENCOUNTER — Inpatient Hospital Stay (HOSPITAL_COMMUNITY): Payer: Medicare Other

## 2018-06-01 ENCOUNTER — Inpatient Hospital Stay (HOSPITAL_COMMUNITY)
Admission: RE | Disposition: A | Discharge status: Home / Self Care | Payer: Self-pay | Source: Home / Self Care | Attending: Neurosurgery

## 2018-06-01 ENCOUNTER — Inpatient Hospital Stay (HOSPITAL_COMMUNITY): Payer: Medicare Other | Admitting: Emergency Medicine

## 2018-06-01 ENCOUNTER — Other Ambulatory Visit: Payer: Self-pay

## 2018-06-01 ENCOUNTER — Encounter (HOSPITAL_COMMUNITY): Payer: Self-pay | Admitting: *Deleted

## 2018-06-01 ENCOUNTER — Inpatient Hospital Stay (HOSPITAL_COMMUNITY)
Admission: RE | Admit: 2018-06-01 | Discharge: 2018-06-02 | DRG: 455 | Disposition: A | Discharge status: Home / Self Care | Payer: Medicare Other | Attending: Neurosurgery | Admitting: Neurosurgery

## 2018-06-01 DIAGNOSIS — M5136 Other intervertebral disc degeneration, lumbar region: Secondary | ICD-10-CM | POA: Diagnosis not present

## 2018-06-01 DIAGNOSIS — Z8249 Family history of ischemic heart disease and other diseases of the circulatory system: Secondary | ICD-10-CM

## 2018-06-01 DIAGNOSIS — M4306 Spondylolysis, lumbar region: Secondary | ICD-10-CM | POA: Diagnosis not present

## 2018-06-01 DIAGNOSIS — Z981 Arthrodesis status: Secondary | ICD-10-CM | POA: Diagnosis not present

## 2018-06-01 DIAGNOSIS — M5126 Other intervertebral disc displacement, lumbar region: Secondary | ICD-10-CM | POA: Diagnosis not present

## 2018-06-01 DIAGNOSIS — Z791 Long term (current) use of non-steroidal anti-inflammatories (NSAID): Secondary | ICD-10-CM | POA: Diagnosis not present

## 2018-06-01 DIAGNOSIS — Z823 Family history of stroke: Secondary | ICD-10-CM

## 2018-06-01 DIAGNOSIS — M5116 Intervertebral disc disorders with radiculopathy, lumbar region: Secondary | ICD-10-CM | POA: Diagnosis not present

## 2018-06-01 DIAGNOSIS — I10 Essential (primary) hypertension: Secondary | ICD-10-CM | POA: Diagnosis present

## 2018-06-01 DIAGNOSIS — J449 Chronic obstructive pulmonary disease, unspecified: Secondary | ICD-10-CM | POA: Diagnosis present

## 2018-06-01 DIAGNOSIS — M48061 Spinal stenosis, lumbar region without neurogenic claudication: Secondary | ICD-10-CM | POA: Diagnosis not present

## 2018-06-01 DIAGNOSIS — M47816 Spondylosis without myelopathy or radiculopathy, lumbar region: Secondary | ICD-10-CM | POA: Diagnosis present

## 2018-06-01 DIAGNOSIS — F17210 Nicotine dependence, cigarettes, uncomplicated: Secondary | ICD-10-CM | POA: Diagnosis present

## 2018-06-01 DIAGNOSIS — K219 Gastro-esophageal reflux disease without esophagitis: Secondary | ICD-10-CM | POA: Diagnosis present

## 2018-06-01 DIAGNOSIS — Z8546 Personal history of malignant neoplasm of prostate: Secondary | ICD-10-CM

## 2018-06-01 DIAGNOSIS — M4807 Spinal stenosis, lumbosacral region: Secondary | ICD-10-CM | POA: Diagnosis not present

## 2018-06-01 DIAGNOSIS — M48062 Spinal stenosis, lumbar region with neurogenic claudication: Principal | ICD-10-CM | POA: Diagnosis present

## 2018-06-01 DIAGNOSIS — Z419 Encounter for procedure for purposes other than remedying health state, unspecified: Secondary | ICD-10-CM

## 2018-06-01 LAB — TYPE AND SCREEN
ABO/RH(D): A POS
Antibody Screen: NEGATIVE

## 2018-06-01 LAB — BASIC METABOLIC PANEL
Anion gap: 10 (ref 5–15)
BUN: 18 mg/dL (ref 8–23)
CO2: 27 mmol/L (ref 22–32)
CREATININE: 1.36 mg/dL — AB (ref 0.61–1.24)
Calcium: 9 mg/dL (ref 8.9–10.3)
Chloride: 104 mmol/L (ref 98–111)
GFR calc Af Amer: >60 mL/min (ref >60)
GFR, EST NON AFRICAN AMERICAN: 52 mL/min — AB (ref >60)
GLUCOSE: 162 mg/dL — AB (ref 70–99)
POTASSIUM: 3.9 mmol/L (ref 3.5–5.1)
Sodium: 141 mmol/L (ref 135–145)

## 2018-06-01 LAB — CBC
HCT: 47.7 % (ref 39.0–52.0)
Hemoglobin: 15.4 g/dL (ref 13.0–17.0)
MCH: 30.8 pg (ref 26.0–34.0)
MCHC: 32.3 g/dL (ref 30.0–36.0)
MCV: 95.4 fL (ref 78.0–100.0)
PLATELETS: 236 10*3/uL (ref 150–400)
RBC: 5 MIL/uL (ref 4.22–5.81)
RDW: 13.5 % (ref 11.5–15.5)
WBC: 8 10*3/uL (ref 4.0–10.5)

## 2018-06-01 LAB — HEMOGLOBIN A1C
Hgb A1c MFr Bld: 6.2 % — ABNORMAL HIGH (ref 4.8–5.6)
Mean Plasma Glucose: 131.24 mg/dL

## 2018-06-01 SURGERY — POSTERIOR LUMBAR FUSION 1 LEVEL
Anesthesia: General | Site: Spine Lumbar

## 2018-06-01 MED ORDER — FENTANYL CITRATE (PF) 100 MCG/2ML IJ SOLN
INTRAMUSCULAR | Status: DC | PRN
  Administered 2018-06-01: 50 ug via INTRAVENOUS
  Administered 2018-06-01: 150 ug via INTRAVENOUS

## 2018-06-01 MED ORDER — CHLORHEXIDINE GLUCONATE CLOTH 2 % EX PADS: 6.0000 | MEDICATED_PAD | Freq: Once | CUTANEOUS | Status: DC

## 2018-06-01 MED ORDER — HYDROXYZINE HCL 50 MG/ML IM SOLN: 50.0000 mg | INTRAMUSCULAR | Status: DC | PRN

## 2018-06-01 MED ORDER — HYDROMORPHONE HCL 1 MG/ML IJ SOLN
0.2500 mg | INTRAMUSCULAR | Status: DC | PRN
  Administered 2018-06-01: 0.5 mg via INTRAVENOUS

## 2018-06-01 MED ORDER — LIDOCAINE-EPINEPHRINE 1 %-1:100000 IJ SOLN
INTRAMUSCULAR | Status: AC
  Filled 2018-06-01: qty 1

## 2018-06-01 MED ORDER — SUCCINYLCHOLINE CHLORIDE 20 MG/ML IJ SOLN
INTRAMUSCULAR | Status: DC | PRN
  Administered 2018-06-01: 140 mg via INTRAVENOUS

## 2018-06-01 MED ORDER — SUCCINYLCHOLINE CHLORIDE 200 MG/10ML IV SOSY
PREFILLED_SYRINGE | INTRAVENOUS | Status: AC
  Filled 2018-06-01: qty 10

## 2018-06-01 MED ORDER — SODIUM CHLORIDE 0.9 % IV SOLN
INTRAVENOUS | Status: DC | PRN
  Administered 2018-06-01: 09:00:00

## 2018-06-01 MED ORDER — HYDROCODONE-ACETAMINOPHEN 5-325 MG PO TABS
1.0000 | ORAL_TABLET | ORAL | Status: DC | PRN
  Administered 2018-06-01 – 2018-06-02 (×2): 2 via ORAL
  Filled 2018-06-01 (×2): qty 2

## 2018-06-01 MED ORDER — MENTHOL 3 MG MT LOZG: 1.0000 | LOZENGE | OROMUCOSAL | Status: DC | PRN

## 2018-06-01 MED ORDER — SODIUM CHLORIDE 0.9% FLUSH: 3.0000 mL | INTRAVENOUS | Status: DC | PRN

## 2018-06-01 MED ORDER — ALBUMIN HUMAN 5 % IV SOLN
INTRAVENOUS | Status: DC | PRN
  Administered 2018-06-01 (×2): via INTRAVENOUS

## 2018-06-01 MED ORDER — ACETAMINOPHEN 325 MG PO TABS: 650.0000 mg | ORAL_TABLET | ORAL | Status: DC | PRN

## 2018-06-01 MED ORDER — MAGNESIUM HYDROXIDE 400 MG/5ML PO SUSP
30.0000 mL | Freq: Every day | ORAL | Status: DC | PRN
Start: 2018-06-01 — End: 2018-06-02

## 2018-06-01 MED ORDER — ACETAMINOPHEN 650 MG RE SUPP: 650.0000 mg | RECTAL | Status: DC | PRN

## 2018-06-01 MED ORDER — CEFAZOLIN SODIUM-DEXTROSE 2-4 GM/100ML-% IV SOLN: 2.0000 g | INTRAVENOUS | Status: DC

## 2018-06-01 MED ORDER — CYCLOBENZAPRINE HCL 5 MG PO TABS
5.0000 mg | ORAL_TABLET | Freq: Three times a day (TID) | ORAL | Status: DC | PRN
  Administered 2018-06-01: 10 mg via ORAL
  Filled 2018-06-01: qty 2

## 2018-06-01 MED ORDER — DEXAMETHASONE SODIUM PHOSPHATE 10 MG/ML IJ SOLN
INTRAMUSCULAR | Status: DC | PRN
  Administered 2018-06-01: 8 mg via INTRAVENOUS

## 2018-06-01 MED ORDER — CEFAZOLIN SODIUM 1 G IJ SOLR
INTRAMUSCULAR | Status: AC
  Filled 2018-06-01: qty 20

## 2018-06-01 MED ORDER — KCL IN DEXTROSE-NACL 20-5-0.45 MEQ/L-%-% IV SOLN: INTRAVENOUS | Status: DC

## 2018-06-01 MED ORDER — SODIUM CHLORIDE 0.9 % IV SOLN: 250.0000 mL | INTRAVENOUS | Status: DC

## 2018-06-01 MED ORDER — PHENYLEPHRINE HCL 10 MG/ML IJ SOLN
INTRAMUSCULAR | Status: DC | PRN
  Administered 2018-06-01 (×2): 120 ug via INTRAVENOUS
  Administered 2018-06-01: 80 ug via INTRAVENOUS
  Administered 2018-06-01 (×2): 120 ug via INTRAVENOUS

## 2018-06-01 MED ORDER — SODIUM CHLORIDE 0.9% FLUSH
3.0000 mL | Freq: Two times a day (BID) | INTRAVENOUS | Status: DC
  Administered 2018-06-01: 3 mL via INTRAVENOUS

## 2018-06-01 MED ORDER — HYDROXYZINE HCL 25 MG PO TABS: 50.0000 mg | ORAL_TABLET | ORAL | Status: DC | PRN

## 2018-06-01 MED ORDER — PHENOL 1.4 % MT LIQD: 1.0000 | OROMUCOSAL | Status: DC | PRN

## 2018-06-01 MED ORDER — BISACODYL 10 MG RE SUPP: 10.0000 mg | Freq: Every day | RECTAL | Status: DC | PRN

## 2018-06-01 MED ORDER — ACETAMINOPHEN 10 MG/ML IV SOLN
1000.0000 mg | INTRAVENOUS | Status: AC
  Administered 2018-06-01: 1000 mg via INTRAVENOUS
  Filled 2018-06-01: qty 100

## 2018-06-01 MED ORDER — ARTIFICIAL TEARS OPHTHALMIC OINT
TOPICAL_OINTMENT | OPHTHALMIC | Status: AC
  Filled 2018-06-01: qty 3.5

## 2018-06-01 MED ORDER — THROMBIN 5000 UNITS EX SOLR
CUTANEOUS | Status: AC
  Filled 2018-06-01: qty 5000

## 2018-06-01 MED ORDER — MIDAZOLAM HCL 5 MG/5ML IJ SOLN
INTRAMUSCULAR | Status: DC | PRN
  Administered 2018-06-01: 2 mg via INTRAVENOUS

## 2018-06-01 MED ORDER — MEPERIDINE HCL 50 MG/ML IJ SOLN: 6.2500 mg | INTRAMUSCULAR | Status: DC | PRN

## 2018-06-01 MED ORDER — SUGAMMADEX SODIUM 200 MG/2ML IV SOLN
INTRAVENOUS | Status: DC | PRN
  Administered 2018-06-01: 200 mg via INTRAVENOUS

## 2018-06-01 MED ORDER — 0.9 % SODIUM CHLORIDE (POUR BTL) OPTIME
TOPICAL | Status: DC | PRN
  Administered 2018-06-01: 1000 mL

## 2018-06-01 MED ORDER — LISINOPRIL 20 MG PO TABS: 20.0000 mg | ORAL_TABLET | Freq: Every day | ORAL | Status: DC

## 2018-06-01 MED ORDER — MORPHINE SULFATE (PF) 4 MG/ML IV SOLN
4.0000 mg | INTRAVENOUS | Status: DC | PRN
  Administered 2018-06-01: 4 mg via INTRAMUSCULAR
  Filled 2018-06-01: qty 1

## 2018-06-01 MED ORDER — CEFAZOLIN SODIUM-DEXTROSE 2-4 GM/100ML-% IV SOLN
INTRAVENOUS | Status: AC
  Filled 2018-06-01: qty 100

## 2018-06-01 MED ORDER — BUPIVACAINE HCL (PF) 0.5 % IJ SOLN
INTRAMUSCULAR | Status: AC
  Filled 2018-06-01: qty 30

## 2018-06-01 MED ORDER — ONDANSETRON HCL 4 MG/2ML IJ SOLN
INTRAMUSCULAR | Status: DC | PRN
  Administered 2018-06-01: 4 mg via INTRAVENOUS

## 2018-06-01 MED ORDER — LACTATED RINGERS IV SOLN
INTRAVENOUS | Status: DC | PRN
  Administered 2018-06-01: 07:00:00 via INTRAVENOUS

## 2018-06-01 MED ORDER — PROPOFOL 10 MG/ML IV BOLUS
INTRAVENOUS | Status: AC
  Filled 2018-06-01: qty 20

## 2018-06-01 MED ORDER — ONDANSETRON HCL 4 MG/2ML IJ SOLN: 4.0000 mg | Freq: Once | INTRAMUSCULAR | Status: DC | PRN

## 2018-06-01 MED ORDER — ROCURONIUM BROMIDE 50 MG/5ML IV SOSY
PREFILLED_SYRINGE | INTRAVENOUS | Status: DC | PRN
  Administered 2018-06-01: 25 mg via INTRAVENOUS

## 2018-06-01 MED ORDER — LACTATED RINGERS IV SOLN
INTRAVENOUS | Status: DC | PRN
  Administered 2018-06-01 (×2): via INTRAVENOUS

## 2018-06-01 MED ORDER — PROPOFOL 10 MG/ML IV BOLUS
INTRAVENOUS | Status: DC | PRN
  Administered 2018-06-01: 120 mg via INTRAVENOUS

## 2018-06-01 MED ORDER — HYDROMORPHONE HCL 1 MG/ML IJ SOLN
INTRAMUSCULAR | Status: AC
  Filled 2018-06-01: qty 1

## 2018-06-01 MED ORDER — ONDANSETRON HCL 4 MG/2ML IJ SOLN
INTRAMUSCULAR | Status: AC
  Filled 2018-06-01: qty 2

## 2018-06-01 MED ORDER — MIDAZOLAM HCL 2 MG/2ML IJ SOLN
INTRAMUSCULAR | Status: AC
  Filled 2018-06-01: qty 2

## 2018-06-01 MED ORDER — SURGIFOAM 100 EX MISC
CUTANEOUS | Status: DC | PRN
  Administered 2018-06-01: 09:00:00 via TOPICAL

## 2018-06-01 MED ORDER — ALUM & MAG HYDROXIDE-SIMETH 200-200-20 MG/5ML PO SUSP
30.0000 mL | Freq: Four times a day (QID) | ORAL | Status: DC | PRN
Start: 2018-06-01 — End: 2018-06-02

## 2018-06-01 MED ORDER — LISINOPRIL-HYDROCHLOROTHIAZIDE 20-25 MG PO TABS: 1.0000 | ORAL_TABLET | Freq: Every day | ORAL | Status: DC

## 2018-06-01 MED ORDER — ALBUTEROL SULFATE HFA 108 (90 BASE) MCG/ACT IN AERS
INHALATION_SPRAY | RESPIRATORY_TRACT | Status: DC | PRN
  Administered 2018-06-01: 6 via RESPIRATORY_TRACT
  Administered 2018-06-01: 4 via RESPIRATORY_TRACT

## 2018-06-01 MED ORDER — CEFAZOLIN SODIUM-DEXTROSE 2-4 GM/100ML-% IV SOLN
2.0000 g | INTRAVENOUS | Status: AC
  Administered 2018-06-01 (×2): 2 g via INTRAVENOUS

## 2018-06-01 MED ORDER — ARTIFICIAL TEARS OPHTHALMIC OINT
TOPICAL_OINTMENT | OPHTHALMIC | Status: DC | PRN
  Administered 2018-06-01: 1 via OPHTHALMIC

## 2018-06-01 MED ORDER — LORATADINE 10 MG PO TABS: 10.0000 mg | ORAL_TABLET | Freq: Every day | ORAL | Status: DC

## 2018-06-01 MED ORDER — SODIUM CHLORIDE 0.9 % IV SOLN
INTRAVENOUS | Status: DC | PRN
  Administered 2018-06-01: 100 ug/min via INTRAVENOUS

## 2018-06-01 MED ORDER — FENTANYL CITRATE (PF) 250 MCG/5ML IJ SOLN
INTRAMUSCULAR | Status: AC
  Filled 2018-06-01: qty 5

## 2018-06-01 MED ORDER — THROMBIN (RECOMBINANT) 20000 UNITS EX SOLR
CUTANEOUS | Status: AC
  Filled 2018-06-01: qty 20000

## 2018-06-01 MED ORDER — ROCURONIUM BROMIDE 50 MG/5ML IV SOSY
PREFILLED_SYRINGE | INTRAVENOUS | Status: AC
  Filled 2018-06-01: qty 5

## 2018-06-01 MED ORDER — UMECLIDINIUM-VILANTEROL 62.5-25 MCG/INH IN AEPB
1.0000 | INHALATION_SPRAY | Freq: Every day | RESPIRATORY_TRACT | Status: DC
  Filled 2018-06-01: qty 14

## 2018-06-01 MED ORDER — FLEET ENEMA 7-19 GM/118ML RE ENEM: 1.0000 | ENEMA | Freq: Once | RECTAL | Status: DC | PRN

## 2018-06-01 MED ORDER — THROMBIN 5000 UNITS EX SOLR
OROMUCOSAL | Status: DC | PRN
  Administered 2018-06-01: 10:00:00 via TOPICAL

## 2018-06-01 MED ORDER — DEXAMETHASONE SODIUM PHOSPHATE 10 MG/ML IJ SOLN
INTRAMUSCULAR | Status: AC
  Filled 2018-06-01: qty 1

## 2018-06-01 MED ORDER — EPHEDRINE SULFATE 50 MG/ML IJ SOLN
INTRAMUSCULAR | Status: DC | PRN
  Administered 2018-06-01 (×2): 5 mg via INTRAVENOUS

## 2018-06-01 MED ORDER — HYDROCHLOROTHIAZIDE 25 MG PO TABS: 25.0000 mg | ORAL_TABLET | Freq: Every day | ORAL | Status: DC

## 2018-06-01 MED ORDER — PHENYLEPHRINE 40 MCG/ML (10ML) SYRINGE FOR IV PUSH (FOR BLOOD PRESSURE SUPPORT)
PREFILLED_SYRINGE | INTRAVENOUS | Status: AC
  Filled 2018-06-01: qty 10

## 2018-06-01 MED ORDER — EPHEDRINE 5 MG/ML INJ
INTRAVENOUS | Status: AC
  Filled 2018-06-01: qty 10

## 2018-06-01 SURGICAL SUPPLY — 70 items
ADH SKN CLS APL DERMABOND .7 (GAUZE/BANDAGES/DRESSINGS) ×1
APL SKNCLS STERI-STRIP NONHPOA (GAUZE/BANDAGES/DRESSINGS) ×1
BAG DECANTER FOR FLEXI CONT (MISCELLANEOUS) ×2 IMPLANT
BENZOIN TINCTURE PRP APPL 2/3 (GAUZE/BANDAGES/DRESSINGS) ×2 IMPLANT
BLADE CLIPPER SURG (BLADE) ×1 IMPLANT
BUR ACRON 5.0MM COATED (BURR) ×2 IMPLANT
BUR MATCHSTICK NEURO 3.0 LAGG (BURR) ×2 IMPLANT
CAGE MOJAVE W-SCREW 11X24X9-12 (Cage) ×2 IMPLANT
CANISTER SUCT 3000ML PPV (MISCELLANEOUS) ×2 IMPLANT
CAP RELINE MOD TULIP RMM (Cap) ×2 IMPLANT
CARTRIDGE OIL MAESTRO DRILL (MISCELLANEOUS) ×1 IMPLANT
CLIP NEUROVISION LG (CLIP) ×1 IMPLANT
CONT SPEC 4OZ CLIKSEAL STRL BL (MISCELLANEOUS) ×2 IMPLANT
COVER BACK TABLE 60X90IN (DRAPES) ×2 IMPLANT
DECANTER SPIKE VIAL GLASS SM (MISCELLANEOUS) ×2 IMPLANT
DERMABOND ADVANCED (GAUZE/BANDAGES/DRESSINGS) ×1
DERMABOND ADVANCED .7 DNX12 (GAUZE/BANDAGES/DRESSINGS) ×1 IMPLANT
DIFFUSER DRILL AIR PNEUMATIC (MISCELLANEOUS) ×2 IMPLANT
DRAPE C-ARM 42X72 X-RAY (DRAPES) ×4 IMPLANT
DRAPE C-ARMOR (DRAPES) ×1 IMPLANT
DRAPE HALF SHEET 40X57 (DRAPES) ×1 IMPLANT
DRAPE LAPAROTOMY 100X72X124 (DRAPES) ×2 IMPLANT
DRAPE POUCH INSTRU U-SHP 10X18 (DRAPES) ×2 IMPLANT
ELECT REM PT RETURN 9FT ADLT (ELECTROSURGICAL) ×2
ELECTRODE REM PT RTRN 9FT ADLT (ELECTROSURGICAL) ×1 IMPLANT
GAUZE 4X4 16PLY RFD (DISPOSABLE) IMPLANT
GAUZE SPONGE 4X4 12PLY STRL (GAUZE/BANDAGES/DRESSINGS) ×2 IMPLANT
GLOVE BIOGEL PI IND STRL 7.5 (GLOVE) IMPLANT
GLOVE BIOGEL PI IND STRL 8 (GLOVE) ×2 IMPLANT
GLOVE BIOGEL PI INDICATOR 7.5 (GLOVE) ×2
GLOVE BIOGEL PI INDICATOR 8 (GLOVE) ×5
GLOVE ECLIPSE 7.5 STRL STRAW (GLOVE) ×7 IMPLANT
GOWN STRL REUS W/ TWL LRG LVL3 (GOWN DISPOSABLE) IMPLANT
GOWN STRL REUS W/ TWL XL LVL3 (GOWN DISPOSABLE) ×2 IMPLANT
GOWN STRL REUS W/TWL 2XL LVL3 (GOWN DISPOSABLE) ×2 IMPLANT
GOWN STRL REUS W/TWL LRG LVL3 (GOWN DISPOSABLE)
GOWN STRL REUS W/TWL XL LVL3 (GOWN DISPOSABLE) ×4
KIT BASIN OR (CUSTOM PROCEDURE TRAY) ×2 IMPLANT
KIT INFUSE XX SMALL 0.7CC (Orthopedic Implant) ×1 IMPLANT
KIT TURNOVER KIT B (KITS) ×2 IMPLANT
MODULE NVM5 NEXT GEN EMG (NEEDLE) ×1 IMPLANT
NDL SPNL 18GX3.5 QUINCKE PK (NEEDLE) ×1 IMPLANT
NDL SPNL 22GX3.5 QUINCKE BK (NEEDLE) ×1 IMPLANT
NEEDLE SPNL 18GX3.5 QUINCKE PK (NEEDLE) ×2 IMPLANT
NEEDLE SPNL 22GX3.5 QUINCKE BK (NEEDLE) ×2 IMPLANT
NS IRRIG 1000ML POUR BTL (IV SOLUTION) ×2 IMPLANT
OIL CARTRIDGE MAESTRO DRILL (MISCELLANEOUS) ×2
PACK LAMINECTOMY NEURO (CUSTOM PROCEDURE TRAY) ×2 IMPLANT
PAD ARMBOARD 7.5X6 YLW CONV (MISCELLANEOUS) ×6 IMPLANT
PATTIES SURGICAL .5 X.5 (GAUZE/BANDAGES/DRESSINGS) IMPLANT
PATTIES SURGICAL .5 X1 (DISPOSABLE) ×1 IMPLANT
PATTIES SURGICAL 1X1 (DISPOSABLE) IMPLANT
ROD RELINE COCR LORD 5X30 (Rod) ×2 IMPLANT
SCREW LOCK RSS 4.5/5.0MM (Screw) ×4 IMPLANT
SCREW RELINE RMM 5.5X35 4S (Screw) ×2 IMPLANT
SCREW SHANK RELINE MOD 5.5X30 (Screw) ×2 IMPLANT
SPONGE LAP 4X18 RFD (DISPOSABLE) IMPLANT
SPONGE NEURO XRAY DETECT 1X3 (DISPOSABLE) IMPLANT
SPONGE SURGIFOAM ABS GEL 100 (HEMOSTASIS) ×2 IMPLANT
STRIP BIOACTIVE VITOSS 25X100X (Neuro Prosthesis/Implant) ×1 IMPLANT
SUT VIC AB 1 CT1 18XBRD ANBCTR (SUTURE) ×2 IMPLANT
SUT VIC AB 1 CT1 8-18 (SUTURE) ×4
SUT VIC AB 2-0 CP2 18 (SUTURE) ×4 IMPLANT
SYR 3ML LL SCALE MARK (SYRINGE) IMPLANT
SYR CONTROL 10ML LL (SYRINGE) ×2 IMPLANT
TAPE CLOTH SURG 4X10 WHT LF (GAUZE/BANDAGES/DRESSINGS) ×1 IMPLANT
TOWEL GREEN STERILE (TOWEL DISPOSABLE) ×2 IMPLANT
TOWEL GREEN STERILE FF (TOWEL DISPOSABLE) ×2 IMPLANT
TRAY FOLEY MTR SLVR 16FR STAT (SET/KITS/TRAYS/PACK) ×2 IMPLANT
WATER STERILE IRR 1000ML POUR (IV SOLUTION) ×2 IMPLANT

## 2018-06-01 NOTE — Anesthesia Procedure Notes (Signed)
Procedure Name: Intubation Date/Time: 06/01/2018 7:40 AM Performed by: Inda Coke, CRNA Pre-anesthesia Checklist: Patient identified, Emergency Drugs available, Suction available and Patient being monitored Patient Re-evaluated:Patient Re-evaluated prior to induction Oxygen Delivery Method: Circle System Utilized Preoxygenation: Pre-oxygenation with 100% oxygen Induction Type: IV induction Ventilation: Mask ventilation without difficulty and Oral airway inserted - appropriate to patient size Laryngoscope Size: Mac and 4 Grade View: Grade II Tube type: Oral Tube size: 7.5 mm Number of attempts: 1 Airway Equipment and Method: Stylet and Oral airway Placement Confirmation: ETT inserted through vocal cords under direct vision,  positive ETCO2 and breath sounds checked- equal and bilateral Secured at: 22 cm Tube secured with: Tape Dental Injury: Teeth and Oropharynx as per pre-operative assessment

## 2018-06-01 NOTE — Anesthesia Postprocedure Evaluation (Signed)
Anesthesia Post Note  Patient: Mark Cannon  Procedure(s) Performed: LUMBAR FOUR- LUMBAR FIVE DECOMPRESSION, POSTERIOR LUMBAR INTERBODY FUSION AND POSTERIOR LATERAL ARTHRODESIS (N/A Spine Lumbar)     Patient location during evaluation: PACU Anesthesia Type: General Level of consciousness: awake and alert Pain management: pain level controlled Vital Signs Assessment: post-procedure vital signs reviewed and stable Respiratory status: spontaneous breathing, nonlabored ventilation, respiratory function stable and patient connected to nasal cannula oxygen Cardiovascular status: blood pressure returned to baseline and stable Postop Assessment: no apparent nausea or vomiting Anesthetic complications: no    Last Vitals:  Vitals:   06/01/18 1435 06/01/18 1450  BP: (!) 152/62 (!) 156/63  Pulse: (!) 101 98  Resp: 17 14  Temp:    SpO2: 98% 97%    Last Pain:  Vitals:   06/01/18 1418  TempSrc:   PainSc: Asleep                 Emigdio Wildeman DAVID

## 2018-06-01 NOTE — Progress Notes (Signed)
Vitals:   06/01/18 1450 06/01/18 1530 06/01/18 1545 06/01/18 1609  BP: (!) 156/63 140/83 (!) 142/74 (!) 163/88  Pulse: 98 93 91 (!) 101  Resp: 14 (!) 34 (!) 21 18  Temp:   (!) 97.5 F (36.4 C) 98.6 F (37 C)  TempSrc:    Oral  SpO2: 97% 99% 93% 97%  Weight:      Height:        CBC Recent Labs    06/01/18 0625  WBC 8.0  HGB 15.4  HCT 47.7  PLT 236   BMET Recent Labs    06/01/18 0625  NA 141  K 3.9  CL 104  CO2 27  GLUCOSE 162*  BUN 18  CREATININE 1.36*  CALCIUM 9.0    Patient resting in bed comfortably, has not yet ambulated in the halls.  Foley to straight drainage, patient is concerned about discontinuing Foley because of history of previous prostate surgery.  Dressing clean and dry.  Moving all 4 extremities well.  Plan: Spoke with patient, his wife, and his son.  I emphasized the importance of progressing to ambulation, ideally several times this evening, and more tomorrow.  We also discussed the need for walking on a regular basis when he is home.  As regards the Foley, we will leave it into the morning, and then discontinue it.  Hosie Spangle, MD 06/01/2018, 5:58 PM

## 2018-06-01 NOTE — Transfer of Care (Signed)
Immediate Anesthesia Transfer of Care Note  Patient: Mark Cannon  Procedure(s) Performed: LUMBAR FOUR- LUMBAR FIVE DECOMPRESSION, POSTERIOR LUMBAR INTERBODY FUSION AND POSTERIOR LATERAL ARTHRODESIS (N/A Spine Lumbar)  Patient Location: PACU  Anesthesia Type:General  Level of Consciousness: awake and drowsy  Airway & Oxygen Therapy: Patient Spontanous Breathing and Patient connected to face mask oxygen  Post-op Assessment: Report given to RN and Post -op Vital signs reviewed and stable  Post vital signs: Reviewed and stable  Last Vitals:  Vitals Value Taken Time  BP 113/52 06/01/2018 12:50 PM  Temp 36.6 C 06/01/2018 12:50 PM  Pulse 92 06/01/2018 12:54 PM  Resp 22 06/01/2018 12:54 PM  SpO2 100 % 06/01/2018 12:54 PM  Vitals shown include unvalidated device data.  Last Pain:  Vitals:   06/01/18 1250  TempSrc:   PainSc: Asleep         Complications: No apparent anesthesia complications

## 2018-06-01 NOTE — Op Note (Signed)
06/01/2018  12:29 PM  PATIENT:  Mark Cannon  67 y.o. male  PRE-OPERATIVE DIAGNOSIS: L4-5 multifactorial lumbar stenosis with neurogenic claudication, lumbar spondylosis, lumbar degenerative disease  POST-OPERATIVE DIAGNOSIS:  L4-5 multifactorial lumbar stenosis with neurogenic claudication, lumbar spondylosis, lumbar degenerative disease  PROCEDURE:  Procedure(s): Bilateral L4-5 lumbar decompression including laminectomy, facetectomy, and foraminotomies for the exiting L4 and L5 nerve roots bilaterally, with decompression beyond that required for interbody arthrodesis; bilateral L4-5 posterior lumbar interbody arthrodesis with Mojave expandable interbody implants ([employer]/K2M), Vitoss BA, and infuse; bilateral L4-5 posterior lateral arthrodesis with nonsegmental posterior instrumentation (reline MAS Midline screws -Nuvasive), Vitoss BA, and infuse  SURGEON: [name], MD  ASSISTANTS: Kary Kos, MD  ANESTHESIA:   general  EBL:  Total I/O In: 2774 [I.V.:2000; Blood:85; IV JOINOMVEH:209] Out: 47 [Urine:90; Blood:400]  BLOOD ADMINISTERED:85 CC CELLSAVER  COUNT:  Correct per nursing staff  DICTATION: Patient is brought to the operating room placed under general endotracheal anesthesia. The patient was turned to prone position the lumbar region was prepped with Betadine soap and solution and draped in a sterile fashion. The midline was infiltrated with local anesthesia with epinephrine. A localizing x-ray was taken and then a midline incision was made carried down through the subcutaneous tissue, bipolar cautery and electrocautery were used to maintain hemostasis. Dissection was carried down to the lumbar fascia. The fascia was incised bilaterally and the paraspinal muscles were dissected with a spinous process and lamina in a subperiosteal fashion. Another x-ray was taken for localization and the L4-5 level was localized. Dissection was then carried out laterally over the facet complex.   Self-retaining retractor was placed.  The C-arm fluoroscope was then draped and brought in the field.  We identified pedicle entry points for bilateral L4 cortical shanks.  Pilot holes were made with the high-speed drill and then with electrophysiologic monitoring bilateral cortical screw holes were drilled to a depth of 35 mm.  There were examined with a ball probe, and good bony surfaces were found.  They were tapped, and then we placed 5.5 x 30 mm shanks bilaterally.  We then proceeded with the decompression.  Bilateral laminectomy was performed using the high-speed drill and Kerrison punches. Dissection was carried out laterally including facetectomy and foraminotomies with decompression of the stenotic compression of the exiting L4 and L5 nerve roots bilaterally.  Thickened ligamentum flavum was carefully removed.  Once the decompression stenotic compression of the thecal sac and exiting nerve roots was completed we proceeded with the posterior lumbar interbody arthrodesis. The annulus was incised bilaterally and the disc space entered. A thorough discectomy was performed using pituitary rongeurs and curettes. Once the discectomy was completed we began to prepare the endplate surfaces removing the cartilaginous endplates surface. We then measured the height of the intervertebral disc space. We selected 11 x 24 x 9-12 expandable Mojave interbody implants.  They were placed with C-arm fluoroscopic guidance.  The first implant was placed in the right side, we first expanded the implant anteriorly fully, and then expanded it posteriorly.  The locking cap was placed and secured.  We were then went back to the left side and packed the midline with Vitoss BA and infuse.  The second implant was placed on the left side, and again first expanded anteriorly fully and then expanded posteriorly.  The locking cap was placed and secured.  We then placed bilateral L5 pedicle screws with C-arm fluoroscopic guidance.   Entry points were identified, and each screw hole was drilled.  It was examined with a ball probe, and good bony surfaces were found.  They were tapped, and then we placed 5.5 x 35 mm screws bilaterally.  We then secured screw heads to the shanks, and selected a 35 mm pre-lordosed rod for the left side and a 30 mm pre-lordosed rod for the right side.  There were placed within the screw heads, and secured with locking caps.  Once all 4 locking caps were in place they were final tightened against a counter torque.  We then packed infuse and Vitoss BA into the decorticated L4-5 facet (against the decorticated articular processes).  The wound had been irrigated multiple times during the procedure with saline solution and bacitracin solution, good hemostasis was established with a combination of bipolar cautery and Gelfoam with thrombin, a thin layer of Surgifoam was applied. Once good hemostasis was confirmed we proceeded with closure paraspinal muscles deep fascia and Scarpa's fascia were closed with interrupted undyed 1 Vicryl sutures the subcutaneous and subcuticular closed with interrupted inverted 2-0 undyed Vicryl sutures the skin edges were approximated with Dermabond.  Wound was dressed with sterile gauze and Hypafix.  Following surgery the patient was turned back to the supine position to be reversed and the anesthetic extubated and transferred to the recovery room for further care.   PLAN OF CARE: Admit to inpatient   PATIENT DISPOSITION:  PACU - hemodynamically stable.   Delay start of Pharmacological VTE agent (>24hrs) due to surgical blood loss or risk of bleeding:  yes

## 2018-06-01 NOTE — H&P (Signed)
Subjective: Patient is a 67 y.o. right-handed white male who is admitted for treatment of severe L4-5 multifactorial lumbar stenosis with neurogenic claudication.  He had difficulty with low back pain, radiating down into the lower extremities bilaterally, past 20 years.  It is been gradually worsening.  He has been treated with NSAIDs for many years.  He is become increasingly limited in the duration of standing and walking.  His symptoms are quickly relieved by sitting.  He is admitted now for a L4-5 lumbar decompression and stabilization including L4-5 laminectomy, facetectomy, and foraminotomy, bilateral L4-5 posterior lumbar interbody arthrodesis with interbody implants and bone graft, and bilateral L4-5 posterior lateral arthrodesis with posterior instrumentation and bone graft.  Patient does have COPD, followed by his pulmonologist Dr. Velvet Bathe, who is cleared him for surgery, but acknowledges that he is higher than average risk.   Patient Active Problem List   Diagnosis Date Noted  . Prostate cancer ([place]) 03/22/2015   Past Medical History:  Diagnosis Date  . Arthritis   . Cancer [facility])    Prostate, has had prostate removed, has had radiation and still has cancer,  . Colon polyps   . Cough   . GERD (gastroesophageal reflux disease)    occasional   . H/O tooth extraction week of 03/15/2015   . History of kidney stones   . Hypercholesteremia   . Hypertension   . Lumbar stenosis with neurogenic claudication   . Pneumonia   . Pre-diabetes   . Renal disorder   . Sleep apnea    No cpap    Past Surgical History:  Procedure Laterality Date  . APPENDECTOMY    . BACK SURGERY    . CHOLECYSTECTOMY    . COLONOSCOPY  02/04/2012   Procedure: COLONOSCOPY;  Surgeon: Jamesetta So, MD;  Location: [facility];  Service: Gastroenterology;  Laterality: N/A;  . COLONOSCOPY W/ POLYPECTOMY    . HERNIA REPAIR     Bilateral inguinal hernia repair X 4  . hydrocelectomy    . left knee  arthroscopy    . LITHOTRIPSY    . LYMPHADENECTOMY Bilateral 03/22/2015   Procedure: PELVIC LYMPHADENECTOMY;  Surgeon: Alexis Frock, MD;  Location: [facility] ORS;  Service: Urology;  Laterality: Bilateral;  . NM MYOVIEW LTD  2011  . ROBOT ASSISTED LAPAROSCOPIC RADICAL PROSTATECTOMY N/A 03/22/2015   Procedure: ROBOTIC ASSISTED LAPAROSCOPIC RADICAL PROSTATECTOMY WITH INDOCYANINE GREEN DYE INJECTION;  Surgeon: Alexis Frock, MD;  Location: [facility] ORS;  Service: Urology;  Laterality: N/A;  . ROBOTIC ASSISTED LAPAROSCOPIC LYSIS OF ADHESION  03/22/2015   Procedure: ROBOTIC ASSISTED LAPAROSCOPIC LYSIS OF ADHESION;  Surgeon: Alexis Frock, MD;  Location: [facility] ORS;  Service: Urology;;  . [name]      Medications Prior to Admission  Medication Sig Dispense Refill Last Dose  . cetirizine (ZYRTEC) 10 MG tablet Take 10 mg by mouth daily.   06/01/2018 at Unknown time  . ciprofloxacin (CILOXAN) 0.3 % ophthalmic ointment Place 1 drop into both eyes 3 (three) times daily. Instructed to use 2-5 times daily for 7 days   Past Week at Unknown time  . lisinopril-hydrochlorothiazide (PRINZIDE,ZESTORETIC) 20-25 MG per tablet Take 1 tablet by mouth daily.   05/31/2018 at Unknown time  . meloxicam (MOBIC) 15 MG tablet Take 15 mg by mouth daily.     . Misc Natural Products (T-RELIEF CBD+13 SL) Place 10 drops under the tongue 2 (two) times daily.   Past Week at Unknown time  . ranitidine (ZANTAC) 150 MG tablet  Take 150 mg by mouth daily as needed for heartburn.    05/31/2018 at Unknown time  . traMADol (ULTRAM) 50 MG tablet Take 50 mg by mouth every 6 (six) hours as needed.   Past Week at Unknown time  . umeclidinium-vilanterol (ANORO ELLIPTA) 62.5-25 MCG/INH AEPB Inhale 1 puff into the lungs daily.   06/01/2018 at Unknown time  . doxycycline (DORYX) 100 MG EC tablet Take 100 mg by mouth 2 (two) times daily.      Allergies  Allergen Reactions  . Lidocaine Hives  . Benzocaine Other (See Comments)    Blisters     Social History    Tobacco Use  . Smoking status: Current Every Day Smoker    Packs/day: 1.00    Years: 50.00    Pack years: 50.00    Types: Cigarettes  . Smokeless tobacco: Never Used  Substance Use Topics  . Alcohol use: Yes    Alcohol/week: 1.0 standard drinks    Types: 1 Glasses of wine per week    Comment: occasional    Family History  Problem Relation Age of Onset  . Heart failure Father   . Stroke Father   . Colon cancer Neg Hx      Review of Systems Pertinent items noted in HPI and remainder of comprehensive ROS otherwise negative.  Objective: Vital signs in last 24 hours: Temp:  [98.2 F (36.8 C)] 98.2 F (36.8 C) (08/19 0604) Pulse Rate:  [93] 93 (08/19 0604) Resp:  [20] 20 (08/19 0604) BP: (148)/(72) 148/72 (08/19 0604) Weight:  [106.6 kg] 106.6 kg (08/19 0606)  EXAM: Patient is an obese white male in no acute distress. Lungs are clear to auscultation , the patient has symmetrical respiratory excursion, breath sounds are distant. Heart has a regular rate and rhythm normal S1 and S2 no murmur, heart sounds are distant.   Abdomen is soft nontender nondistended bowel sounds are present. Extremity examination shows no clubbing cyanosis or edema. Motor examination shows 5 over 5 strength in the lower extremities including the iliopsoas quadriceps dorsiflexor extensor hallicus  longus and plantar flexor bilaterally. Sensation is intact to pinprick in the distal lower extremities. Reflexes are symmetrical bilaterally. No pathologic reflexes are present. Patient has a normal gait and stance.  Data Review:CBC    Component Value Date/Time   WBC 8.0 06/01/2018 0625   RBC 5.00 06/01/2018 0625   HGB 15.4 06/01/2018 0625   HCT 47.7 06/01/2018 0625   PLT 236 06/01/2018 0625   MCV 95.4 06/01/2018 0625   MCH 30.8 06/01/2018 0625   MCHC 32.3 06/01/2018 0625   RDW 13.5 06/01/2018 0625   LYMPHSABS 1.1 12/01/2016 0913   MONOABS 0.5 12/01/2016 0913   EOSABS 0.3 12/01/2016 0913   BASOSABS  0.0 12/01/2016 0913                          BMET    Component Value Date/Time   NA 139 04/27/2018 1021   K 4.2 04/27/2018 1021   CL 100 04/27/2018 1021   CO2 28 04/27/2018 1021   GLUCOSE 126 (H) 04/27/2018 1021   BUN 27 (H) 04/27/2018 1021   CREATININE 1.40 (H) 04/27/2018 1021   CALCIUM 9.0 04/27/2018 1021   GFRNONAA 50 (L) 04/27/2018 1021   GFRAA 59 (L) 04/27/2018 1021     Assessment/Plan: Patient with lumbar stenosis with neurogenic claudication who is admitted for lumbar decompression and stabilization.  I've discussed with the patient the  nature of his condition, the nature the surgical procedure, the typical length of surgery, hospital stay, and overall recuperation, the limitations postoperatively, and risks of surgery. I discussed risks including risks of infection, bleeding, possibly need for transfusion, the risk of nerve root dysfunction with pain, weakness, numbness, or paresthesias, the risk of dural tear and CSF leakage and possible need for further surgery, the risk of failure of the arthrodesis and possibly for further surgery, the risk of anesthetic complications including myocardial infarction, stroke, pneumonia, and death. We discussed the need for postoperative immobilization in a lumbar brace. Understanding all this the patient does wish to proceed with surgery and is admitted for such.    Hosie Spangle, MD 06/01/2018 7:11 AM

## 2018-06-01 NOTE — Anesthesia Preprocedure Evaluation (Signed)
Anesthesia Evaluation  Patient identified by MRN, date of birth, ID band Patient awake    Reviewed: Allergy & Precautions, NPO status , Patient's Chart, lab work & pertinent test results  Airway Mallampati: I  TM Distance: >3 FB Neck ROM: Full    Dental   Pulmonary sleep apnea , COPD, Current Smoker,    Pulmonary exam normal        Cardiovascular hypertension, Pt. on medications Normal cardiovascular exam     Neuro/Psych    GI/Hepatic GERD  Medicated and Controlled,  Endo/Other    Renal/GU      Musculoskeletal   Abdominal   Peds  Hematology   Anesthesia Other Findings   Reproductive/Obstetrics                             Anesthesia Physical Anesthesia Plan  ASA: III  Anesthesia Plan: General   Post-op Pain Management:    Induction: Intravenous  PONV Risk Score and Plan: 1 and Ondansetron  Airway Management Planned: Oral ETT  Additional Equipment:   Intra-op Plan:   Post-operative Plan: Extubation in OR  Informed Consent: I have reviewed the patients History and Physical, chart, labs and discussed the procedure including the risks, benefits and alternatives for the proposed anesthesia with the patient or authorized representative who has indicated his/her understanding and acceptance.     Plan Discussed with: CRNA and Surgeon  Anesthesia Plan Comments:         Anesthesia Quick Evaluation

## 2018-06-02 ENCOUNTER — Telehealth

## 2018-06-02 ENCOUNTER — Ambulatory Visit

## 2018-06-02 DIAGNOSIS — J45909 Unspecified asthma, uncomplicated: Secondary | ICD-10-CM

## 2018-06-02 MED ORDER — VENTOLIN HFA 108 (90 BASE) MCG/ACT IN AERS
11 refills | Status: AC
Start: 2018-06-02 — End: ?

## 2018-06-02 MED ORDER — HYDROCODONE-ACETAMINOPHEN 5-325 MG PO TABS: 1.0000 | ORAL_TABLET | ORAL | 0 refills | Status: DC | PRN

## 2018-06-02 MED FILL — Thrombin (Recombinant) For Soln 20000 Unit: CUTANEOUS | Qty: 1 | Status: AC

## 2018-06-02 NOTE — Discharge Summary (Signed)
Physician Discharge Summary  Patient ID: Mark Cannon MRN: [id number] DOB/AGE: 1950-11-17 67 y.o.  Admit date: 06/01/2018 Discharge date: 06/02/2018  Admission Diagnoses:  L4-5 multifactorial lumbar stenosis with neurogenic claudication, lumbar spondylosis, lumbar degenerative disease  Discharge Diagnoses:  L4-5 multifactorial lumbar stenosis with neurogenic claudication, lumbar spondylosis, lumbar degenerative disease Active Problems:   Lumbar stenosis with neurogenic claudication   Discharged Condition: good  Hospital Course: Patient was admitted, underwent an L4-5 lumbar decompression, PLIF, and PLA.  Postoperatively he is done well.  He is up and ambulating actively in the halls.  His incision is healing nicely.  He is voiding well.  He is asking to be discharged home.  He has been given instructions regarding wound care and activities following discharge.  He is scheduled for follow-up with me in 3 weeks.  Discharge Exam: Blood pressure 132/79, pulse 90, temperature 98.6 F (37 C), temperature source Oral, resp. rate 16, height 5\' 8"  (1.727 m), weight 106.6 kg, SpO2 96 %.  Disposition: Discharge disposition: 01-Home or Self Care       Discharge Instructions    Discharge wound care:   Complete by:  As directed    Leave the wound open to air. Shower daily with the wound uncovered. Water and soapy water should run over the incision area. Do not wash directly on the incision for 2 weeks. Remove the glue after 2 weeks.   Driving Restrictions   Complete by:  As directed    No driving for 2 weeks. May ride in the car locally now. May begin to drive locally in 2 weeks.   Other Restrictions   Complete by:  As directed    Walk gradually increasing distances out in the fresh air at least twice a day. Walking additional 6 times inside the house, gradually increasing distances, daily. No bending, lifting, or twisting. Perform activities between shoulder and waist height (that is at  counter height when standing or table height when sitting).     Allergies as of 06/02/2018      Reactions   Lidocaine Hives   Benzocaine Other (See Comments)   Blisters       Medication List    TAKE these medications   ANORO ELLIPTA 62.5-25 MCG/INH Aepb Generic drug:  umeclidinium-vilanterol Inhale 1 puff into the lungs daily.   cetirizine 10 MG tablet Commonly known as:  ZYRTEC Take 10 mg by mouth daily.   ciprofloxacin 0.3 % ophthalmic ointment Commonly known as:  CILOXAN Place 1 drop into both eyes 3 (three) times daily. Instructed to use 2-5 times daily for 7 days   doxycycline 100 MG EC tablet Commonly known as:  DORYX Take 100 mg by mouth 2 (two) times daily.   HYDROcodone-acetaminophen 5-325 MG tablet Commonly known as:  NORCO/VICODIN Take 1-2 tablets by mouth every 4 (four) hours as needed (pain).   lisinopril-hydrochlorothiazide 20-25 MG tablet Commonly known as:  PRINZIDE,ZESTORETIC Take 1 tablet by mouth daily.   meloxicam 15 MG tablet Commonly known as:  MOBIC Take 15 mg by mouth daily.   ranitidine 150 MG tablet Commonly known as:  ZANTAC Take 150 mg by mouth daily as needed for heartburn.   T-RELIEF CBD+13 SL Place 10 drops under the tongue 2 (two) times daily.   traMADol 50 MG tablet Commonly known as:  ULTRAM Take 50 mg by mouth every 6 (six) hours as needed.            Discharge Care Instructions  (From admission, onward)  Start     Ordered   06/02/18 0000  Discharge wound care:    Comments:  Leave the wound open to air. Shower daily with the wound uncovered. Water and soapy water should run over the incision area. Do not wash directly on the incision for 2 weeks. Remove the glue after 2 weeks.   06/02/18 1129           Signed: Hosie Spangle 06/02/2018, 11:29 AM

## 2018-06-02 NOTE — Discharge Instructions (Signed)
°  Return to Work Will be discussed at you follow up appointment. Call Your Doctor If Any of These Occur Redness, drainage, or swelling at the wound.  Temperature greater than 101 degrees. Severe pain not relieved by pain medication. Incision starts to come apart. Follow Up Appt Call today for appointment in 3 weeks ([phone]) or for problems.  If you have any hardware placed in your spine, you will need an x-ray before your appointment. 

## 2018-06-02 NOTE — Progress Notes (Signed)
Patient alert and oriented, mae's well, voiding adequate amount of urine, swallowing without difficulty, no c/o pain at time of discharge. Patient discharged home with family. Script and discharged instructions given to patient. Patient and family stated understanding of instructions given. Patient has an appointment with Dr. Nudelman 

## 2018-06-02 NOTE — Telephone Encounter
Called patient and LVM advising that Dr. Evelena LeydenZeidler has recommended patient do a breathing test and get blood work done prior to follow up visit.     I advised patient to call PFT lab at #[phone] and then call us back to schedule follow up with Dr. Evelena LeydenZeidler.

## 2018-06-03 MED FILL — Heparin Sodium (Porcine) Inj 1000 Unit/ML: INTRAMUSCULAR | Qty: 30 | Status: AC

## 2018-06-03 MED FILL — Sodium Chloride IV Soln 0.9%: INTRAVENOUS | Qty: 2000 | Status: AC

## 2018-06-16 MED ORDER — MONTELUKAST SODIUM 10 MG PO TABS
ORAL_TABLET | 3 refills | Status: AC
Start: 2018-06-16 — End: 2019-06-13

## 2018-06-22 DIAGNOSIS — Z981 Arthrodesis status: Secondary | ICD-10-CM | POA: Diagnosis not present

## 2018-06-22 DIAGNOSIS — M48062 Spinal stenosis, lumbar region with neurogenic claudication: Secondary | ICD-10-CM | POA: Diagnosis not present

## 2018-06-22 DIAGNOSIS — I10 Essential (primary) hypertension: Secondary | ICD-10-CM | POA: Diagnosis not present

## 2018-06-22 DIAGNOSIS — M47816 Spondylosis without myelopathy or radiculopathy, lumbar region: Secondary | ICD-10-CM | POA: Diagnosis not present

## 2018-06-22 DIAGNOSIS — Z6837 Body mass index (BMI) 37.0-37.9, adult: Secondary | ICD-10-CM | POA: Diagnosis not present

## 2018-06-22 DIAGNOSIS — M5136 Other intervertebral disc degeneration, lumbar region: Secondary | ICD-10-CM | POA: Diagnosis not present

## 2018-07-09 DIAGNOSIS — Z6837 Body mass index (BMI) 37.0-37.9, adult: Secondary | ICD-10-CM | POA: Diagnosis not present

## 2018-07-09 DIAGNOSIS — K602 Anal fissure, unspecified: Secondary | ICD-10-CM | POA: Diagnosis not present

## 2018-07-09 DIAGNOSIS — K625 Hemorrhage of anus and rectum: Secondary | ICD-10-CM | POA: Diagnosis not present

## 2018-07-14 ENCOUNTER — Inpatient Hospital Stay: Payer: BLUE CROSS/BLUE SHIELD

## 2018-07-14 ENCOUNTER — Ambulatory Visit: Payer: BLUE CROSS/BLUE SHIELD

## 2018-07-14 DIAGNOSIS — G4733 Obstructive sleep apnea (adult) (pediatric): Secondary | ICD-10-CM

## 2018-07-14 DIAGNOSIS — J4540 Moderate persistent asthma, uncomplicated: Secondary | ICD-10-CM

## 2018-07-14 DIAGNOSIS — J45909 Unspecified asthma, uncomplicated: Secondary | ICD-10-CM

## 2018-07-14 DIAGNOSIS — Z23 Encounter for immunization: Secondary | ICD-10-CM

## 2018-07-14 NOTE — Progress Notes
I saw and evaluated  Maurice Ramirez.  I discussed the case with the fellow and agree with the findings and plan of care as documented in the resident's note.       Patric Buckhalter R. Bridgett Hattabaugh

## 2018-07-14 NOTE — Patient Instructions
1- We will see you in 12 months.   2- Scheduled your lung function testing and stop by lab prior to your appointment next year.   3- Bring your chip from your sleep apnea device to every visit

## 2018-07-14 NOTE — Progress Notes
PULMONARY SLEEP PROGRESS NOTE    REFERRING PRACTITIONER: Lazaro Arms., MD  PRIMARY CARE PROVIDER: Babs Sciara, MD  CHIEF COMPLAINT: follow up on OSA and asthma     History of Presenting Illness:  Maurice Ramirez is a 67 y.o. y.o. male with  moderate persistent asthma, intermittent episodes of allergic bronchopulmonary aspergillosis , h/o LLL nodule (bx in 2012 benign), OSA on PAP, and h/o afib s/p cardioversion, who is here for follow up. He was last seen in 2017    Overall he is doing great. His FEV1 is unchanged since 2017 (48.7%). Every morning he participates in a guided meditation followed by other methods of meditation. He also runs.jogs 40-75 min after work on most days. He denies any recent exacerbation of his asthma including nocturnal cough or wheezing. He has not had a chance to go to lab today for his blood work.     He does not has his PAP chip with him today. According to the last note in 08/2016 he is on APAP 7-12. He uses a full face mask. His mask often comes off of his face. He is curious about SoClean for cleaning today. He denies sleepiness with driving.  His adherence data is unknown but he reports daily use.       Asthma meds and hx:  Singulair 10mg  daily, advair 500/50 and albuterol (1x/3-4 days)  Last hospital visit for asthma was several years ago, intubated about 44yrs ago for a severe attack  Last use of prednisone dose for asthma (prior to sinus infections) was several years ago      EPWORTH SLEEPINESS SCALE:  [1]Sitting and reading    [2 ]Watching TV  [1 ]Sitting inactive in a public place (e.g., a theater or a meeting)   [3 ]As a passenger in a car for an hour without a break   [0]Lying down to rest in the afternoon when circumstances permit  [1]Sitting and talking to someone   [0 ]Sitting quietly after a lunch without alcohol    [0]In a car, while stopped for a few minutes in traffic     TOTAL 8    COMPLIANCE DATA: No new data today Data Range: 02/05/2016 to 05/04/2016  Days with Device Usage: 86 days  Days without Device Usage: 4 days   % Days with Device Usage: 95.6 %   Average Usage (days used): 4 Hrs 49 Mins 37 Secs  % Days with Usage > = 4 hours: 56.7 %   AHI (Apnea Hypopnea Index): 3.4  Auto CPAP Mean Pressure: 7.5 cmH2O  Auto CPAP Peak Average Pressure:  9.9 cmH2O  Average Device Pressure <= 90% of Time:  9.1 cmH2O  Average Time in Large Leak: 0 Hrs 5 Mins 26 Secs     REVIEW OF SYSTEMS: A 10 point ROS was filled out by the patient and is available in the chart. It was reviewed by me.     Past Medical History:   Asthma, moderate persistent  ABPA, intermittent  h/o LLL nodule (bx in 2012 benign),   OSA on PAP  h/o afib s/p cardioversion    Social History:  reports that he has never smoked. He has never used smokeless tobacco.    Family History family history is not on file.    Allergies:   Allergies   Allergen Reactions   ??? Chocolate    ??? Pollen Extract    ??? Wheat Bran        Outpatient Meds:  Current Outpatient Prescriptions   Medication Sig   ??? ADVAIR DISKUS 500-50 MCG/DOSE diskus USE 1 INHALATION TWICE A DAY   ??? MONTELUKAST 10 mg tablet TAKE 1 TABLET EVERY EVENING   ??? VENTOLIN HFA 108 (90 Base) MCG/ACT inhaler USE 2 INHALATIONS EVERY 6 HOURS AS NEEDED     No current facility-administered medications for this visit.        Physical Examination:   BP 120/51  ~ Pulse 57  ~ Temp 36.7 ???C (98.1 ???F) (Oral)  ~ Resp 16  ~ Ht 5' 7.8'' (1.722 m)  ~ Wt 168 lb 6.4 oz (76.4 kg)  ~ SpO2 98%  ~ BMI 25.76 kg/m???   ??? General: Well appearing in no apparent distress.  ??? Psychiatric: Alert and oriented to person, time and place. Appropriate affect.  ??? Eyes: Anicteric, no conjunctival pallor. Pupils equal in size and symmetrical.  ??? Neck: Symmetric in appearance. Trachea midline.  ??? Respiratory: Clear to auscultation bilaterally. Normal resonance to percussion. No accessory muscle usage.  ??? Cardiovascular: Regular, no murmurs rubs or gallops. Warm extremities with no pitting edema.  ??? Abdomen: Soft, lax and non-tender with no organomegaly appreciated.  ??? Musculoskeletal: No clubbing or cyanosis. Gait is normal in appearance.  ??? Skin: No significant skin abnormalities evident on easily exposed surfaces.  ??? Mallampati: 1    LABORATORY STUDIES:  Lab Results   Component Value Date    WBC 6.53 09/03/2016    HGB 14.1 09/03/2016    HCT 41.9 09/03/2016    MCV 93.9 09/03/2016    PLT 232 09/03/2016     Lab Results   Component Value Date    NA 140 06/04/2011    K 4.5 06/04/2011    CL 104 06/04/2011    CO2 26 06/04/2011    BUN 24 (@) 06/04/2011    CREAT 0.9 06/04/2011     Lab Results   Component Value Date    ALT 19 06/04/2011    AST 16 06/04/2011    ALKPHOS 71 06/04/2011    BILITOT 0.6 06/04/2011     Labs:   08/2016  IgE 1,422  Asp IgE<0.10  CBC WBC 6.53/ Hgb 14.1/ Plt 232/ Eosinophil 8.3%;    09/2014  Ige 1124  Cbc W/ 6.4% EO's  Asp EIA 0.71    PSG : none on file    Imaging:  CT chest 2012  Unchanged biapical fibronodular pleural parenchymal scarring and calcified subcentimeter nodule within the right middle lobe (2-51),   consistent with prior granulomatous disease.   Persistent diffuse peribronchial and peribronchiolar thickening with associated airway irregularity and occasional ectasia, consistent   with chronic asthma/bronchitis, with scattered bronchocentric bands and architectural distortion within both upper, right middle and left   greater than right lower lobes, consistent with postinflammatory pleural parenchymal scarring.  Near-complete resolution of mass-like consolidation within the left lower lobe with residual architectural distortion (2-56). Several   fluctuating subcentimeter nodules within the left lower lobe and resolved within the right lower lobe since 05/28/2011, likely airway-related inflammatory and mucus impaction.    Pathology:  LLL nodule 05/2011  LUNG NODULE, LEFT LOWER LOBE (BIOPSY):  - Acute and organizing pneumonia (see Comment) - Prominent chronic inflammation in interstitium  - Gram stain is negative for bacterial organisms  - GMS stain is negative for fungal organisms  - AFB stain is negative for acid-fast bacilli  - No neoplasms identified    PFT 07/2018  FVC 3.95 (94.8%),  FEV1  1.51 (48.7%)  DLCO 28.4 (100%)  PFT 09/2014:  FVC 4.04 (93.7%), post 4.436 (103.6%)  FEV1  1.29 (39.8%), post 1.67 (51.6%)  DLCO 100.7%  PFT 08/2010  FVC 4.80 (109%), post 4.99 (114%)  FEV 1.85 (55%), post 2.01 (60%)      Assessment:  Pt is a 67 yo male with now mild intermittent persistent asthma (severe fixed severity), intermittent episodes of allergic bronchopulmonary aspergillosis, h/o LLL nodule (bx in 2012 benign), OSA on APAP, and h/o afib s/p cardioversion, who is here for follow up.  ???  #mild intermittent asthma (severe fixed)- Symptoms well controlled on current regimen  #ABPA- Asymptomatic - follow up todays labs.   #LLL nodule (bx in 2012 showed benign)  #OSA on APAP. Adherence data unavailable but endorses adherence and mask issues       Recommendations:  - Will order for refill of supplies for his APAP. Will order a Wisp dreamware to trial and see if it will stay on his face better.   - Suggested to bring in his chip at his convenience to review data  - SoClean discussed. Pt advised to read user manual for recommended cleaning in addition to SoClean use   - Stop by lab today for blood work  - continue current asthma regimen  - continue excellent exercise / medidation routine  - high dose influenza vaccine today  - Annual labs and PFTs (prior to visit)    Education regarding sleep hygiene reviewed today.    Follow up as needed or in 21m      Discussed and seen with Dr. Carollee Massed, DO, MPH

## 2018-07-18 NOTE — Addendum Note
Encounter addended by: Ashley Jacobs., MD on: 07/18/2018 11:05 AM   Actions taken: Charge Capture section accepted

## 2018-07-21 DIAGNOSIS — Z23 Encounter for immunization: Secondary | ICD-10-CM | POA: Diagnosis not present

## 2018-07-30 ENCOUNTER — Telehealth: Payer: BLUE CROSS/BLUE SHIELD

## 2018-07-30 NOTE — Telephone Encounter
Good Afternoon,     Ref# [id number].   Please Submit to Vendor. Verified a covered benefit.  Thank you.  Maurice Ramirez A.

## 2018-08-03 NOTE — Telephone Encounter
Called pt to ask which vendor he is currently using and he advised that he does not need any current replacement supplies because he ordered some shortly after seeing Dr. Evelena Leyden.     He stated he will call me back tomorrow with the vendor information.

## 2018-08-20 MED ORDER — ADVAIR DISKUS 500-50 MCG/DOSE IN AEPB
12 refills | Status: AC
Start: 2018-08-20 — End: ?

## 2018-08-25 DIAGNOSIS — M5136 Other intervertebral disc degeneration, lumbar region: Secondary | ICD-10-CM | POA: Diagnosis not present

## 2018-08-25 DIAGNOSIS — M47816 Spondylosis without myelopathy or radiculopathy, lumbar region: Secondary | ICD-10-CM | POA: Diagnosis not present

## 2018-08-25 DIAGNOSIS — Z981 Arthrodesis status: Secondary | ICD-10-CM | POA: Diagnosis not present

## 2018-09-01 ENCOUNTER — Ambulatory Visit: Payer: BLUE CROSS/BLUE SHIELD

## 2018-09-01 DIAGNOSIS — G4730 Sleep apnea, unspecified: Secondary | ICD-10-CM

## 2018-09-01 NOTE — Progress Notes
PULMONARY/SLEEP MEDICINE NOTE    PCP: Babs Sciara, MD      Chief Complaint   Patient presents with   ??? Follow-up     Sleep Study       REFERRING PRACTITIONER: Lazaro Arms., MD  PRIMARY CARE PROVIDER: Babs Sciara, MD  CHIEF COMPLAINT: follow up on OSA     History of Presenting Illness:  Maurice Ramirez???is a 67 y.o. y.o.???male???with  moderate persistent asthma, intermittent episodes of allergic bronchopulmonary aspergillosis , h/o LLL nodule (bx in 2012 benign), OSA on PAP, and h/o afib s/p cardioversion, who is here for follow up. He does not has his PAP chip with him today.  His last chip check was in 2017.  We discussed using dream-mapper app and [place] mychart app to allow him to submit his own compliance report electronically.  He has been using his device every night, still wakes up with the mask off and does not recall why.  He is exercising daily (brisk walk/run outside).  He uses an MAD when he travels.  IgE level is stable.   ???  Asthma meds and hx:  Singulair 10mg  daily, advair 500/50 and albuterol (1x/3-4 days)  Last hospital visit for asthma was several years ago, intubated about 33yrs ago for a severe attack  Last use of prednisone dose for asthma (prior to sinus infections) was several years ago  ???    EPWORTH SLEEPINESS SCALE: 7    COMPLIANCE DATA: No new data today   Data Range:???02/05/2016 to 05/04/2016  Days with Device Usage:???86???days  Days without Device Usage: 4???days   % Days with Device Usage: 95.6???%   Average Usage (days used):???4???Hrs 49???Mins 37???Secs  % Days with Usage >???= 4 hours: 56.7???%   AHI (Apnea Hypopnea Index):???3.4  Auto CPAP Mean Pressure:???7.5???cmH2O  Auto CPAP Peak Average Pressure:??????9.9???cmH2O  Average Device Pressure <= 90% of Time: ???9.1???cmH2O  Average Time in Large Leak:???0???Hrs 5???Mins 26???Secs       Medications that the patient states to be currently taking   Medication Sig   ??? ADVAIR DISKUS 500-50 MCG/DOSE diskus USE 1 INHALATION TWICE A DAY ??? MONTELUKAST 10 mg tablet TAKE 1 TABLET EVERY EVENING   ??? VENTOLIN HFA 108 (90 Base) MCG/ACT inhaler USE 2 INHALATIONS EVERY 6 HOURS AS NEEDED       ROS: pertinent review of systems as detailed in the HPI. The rest of the complete review of systems is negative except as detailed in the written health and history questionnaire.      PHYSICAL EXAM:  BP 125/81  ~ Pulse 62  ~ Temp 36.6 ???C (97.8 ???F) (Oral)  ~ Resp 16  ~ Ht 5' 7.8'' (1.722 m)  ~ Wt 168 lb 6.4 oz (76.4 kg)  ~ SpO2 97%  ~ BMI 25.76 kg/m???   General: Well-developed male, in no acute distress.   HEENT: Normocephalic and atraumatic. Pupils are equal, round, and reactive to light. Extraocular movements intact. Nares: No turbinate edema. Oropharynx: Lips, gums, buccal mucosa, dentition Normal, Mallampati class IV airway. No exudates or lesions.   Neck: Supple.   Respiratory/Chest: Respirations non-labored. No accessory muscle use. clear to auscultation bilaterally. No crackles. No wheezes.   Cardiovascular: Regular rate and rhythm. No murmur, rub or gallop. No heave. Abdomen: Soft, non-tender, non-distended. Active bowel sounds.   Extremities: none lower extremity edema. No clubbing or cyanosis.  Neuro: Alert, oriented and appropriately interactive. No focal deficits. Speech fluent.   Psychological: normal affect    LABS:  Lab Results   Component Value Date    HGB 15.2 07/14/2018     No results found for: FERRITIN  Lab Results   Component Value Date    TSH 2.2 06/04/2011       IgE 1666 07/2018 (<-1422)  Imaging:  CT chest 2012  Unchanged biapical fibronodular pleural parenchymal scarring and calcified subcentimeter nodule within the right middle lobe (2-51),   consistent with prior granulomatous disease.???  Persistent diffuse peribronchial and peribronchiolar thickening with associated airway irregularity and occasional ectasia, consistent   with chronic asthma/bronchitis, with scattered bronchocentric bands and architectural distortion within both upper, right middle and left   greater than right lower lobes, consistent with postinflammatory pleural parenchymal scarring.  Near-complete resolution of mass-like consolidation within the left lower lobe with residual architectural distortion (2-56). Several   fluctuating subcentimeter nodules within the left lower lobe and resolved within the right lower lobe since 05/28/2011, likely airway-related inflammatory and mucus impaction.  ???  Pathology:  LLL nodule 05/2011  LUNG NODULE, LEFT LOWER LOBE (BIOPSY):  - Acute and organizing pneumonia (see Comment)  - Prominent chronic inflammation in interstitium  - Gram stain is negative for bacterial organisms  - GMS stain is negative for fungal organisms  - AFB stain is negative for acid-fast bacilli  - No neoplasms identified  ???  PFT 07/2018  FVC 3.95 (94.8%),  FEV1  1.51 (48.7%)  DLCO 28.4 (100%)  PFT 09/2014:  FVC 4.04 (93.7%), post 4.436 (103.6%)  FEV1  1.29 (39.8%), post 1.67 (51.6%)  DLCO 100.7%  PFT 08/2010  FVC 4.80 (109%), post 4.99 (114%)  FEV 1.85 (55%), post 2.01 (60%)  ???    COMPLIANCE DATA: not available    ASSESSMENT: 21M w/ OSA on PAP therapy, Asthma (FEV1 48%), and prior ABPA who presents for follow up of PAP therapy.      Obstructive Sleep Apnea:  Continue with PAP therapy, the patient has acclimated well and is experiencing symptomatic improvement.  Patient to submit adherence/data report to review and treatment response, but as of last check residual AHI 3.4.  The patient is willing to continue with PAP therapy     We discussed optimal sleep hygiene practices including maintaining a set schedule, consistent light exposure in the morning hours, minimizing alerting activities prior to bedtime, avoiding spending excessive time in bed while awake.    I discussed the plan in detail with the patient who voiced understanding and is in agreement.    >50% of the visit (40 minutes) was spent counseling and coordinating care. Follow up: ~6 months, sooner if needed.      Casey Burkitt

## 2018-09-08 ENCOUNTER — Ambulatory Visit (INDEPENDENT_AMBULATORY_CARE_PROVIDER_SITE_OTHER): Payer: Medicare Other | Admitting: General Surgery

## 2018-09-08 ENCOUNTER — Encounter: Payer: Self-pay | Admitting: General Surgery

## 2018-09-08 VITALS — BP 133/74 | HR 102 | Temp 97.7°F | Resp 18 | Wt 241.0 lb

## 2018-09-08 DIAGNOSIS — K625 Hemorrhage of anus and rectum: Secondary | ICD-10-CM | POA: Diagnosis not present

## 2018-09-08 NOTE — H&P (Signed)
Mark Cannon; [id number]; 05/06/1951   HPI Patient is a 67 year old white male who was referred to my care by Mark Cannon for evaluation treatment of rectal pain.  Patient has had intermittent rectal pain with defecation over the past 2 years.  Every time he moves his bowels, he has rectal pain and burning.  He does notice some blood on the toilet paper when he wipes himself.  He has undergone radiation therapy for prostate cancer.  He is status post a colonoscopy in 2013 with polypectomy for tubular adenoma in the descending colon.  He states he moves his bowels frequently.  He does have a rising PSA level, but they have not determined the etiology.  He currently has a pain level of 1 out of 10 rectal pain. Past Medical History:  Diagnosis Date  . Arthritis   . Cancer Mark Cannon)    Prostate, has had prostate removed, has had radiation and still has cancer,  . Colon polyps   . Cough   . GERD (gastroesophageal reflux disease)    occasional   . H/O tooth extraction week of 03/15/2015   . History of kidney stones   . Hypercholesteremia   . Hypertension   . Lumbar stenosis with neurogenic claudication   . Pneumonia   . Pre-diabetes   . Renal disorder   . Sleep apnea    No cpap    Past Surgical History:  Procedure Laterality Date  . APPENDECTOMY    . BACK SURGERY    . CHOLECYSTECTOMY    . COLONOSCOPY  02/04/2012   Procedure: COLONOSCOPY;  Surgeon: Mark So, MD;  [facility]: [facility];  Service: Gastroenterology;  Laterality: N/A;  . COLONOSCOPY W/ POLYPECTOMY    . HERNIA REPAIR     Bilateral inguinal hernia repair X 4  . hydrocelectomy    . left knee arthroscopy    . LITHOTRIPSY    . LYMPHADENECTOMY Bilateral 03/22/2015   Procedure: PELVIC LYMPHADENECTOMY;  Surgeon: Mark Frock, MD;  Location: [facility];  Service: Urology;  Laterality: Bilateral;  . NM MYOVIEW LTD  2011  . ROBOT ASSISTED LAPAROSCOPIC RADICAL PROSTATECTOMY N/A 03/22/2015   Procedure: ROBOTIC ASSISTED  LAPAROSCOPIC RADICAL PROSTATECTOMY WITH INDOCYANINE GREEN DYE INJECTION;  Surgeon: Mark Frock, MD;  Location: [facility];  Service: Urology;  Laterality: N/A;  . ROBOTIC ASSISTED LAPAROSCOPIC LYSIS OF ADHESION  03/22/2015   Procedure: ROBOTIC ASSISTED LAPAROSCOPIC LYSIS OF ADHESION;  Surgeon: Mark Frock, MD;  Location: [facility];  Service: Urology;;  . [name]      Family History  Problem Relation Age of Onset  . Heart failure Father   . Stroke Father   . Colon cancer Neg Hx     Current Outpatient Medications on File Prior to Visit  Medication Sig Dispense Refill  . albuterol (ACCUNEB) 1.25 MG/3ML nebulizer solution Take 1 ampule by nebulization every 6 (six) hours as needed for wheezing.    . cetirizine (ZYRTEC) 10 MG tablet Take 10 mg by mouth daily.    Marland Kitchen lisinopril-hydrochlorothiazide (PRINZIDE,ZESTORETIC) 20-25 MG per tablet Take 1 tablet by mouth daily.    . meloxicam (MOBIC) 15 MG tablet Take 15 mg by mouth daily.    . Misc Natural Products (T-RELIEF CBD+13 SL) Place 10 drops under the tongue 2 (two) times daily.    . ranitidine (ZANTAC) 150 MG tablet Take 150 mg by mouth daily as needed for heartburn.     . traMADol (ULTRAM) 50 MG tablet Take 50 mg by mouth every  6 (six) hours as needed.     No current facility-administered medications on file prior to visit.     Allergies  Allergen Reactions  . Lidocaine Hives  . Benzocaine Other (See Comments)    Blisters     Social History   Substance and Sexual Activity  Alcohol Use Yes  . Alcohol/week: 1.0 standard drinks  . Types: 1 Glasses of wine per week   Comment: occasional    Social History   Tobacco Use  Smoking Status Current Every Day Smoker  . Packs/day: 1.00  . Years: 50.00  . Pack years: 50.00  . Types: Cigarettes  Smokeless Tobacco Never Used    Review of Systems  Constitutional: Positive for malaise/fatigue.  HENT: Positive for sinus pain.   Eyes: Negative.   Respiratory: Positive for cough,  shortness of breath and wheezing.   Cardiovascular: Negative.   Gastrointestinal: Negative.   Genitourinary: Positive for dysuria and frequency.  Musculoskeletal: Positive for back pain and joint pain.  Skin: Negative.   Neurological: Negative.   Endo/Heme/Allergies: Negative.   Psychiatric/Behavioral: Negative.     Objective   Vitals:   09/08/18 0934  BP: 133/74  Pulse: (!) 102  Resp: 18  Temp: 97.7 F (36.5 C)    Physical Exam  Constitutional: He is oriented to person, place, and time. He appears well-developed and well-nourished. No distress.  HENT:  Head: Normocephalic and atraumatic.  Cardiovascular: Normal rate, regular rhythm and normal heart sounds. Exam reveals no gallop and no friction rub.  No murmur heard. Pulmonary/Chest: Effort normal and breath sounds normal. No stridor. No respiratory distress. He has no wheezes. He has no rales.  Distant lung sounds noted.  Abdominal: Soft. Bowel sounds are normal. He exhibits no distension and no mass. There is no tenderness. There is no guarding.  Genitourinary:  Genitourinary Comments: Rectal examination deferred to colonoscopy  Neurological: He is alert and oriented to person, place, and time.  Skin: Skin is warm and dry.  Vitals reviewed.  Primary care notes reviewed Assessment  Rectal pain and bleeding, history of colon polyp, status post radiation therapy to prostate for prostate cancer Plan   Patient is scheduled for a colonoscopy on 09/22/2018.  The risks and benefits of the procedure including bleeding and perforation were fully explained to the patient, who gave informed consent.  Suprep bowel prep prescribed.

## 2018-09-08 NOTE — Progress Notes (Signed)
Mark Cannon; [id number]; 06-27-1951   HPI Patient is a 67 year old white male who was referred to my care by Mark Cannon for evaluation treatment of rectal pain.  Patient has had intermittent rectal pain with defecation over the past 2 years.  Every time he moves his bowels, he has rectal pain and burning.  He does notice some blood on the toilet paper when he wipes himself.  He has undergone radiation therapy for prostate cancer.  He is status post a colonoscopy in 2013 with polypectomy for tubular adenoma in the descending colon.  He states he moves his bowels frequently.  He does have a rising PSA level, but they have not determined the etiology.  He currently has a pain level of 1 out of 10 rectal pain. Past Medical History:  Diagnosis Date  . Arthritis   . Cancer [facility])    Prostate, has had prostate removed, has had radiation and still has cancer,  . Colon polyps   . Cough   . GERD (gastroesophageal reflux disease)    occasional   . H/O tooth extraction week of 03/15/2015   . History of kidney stones   . Hypercholesteremia   . Hypertension   . Lumbar stenosis with neurogenic claudication   . Pneumonia   . Pre-diabetes   . Renal disorder   . Sleep apnea    No cpap    Past Surgical History:  Procedure Laterality Date  . APPENDECTOMY    . BACK SURGERY    . CHOLECYSTECTOMY    . COLONOSCOPY  02/04/2012   Procedure: COLONOSCOPY;  Surgeon: Mark So, MD;  [facility];  Service: Gastroenterology;  Laterality: N/A;  . COLONOSCOPY W/ POLYPECTOMY    . HERNIA REPAIR     Bilateral inguinal hernia repair X 4  . hydrocelectomy    . left knee arthroscopy    . LITHOTRIPSY    . LYMPHADENECTOMY Bilateral 03/22/2015   Procedure: PELVIC LYMPHADENECTOMY;  Surgeon: Mark Frock, MD;  Location: [facility];  Service: Urology;  Laterality: Bilateral;  . NM MYOVIEW LTD  2011  . ROBOT ASSISTED LAPAROSCOPIC RADICAL PROSTATECTOMY N/A 03/22/2015   Procedure: ROBOTIC ASSISTED  LAPAROSCOPIC RADICAL PROSTATECTOMY WITH INDOCYANINE GREEN DYE INJECTION;  Surgeon: Mark Frock, MD;  Location: [facility];  Service: Urology;  Laterality: N/A;  . ROBOTIC ASSISTED LAPAROSCOPIC LYSIS OF ADHESION  03/22/2015   Procedure: ROBOTIC ASSISTED LAPAROSCOPIC LYSIS OF ADHESION;  Surgeon: Mark Frock, MD;  Location: [facility];  Service: Urology;;  . Mark Cannon      Family History  Problem Relation Age of Onset  . Heart failure Father   . Stroke Father   . Colon cancer Neg Hx     Current Outpatient Medications on File Prior to Visit  Medication Sig Dispense Refill  . albuterol (ACCUNEB) 1.25 MG/3ML nebulizer solution Take 1 ampule by nebulization every 6 (six) hours as needed for wheezing.    . cetirizine (ZYRTEC) 10 MG tablet Take 10 mg by mouth daily.    Marland Kitchen lisinopril-hydrochlorothiazide (PRINZIDE,ZESTORETIC) 20-25 MG per tablet Take 1 tablet by mouth daily.    . meloxicam (MOBIC) 15 MG tablet Take 15 mg by mouth daily.    . Misc Natural Products (T-RELIEF CBD+13 SL) Place 10 drops under the tongue 2 (two) times daily.    . ranitidine (ZANTAC) 150 MG tablet Take 150 mg by mouth daily as needed for heartburn.     . traMADol (ULTRAM) 50 MG tablet Take 50 mg by mouth every  6 (six) hours as needed.     No current facility-administered medications on file prior to visit.     Allergies  Allergen Reactions  . Lidocaine Hives  . Benzocaine Other (See Comments)    Blisters     Social History   Substance and Sexual Activity  Alcohol Use Yes  . Alcohol/week: 1.0 standard drinks  . Types: 1 Glasses of wine per week   Comment: occasional    Social History   Tobacco Use  Smoking Status Current Every Day Smoker  . Packs/day: 1.00  . Years: 50.00  . Pack years: 50.00  . Types: Cigarettes  Smokeless Tobacco Never Used    Review of Systems  Constitutional: Positive for malaise/fatigue.  HENT: Positive for sinus pain.   Eyes: Negative.   Respiratory: Positive for cough,  shortness of breath and wheezing.   Cardiovascular: Negative.   Gastrointestinal: Negative.   Genitourinary: Positive for dysuria and frequency.  Musculoskeletal: Positive for back pain and joint pain.  Skin: Negative.   Neurological: Negative.   Endo/Heme/Allergies: Negative.   Psychiatric/Behavioral: Negative.     Objective   Vitals:   09/08/18 0934  BP: 133/74  Pulse: (!) 102  Resp: 18  Temp: 97.7 F (36.5 C)    Physical Exam  Constitutional: He is oriented to person, place, and time. He appears well-developed and well-nourished. No distress.  HENT:  Head: Normocephalic and atraumatic.  Cardiovascular: Normal rate, regular rhythm and normal heart sounds. Exam reveals no gallop and no friction rub.  No murmur heard. Pulmonary/Chest: Effort normal and breath sounds normal. No stridor. No respiratory distress. He has no wheezes. He has no rales.  Distant lung sounds noted.  Abdominal: Soft. Bowel sounds are normal. He exhibits no distension and no mass. There is no tenderness. There is no guarding.  Genitourinary:  Genitourinary Comments: Rectal examination deferred to colonoscopy  Neurological: He is alert and oriented to person, place, and time.  Skin: Skin is warm and dry.  Vitals reviewed.  Primary care notes reviewed Assessment  Rectal pain and bleeding, history of colon polyp, status post radiation therapy to prostate for prostate cancer Plan   Patient is scheduled for a colonoscopy on 09/22/2018.  The risks and benefits of the procedure including bleeding and perforation were fully explained to the patient, who gave informed consent.  Suprep bowel prep prescribed.

## 2018-09-08 NOTE — Patient Instructions (Signed)
Colonoscopy, Adult A colonoscopy is an exam to look at the entire large intestine. During the exam, a lubricated, bendable tube is inserted into the anus and then passed into the rectum, colon, and other parts of the large intestine. A colonoscopy is often done as a part of normal colorectal screening or in response to certain symptoms, such as anemia, persistent diarrhea, abdominal pain, and blood in the stool. The exam can help screen for and diagnose medical problems, including:  Tumors.  Polyps.  Inflammation.  Areas of bleeding.  Tell a health care provider about:  Any allergies you have.  All medicines you are taking, including vitamins, herbs, eye drops, creams, and over-the-counter medicines.  Any problems you or family members have had with anesthetic medicines.  Any blood disorders you have.  Any surgeries you have had.  Any medical conditions you have.  Any problems you have had passing stool. What are the risks? Generally, this is a safe procedure. However, problems may occur, including:  Bleeding.  A tear in the intestine.  A reaction to medicines given during the exam.  Infection (rare).  What happens before the procedure? Eating and drinking restrictions Follow instructions from your health care provider about eating and drinking, which may include:  A few days before the procedure - follow a low-fiber diet. Avoid nuts, seeds, dried fruit, raw fruits, and vegetables.  1-3 days before the procedure - follow a clear liquid diet. Drink only clear liquids, such as clear broth or bouillon, black coffee or tea, clear juice, clear soft drinks or sports drinks, gelatin dessert, and popsicles. Avoid any liquids that contain red or purple dye.  On the day of the procedure - do not eat or drink anything during the 2 hours before the procedure, or within the time period that your health care provider recommends.  Bowel prep If you were prescribed an oral bowel prep  to clean out your colon:  Take it as told by your health care provider. Starting the day before your procedure, you will need to drink a large amount of medicated liquid. The liquid will cause you to have multiple loose stools until your stool is almost clear or light green.  If your skin or anus gets irritated from diarrhea, you may use these to relieve the irritation: ? Medicated wipes, such as adult wet wipes with aloe and vitamin E. ? A skin soothing-product like petroleum jelly.  If you vomit while drinking the bowel prep, take a break for up to 60 minutes and then begin the bowel prep again. If vomiting continues and you cannot take the bowel prep without vomiting, call your health care provider.  General instructions  Ask your health care provider about changing or stopping your regular medicines. This is especially important if you are taking diabetes medicines or blood thinners.  Plan to have someone take you home from the hospital or clinic. What happens during the procedure?  An IV tube may be inserted into one of your veins.  You will be given medicine to help you relax (sedative).  To reduce your risk of infection: ? Your health care team will wash or sanitize their hands. ? Your anal area will be washed with soap.  You will be asked to lie on your side with your knees bent.  Your health care provider will lubricate a long, thin, flexible tube. The tube will have a camera and a light on the end.  The tube will be inserted into your   anus.  The tube will be gently eased through your rectum and colon.  Air will be delivered into your colon to keep it open. You may feel some pressure or cramping.  The camera will be used to take images during the procedure.  A small tissue sample may be removed from your body to be examined under a microscope (biopsy). If any potential problems are found, the tissue will be sent to a lab for testing.  If small polyps are found, your  health care provider may remove them and have them checked for cancer cells.  The tube that was inserted into your anus will be slowly removed. The procedure may vary among health care providers and hospitals. What happens after the procedure?  Your blood pressure, heart rate, breathing rate, and blood oxygen level will be monitored until the medicines you were given have worn off.  Do not drive for 24 hours after the exam.  You may have a small amount of blood in your stool.  You may pass gas and have mild abdominal cramping or bloating due to the air that was used to inflate your colon during the exam.  It is up to you to get the results of your procedure. Ask your health care provider, or the department performing the procedure, when your results will be ready. This information is not intended to replace advice given to you by your health care provider. Make sure you discuss any questions you have with your health care provider. Document Released: 09/27/2000 Document Revised: 07/31/2016 Document Reviewed: 12/12/2015 Elsevier Interactive Patient Education  2018 [employer]  

## 2018-09-14 DIAGNOSIS — C61 Malignant neoplasm of prostate: Secondary | ICD-10-CM | POA: Diagnosis not present

## 2018-09-16 ENCOUNTER — Telehealth (HOSPITAL_COMMUNITY): Payer: Self-pay

## 2018-09-16 ENCOUNTER — Ambulatory Visit (HOSPITAL_COMMUNITY): Payer: Medicare Other | Attending: Neurosurgery

## 2018-09-16 ENCOUNTER — Encounter (HOSPITAL_COMMUNITY): Payer: Self-pay

## 2018-09-16 DIAGNOSIS — M6281 Muscle weakness (generalized): Secondary | ICD-10-CM | POA: Diagnosis not present

## 2018-09-16 DIAGNOSIS — G8929 Other chronic pain: Secondary | ICD-10-CM | POA: Diagnosis not present

## 2018-09-16 DIAGNOSIS — R262 Difficulty in walking, not elsewhere classified: Secondary | ICD-10-CM | POA: Insufficient documentation

## 2018-09-16 DIAGNOSIS — M5442 Lumbago with sciatica, left side: Secondary | ICD-10-CM | POA: Diagnosis not present

## 2018-09-16 NOTE — Therapy (Signed)
[place], [place], [address] Phone: [phone]   Fax:  [phone]  Physical Therapy Evaluation  Patient Details  Name: Mark Cannon MRN: [id number] Date of Birth: 29-Aug-1999 Referring Provider (PT): [name], MD   Encounter Date: 09/16/2018  PT End of Session - 09/16/18 1131    Visit Number  1    Number of Visits  7    Date for PT Re-Evaluation  10/07/18    Authorization Type  Medicare (Secondary: Generic [employer])    Authorization Time Period  09/16/18 to 10/01/18    Authorization - Visit Number  1    Authorization - Number of Visits  10    PT Start Time  1030    PT Stop Time  1125    PT Time Calculation (min)  55 min    Activity Tolerance  Patient tolerated treatment well    Behavior During Therapy  [facility] for tasks assessed/performed       Past Medical History:  Diagnosis Date  . Arthritis   . Cancer [facility])    Prostate, has had prostate removed, has had radiation and still has cancer,  . Colon polyps   . Cough   . GERD (gastroesophageal reflux disease)    occasional   . H/O tooth extraction week of 03/15/2015   . History of kidney stones   . Hypercholesteremia   . Hypertension   . Lumbar stenosis with neurogenic claudication   . Pneumonia   . Pre-diabetes   . Renal disorder   . Sleep apnea    No cpap    Past Surgical History:  Procedure Laterality Date  . APPENDECTOMY    . BACK SURGERY    . CHOLECYSTECTOMY    . COLONOSCOPY  02/04/2012   Procedure: COLONOSCOPY;  Surgeon: Jamesetta So, MD;  [facility];  Service: Gastroenterology;  Laterality: N/A;  . COLONOSCOPY W/ POLYPECTOMY    . HERNIA REPAIR     Bilateral inguinal hernia repair X 4  . hydrocelectomy    . left knee arthroscopy    . LITHOTRIPSY    . LYMPHADENECTOMY Bilateral 03/22/2015   Procedure: PELVIC LYMPHADENECTOMY;  Surgeon: Alexis Frock, MD;  Location: [facility];  Service: Urology;  Laterality: Bilateral;  . NM  MYOVIEW LTD  2011  . ROBOT ASSISTED LAPAROSCOPIC RADICAL PROSTATECTOMY N/A 03/22/2015   Procedure: ROBOTIC ASSISTED LAPAROSCOPIC RADICAL PROSTATECTOMY WITH INDOCYANINE GREEN DYE INJECTION;  Surgeon: Alexis Frock, MD;  [facility];  Service: Urology;  Laterality: N/A;  . ROBOTIC ASSISTED LAPAROSCOPIC LYSIS OF ADHESION  03/22/2015   Procedure: ROBOTIC ASSISTED LAPAROSCOPIC LYSIS OF ADHESION;  Surgeon: Alexis Frock, MD;  Location: [facility];  Service: Urology;;  . [name]      There were no vitals filed for this visit.   Subjective Assessment - 09/16/18 1034    Subjective  Pt reports undergoing bil L4-5 decompression by Dr. Sherwood Gambler on 06/01/18. He states that he had put this surgery off for about 17 years and then it got to the point that he couldn't walk longer than 10 mins due to pain and BLE symptoms. He states that nothing has improve since his surgery. He reports that most of his pain is on his LLE; dull, constant, nagging pain down LLE. He is not sure if he is still on BLT precautions or not; but he was told that he could stop using his brace at his last visit. He's having the most difficulty  with walking and standing for long periods of time. No falls in the last 6 months    Pertinent History  current prostate CA    Limitations  Standing;Walking    How long can you sit comfortably?  no issues    How long can you stand comfortably?  10 mins    How long can you walk comfortably?  10 mins    Patient Stated Goals  get better movement and stand longer than 10 mins    Currently in Pain?  No/denies         [facility] PT Assessment - 09/16/18 0001      Assessment   Medical Diagnosis  Arthrodesis status -- Lumbar reconditioning and core strengthening - s/p L4-5 decompression    Referring Provider (PT)  [name], MD    Onset Date/Surgical Date  06/01/18    Next MD Visit  December 22, 2018    Prior Therapy  yes for R RTC      Precautions   Precautions  Back    Precaution Comments  this  PT will call his MD to f/u on if he is on any lumbar precautions      Balance Screen   Has the patient fallen in the past 6 months  No    Has the patient had a decrease in activity level because of a fear of falling?   No    Is the patient reluctant to leave their home because of a fear of falling?   No      Prior Function   Level of Independence  Independent    Vocation  Retired    Leisure  plays live music with Blues band, play golf      Observation/Other Assessments   Other Surveys   Other Surveys    Oswestry Disability Index   16/50 -- moderate disability due to LBP      Posture/Postural Control   Posture/Postural Control  Postural limitations    Postural Limitations  Rounded Shoulders;Forward head;Increased thoracic kyphosis;Increased lumbar lordosis      ROM / Strength   AROM / PROM / Strength  AROM;Strength      AROM   AROM Assessment Site  Lumbar    Lumbar Flexion  knee to mid shin    Lumbar Extension  50% limited    Lumbar - Right Side Bend  knee joint    Lumbar - Left Side Bend  knee joint    Lumbar - Right Rotation  25% limited    Lumbar - Left Rotation  25% limited      Strength   Strength Assessment Site  Hip;Knee;Ankle    Right Hip Flexion  5/5    Right Hip Extension  4-/5    Right Hip ABduction  4+/5    Left Hip Flexion  5/5    Left Hip Extension  4-/5    Left Hip ABduction  4/5    Right Knee Flexion  4+/5    Right Knee Extension  4+/5    Left Knee Flexion  5/5    Left Knee Extension  4+/5    Right Ankle Dorsiflexion  5/5    Left Ankle Dorsiflexion  5/5      Flexibility   Soft Tissue Assessment /Muscle Length  yes    Hamstrings  90/90: R: 145deg, L: 138deg      Palpation   Spinal mobility  hypomobile throughout    Palpation comment  increased soft tissue restrictions thorughout bil  lumbar paraspinals, non-painful to palpation      Special Tests    Special Tests  Lumbar    Lumbar Tests  Straight Leg Raise      Straight Leg Raise   Findings   Negative    Comment  BLE      Ambulation/Gait   Ambulation Distance (Feet)  534 Feet   6MWT   Assistive device  None      Balance   Balance Assessed  Yes      Static Standing Balance   Static Standing - Balance Support  No upper extremity supported    Static Standing Balance -  Activities   Single Leg Stance - Right Leg;Single Leg Stance - Left Leg    Static Standing - Comment/# of Minutes  R: 10sec L: 15 sec      Standardized Balance Assessment   Standardized Balance Assessment  Five Times Sit to Stand    Five times sit to stand comments   10.8sec, no UE, (arms crossed over chest)            Objective measurements completed on examination: See above findings.        PT Education - 09/16/18 1131    Education Details  exam findings, HEP, POC    Person(s) Educated  Patient    Methods  Explanation;Handout;Demonstration    Comprehension  Verbalized understanding       PT Short Term Goals - 09/16/18 1135      PT SHORT TERM GOAL #1   Title  Pt will be independent with HEP and perform consistently in order to reduce pain and improve strength.    Time  1    Period  Weeks    Status  New    Target Date  09/23/18      PT SHORT TERM GOAL #2   Title  Pt will have improved bil HS length to 150deg in 90/90 position in order to reduce pain and improve overall functional mobility.    Time  1    Period  Weeks    Status  New      PT SHORT TERM GOAL #3   Title  Pt will be able to ambulate 756ft during 6MWT with 5/10 LLE pain with 1 standing break or less in order to demo improved tolerance to ambulation.    Time  2    Period  Weeks    Status  New    Target Date  09/30/18        PT Long Term Goals - 09/16/18 1136      PT LONG TERM GOAL #1   Title  Pt will have improved proximal hip strength to 4+/5 or better in order to reduce LBP and maximize his functional mobility.    Time  3    Period  Weeks    Status  New    Target Date  10/07/18      PT LONG TERM GOAL #2    Title  Pt will report being able to stand for 30 mins in order to allow him to prepare a meal for his family with greater ease.     Time  3    Period  Weeks    Status  New      PT LONG TERM GOAL #3   Title  Pt will be able to ambulate 933ft during 6MWT with 3/10 LLE pain or better without requiring rest break in order to further demo improved tolerance  to ambulation and maximize his community access.    Time  3    Period  Weeks    Status  New      PT LONG TERM GOAL #4   Title  Pt will score 14 or < on the Oswestry LBP Scale to demo reduced self-perceived funcitonal disability due ot his back pain.     Time  3    Period  Weeks    Status  New             Plan - 09/16/18 1132    Clinical Impression Statement  Pt is pleasant 67 YO M who presents to OPPT s/p L4-5 decompression surgery on 06/01/18 by Dr. Sherwood Gambler. Pain and limited mobility is what led to his surgery. He is still having LBP and LLE radicular pain since his surgery, but denies any n/t, shooting pains, or b/b changes since. He has deficits in core strength, BLE strength, functional strength, functional mobility, and tolerance to WB, limiting his participation at home and in the community. He required 1 seated rest break during 6MWT due to 7/10 pain in L hip which required ~3 mins rest to recover from and he was only able to ambulate 573ft; normative values for his aged-matched peers for the 6MWT is 1877ft. He scored 16/50 on the Oswestry LBP Scale indicating moderate self-perceived disability due to pain. Pt needs skilled PT intervention to address these impairments in order to reduce pain and maximize QOL.    History and Personal Factors relevant to plan of care:  current prostate CA; chronicity of pain leading up to surgery    Clinical Presentation  Stable    Clinical Decision Making  Low    Rehab Potential  Fair    PT Frequency  2x / week    PT Duration  3 weeks    PT Treatment/Interventions  ADLs/Self Care Home  Management;Aquatic Therapy;Cryotherapy;Electrical Stimulation;Moist Heat;Ultrasound;Gait training;Stair training;Functional mobility training;Therapeutic activities;Therapeutic exercise;Balance training;Neuromuscular re-education;Patient/family education;Manual techniques;Scar mobilization;Passive range of motion;Dry needling;Taping    PT Next Visit Plan  review goals; begin core, BLE, functional strengthening, hip, lumbar, and thoracic mobility, progressing as able    PT Home Exercise Plan  eval: supine 90/90 HS stretch, bridging    Consulted and Agree with Plan of Care  Patient       Patient will benefit from skilled therapeutic intervention in order to improve the following deficits and impairments:  Abnormal gait, Decreased activity tolerance, Decreased balance, Decreased endurance, Decreased mobility, Decreased range of motion, Decreased strength, Difficulty walking, Hypomobility, Increased fascial restricitons, Increased muscle spasms, Impaired flexibility, Improper body mechanics, Pain  Visit Diagnosis: Chronic bilateral low back pain with left-sided sciatica - Plan: PT plan of care cert/re-cert  Difficulty in walking, not elsewhere classified - Plan: PT plan of care cert/re-cert  Muscle weakness (generalized) - Plan: PT plan of care cert/re-cert     Problem List Patient Active Problem List   Diagnosis Date Noted  . Lumbar stenosis with neurogenic claudication 06/01/2018  . Prostate cancer ([place]) 03/22/2015      Geraldine Solar PT, [place] [address] [place], [place], [address] Phone: [phone]   Fax:  [phone]  Name: KENDRY PFARR MRN: [id number] Date of Birth: 11-Apr-1951

## 2018-09-16 NOTE — Telephone Encounter (Signed)
Changed time

## 2018-09-16 NOTE — Patient Instructions (Signed)
Access Code: [email or web address]  URL: [email or web address]  Date: 09/16/2018  Prepared by: Geraldine Solar   Exercises Hooklying Hamstring Stretch with Strap - 3-5 reps - 30-60sec hold - 1-2x daily - 7x weekly Supine Bridge - 10 reps - 3 sets - 1-2x daily - 7x weekly

## 2018-09-17 DIAGNOSIS — J449 Chronic obstructive pulmonary disease, unspecified: Secondary | ICD-10-CM | POA: Diagnosis not present

## 2018-09-17 DIAGNOSIS — M545 Low back pain: Secondary | ICD-10-CM | POA: Diagnosis not present

## 2018-09-17 DIAGNOSIS — I10 Essential (primary) hypertension: Secondary | ICD-10-CM | POA: Diagnosis not present

## 2018-09-17 DIAGNOSIS — F17200 Nicotine dependence, unspecified, uncomplicated: Secondary | ICD-10-CM | POA: Diagnosis not present

## 2018-09-18 ENCOUNTER — Encounter (HOSPITAL_COMMUNITY): Payer: Self-pay | Admitting: Physical Therapy

## 2018-09-18 ENCOUNTER — Ambulatory Visit (HOSPITAL_COMMUNITY): Payer: Medicare Other | Admitting: Physical Therapy

## 2018-09-18 DIAGNOSIS — G8929 Other chronic pain: Secondary | ICD-10-CM

## 2018-09-18 DIAGNOSIS — R262 Difficulty in walking, not elsewhere classified: Secondary | ICD-10-CM

## 2018-09-18 DIAGNOSIS — M6281 Muscle weakness (generalized): Secondary | ICD-10-CM

## 2018-09-18 DIAGNOSIS — M5442 Lumbago with sciatica, left side: Principal | ICD-10-CM

## 2018-09-18 NOTE — Therapy (Signed)
[place], [place], [address] Phone: [phone]   Fax:  [phone]  Physical Therapy Treatment  Patient Details  Name: Mark Cannon MRN: [id number] Date of Birth: [birth year] Referring Provider (PT): [name], MD   Encounter Date: 09/18/2018  PT End of Session - 09/18/18 1351    Visit Number  2    Number of Visits  7    Date for PT Re-Evaluation  10/07/18    Authorization Type  Medicare (Secondary: Generic [employer])    Authorization Time Period  09/16/18 to 10/01/18    Authorization - Visit Number  2    Authorization - Number of Visits  10    PT Start Time  6378    PT Stop Time  1427    PT Time Calculation (min)  38 min    Activity Tolerance  Patient tolerated treatment well    Behavior During Therapy  [facility] for tasks assessed/performed       Past Medical History:  Diagnosis Date  . Arthritis   . Cancer [facility])    Prostate, has had prostate removed, has had radiation and still has cancer,  . Colon polyps   . Cough   . GERD (gastroesophageal reflux disease)    occasional   . H/O tooth extraction week of 03/15/2015   . History of kidney stones   . Hypercholesteremia   . Hypertension   . Lumbar stenosis with neurogenic claudication   . Pneumonia   . Pre-diabetes   . Renal disorder   . Sleep apnea    No cpap    Past Surgical History:  Procedure Laterality Date  . APPENDECTOMY    . BACK SURGERY    . CHOLECYSTECTOMY    . COLONOSCOPY  02/04/2012   Procedure: COLONOSCOPY;  Surgeon: Jamesetta So, MD;  [facility];  Service: Gastroenterology;  Laterality: N/A;  . COLONOSCOPY W/ POLYPECTOMY    . HERNIA REPAIR     Bilateral inguinal hernia repair X 4  . hydrocelectomy    . left knee arthroscopy    . LITHOTRIPSY    . LYMPHADENECTOMY Bilateral 03/22/2015   Procedure: PELVIC LYMPHADENECTOMY;  Surgeon: Alexis Frock, MD;  [facility];  Service: Urology;  Laterality: Bilateral;  . NM  MYOVIEW LTD  2011  . ROBOT ASSISTED LAPAROSCOPIC RADICAL PROSTATECTOMY N/A 03/22/2015   Procedure: ROBOTIC ASSISTED LAPAROSCOPIC RADICAL PROSTATECTOMY WITH INDOCYANINE GREEN DYE INJECTION;  Surgeon: Alexis Frock, MD;  [facility];  Service: Urology;  Laterality: N/A;  . ROBOTIC ASSISTED LAPAROSCOPIC LYSIS OF ADHESION  03/22/2015   Procedure: ROBOTIC ASSISTED LAPAROSCOPIC LYSIS OF ADHESION;  Surgeon: Alexis Frock, MD;  [facility];  Service: Urology;;  . [name]      There were no vitals filed for this visit.  Subjective Assessment - 09/18/18 1350    Subjective  Patient denied any pain currently. Stated he hasn't been able to do his exercises yet.     Pertinent History  current prostate CA    Limitations  Standing;Walking    How long can you sit comfortably?  no issues    How long can you stand comfortably?  10 mins    How long can you walk comfortably?  10 mins    Patient Stated Goals  get better movement and stand longer than 10 mins    Currently in Pain?  No/denies  [facility] Adult PT Treatment/Exercise - 09/18/18 0001      Exercises   Exercises  Knee/Hip;Lumbar      Lumbar Exercises: Supine   Ab Set  15 reps    AB Set Limitations  3'' holds    Bent Knee Raise  20 reps;3 seconds    Bent Knee Raise Limitations  Alternating LEs      Lumbar Exercises: Sidelying   Clam  Right;Left;15 reps      Knee/Hip Exercises: Stretches   Passive Hamstring Stretch  Right;Left;3 reps;30 seconds    Passive Hamstring Stretch Limitations  With belt      Knee/Hip Exercises: Supine   Bridges  Both;10 reps    Bridges Limitations  Partial range             PT Education - 09/18/18 1351    Education Details  Evaluation and goals    Person(s) Educated  Patient    Methods  Explanation    Comprehension  Verbalized understanding       PT Short Term Goals - 09/18/18 1352      PT SHORT TERM GOAL #1   Title  Pt will be independent with HEP and  perform consistently in order to reduce pain and improve strength.    Time  1    Period  Weeks    Status  On-going      PT SHORT TERM GOAL #2   Title  Pt will have improved bil HS length to 150deg in 90/90 position in order to reduce pain and improve overall functional mobility.    Time  1    Period  Weeks    Status  On-going      PT SHORT TERM GOAL #3   Title  Pt will be able to ambulate 780ft during 6MWT with 5/10 LLE pain with 1 standing break or less in order to demo improved tolerance to ambulation.    Time  2    Period  Weeks    Status  On-going        PT Long Term Goals - 09/18/18 1356      PT LONG TERM GOAL #1   Title  Pt will have improved proximal hip strength to 4+/5 or better in order to reduce LBP and maximize his functional mobility.    Time  3    Period  Weeks    Status  On-going      PT LONG TERM GOAL #2   Title  Pt will report being able to stand for 30 mins in order to allow him to prepare a meal for his family with greater ease.     Time  3    Period  Weeks    Status  On-going      PT LONG TERM GOAL #3   Title  Pt will be able to ambulate 964ft during 6MWT with 3/10 LLE pain or better without requiring rest break in order to further demo improved tolerance to ambulation and maximize his community access.    Time  3    Period  Weeks    Status  On-going      PT LONG TERM GOAL #4   Title  Pt will score 14 or < on the Oswestry LBP Scale to demo reduced self-perceived funcitonal disability due ot his back pain.     Time  3    Period  Weeks    Status  On-going  Plan - 09/18/18 1406    Clinical Impression Statement  This session began by reviewing patient's evaluation and goals. Then reviewed patient's HEP which patient required frequent verbal cues and tactile cues. Patient performed exercises slowly this session. This session progressed patient with lower extremity strengthening exercises including clams as well as abdominal strengthening  exercises. Patient would benefit from continued skilled physical therapy in order to continue progressing towards functional goals.     Rehab Potential  Fair    PT Frequency  2x / week    PT Duration  3 weeks    PT Treatment/Interventions  ADLs/Self Care Home Management;Aquatic Therapy;Cryotherapy;Electrical Stimulation;Moist Heat;Ultrasound;Gait training;Stair training;Functional mobility training;Therapeutic activities;Therapeutic exercise;Balance training;Neuromuscular re-education;Patient/family education;Manual techniques;Scar mobilization;Passive range of motion;Dry needling;Taping    PT Next Visit Plan  Continue core, BLE, functional strengthening, hip, lumbar, and thoracic mobility, progressing as able    PT Home Exercise Plan  eval: supine 90/90 HS stretch, bridging    Consulted and Agree with Plan of Care  Patient       Patient will benefit from skilled therapeutic intervention in order to improve the following deficits and impairments:  Abnormal gait, Decreased activity tolerance, Decreased balance, Decreased endurance, Decreased mobility, Decreased range of motion, Decreased strength, Difficulty walking, Hypomobility, Increased fascial restricitons, Increased muscle spasms, Impaired flexibility, Improper body mechanics, Pain  Visit Diagnosis: Chronic bilateral low back pain with left-sided sciatica  Difficulty in walking, not elsewhere classified  Muscle weakness (generalized)     Problem List Patient Active Problem List   Diagnosis Date Noted  . Lumbar stenosis with neurogenic claudication 06/01/2018  . Prostate cancer ([place]) 03/22/2015   Clarene Critchley PT, DPT 2:34 PM, 09/18/18 [place] [address] [place], [place], [address] Phone: [phone]   Fax:  [phone]  Name: Mark Cannon MRN: [id number] Date of Birth: Jul 30, 1951

## 2018-09-21 ENCOUNTER — Ambulatory Visit (HOSPITAL_COMMUNITY): Payer: Medicare Other | Admitting: Physical Therapy

## 2018-09-21 DIAGNOSIS — R262 Difficulty in walking, not elsewhere classified: Secondary | ICD-10-CM | POA: Diagnosis not present

## 2018-09-21 DIAGNOSIS — M5442 Lumbago with sciatica, left side: Secondary | ICD-10-CM | POA: Diagnosis not present

## 2018-09-21 DIAGNOSIS — G8929 Other chronic pain: Secondary | ICD-10-CM

## 2018-09-21 DIAGNOSIS — M6281 Muscle weakness (generalized): Secondary | ICD-10-CM | POA: Diagnosis not present

## 2018-09-21 DIAGNOSIS — N393 Stress incontinence (female) (male): Secondary | ICD-10-CM | POA: Diagnosis not present

## 2018-09-21 DIAGNOSIS — N5201 Erectile dysfunction due to arterial insufficiency: Secondary | ICD-10-CM | POA: Diagnosis not present

## 2018-09-21 DIAGNOSIS — N3040 Irradiation cystitis without hematuria: Secondary | ICD-10-CM | POA: Diagnosis not present

## 2018-09-21 DIAGNOSIS — C61 Malignant neoplasm of prostate: Secondary | ICD-10-CM | POA: Diagnosis not present

## 2018-09-21 NOTE — Therapy (Signed)
[facility], [place], [address] Phone: [phone]   Fax:  [phone]  Physical Therapy Treatment  Patient Details  Name: Mark Cannon MRN: [id number] Date of Birth: 07-15-1951 Referring Provider (PT): [name], MD   Encounter Date: 09/21/2018  PT End of Session - 09/21/18 1515    Visit Number  3    Number of Visits  7    Date for PT Re-Evaluation  10/07/18    Authorization Type  Medicare (Secondary: Generic [employer])    Authorization Time Period  09/16/18 to 10/01/18    Authorization - Visit Number  3    Authorization - Number of Visits  10    PT Start Time  1435    PT Stop Time  1515    PT Time Calculation (min)  40 min    Activity Tolerance  Patient tolerated treatment well    Behavior During Therapy  [facility] for tasks assessed/performed       Past Medical History:  Diagnosis Date  . Arthritis   . [facility])    Prostate, has had prostate removed, has had radiation and still has cancer,  . Colon polyps   . Cough   . GERD (gastroesophageal reflux disease)    occasional   . H/O tooth extraction week of 03/15/2015   . History of kidney stones   . Hypercholesteremia   . Hypertension   . Lumbar stenosis with neurogenic claudication   . Pneumonia   . Pre-diabetes   . Renal disorder   . Sleep apnea    No cpap    Past Surgical History:  Procedure Laterality Date  . APPENDECTOMY    . BACK SURGERY    . CHOLECYSTECTOMY    . COLONOSCOPY  02/04/2012   Procedure: COLONOSCOPY;  Surgeon: Jamesetta So, MD;  Location: [facility];  Service: Gastroenterology;  Laterality: N/A;  . COLONOSCOPY W/ POLYPECTOMY    . HERNIA REPAIR     Bilateral inguinal hernia repair X 4  . hydrocelectomy    . left knee arthroscopy    . LITHOTRIPSY    . LYMPHADENECTOMY Bilateral 03/22/2015   Procedure: PELVIC LYMPHADENECTOMY;  Surgeon: Alexis Frock, MD;  Location: [facility];  Service: Urology;  Laterality: Bilateral;  . NM  MYOVIEW LTD  2011  . ROBOT ASSISTED LAPAROSCOPIC RADICAL PROSTATECTOMY N/A 03/22/2015   Procedure: ROBOTIC ASSISTED LAPAROSCOPIC RADICAL PROSTATECTOMY WITH INDOCYANINE GREEN DYE INJECTION;  Surgeon: Alexis Frock, MD;  Location: [facility];  Service: Urology;  Laterality: N/A;  . ROBOTIC ASSISTED LAPAROSCOPIC LYSIS OF ADHESION  03/22/2015   Procedure: ROBOTIC ASSISTED LAPAROSCOPIC LYSIS OF ADHESION;  Surgeon: Alexis Frock, MD;  Location: [facility];  Service: Urology;;  . [name]      There were no vitals filed for this visit.  Subjective Assessment - 09/21/18 1518    Subjective  Pt states he has more pain with standing can be walking or standing still.  Currently without pain.  states he just returned from the cancer center and everything looks good, no signs of cancer.                       [facility] Adult PT Treatment/Exercise - 09/21/18 0001      Exercises   Exercises  Knee/Hip;Lumbar      Lumbar Exercises: Stretches   Active Hamstring Stretch  3 reps;30 seconds    Active Hamstring Stretch Limitations  supine with towel  Lower Trunk Rotation  5 reps;10 seconds      Lumbar Exercises: Supine   Ab Set  15 reps    AB Set Limitations  3'' holds    Bent Knee Raise  20 reps;3 seconds    Bent Knee Raise Limitations  Alternating LEs      Lumbar Exercises: Sidelying   Clam  Right;Left;15 reps      Knee/Hip Exercises: Supine   Bridges  Both;20 reps               PT Short Term Goals - 09/18/18 1352      PT SHORT TERM GOAL #1   Title  Pt will be independent with HEP and perform consistently in order to reduce pain and improve strength.    Time  1    Period  Weeks    Status  On-going      PT SHORT TERM GOAL #2   Title  Pt will have improved bil HS length to 150deg in 90/90 position in order to reduce pain and improve overall functional mobility.    Time  1    Period  Weeks    Status  On-going      PT SHORT TERM GOAL #3   Title  Pt will be able to ambulate  734ft during 6MWT with 5/10 LLE pain with 1 standing break or less in order to demo improved tolerance to ambulation.    Time  2    Period  Weeks    Status  On-going        PT Long Term Goals - 09/18/18 1356      PT LONG TERM GOAL #1   Title  Pt will have improved proximal hip strength to 4+/5 or better in order to reduce LBP and maximize his functional mobility.    Time  3    Period  Weeks    Status  On-going      PT LONG TERM GOAL #2   Title  Pt will report being able to stand for 30 mins in order to allow him to prepare a meal for his family with greater ease.     Time  3    Period  Weeks    Status  On-going      PT LONG TERM GOAL #3   Title  Pt will be able to ambulate 922ft during 6MWT with 3/10 LLE pain or better without requiring rest break in order to further demo improved tolerance to ambulation and maximize his community access.    Time  3    Period  Weeks    Status  On-going      PT LONG TERM GOAL #4   Title  Pt will score 14 or < on the Oswestry LBP Scale to demo reduced self-perceived funcitonal disability due ot his back pain.     Time  3    Period  Weeks    Status  On-going            Plan - 09/21/18 1516    Clinical Impression Statement  continued focus on established POC.  Added trunk rotations and hamstirng stretch with noted tigtness in ITB and hamstrings bilaterally.  pt requires cues for proper breathing and form of exercises.  Pt tends to over-do exercises, loosing track of counti requiring cues from therapist.     Rehab Potential  Fair    PT Frequency  2x / week    PT Duration  3 weeks  PT Treatment/Interventions  ADLs/Self Care Home Management;Aquatic Therapy;Cryotherapy;Electrical Stimulation;Moist Heat;Ultrasound;Gait training;Stair training;Functional mobility training;Therapeutic activities;Therapeutic exercise;Balance training;Neuromuscular re-education;Patient/family education;Manual techniques;Scar mobilization;Passive range of motion;Dry  needling;Taping    PT Next Visit Plan  Continue core, BLE, functional strengthening, hip, lumbar, and thoracic mobility, progressing as able    PT Home Exercise Plan  eval: supine 90/90 HS stretch, bridging    Consulted and Agree with Plan of Care  Patient       Patient will benefit from skilled therapeutic intervention in order to improve the following deficits and impairments:  Abnormal gait, Decreased activity tolerance, Decreased balance, Decreased endurance, Decreased mobility, Decreased range of motion, Decreased strength, Difficulty walking, Hypomobility, Increased fascial restricitons, Increased muscle spasms, Impaired flexibility, Improper body mechanics, Pain  Visit Diagnosis: Chronic bilateral low back pain with left-sided sciatica  Difficulty in walking, not elsewhere classified  Muscle weakness (generalized)     Problem List Patient Active Problem List   Diagnosis Date Noted  . Lumbar stenosis with neurogenic claudication 06/01/2018  . Prostate cancer ([place]) 03/22/2015   Teena Irani, PTA/CLT [id number]  Teena Irani 09/21/2018, 3:20 PM  [facility] [address] [place], [place], [address] Phone: [phone]   Fax:  [phone]  Name: Mark Cannon MRN: [id number] Date of Birth: July 05, 1951

## 2018-09-22 ENCOUNTER — Ambulatory Visit (HOSPITAL_COMMUNITY)
Admission: RE | Admit: 2018-09-22 | Discharge: 2018-09-22 | Disposition: A | Discharge status: Home / Self Care | Payer: Medicare Other | Source: Ambulatory Visit | Attending: General Surgery | Admitting: General Surgery

## 2018-09-22 ENCOUNTER — Encounter (HOSPITAL_COMMUNITY): Payer: Medicare Other

## 2018-09-22 ENCOUNTER — Other Ambulatory Visit: Payer: Self-pay

## 2018-09-22 ENCOUNTER — Encounter (HOSPITAL_COMMUNITY)
Admission: RE | Disposition: A | Discharge status: Home / Self Care | Payer: Self-pay | Source: Ambulatory Visit | Attending: General Surgery

## 2018-09-22 ENCOUNTER — Encounter (HOSPITAL_COMMUNITY): Payer: Self-pay | Admitting: *Deleted

## 2018-09-22 DIAGNOSIS — Z860100 Personal history of colon polyps, unspecified: Secondary | ICD-10-CM

## 2018-09-22 DIAGNOSIS — D125 Benign neoplasm of sigmoid colon: Secondary | ICD-10-CM | POA: Diagnosis not present

## 2018-09-22 DIAGNOSIS — G4730 Sleep apnea, unspecified: Secondary | ICD-10-CM | POA: Insufficient documentation

## 2018-09-22 DIAGNOSIS — K219 Gastro-esophageal reflux disease without esophagitis: Secondary | ICD-10-CM | POA: Insufficient documentation

## 2018-09-22 DIAGNOSIS — D123 Benign neoplasm of transverse colon: Secondary | ICD-10-CM

## 2018-09-22 DIAGNOSIS — D124 Benign neoplasm of descending colon: Secondary | ICD-10-CM | POA: Diagnosis not present

## 2018-09-22 DIAGNOSIS — C61 Malignant neoplasm of prostate: Secondary | ICD-10-CM | POA: Insufficient documentation

## 2018-09-22 DIAGNOSIS — F17210 Nicotine dependence, cigarettes, uncomplicated: Secondary | ICD-10-CM | POA: Insufficient documentation

## 2018-09-22 DIAGNOSIS — Z923 Personal history of irradiation: Secondary | ICD-10-CM | POA: Diagnosis not present

## 2018-09-22 DIAGNOSIS — I10 Essential (primary) hypertension: Secondary | ICD-10-CM | POA: Diagnosis not present

## 2018-09-22 DIAGNOSIS — K635 Polyp of colon: Secondary | ICD-10-CM | POA: Diagnosis not present

## 2018-09-22 DIAGNOSIS — Z79899 Other long term (current) drug therapy: Secondary | ICD-10-CM | POA: Diagnosis not present

## 2018-09-22 DIAGNOSIS — D122 Benign neoplasm of ascending colon: Secondary | ICD-10-CM | POA: Insufficient documentation

## 2018-09-22 DIAGNOSIS — K5730 Diverticulosis of large intestine without perforation or abscess without bleeding: Secondary | ICD-10-CM

## 2018-09-22 DIAGNOSIS — K6289 Other specified diseases of anus and rectum: Secondary | ICD-10-CM | POA: Diagnosis not present

## 2018-09-22 DIAGNOSIS — Z791 Long term (current) use of non-steroidal anti-inflammatories (NSAID): Secondary | ICD-10-CM | POA: Diagnosis not present

## 2018-09-22 HISTORY — PX: COLONOSCOPY: SHX5424

## 2018-09-22 HISTORY — PX: POLYPECTOMY: SHX5525

## 2018-09-22 SURGERY — COLONOSCOPY
Anesthesia: Moderate Sedation

## 2018-09-22 MED ORDER — MIDAZOLAM HCL 5 MG/5ML IJ SOLN
INTRAMUSCULAR | Status: DC | PRN
  Administered 2018-09-22: 1 mg via INTRAVENOUS
  Administered 2018-09-22: 4 mg via INTRAVENOUS

## 2018-09-22 MED ORDER — MIDAZOLAM HCL 5 MG/5ML IJ SOLN
INTRAMUSCULAR | Status: AC
  Filled 2018-09-22: qty 5

## 2018-09-22 MED ORDER — MEPERIDINE HCL 100 MG/ML IJ SOLN
INTRAMUSCULAR | Status: AC
  Filled 2018-09-22: qty 1

## 2018-09-22 MED ORDER — SODIUM CHLORIDE 0.9 % IV SOLN
INTRAVENOUS | Status: DC
  Administered 2018-09-22: 08:00:00 via INTRAVENOUS

## 2018-09-22 MED ORDER — MEPERIDINE HCL 50 MG/ML IJ SOLN
INTRAMUSCULAR | Status: DC | PRN
  Administered 2018-09-22: 25 mg
  Administered 2018-09-22: 50 mg

## 2018-09-22 NOTE — Interval H&P Note (Signed)
History and Physical Interval Note:  09/22/2018 7:57 AM  Mark Cannon  has presented today for surgery, with the diagnosis of blood in rectum  The various methods of treatment have been discussed with the patient and family. After consideration of risks, benefits and other options for treatment, the patient has consented to  Procedure(s): COLONOSCOPY (N/A) as a surgical intervention .  The patient's history has been reviewed, patient examined, no change in status, stable for surgery.  I have reviewed the patient's chart and labs.  Questions were answered to the patient's satisfaction.     Aviva Signs

## 2018-09-22 NOTE — Discharge Instructions (Signed)
Colonoscopy, Adult, Care After This sheet gives you information about how to care for yourself after your procedure. Your health care provider may also give you more specific instructions. If you have problems or questions, contact your health care provider. What can I expect after the procedure? After the procedure, it is common to have:  A small amount of blood in your stool for 24 hours after the procedure.  Some gas.  Mild abdominal cramping or bloating.  Follow these instructions at home: General instructions   For the first 24 hours after the procedure: ? Do not drive or use machinery. ? Do not sign important documents. ? Do not drink alcohol. ? Do your regular daily activities at a slower pace than normal. ? Eat soft, easy-to-digest foods. ? Rest often.  Take over-the-counter or prescription medicines only as told by your health care provider.  It is up to you to get the results of your procedure. Ask your health care provider, or the department performing the procedure, when your results will be ready. Relieving cramping and bloating  Try walking around when you have cramps or feel bloated.  Apply heat to your abdomen as told by your health care provider. Use a heat source that your health care provider recommends, such as a moist heat pack or a heating pad. ? Place a towel between your skin and the heat source. ? Leave the heat on for 20-30 minutes. ? Remove the heat if your skin turns bright red. This is especially important if you are unable to feel pain, heat, or cold. You may have a greater risk of getting burned. Eating and drinking  Drink enough fluid to keep your urine clear or pale yellow.  Resume your normal diet as instructed by your health care provider. Avoid heavy or fried foods that are hard to digest.  Avoid drinking alcohol for as long as instructed by your health care provider. Contact a health care provider if:  You have blood in your stool 2-3  days after the procedure. Get help right away if:  You have more than a small spotting of blood in your stool.  You pass large blood clots in your stool.  Your abdomen is swollen.  You have nausea or vomiting.  You have a fever.  You have increasing abdominal pain that is not relieved with medicine. This information is not intended to replace advice given to you by your health care provider. Make sure you discuss any questions you have with your health care provider. Document Released: 05/14/2004 Document Revised: 06/24/2016 Document Reviewed: 12/12/2015 Elsevier Interactive Patient Education  2018 [place] POST-ANESTHESIA  IMMEDIATELY FOLLOWING SURGERY:  Do not drive or operate machinery for the first twenty four hours after surgery.  Do not make any important decisions for twenty four hours after surgery or while taking narcotic pain medications or sedatives.  If you develop intractable nausea and vomiting or a severe headache please notify your doctor immediately.  FOLLOW-UP:  Please make an appointment with your surgeon as instructed. You do not need to follow up with anesthesia unless specifically instructed to do so.  WOUND CARE INSTRUCTIONS (if applicable):  Keep a dry clean dressing on the anesthesia/puncture wound site if there is drainage.  Once the wound has quit draining you may leave it open to air.  Generally you should leave the bandage intact for twenty four hours unless there is drainage.  If the epidural site drains for more than 36-48 hours  please call the anesthesia department.  QUESTIONS?:  Please feel free to call your physician or the hospital operator if you have any questions, and they will be happy to assist you.      Diverticulosis Diverticulosis is a condition that develops when small pouches (diverticula) form in the wall of the large intestine (colon). The colon is where water is absorbed and stool is formed. The pouches form  when the inside layer of the colon pushes through weak spots in the outer layers of the colon. You may have a few pouches or many of them. What are the causes? The cause of this condition is not known. What increases the risk? The following factors may make you more likely to develop this condition:  Being older than age 69. Your risk for this condition increases with age. Diverticulosis is rare among people younger than age 34. By age 47, many people have it.  Eating a low-fiber diet.  Having frequent constipation.  Being overweight.  Not getting enough exercise.  Smoking.  Taking over-the-counter pain medicines, like aspirin and ibuprofen.  Having a family history of diverticulosis.  What are the signs or symptoms? In most people, there are no symptoms of this condition. If you do have symptoms, they may include:  Bloating.  Cramps in the abdomen.  Constipation or diarrhea.  Pain in the lower left side of the abdomen.  How is this diagnosed? This condition is most often diagnosed during an exam for other colon problems. Because diverticulosis usually has no symptoms, it often cannot be diagnosed independently. This condition may be diagnosed by:  Using a flexible scope to examine the colon (colonoscopy).  Taking an X-ray of the colon after dye has been put into the colon (barium enema).  Doing a CT scan.  How is this treated? You may not need treatment for this condition if you have never developed an infection related to diverticulosis. If you have had an infection before, treatment may include:  Eating a high-fiber diet. This may include eating more fruits, vegetables, and grains.  Taking a fiber supplement.  Taking a live bacteria supplement (probiotic).  Taking medicine to relax your colon.  Taking antibiotic medicines.  Follow these instructions at home:  Drink 6-8 glasses of water or more each day to prevent constipation.  Try not to strain when you  have a bowel movement.  If you have had an infection before: ? Eat more fiber as directed by your health care provider or your diet and nutrition specialist (dietitian). ? Take a fiber supplement or probiotic, if your health care provider approves.  Take over-the-counter and prescription medicines only as told by your health care provider.  If you were prescribed an antibiotic, take it as told by your health care provider. Do not stop taking the antibiotic even if you start to feel better.  Keep all follow-up visits as told by your health care provider. This is important. Contact a health care provider if:  You have pain in your abdomen.  You have bloating.  You have cramps.  You have not had a bowel movement in 3 days. Get help right away if:  Your pain gets worse.  Your bloating becomes very bad.  You have a fever or chills, and your symptoms suddenly get worse.  You vomit.  You have bowel movements that are bloody or black.  You have bleeding from your rectum. Summary  Diverticulosis is a condition that develops when small pouches (diverticula) form in  the wall of the large intestine (colon).  You may have a few pouches or many of them.  This condition is most often diagnosed during an exam for other colon problems.  If you have had an infection related to diverticulosis, treatment may include increasing the fiber in your diet, taking supplements, or taking medicines. This information is not intended to replace advice given to you by your health care provider. Make sure you discuss any questions you have with your health care provider. Document Released: 06/27/2004 Document Revised: 08/19/2016 Document Reviewed: 08/19/2016 Elsevier Interactive Patient Education  2017 [place].     Colon Polyps Polyps are tissue growths inside the body. Polyps can grow in many places, including the large intestine (colon). A polyp may be a round bump or a mushroom-shaped  growth. You could have one polyp or several. Most colon polyps are noncancerous (benign). However, some colon polyps can become cancerous over time. What are the causes? The exact cause of colon polyps is not known. What increases the risk? This condition is more likely to develop in people who:  Have a family history of colon cancer or colon polyps.  Are older than 20 or older than 45 if they are African American.  Have inflammatory bowel disease, such as ulcerative colitis or Crohn disease.  Are overweight.  Smoke cigarettes.  Do not get enough exercise.  Drink too much alcohol.  Eat a diet that is: ? High in fat and red meat. ? Low in fiber.  Had childhood cancer that was treated with abdominal radiation.  What are the signs or symptoms? Most polyps do not cause symptoms. If you have symptoms, they may include:  Blood coming from your rectum when having a bowel movement.  Blood in your stool.The stool may look dark red or black.  A change in bowel habits, such as constipation or diarrhea.  How is this diagnosed? This condition is diagnosed with a colonoscopy. This is a procedure that uses a lighted, flexible scope to look at the inside of your colon. How is this treated? Treatment for this condition involves removing any polyps that are found. Those polyps will then be tested for cancer. If cancer is found, your health care provider will talk to you about options for colon cancer treatment. Follow these instructions at home: Diet  Eat plenty of fiber, such as fruits, vegetables, and whole grains.  Eat foods that are high in calcium and vitamin D, such as milk, cheese, yogurt, eggs, liver, fish, and broccoli.  Limit foods high in fat, red meats, and processed meats, such as hot dogs, sausage, bacon, and lunch meats.  Maintain a healthy weight, or lose weight if recommended by your health care provider. General instructions  Do not smoke cigarettes.  Do not  drink alcohol excessively.  Keep all follow-up visits as told by your health care provider. This is important. This includes keeping regularly scheduled colonoscopies. Talk to your health care provider about when you need a colonoscopy.  Exercise every day or as told by your health care provider. Contact a health care provider if:  You have new or worsening bleeding during a bowel movement.  You have new or increased blood in your stool.  You have a change in bowel habits.  You unexpectedly lose weight. This information is not intended to replace advice given to you by your health care provider. Make sure you discuss any questions you have with your health care provider. Document Released: 06/26/2004 Document Revised:  03/07/2016 Document Reviewed: 08/21/2015 Elsevier Interactive Patient Education  [employer].

## 2018-09-22 NOTE — Op Note (Signed)
[facility] Patient Name: Mark Cannon Procedure Date: 09/22/2018 7:56 AM MRN: [id number] Date of Birth: April 30, 1951 Attending MD: Aviva Signs , MD CSN: [id number] Age: 67 Admit Type: Outpatient Procedure:                Colonoscopy Indications:              High risk colon cancer surveillance: Personal                            history of colonic polyps Providers:                Aviva Signs, MD, Janeece Riggers, RN, Randa Spike,                            Technician Referring MD:              Medicines:                Midazolam 5 mg IV, Meperidine 75 mg IV Complications:            No immediate complications. Estimated Blood Loss:     Estimated blood loss: none. Procedure:                Pre-Anesthesia Assessment:                           - Prior to the procedure, a History and Physical                            was performed, and patient medications and                            allergies were reviewed. The patient is competent.                            The risks and benefits of the procedure and the                            sedation options and risks were discussed with the                            patient. All questions were answered and informed                            consent was obtained. Patient identification and                            proposed procedure were verified by the physician,                            the nurse and the technician in the endoscopy                            suite. Mental Status Examination: alert and  oriented. Airway Examination: normal oropharyngeal                            airway and neck mobility. Respiratory Examination:                            clear to auscultation. CV Examination: RRR, no                            murmurs, no S3 or S4. Prophylactic Antibiotics: The                            patient does not require prophylactic antibiotics.                            Prior  Anticoagulants: The patient has taken no                            previous anticoagulant or antiplatelet agents. ASA                            Grade Assessment: II - A patient with mild systemic                            disease. After reviewing the risks and benefits,                            the patient was deemed in satisfactory condition to                            undergo the procedure. The anesthesia plan was to                            use moderate sedation / analgesia (conscious                            sedation). Immediately prior to administration of                            medications, the patient was re-assessed for                            adequacy to receive sedatives. The heart rate,                            respiratory rate, oxygen saturations, blood                            pressure, adequacy of pulmonary ventilation, and                            response to care were monitored throughout the  procedure. The physical status of the patient was                            re-assessed after the procedure.                           After obtaining informed consent, the colonoscope                            was passed under direct vision. Throughout the                            procedure, the patient's blood pressure, pulse, and                            oxygen saturations were monitored continuously. The                            CF-[id number] ([id number]) scope was introduced through                            the anus and advanced to the the cecum, identified                            by the appendiceal orifice, ileocecal valve and                            palpation. No anatomical landmarks were                            photographed. The entire colon was well visualized.                            The colonoscopy was performed without difficulty.                            The patient tolerated the procedure well. The                             quality of the bowel preparation was adequate. The                            total duration of the procedure was 23 minutes. Scope In: 8:05:33 AM Scope Out: 8:27:46 AM Scope Withdrawal Time: 0 hours 13 minutes 51 seconds  Total Procedure Duration: 0 hours 22 minutes 13 seconds  Findings:      The perianal and digital rectal examinations were normal.      A 4 mm polyp was found in the distal ascending colon. The polyp was       sessile. The polyp was removed with a hot snare. Resection and retrieval       were complete. Estimated blood loss: none.      A 2 mm polyp was found in the proximal transverse colon. The polyp was       sessile. The polyp was removed  with a hot snare. Resection was complete,       and retrieval was complete. Estimated blood loss: none.      A 5 mm polyp was found in the mid sigmoid colon. The polyp was sessile.       The polyp was removed with a hot snare. Resection and retrieval were       complete. Estimated blood loss: none.      Multiple large-mouthed diverticula were found in the sigmoid colon.      The exam was otherwise without abnormality on direct and retroflexion       views. Impression:               - One 4 mm polyp in the distal ascending colon,                            removed with a hot snare. Resected and retrieved.                           - One 2 mm polyp in the proximal transverse colon,                            removed with a hot snare. Resected and retrieved.                           - One 5 mm polyp in the mid sigmoid colon, removed                            with a hot snare. Resected and retrieved.                           - Diverticulosis in the sigmoid colon.                           - The examination was otherwise normal on direct                            and retroflexion views. Moderate Sedation:      Moderate (conscious) sedation was administered by the endoscopy nurse       and supervised by the  endoscopist. The following parameters were       monitored: oxygen saturation, heart rate, blood pressure, and response       to care. Recommendation:           - Written discharge instructions were provided to                            the patient.                           - The signs and symptoms of potential delayed                            complications were discussed with the patient.                           -  Patient has a contact number available for                            emergencies.                           - Return to normal activities tomorrow.                           - Resume previous diet.                           - Continue present medications.                           - Await pathology results.                           - Repeat colonoscopy in 5 years for surveillance                            based on pathology results. Procedure Code(s):        --- Professional ---                           [phone], Colonoscopy, flexible; with removal of                            tumor(s), polyp(s), or other lesion(s) by snare                            technique Diagnosis Code(s):        --- Professional ---                           Z86.010, Personal history of colonic polyps                           D12.2, Benign neoplasm of ascending colon                           D12.3, Benign neoplasm of transverse colon (hepatic                            flexure or splenic flexure)                           D12.5, Benign neoplasm of sigmoid colon                           K57.30, Diverticulosis of large intestine without                            perforation or abscess without bleeding CPT copyright 2018 American [employer] Association. All rights reserved. The codes documented in this report are preliminary and upon coder review may  be revised to meet current compliance requirements. Aviva Signs, MD Aviva Signs, MD  09/22/2018 8:38:15 AM This report has been signed  electronically. Number of Addenda: 0

## 2018-09-24 ENCOUNTER — Encounter (HOSPITAL_COMMUNITY): Payer: Self-pay

## 2018-09-24 ENCOUNTER — Ambulatory Visit (HOSPITAL_COMMUNITY): Payer: Medicare Other

## 2018-09-24 DIAGNOSIS — R262 Difficulty in walking, not elsewhere classified: Secondary | ICD-10-CM

## 2018-09-24 DIAGNOSIS — M5442 Lumbago with sciatica, left side: Secondary | ICD-10-CM | POA: Diagnosis not present

## 2018-09-24 DIAGNOSIS — G8929 Other chronic pain: Secondary | ICD-10-CM

## 2018-09-24 DIAGNOSIS — M6281 Muscle weakness (generalized): Secondary | ICD-10-CM | POA: Diagnosis not present

## 2018-09-24 NOTE — Therapy (Signed)
[place], [place], [address] Phone: [phone]   Fax:  [phone]  Physical Therapy Treatment  Patient Details  Name: Mark Cannon MRN: [id number] Date of Birth: [birth year] Referring Provider (PT): [name], MD   Encounter Date: 09/24/2018  PT End of Session - 09/24/18 1033    Visit Number  4    Number of Visits  7    Date for PT Re-Evaluation  10/07/18    Authorization Type  Medicare (Secondary: Generic [employer])    Authorization Time Period  09/16/18 to 10/07/18    Authorization - Visit Number  4    Authorization - Number of Visits  10    PT Start Time  1028    PT Stop Time  1115    PT Time Calculation (min)  47 min    Activity Tolerance  Patient tolerated treatment well    Behavior During Therapy  [facility] for tasks assessed/performed       Past Medical History:  Diagnosis Date  . Arthritis   . Cancer [facility])    Prostate, has had prostate removed, has had radiation and still has cancer,  . Colon polyps   . Cough   . GERD (gastroesophageal reflux disease)    occasional   . H/O tooth extraction week of 03/15/2015   . History of kidney stones   . Hypercholesteremia   . Hypertension   . Lumbar stenosis with neurogenic claudication   . Pneumonia   . Pre-diabetes   . Renal disorder   . Sleep apnea    No cpap    Past Surgical History:  Procedure Laterality Date  . APPENDECTOMY    . BACK SURGERY    . CHOLECYSTECTOMY    . COLONOSCOPY  02/04/2012   Procedure: COLONOSCOPY;  Surgeon: Jamesetta So, MD;  [facility];  Service: Gastroenterology;  Laterality: N/A;  . COLONOSCOPY W/ POLYPECTOMY    . HERNIA REPAIR     Bilateral inguinal hernia repair X 4  . hydrocelectomy    . left knee arthroscopy    . LITHOTRIPSY    . LYMPHADENECTOMY Bilateral 03/22/2015   Procedure: PELVIC LYMPHADENECTOMY;  Surgeon: Alexis Frock, MD;  Location: [facility];  Service: Urology;  Laterality: Bilateral;  . NM  MYOVIEW LTD  2011  . ROBOT ASSISTED LAPAROSCOPIC RADICAL PROSTATECTOMY N/A 03/22/2015   Procedure: ROBOTIC ASSISTED LAPAROSCOPIC RADICAL PROSTATECTOMY WITH INDOCYANINE GREEN DYE INJECTION;  Surgeon: Alexis Frock, MD;  Location: [facility];  Service: Urology;  Laterality: N/A;  . ROBOTIC ASSISTED LAPAROSCOPIC LYSIS OF ADHESION  03/22/2015   Procedure: ROBOTIC ASSISTED LAPAROSCOPIC LYSIS OF ADHESION;  Surgeon: Alexis Frock, MD;  Location[facility];  Service: Urology;;  . [name]      There were no vitals filed for this visit.  Subjective Assessment - 09/24/18 1025    Subjective  Pt stated he is feeling good today, no reports of pain unless he stands for greater than 5-10 minutes    Pertinent History  current prostate CA    Patient Stated Goals  get better movement and stand longer than 10 mins    Currently in Pain?  No/denies                       [facility] Adult PT Treatment/Exercise - 09/24/18 0001      Lumbar Exercises: Stretches   Active Hamstring Stretch  3 reps;30 seconds    Active Hamstring Stretch Limitations  supine with towel    Lower Trunk Rotation  5 reps;10 seconds      Lumbar Exercises: Standing   Other Standing Lumbar Exercises  weight shifting    Other Standing Lumbar Exercises  hip abduction 5x BLE      Lumbar Exercises: Seated   Sit to Stand  10 reps    Sit to Stand Limitations  no UE, eccentric control    Other Seated Lumbar Exercises  3D thoracic excursion without UE movements 5x      Lumbar Exercises: Supine   Ab Set  15 reps    AB Set Limitations  3'' holds    Bent Knee Raise  20 reps;3 seconds    Bent Knee Raise Limitations  Alternating LEs with cueing for ab set and breathing    Bridge  10 reps;3 seconds   2 sets     Lumbar Exercises: Sidelying   Clam  Right;Left;15 reps    Clam Limitations  RTB 3" holds               PT Short Term Goals - 09/18/18 1352      PT SHORT TERM GOAL #1   Title  Pt will be independent with HEP and  perform consistently in order to reduce pain and improve strength.    Time  1    Period  Weeks    Status  On-going      PT SHORT TERM GOAL #2   Title  Pt will have improved bil HS length to 150deg in 90/90 position in order to reduce pain and improve overall functional mobility.    Time  1    Period  Weeks    Status  On-going      PT SHORT TERM GOAL #3   Title  Pt will be able to ambulate 773ft during 6MWT with 5/10 LLE pain with 1 standing break or less in order to demo improved tolerance to ambulation.    Time  2    Period  Weeks    Status  On-going        PT Long Term Goals - 09/18/18 1356      PT LONG TERM GOAL #1   Title  Pt will have improved proximal hip strength to 4+/5 or better in order to reduce LBP and maximize his functional mobility.    Time  3    Period  Weeks    Status  On-going      PT LONG TERM GOAL #2   Title  Pt will report being able to stand for 30 mins in order to allow him to prepare a meal for his family with greater ease.     Time  3    Period  Weeks    Status  On-going      PT LONG TERM GOAL #3   Title  Pt will be able to ambulate 938ft during 6MWT with 3/10 LLE pain or better without requiring rest break in order to further demo improved tolerance to ambulation and maximize his community access.    Time  3    Period  Weeks    Status  On-going      PT LONG TERM GOAL #4   Title  Pt will score 14 or < on the Oswestry LBP Scale to demo reduced self-perceived funcitonal disability due ot his back pain.     Time  3    Period  Weeks    Status  On-going  Plan - 09/24/18 1208    Clinical Impression Statement  Continued session focus with established POC.  Continued wiht LTR and hamstring stretches for mobility.  Added thoracic excursions wihtout UE movements to improve mobility as well.  Progressed to standing weight shifting and hip abduction for strengthening.  Pt tolerated well but stated he could feel the pain arriving with short  standing exercises today.  Improved breathing, does continue to required cueing for form with exercises.      Rehab Potential  Fair    PT Frequency  2x / week    PT Duration  3 weeks    PT Treatment/Interventions  ADLs/Self Care Home Management;Aquatic Therapy;Cryotherapy;Electrical Stimulation;Moist Heat;Ultrasound;Gait training;Stair training;Functional mobility training;Therapeutic activities;Therapeutic exercise;Balance training;Neuromuscular re-education;Patient/family education;Manual techniques;Scar mobilization;Passive range of motion;Dry needling;Taping    PT Next Visit Plan  Continue core, BLE, functional strengthening, hip, lumbar, and thoracic mobility, progressing as able    PT Home Exercise Plan  eval: supine 90/90 HS stretch, bridging       Patient will benefit from skilled therapeutic intervention in order to improve the following deficits and impairments:  Abnormal gait, Decreased activity tolerance, Decreased balance, Decreased endurance, Decreased mobility, Decreased range of motion, Decreased strength, Difficulty walking, Hypomobility, Increased fascial restricitons, Increased muscle spasms, Impaired flexibility, Improper body mechanics, Pain  Visit Diagnosis: Chronic bilateral low back pain with left-sided sciatica  Difficulty in walking, not elsewhere classified  Muscle weakness (generalized)     Problem List Patient Active Problem List   Diagnosis Date Noted  . Personal history of colonic polyps   . Benign neoplasm of ascending colon   . Benign neoplasm of transverse colon   . Polyp of sigmoid colon   . Diverticulosis of large intestine without diverticulitis   . Lumbar stenosis with neurogenic claudication 06/01/2018  . Prostate cancer [facility]) 03/22/2015   Ihor Austin, [place]; [place]  Aldona Lento 09/24/2018, 12:11 PM  [place], [place], [address] Phone: [phone]    Fax:  [phone]  Name: Mark Cannon MRN: [id number] Date of Birth: 1951/03/13

## 2018-09-24 NOTE — Progress Notes
I saw and evaluated  Maurice Ramirez.  I discussed the case with the fellow and agree with the findings and plan of care as documented in the resident's note.       Vinod Mikesell R. Delmo Matty

## 2018-09-28 ENCOUNTER — Encounter (HOSPITAL_COMMUNITY): Payer: Self-pay | Admitting: General Surgery

## 2018-09-29 ENCOUNTER — Encounter (HOSPITAL_COMMUNITY): Payer: Self-pay

## 2018-09-29 ENCOUNTER — Ambulatory Visit (HOSPITAL_COMMUNITY): Payer: Medicare Other

## 2018-09-29 DIAGNOSIS — R262 Difficulty in walking, not elsewhere classified: Secondary | ICD-10-CM

## 2018-09-29 DIAGNOSIS — M5442 Lumbago with sciatica, left side: Secondary | ICD-10-CM | POA: Diagnosis not present

## 2018-09-29 DIAGNOSIS — M6281 Muscle weakness (generalized): Secondary | ICD-10-CM | POA: Diagnosis not present

## 2018-09-29 DIAGNOSIS — G8929 Other chronic pain: Secondary | ICD-10-CM

## 2018-09-29 NOTE — Therapy (Signed)
[place], [place], [address] Phone: [phone]   Fax:  [phone]  Physical Therapy Treatment  Patient Details  Name: Mark Cannon MRN: [id number] Date of Birth: 11/29/1950 Referring Provider (PT): [name], MD   Encounter Date: 09/29/2018  PT End of Session - 09/29/18 1119    Visit Number  5    Number of Visits  7    Date for PT Re-Evaluation  10/07/18    Authorization Type  Medicare (Secondary: Generic [employer])    Authorization Time Period  09/16/18 to 10/07/18    Authorization - Visit Number  5    Authorization - Number of Visits  10    PT Start Time  1118    PT Stop Time  1158    PT Time Calculation (min)  40 min    Activity Tolerance  Patient tolerated treatment well    Behavior During Therapy  [facility] for tasks assessed/performed       Past Medical History:  Diagnosis Date  . Arthritis   . Cancer [facility])    Prostate, has had prostate removed, has had radiation and still has cancer,  . Colon polyps   . Cough   . GERD (gastroesophageal reflux disease)    occasional   . H/O tooth extraction week of 03/15/2015   . History of kidney stones   . Hypercholesteremia   . Hypertension   . Lumbar stenosis with neurogenic claudication   . Pneumonia   . Pre-diabetes   . Renal disorder   . Sleep apnea    No cpap    Past Surgical History:  Procedure Laterality Date  . APPENDECTOMY    . BACK SURGERY    . CHOLECYSTECTOMY    . COLONOSCOPY  02/04/2012   Procedure: COLONOSCOPY;  Surgeon: Jamesetta So, MD;  [facility];  Service: Gastroenterology;  Laterality: N/A;  . COLONOSCOPY N/A 09/22/2018   Procedure: COLONOSCOPY;  Surgeon: Aviva Signs, MD;  [facility];  Service: Gastroenterology;  Laterality: N/A;  . COLONOSCOPY W/ POLYPECTOMY    . HERNIA REPAIR     Bilateral inguinal hernia repair X 4  . hydrocelectomy    . left knee arthroscopy    . LITHOTRIPSY    .  LYMPHADENECTOMY Bilateral 03/22/2015   Procedure: PELVIC LYMPHADENECTOMY;  Surgeon: Alexis Frock, MD;  [facility];  Service: Urology;  Laterality: Bilateral;  . NM MYOVIEW LTD  2011  . POLYPECTOMY  09/22/2018   Procedure: POLYPECTOMY;  Surgeon: Aviva Signs, MD;  [facility];  Service: Gastroenterology;;  . ROBOT ASSISTED LAPAROSCOPIC RADICAL PROSTATECTOMY N/A 03/22/2015   Procedure: ROBOTIC ASSISTED LAPAROSCOPIC RADICAL PROSTATECTOMY WITH INDOCYANINE GREEN DYE INJECTION;  Surgeon: Alexis Frock, MD;  [facility];  Service: Urology;  Laterality: N/A;  . ROBOTIC ASSISTED LAPAROSCOPIC LYSIS OF ADHESION  03/22/2015   Procedure: ROBOTIC ASSISTED LAPAROSCOPIC LYSIS OF ADHESION;  Surgeon: Alexis Frock, MD;  [facility];  Service: Urology;;  . [name]      There were no vitals filed for this visit.  Subjective Assessment - 09/29/18 1120    Subjective  Pt states that his back has not changed much, it's about the same. He is trying to walk more but it's a slow go on progress.     Pertinent History  current prostate CA    Patient Stated Goals  get better movement and stand longer than 10 mins    Currently in Pain?  No/denies           [facility] Adult PT Treatment/Exercise - 09/29/18 0001      Exercises   Exercises  Lumbar      Lumbar Exercises: Stretches   Other Lumbar Stretch Exercise  fwd, R/L lumbar stretch with large physioball 5x10" each      Lumbar Exercises: Standing   Functional Squats  10 reps    Functional Squats Limitations  min cues for form    Other Standing Lumbar Exercises  hip abd and diagonals RTB x10 reps each      Lumbar Exercises: Seated   Hip Flexion on Ball  Both;15 reps    Hip Flexion on Ball Limitations  on dyna disc    Sit to Stand  10 reps    Sit to Stand Limitations  no UE, eccentric control    Other Seated Lumbar Exercises  3D thoracic excursions without UE movements x10reps      Lumbar Exercises: Supine   Straight Leg Raise   10 reps    Straight Leg Raises Limitations  BLE, ab set +diaphragmatic breathing      Lumbar Exercises: Sidelying   Clam  Both;15 reps    Clam Limitations  RTB 3" holds           PT Education - 09/29/18 1119    Education Details  updated HEP    Person(s) Educated  Patient    Methods  Explanation;Demonstration;Handout    Comprehension  Verbalized understanding;Returned demonstration       PT Short Term Goals - 09/18/18 1352      PT SHORT TERM GOAL #1   Title  Pt will be independent with HEP and perform consistently in order to reduce pain and improve strength.    Time  1    Period  Weeks    Status  On-going      PT SHORT TERM GOAL #2   Title  Pt will have improved bil HS length to 150deg in 90/90 position in order to reduce pain and improve overall functional mobility.    Time  1    Period  Weeks    Status  On-going      PT SHORT TERM GOAL #3   Title  Pt will be able to ambulate 769ft during 6MWT with 5/10 LLE pain with 1 standing break or less in order to demo improved tolerance to ambulation.    Time  2    Period  Weeks    Status  On-going        PT Long Term Goals - 09/18/18 1356      PT LONG TERM GOAL #1   Title  Pt will have improved proximal hip strength to 4+/5 or better in order to reduce LBP and maximize his functional mobility.    Time  3    Period  Weeks    Status  On-going      PT LONG TERM GOAL #2   Title  Pt will report being able to stand for 30 mins in order to allow him to prepare a meal for his family with greater ease.     Time  3    Period  Weeks    Status  On-going      PT LONG TERM GOAL #3   Title  Pt will be able to ambulate 974ft during 6MWT with 3/10 LLE pain or better without requiring rest break in order to further demo improved tolerance to ambulation and  maximize his community access.    Time  3    Period  Weeks    Status  On-going      PT LONG TERM GOAL #4   Title  Pt will score 14 or < on the Oswestry LBP Scale to demo  reduced self-perceived funcitonal disability due ot his back pain.     Time  3    Period  Weeks    Status  On-going            Plan - 09/29/18 1200    Clinical Impression Statement  Continued with established POC focusing on hip and functional strength as well as overall mobility. Pt tolerated more upright standing this date. Updated HEP today. Progressed to core stabilization with pt seated on dyna disc. Pt generally unsteady and moderately challenged with this. Pt due for reassessment and likely d/c next visit due to pt preference    Rehab Potential  Fair    PT Frequency  2x / week    PT Duration  3 weeks    PT Treatment/Interventions  ADLs/Self Care Home Management;Aquatic Therapy;Cryotherapy;Electrical Stimulation;Moist Heat;Ultrasound;Gait training;Stair training;Functional mobility training;Therapeutic activities;Therapeutic exercise;Balance training;Neuromuscular re-education;Patient/family education;Manual techniques;Scar mobilization;Passive range of motion;Dry needling;Taping    PT Next Visit Plan  reassessment;  Continue core, BLE, functional strengthening, hip, lumbar, and thoracic mobility, progressing as able    PT Home Exercise Plan  eval: supine 90/90 HS stretch, bridging; 12/17: SLR, clams RTB, STS, 3D thoracic excursions    Consulted and Agree with Plan of Care  Patient       Patient will benefit from skilled therapeutic intervention in order to improve the following deficits and impairments:  Abnormal gait, Decreased activity tolerance, Decreased balance, Decreased endurance, Decreased mobility, Decreased range of motion, Decreased strength, Difficulty walking, Hypomobility, Increased fascial restricitons, Increased muscle spasms, Impaired flexibility, Improper body mechanics, Pain  Visit Diagnosis: Chronic bilateral low back pain with left-sided sciatica  Difficulty in walking, not elsewhere classified  Muscle weakness (generalized)     Problem List Patient  Active Problem List   Diagnosis Date Noted  . Personal history of colonic polyps   . Benign neoplasm of ascending colon   . Benign neoplasm of transverse colon   . Polyp of sigmoid colon   . Diverticulosis of large intestine without diverticulitis   . Lumbar stenosis with neurogenic claudication 06/01/2018  . Prostate cancer ([place]) 03/22/2015       Geraldine Solar PT, [place] [address] [place], [place], [address] Phone: [phone]   Fax:  [phone]  Name: VUONG MUSA MRN: [id number] Date of Birth: 14-Jun-1951

## 2018-10-01 ENCOUNTER — Encounter (HOSPITAL_COMMUNITY): Payer: Self-pay

## 2018-10-01 ENCOUNTER — Ambulatory Visit (HOSPITAL_COMMUNITY): Payer: Medicare Other

## 2018-10-01 DIAGNOSIS — G8929 Other chronic pain: Secondary | ICD-10-CM | POA: Diagnosis not present

## 2018-10-01 DIAGNOSIS — M6281 Muscle weakness (generalized): Secondary | ICD-10-CM

## 2018-10-01 DIAGNOSIS — M5442 Lumbago with sciatica, left side: Secondary | ICD-10-CM | POA: Diagnosis not present

## 2018-10-01 DIAGNOSIS — R262 Difficulty in walking, not elsewhere classified: Secondary | ICD-10-CM | POA: Diagnosis not present

## 2018-10-01 NOTE — Therapy (Signed)
[facility] [address] [place], [place], [address] Phone: [phone]   Fax:  [phone]   PHYSICAL THERAPY DISCHARGE SUMMARY  Visits from Start of Care: 6  Current functional level related to goals / functional outcomes: See below   Remaining deficits: See below   Education / Equipment: HEP  Plan: Patient agrees to discharge.  Patient goals were partially met. Patient is being discharged due to the patient's request.  ?????    Physical Therapy Treatment  Patient Details  Name: Mark Cannon MRN: [id number] Date of Birth: 1950/10/28 Referring Provider (PT): [name], MD   Encounter Date: 10/01/2018  PT End of Session - 10/01/18 1117    Visit Number  6    Number of Visits  7    Date for PT Re-Evaluation  10/07/18    Authorization Type  Medicare (Secondary: Generic [employer])    Authorization Time Period  09/16/18 to 10/07/18    Authorization - Visit Number  6    Authorization - Number of Visits  10    PT Start Time  1115    PT Stop Time  1141    PT Time Calculation (min)  26 min    Activity Tolerance  Patient tolerated treatment well    Behavior During Therapy  [facility] for tasks assessed/performed       Past Medical History:  Diagnosis Date  . Arthritis   . Cancer [facility])    Prostate, has had prostate removed, has had radiation and still has cancer,  . Colon polyps   . Cough   . GERD (gastroesophageal reflux disease)    occasional   . H/O tooth extraction week of 03/15/2015   . History of kidney stones   . Hypercholesteremia   . Hypertension   . Lumbar stenosis with neurogenic claudication   . Pneumonia   . Pre-diabetes   . Renal disorder   . Sleep apnea    No cpap    Past Surgical History:  Procedure Laterality Date  . APPENDECTOMY    . BACK SURGERY    . CHOLECYSTECTOMY    . COLONOSCOPY  02/04/2012   Procedure: COLONOSCOPY;  Surgeon: Jamesetta So, MD;  [facility];  Service:  Gastroenterology;  Laterality: N/A;  . COLONOSCOPY N/A 09/22/2018   Procedure: COLONOSCOPY;  Surgeon: Aviva Signs, MD;  Location: [facility];  Service: Gastroenterology;  Laterality: N/A;  . COLONOSCOPY W/ POLYPECTOMY    . HERNIA REPAIR     Bilateral inguinal hernia repair X 4  . hydrocelectomy    . left knee arthroscopy    . LITHOTRIPSY    . LYMPHADENECTOMY Bilateral 03/22/2015   Procedure: PELVIC LYMPHADENECTOMY;  Surgeon: Alexis Frock, MD;  Location: [facility];  Service: Urology;  Laterality: Bilateral;  . NM MYOVIEW LTD  2011  . POLYPECTOMY  09/22/2018   Procedure: POLYPECTOMY;  Surgeon: Aviva Signs, MD;  Location: [facility];  Service: Gastroenterology;;  . ROBOT ASSISTED LAPAROSCOPIC RADICAL PROSTATECTOMY N/A 03/22/2015   Procedure: ROBOTIC ASSISTED LAPAROSCOPIC RADICAL PROSTATECTOMY WITH INDOCYANINE GREEN DYE INJECTION;  Surgeon: Alexis Frock, MD;  Location: [facility];  Service: Urology;  Laterality: N/A;  . ROBOTIC ASSISTED LAPAROSCOPIC LYSIS OF ADHESION  03/22/2015   Procedure: ROBOTIC ASSISTED LAPAROSCOPIC LYSIS OF ADHESION;  Surgeon: Alexis Frock, MD;  Location: [facility];  Service: Urology;;  . [name]      There were no vitals filed for this visit.  Subjective Assessment - 10/01/18 1118  Subjective  Pt states he was tired yesterday from his therapy session. No pain at the moment.     Pertinent History  current prostate CA    Patient Stated Goals  get better movement and stand longer than 10 mins    Currently in Pain?  No/denies         [facility] PT Assessment - 10/01/18 0001      Assessment   Medical Diagnosis  Arthrodesis status -- Lumbar reconditioning and core strengthening - s/p L4-5 decompression    Referring Provider (PT)  [name], MD    Onset Date/Surgical Date  06/01/18    Next MD Visit  December 22, 2018    Prior Therapy  yes for R RTC      Observation/Other Assessments   Oswestry Disability Index   28/50   was 16/50     Strength   Right Hip  Extension  4+/5   was 4-   Right Hip ABduction  5/5   was 4+   Left Hip Extension  4+/5   was 4-   Left Hip ABduction  5/5   was 4     Flexibility   Soft Tissue Assessment /Muscle Length  yes    Hamstrings  R: 150deg L: 150deg   was 145deg R, 138deg L     Ambulation/Gait   Ambulation Distance (Feet)  678 Feet   6MWT; was 571f    Assistive device  None    Gait Comments  1 seated rest break after 3 mins and then used remianing 3 mins to rest             PT Short Term Goals - 10/01/18 1118      PT SHORT TERM GOAL #1   Title  Pt will be independent with HEP and perform consistently in order to reduce pain and improve strength.    Time  1    Period  Weeks    Status  Achieved      PT SHORT TERM GOAL #2   Title  Pt will have improved bil HS length to 150deg in 90/90 position in order to reduce pain and improve overall functional mobility.    Time  1    Period  Weeks    Status  Achieved      PT SHORT TERM GOAL #3   Title  Pt will be able to ambulate 7029fduring 6MWT with 5/10 LLE pain with 1 standing break or less in order to demo improved tolerance to ambulation.    Baseline  12/19: 67815f8/10 LLE pain, 1 seated rest break    Time  2    Period  Weeks    Status  On-going        PT Long Term Goals - 10/01/18 1119      PT LONG TERM GOAL #1   Title  Pt will have improved proximal hip strength to 4+/5 or better in order to reduce LBP and maximize his functional mobility.    Baseline  12/19: see MMT    Time  3    Period  Weeks    Status  Achieved      PT LONG TERM GOAL #2   Title  Pt will report being able to stand for 30 mins in order to allow him to prepare a meal for his family with greater ease.     Baseline  12/19:  6mi62m   Time  3    Period  Weeks    Status  On-going      PT LONG TERM GOAL #3   Title  Pt will be able to ambulate 917f during 6MWT with 3/10 LLE pain or better without requiring rest break in order to further demo improved tolerance to  ambulation and maximize his community access.    Baseline  12/19: 6710f 8/10 LLE pain, 1 seated rest break    Time  3    Period  Weeks    Status  On-going      PT LONG TERM GOAL #4   Title  Pt will score 14 or < on the Oswestry LBP Scale to demo reduced self-perceived funcitonal disability due ot his back pain.     Time  3    Period  Weeks    Status  On-going            Plan - 10/01/18 1147    Clinical Impression Statement  PT reassessed pt's goals and outcome measures this date. Pt has met his HS flexibility and MMT goal while all others are still on-going. He did have improved tolerance to amb during the 6MWT this date as he walked 67877fompared to 534f86f eval before needing to stop and rest, but his LLE pain still increased to 8/10. Pt requests to be d/c at this time and states that he can do his exercises at home. PT encouraged him to steadily increase his walking time/distance/frequency and he verbalized understanding. Updated HEP to include hip and core strengthening and he verbalized understanding. Pt d/c to his HEP at this time.     Rehab Potential  Fair    PT Frequency  2x / week    PT Duration  3 weeks    PT Treatment/Interventions  ADLs/Self Care Home Management;Aquatic Therapy;Cryotherapy;Electrical Stimulation;Moist Heat;Ultrasound;Gait training;Stair training;Functional mobility training;Therapeutic activities;Therapeutic exercise;Balance training;Neuromuscular re-education;Patient/family education;Manual techniques;Scar mobilization;Passive range of motion;Dry needling;Taping    PT Next Visit Plan  d/c to HEP    PT Home Exercise Plan  eval: supine 90/90 HS stretch, bridging; 12/17: SLR, clams RTB, STS, 3D thoracic excursions; 12/19: bridge iwth RTB, standing hip abd RTB, sidestep RTB, fwd/lat step ups    Consulted and Agree with Plan of Care  Patient       Patient will benefit from skilled therapeutic intervention in order to improve the following deficits and  impairments:  Abnormal gait, Decreased activity tolerance, Decreased balance, Decreased endurance, Decreased mobility, Decreased range of motion, Decreased strength, Difficulty walking, Hypomobility, Increased fascial restricitons, Increased muscle spasms, Impaired flexibility, Improper body mechanics, Pain  Visit Diagnosis: Chronic bilateral low back pain with left-sided sciatica  Difficulty in walking, not elsewhere classified  Muscle weakness (generalized)     Problem List Patient Active Problem List   Diagnosis Date Noted  . Personal history of colonic polyps   . Benign neoplasm of ascending colon   . Benign neoplasm of transverse colon   . Polyp of sigmoid colon   . Diverticulosis of large intestine without diverticulitis   . Lumbar stenosis with neurogenic claudication 06/01/2018  . Prostate cancer (HCC)[place]/05/2015       BrooGeraldine Solar DPT [facility] [address], [place]ne: [phone]:  [phone]: JameNORMAND DAMRON: [id number] of Birth: 01/2011/28/52

## 2018-10-12 ENCOUNTER — Ambulatory Visit (INDEPENDENT_AMBULATORY_CARE_PROVIDER_SITE_OTHER): Payer: Medicare Other | Admitting: Otolaryngology

## 2018-10-12 DIAGNOSIS — J310 Chronic rhinitis: Secondary | ICD-10-CM | POA: Diagnosis not present

## 2018-10-12 DIAGNOSIS — J343 Hypertrophy of nasal turbinates: Secondary | ICD-10-CM

## 2018-10-12 DIAGNOSIS — J342 Deviated nasal septum: Secondary | ICD-10-CM | POA: Diagnosis not present

## 2018-10-12 DIAGNOSIS — R0982 Postnasal drip: Secondary | ICD-10-CM | POA: Diagnosis not present

## 2018-10-16 IMAGING — CR DG LUMBAR SPINE 2-3V
2 series · 2 of 2 positions shown · non-contrast
Comparison: MRI lumbar spine 12/19/2017.

CLINICAL DATA: Intraoperative localization film for patient
undergoing L4-5 surgery.

EXAM:
LUMBAR SPINE - 2-3 VIEW

[lateral (1 of 2)]
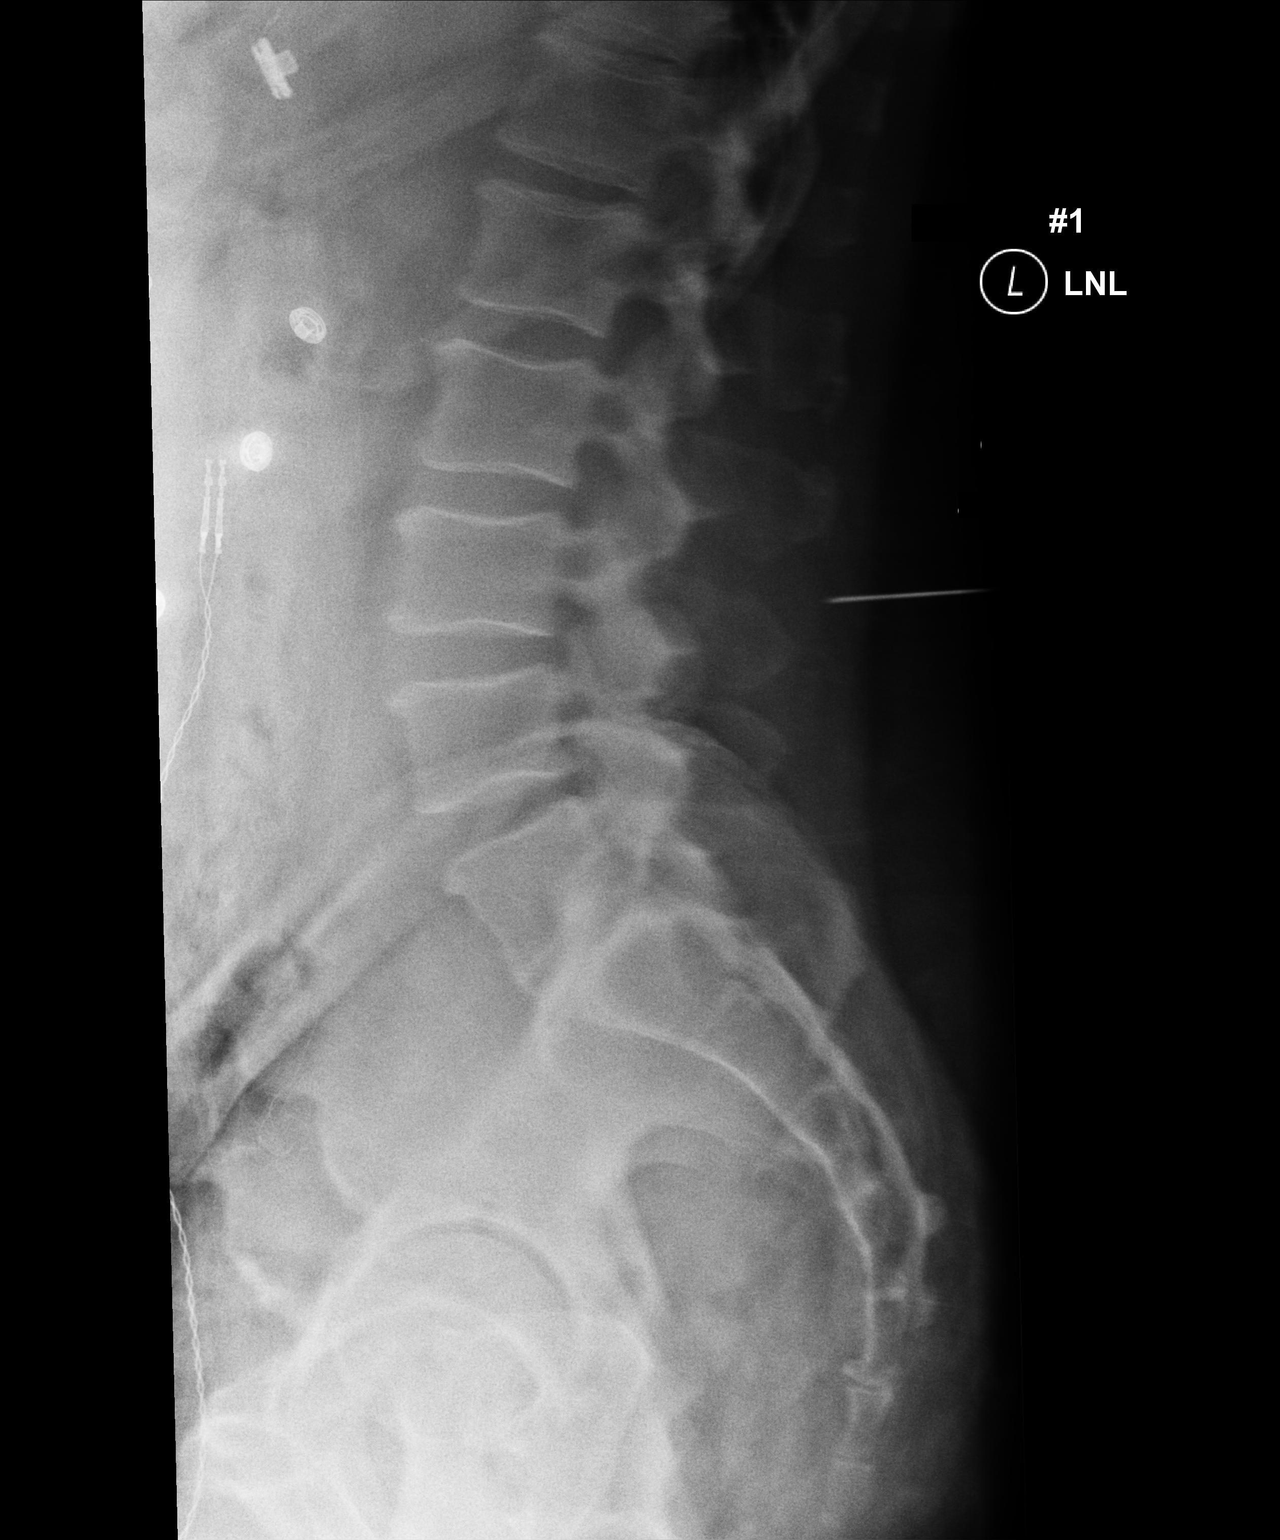

[lateral (2 of 2)]
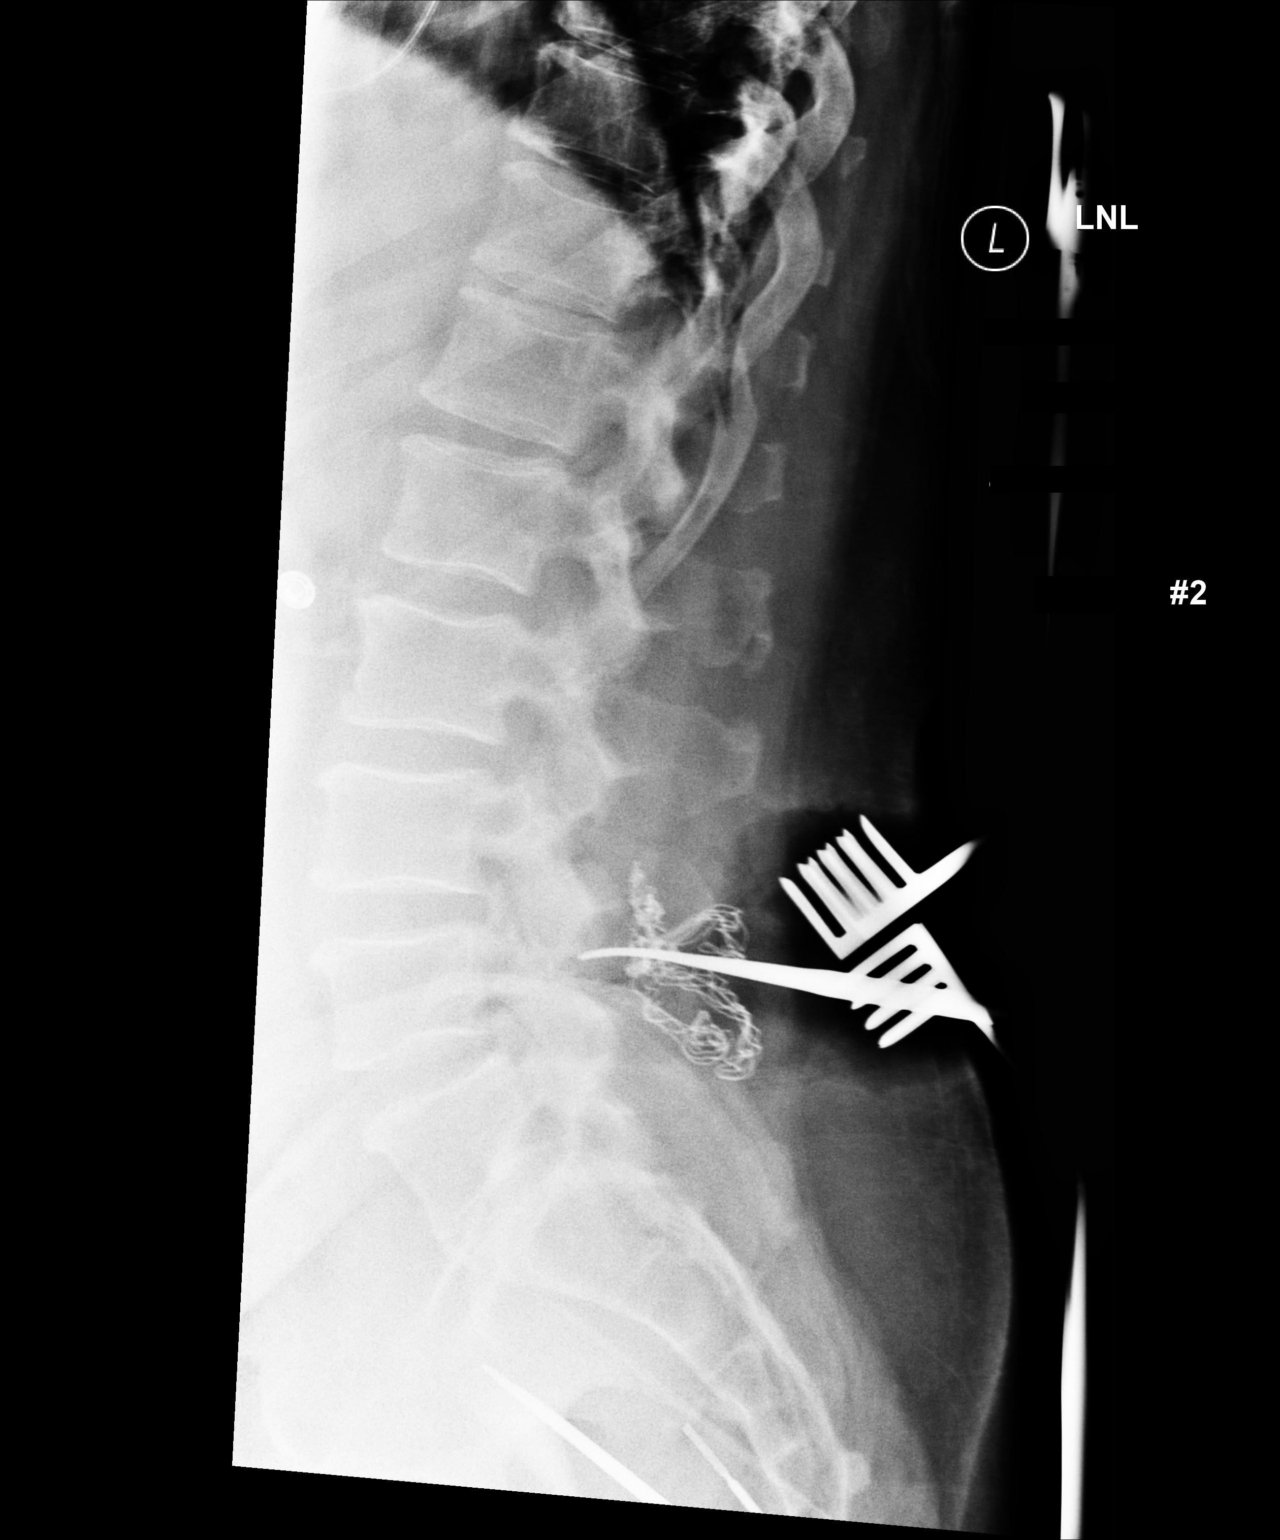

[2 of 2 positions shown; findings below may reference images not displayed]

FINDINGS: Single lateral view of the lumbar spine demonstrates a probe
directed toward the inferior aspect of the L3 vertebral body.
IMPRESSION: Localization as above.

## 2018-10-16 IMAGING — RF DG C-ARM 61-120 MIN
1 series · 3 of 3 positions shown · non-contrast
Comparison: MRI dated 12/19/2017 and lumbar radiographs dated
12/30/2017

CLINICAL DATA: Spinal stenosis.  Lumbar disc protrusion.

EXAM:
DG C-ARM 61-120 MIN; LUMBAR SPINE - 2-3 VIEW

[Series 1: run · 3 of 3 slices shown]
[im 1/3]
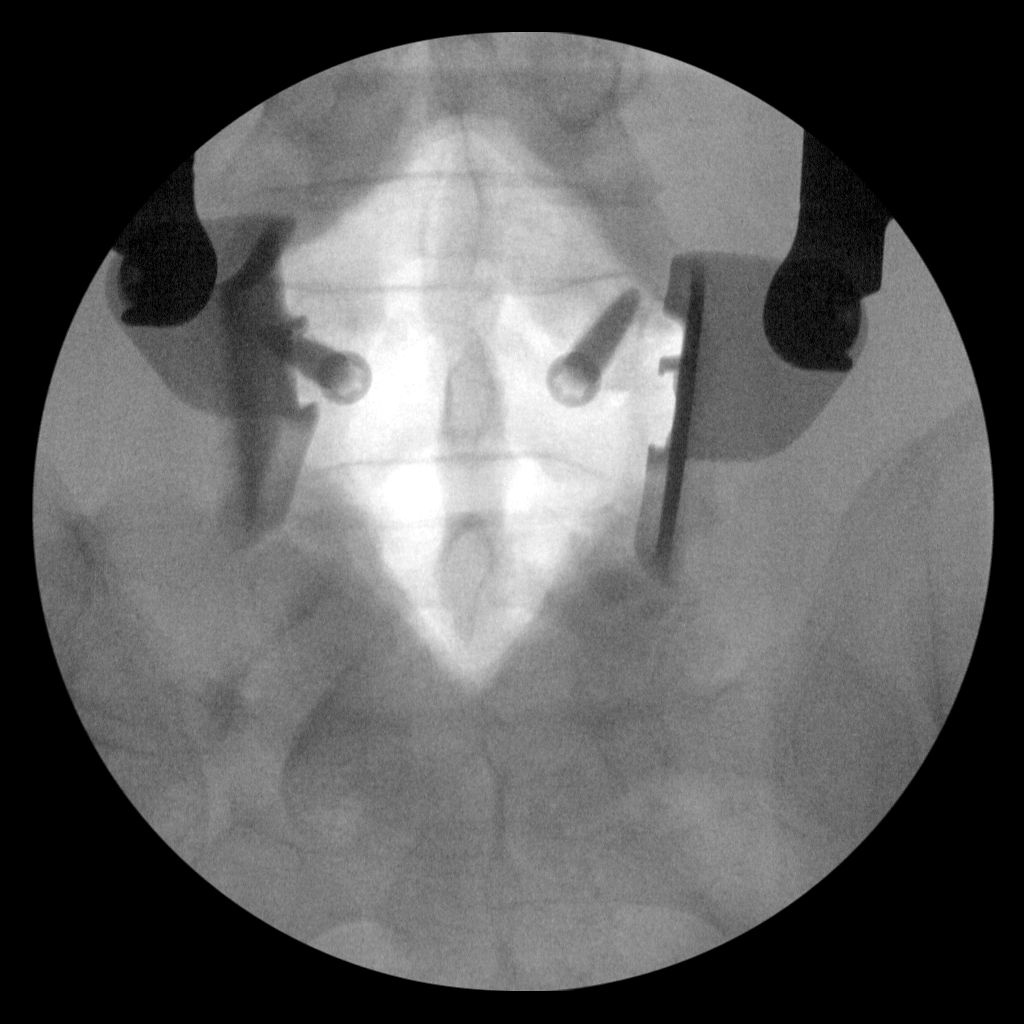
[im 2/3]
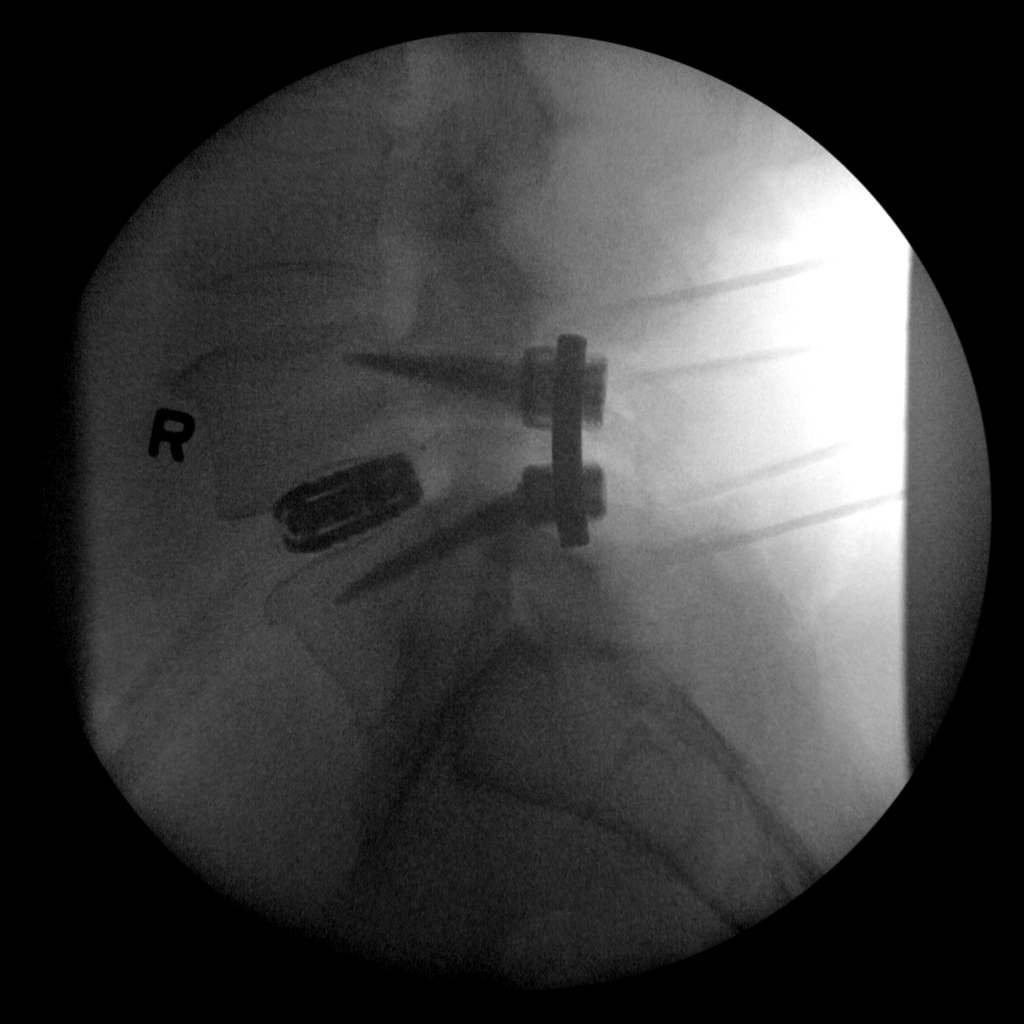
[im 3/3]
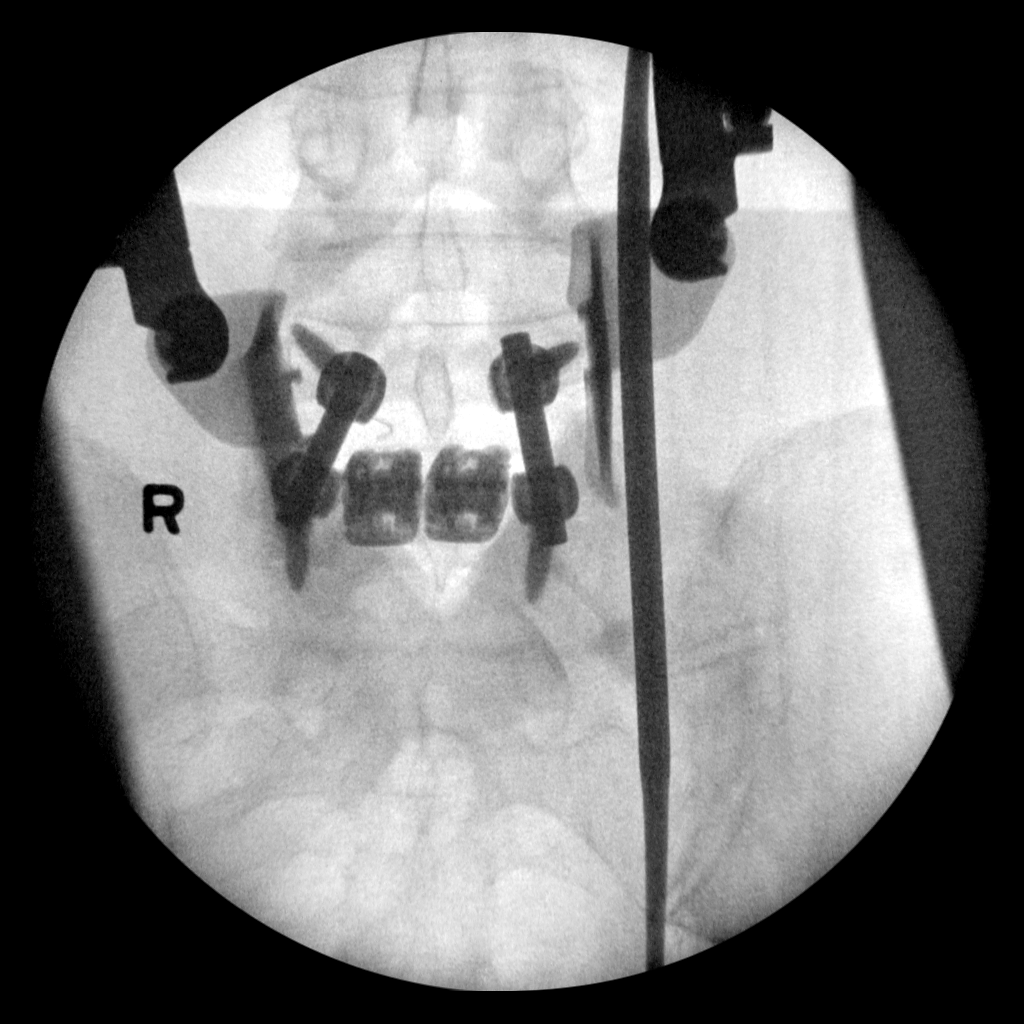

[3 of 3 positions shown; findings below may reference images not displayed]

FINDINGS: AP and lateral C-arm images demonstrate the patient has undergone
interbody and posterior fusion at L4-5. Alignment of the lumbar
vertebra is anatomic. Hardware appears in good position in the AP
and lateral projections.
IMPRESSION: Interbody and posterior fusion performed at L4-5.

FLUOROSCOPY TIME:  94 seconds

C-arm fluoroscopic images were obtained intraoperatively and
submitted for post operative interpretation.

## 2018-11-23 ENCOUNTER — Ambulatory Visit (INDEPENDENT_AMBULATORY_CARE_PROVIDER_SITE_OTHER): Payer: Medicare Other | Admitting: Otolaryngology

## 2018-11-23 DIAGNOSIS — J310 Chronic rhinitis: Secondary | ICD-10-CM | POA: Diagnosis not present

## 2018-11-23 DIAGNOSIS — J343 Hypertrophy of nasal turbinates: Secondary | ICD-10-CM | POA: Diagnosis not present

## 2018-11-23 DIAGNOSIS — H5203 Hypermetropia, bilateral: Secondary | ICD-10-CM | POA: Diagnosis not present

## 2018-11-23 DIAGNOSIS — J342 Deviated nasal septum: Secondary | ICD-10-CM | POA: Diagnosis not present

## 2018-11-23 DIAGNOSIS — H524 Presbyopia: Secondary | ICD-10-CM | POA: Diagnosis not present

## 2018-11-23 DIAGNOSIS — H10503 Unspecified blepharoconjunctivitis, bilateral: Secondary | ICD-10-CM | POA: Diagnosis not present

## 2018-11-23 DIAGNOSIS — H52223 Regular astigmatism, bilateral: Secondary | ICD-10-CM | POA: Diagnosis not present

## 2018-12-03 DIAGNOSIS — Z1389 Encounter for screening for other disorder: Secondary | ICD-10-CM | POA: Diagnosis not present

## 2018-12-03 DIAGNOSIS — Z6838 Body mass index (BMI) 38.0-38.9, adult: Secondary | ICD-10-CM | POA: Diagnosis not present

## 2018-12-03 DIAGNOSIS — J069 Acute upper respiratory infection, unspecified: Secondary | ICD-10-CM | POA: Diagnosis not present

## 2018-12-16 DIAGNOSIS — H52223 Regular astigmatism, bilateral: Secondary | ICD-10-CM | POA: Diagnosis not present

## 2018-12-16 DIAGNOSIS — H5203 Hypermetropia, bilateral: Secondary | ICD-10-CM | POA: Diagnosis not present

## 2018-12-16 DIAGNOSIS — H524 Presbyopia: Secondary | ICD-10-CM | POA: Diagnosis not present

## 2018-12-16 DIAGNOSIS — H10503 Unspecified blepharoconjunctivitis, bilateral: Secondary | ICD-10-CM | POA: Diagnosis not present

## 2018-12-22 DIAGNOSIS — Z981 Arthrodesis status: Secondary | ICD-10-CM | POA: Diagnosis not present

## 2018-12-22 DIAGNOSIS — M5136 Other intervertebral disc degeneration, lumbar region: Secondary | ICD-10-CM | POA: Diagnosis not present

## 2018-12-22 DIAGNOSIS — M47816 Spondylosis without myelopathy or radiculopathy, lumbar region: Secondary | ICD-10-CM | POA: Diagnosis not present

## 2019-02-01 ENCOUNTER — Ambulatory Visit: Payer: BLUE CROSS/BLUE SHIELD

## 2019-02-01 ENCOUNTER — Telehealth: Payer: BLUE CROSS/BLUE SHIELD

## 2019-02-01 MED ORDER — PREDNISONE 10 MG PO TABS
ORAL_TABLET | ORAL | 1 refills | Status: AC
Start: 2019-02-01 — End: ?

## 2019-02-01 MED ORDER — EPINEPHRINE 0.3 MG/0.3ML IJ SOAJ
.3 mg | PEN_INJECTOR | INTRAMUSCULAR | 1 refills | Status: AC | PRN
Start: 2019-02-01 — End: ?

## 2019-02-01 MED ORDER — AZITHROMYCIN 250 MG PO TABS
ORAL_TABLET | 0 refills | Status: AC
Start: 2019-02-01 — End: ?

## 2019-02-01 NOTE — Telephone Encounter
Patient called to discuss concern of avoiding ED in case he develops an asthma exacerbation. He is sheltering at home. Has not had an exacerbation in many years but would like prescriptions to have at home in case he needs to start them. If he develops symptoms of an exacerbation he will contact me for a tele-visit unless he is clinically severe at which point he would go to the ED.  z pack, prednisone taper and epi pen (which the patient used in the past for asthma exacerbations) were rx.

## 2019-02-15 ENCOUNTER — Telehealth: Payer: BLUE CROSS/BLUE SHIELD

## 2019-02-15 NOTE — Telephone Encounter
I spoke with the patient on the phone regarding their upcoming appointment with Dr. Evelena Leyden on 02/23/19.     They have agreed to change this from in person to a Telemedicine video visit.    I let them know this would need to be via a smart phone or tablet.    Patient will have his assistant call to be walked through setting up his MyChart and downloading the app.

## 2019-02-15 NOTE — Telephone Encounter
PDL Call to Practice    Reason for Call: Caller is returning Shannon's call.   MD: Evelena Leyden    Appointment Related?  []  Yes  [x]  No     If yes;  Date:  Time:    Call warm transferred to PDL: [x]  Yes  []  No    Call Received by Practice Representative:Sue Lush

## 2019-02-16 NOTE — Telephone Encounter
Walked through process with patients assistance.  Advised her as soon as the MyChart was active that I would send instructions via the patient portal on how to get the app and to join the video visit.

## 2019-02-16 NOTE — Telephone Encounter
Patient states his assistant robin will call back to help set up a video visit.

## 2019-02-17 ENCOUNTER — Ambulatory Visit: Payer: BLUE CROSS/BLUE SHIELD

## 2019-02-17 NOTE — Telephone Encounter
MyChart message sent with instructions and helpful hints on downloading the needed app as well as how to join the video visit. Advised to call the office with any questions.

## 2019-02-23 ENCOUNTER — Telehealth: Payer: BLUE CROSS/BLUE SHIELD

## 2019-02-23 DIAGNOSIS — J45909 Unspecified asthma, uncomplicated: Secondary | ICD-10-CM

## 2019-02-23 NOTE — Nursing Note
Medical Specialty Video Visit     I have contacted the patient to ensure that he/she is ready for today's video visit.     [] Left a message on the patient's voicemail reminding patient of video visit with MD today.     X     Advised patient to download MyChart mobile app and complete the eCheck-in questionnaire and video consent.              X       Provided MyChart Support Number: [phone]     [x] Patient has downloaded MyChart mobile app   [x] Patient has completed eCheck-in questionnaire   [x] Patient has completed Video Visit Consent   [x] Patient was instructed to join the Video Visit 5-10 minutes before the appointment,     [x] I have assisted the patient in preparing for today's video visit with MD; downloading of MyChart mobile app and how to join Video Visit.     Vital Signs reported by patient.  Allergy and Medication reconciliation completed.    Refills requested:  [] Yes   [x] No   Pharmacy confirmed: [x] Yes  [] No    Nurse: Marvell Tamer FLORES SR. LVN

## 2019-02-23 NOTE — Progress Notes
Patient Consent to Telehealth Questionnaire   [facility] TELEHEALTH PRECHECKIN QUESTIONS 02/23/2019   By clicking ''I Agree'', I consent to the below:  I Agree     - I agree  to be treated via a video visit and acknowledge that I may be liable for any relevant copays or coinsurance depending on my insurance plan.  - I understand that this video visit is offered for my convenience and I am able to cancel and reschedule for an in-person appointment if I desire.  - I also acknowledge that sensitive medical information may be discussed during this video visit appointment and that it is my responsibility to locate myself in a location that ensures privacy to my own level of comfort.  - I also acknowledge that I should not be participating in a video visit in a way that could cause danger to myself or to those around me (such as driving or walking).  If my provider is concerned about my safety, I understand that they have the right to terminate the visit.               PULMONARY/SLEEP MEDICINE NOTE    PCP: Babs Sciara, MD      Chief Complaint   Patient presents with   ??? Follow-up       REFERRING PRACTITIONER: Babs Sciara, MD  PRIMARY CARE PROVIDER: Babs Sciara, MD  CHIEF COMPLAINT: follow up on OSA and asthma    History of Presenting Illness:  Maurice Ramirez???is a 68 y.o.???male???with  moderate persistent asthma, intermittent episodes of allergic bronchopulmonary aspergillosis , h/o LLL nodule (bx in 2012 benign), OSA on PAP, and h/o afib s/p cardioversion. Tele-visit today. No card download available. His last chip check was in 2017.    He has been using his device every night, still wakes up with the mask off and does not recall why.  Feels better overall using his CPAP. He is exercising daily (brisk walk/run outside).  Has been staying at home with daily run with COVID-19.???  Asthma meds and hx:  Singulair 10mg  daily, advair 500/50 and albuterol (1x/3-4 days) Last hospital visit for asthma was several years ago, intubated about 43yrs ago for a severe attack  Last use of prednisone dose for asthma (prior to sinus infections) was several years ago      COMPLIANCE DATA: No new data today   Data Range:???02/05/2016 to 05/04/2016  Days with Device Usage:???86???days  Days without Device Usage: 4???days   % Days with Device Usage: 95.6???%   Average Usage (days used):???4???Hrs 49???Mins 37???Secs  % Days with Usage >???= 4 hours: 56.7???%   AHI (Apnea Hypopnea Index):???3.4  Auto CPAP Mean Pressure:???7.5???cmH2O  Auto CPAP Peak Average Pressure:??????9.9???cmH2O  Average Device Pressure <= 90% of Time: ???9.1???cmH2O  Average Time in Large Leak:???0???Hrs 5???Mins 26???Secs       Medications that the patient states to be currently taking   Medication Sig   ??? ADVAIR DISKUS 500-50 MCG/DOSE diskus USE 1 INHALATION TWICE A DAY   ??? azithromycin 250 mg tablet Take 2 tablets (500 mg) on  Day 1,  followed by 1 tablet (250 mg) once daily on Days 2 through 5..   ??? EPINEPHrine 0.3 mg/0.3 mL auto-injector Inject 0.3 mLs (0.3 mg total) into the muscle as needed for for Anaphylaxis.   ??? MONTELUKAST 10 mg tablet TAKE 1 TABLET EVERY EVENING   ??? VENTOLIN HFA 108 (90 Base) MCG/ACT inhaler USE 2 INHALATIONS EVERY 6 HOURS AS NEEDED  PHYSICAL EXAM:  Temp 37 ???C (98.6 ???F) (Oral)  ~ Ht 5' 7.8'' (1.722 m)  ~ Wt 170 lb (77.1 kg)  ~ BMI 26.00 kg/m???   General: Well-developed male, in no acute distress.   HEENT: Normocephalic and atraumatic  Neuro: Alert, oriented and appropriately interactive. No focal deficits. Speech fluent.   Psychological: normal affect    LABS:  Lab Results   Component Value Date    HGB 15.2 07/14/2018     No results found for: FERRITIN  Lab Results   Component Value Date    TSH 2.2 06/04/2011       IgE 1666 07/2018 (<-1422)  Imaging:  CT chest 2012  Unchanged biapical fibronodular pleural parenchymal scarring and calcified subcentimeter nodule within the right middle lobe (2-51), consistent with prior granulomatous disease.???  Persistent diffuse peribronchial and peribronchiolar thickening with associated airway irregularity and occasional ectasia, consistent   with chronic asthma/bronchitis, with scattered bronchocentric bands and architectural distortion within both upper, right middle and left   greater than right lower lobes, consistent with postinflammatory pleural parenchymal scarring.  Near-complete resolution of mass-like consolidation within the left lower lobe with residual architectural distortion (2-56). Several   fluctuating subcentimeter nodules within the left lower lobe and resolved within the right lower lobe since 05/28/2011, likely airway-related inflammatory and mucus impaction.  ???  Pathology:  LLL nodule 05/2011  LUNG NODULE, LEFT LOWER LOBE (BIOPSY):  - Acute and organizing pneumonia (see Comment)  - Prominent chronic inflammation in interstitium  - Gram stain is negative for bacterial organisms  - GMS stain is negative for fungal organisms  - AFB stain is negative for acid-fast bacilli  - No neoplasms identified  ???  PFT 07/2018  FVC 3.95 (94.8%),  FEV1  1.51 (48.7%)  DLCO 28.4 (100%)  PFT 09/2014:  FVC 4.04 (93.7%), post 4.436 (103.6%)  FEV1  1.29 (39.8%), post 1.67 (51.6%)  DLCO 100.7%  PFT 08/2010  FVC 4.80 (109%), post 4.99 (114%)  FEV 1.85 (55%), post 2.01 (60%)  ???    COMPLIANCE DATA: not available    ASSESSMENT: 68M w/ OSA on PAP therapy, Asthma (FEV1 48%), and prior ABPA who presents for follow up of PAP therapy.      Obstructive Sleep Apnea:  Continue with PAP therapy, the patient has acclimated well and is experiencing symptomatic improvement.  Will recheck compliance once patient comes in for visit.  The patient is willing to continue with PAP therapy.  COVID precautions for cpap given    Asthma  Well controlled  No change in therapy  Given dose of z pack, prednisone and epi pen in case of asthma exacerbation Repeat PFTs and blood work in next year once patient comes in      I discussed the plan in detail with the patient who voiced understanding and is in agreement.    >50% of the visit (20 minutes) was spent counseling and coordinating care.    Follow up: ~6 months, sooner if needed.      Maurice Ramirez

## 2019-02-23 NOTE — Telephone Encounter
Scheduled pt for a 5 month f/u appt.    Confirmed appt with pt, per pt if appt doesn't work for him he will have his assistant give [place] a call.    Provided pt with callback number [phone].    - A.C

## 2019-02-23 NOTE — Patient Instructions
Follow up 5 months

## 2019-03-15 DIAGNOSIS — C61 Malignant neoplasm of prostate: Secondary | ICD-10-CM | POA: Diagnosis not present

## 2019-03-18 DIAGNOSIS — J301 Allergic rhinitis due to pollen: Secondary | ICD-10-CM | POA: Diagnosis not present

## 2019-03-18 DIAGNOSIS — I10 Essential (primary) hypertension: Secondary | ICD-10-CM | POA: Diagnosis not present

## 2019-03-18 DIAGNOSIS — J449 Chronic obstructive pulmonary disease, unspecified: Secondary | ICD-10-CM | POA: Diagnosis not present

## 2019-03-18 DIAGNOSIS — C61 Malignant neoplasm of prostate: Secondary | ICD-10-CM | POA: Diagnosis not present

## 2019-03-22 DIAGNOSIS — N281 Cyst of kidney, acquired: Secondary | ICD-10-CM | POA: Diagnosis not present

## 2019-03-22 DIAGNOSIS — N3040 Irradiation cystitis without hematuria: Secondary | ICD-10-CM | POA: Diagnosis not present

## 2019-03-22 DIAGNOSIS — C61 Malignant neoplasm of prostate: Secondary | ICD-10-CM | POA: Diagnosis not present

## 2019-03-22 DIAGNOSIS — N5201 Erectile dysfunction due to arterial insufficiency: Secondary | ICD-10-CM | POA: Diagnosis not present

## 2019-03-22 DIAGNOSIS — N393 Stress incontinence (female) (male): Secondary | ICD-10-CM | POA: Diagnosis not present

## 2019-03-25 ENCOUNTER — Emergency Department (HOSPITAL_COMMUNITY)
Admission: EM | Admit: 2019-03-25 | Discharge: 2019-03-25 | Disposition: A | Discharge status: Home / Self Care | Payer: Medicare Other | Attending: Emergency Medicine | Admitting: Emergency Medicine

## 2019-03-25 ENCOUNTER — Emergency Department (HOSPITAL_COMMUNITY): Payer: Medicare Other

## 2019-03-25 ENCOUNTER — Encounter (HOSPITAL_COMMUNITY): Payer: Self-pay | Admitting: Emergency Medicine

## 2019-03-25 ENCOUNTER — Other Ambulatory Visit: Payer: Self-pay

## 2019-03-25 DIAGNOSIS — R109 Unspecified abdominal pain: Secondary | ICD-10-CM | POA: Diagnosis not present

## 2019-03-25 DIAGNOSIS — Z79899 Other long term (current) drug therapy: Secondary | ICD-10-CM | POA: Insufficient documentation

## 2019-03-25 DIAGNOSIS — R0789 Other chest pain: Secondary | ICD-10-CM | POA: Diagnosis not present

## 2019-03-25 DIAGNOSIS — F17210 Nicotine dependence, cigarettes, uncomplicated: Secondary | ICD-10-CM | POA: Diagnosis not present

## 2019-03-25 DIAGNOSIS — I10 Essential (primary) hypertension: Secondary | ICD-10-CM | POA: Diagnosis not present

## 2019-03-25 DIAGNOSIS — R1013 Epigastric pain: Secondary | ICD-10-CM | POA: Diagnosis not present

## 2019-03-25 DIAGNOSIS — Z8546 Personal history of malignant neoplasm of prostate: Secondary | ICD-10-CM | POA: Insufficient documentation

## 2019-03-25 LAB — CBC WITH DIFFERENTIAL/PLATELET
Abs Immature Granulocytes: 0.04 10*3/uL (ref 0.00–0.07)
Basophils Absolute: 0.1 10*3/uL (ref 0.0–0.1)
Basophils Relative: 1 %
Eosinophils Absolute: 0.4 10*3/uL (ref 0.0–0.5)
Eosinophils Relative: 4 %
HCT: 49.6 % (ref 39.0–52.0)
Hemoglobin: 15.3 g/dL (ref 13.0–17.0)
Immature Granulocytes: 0 %
Lymphocytes Relative: 18 %
Lymphs Abs: 1.6 10*3/uL (ref 0.7–4.0)
MCH: 28.2 pg (ref 26.0–34.0)
MCHC: 30.8 g/dL (ref 30.0–36.0)
MCV: 91.3 fL (ref 80.0–100.0)
Monocytes Absolute: 0.7 10*3/uL (ref 0.1–1.0)
Monocytes Relative: 8 %
Neutro Abs: 6.4 10*3/uL (ref 1.7–7.7)
Neutrophils Relative %: 69 %
Platelets: 257 10*3/uL (ref 150–400)
RBC: 5.43 MIL/uL (ref 4.22–5.81)
RDW: 14.8 % (ref 11.5–15.5)
WBC: 9.2 10*3/uL (ref 4.0–10.5)
nRBC: 0 % (ref 0.0–0.2)

## 2019-03-25 LAB — COMPREHENSIVE METABOLIC PANEL
ALT: 31 U/L (ref 0–44)
AST: 21 U/L (ref 15–41)
Albumin: 3.8 g/dL (ref 3.5–5.0)
Alkaline Phosphatase: 66 U/L (ref 38–126)
Anion gap: 13 (ref 5–15)
BUN: 19 mg/dL (ref 8–23)
CO2: 27 mmol/L (ref 22–32)
Calcium: 8.8 mg/dL — ABNORMAL LOW (ref 8.9–10.3)
Chloride: 98 mmol/L (ref 98–111)
Creatinine, Ser: 1.31 mg/dL — ABNORMAL HIGH (ref 0.61–1.24)
GFR calc Af Amer: >60 mL/min (ref >60)
GFR calc non Af Amer: 56 mL/min — ABNORMAL LOW (ref >60)
Glucose, Bld: 120 mg/dL — ABNORMAL HIGH (ref 70–99)
Potassium: 3.8 mmol/L (ref 3.5–5.1)
Sodium: 138 mmol/L (ref 135–145)
Total Bilirubin: 0.4 mg/dL (ref 0.3–1.2)
Total Protein: 7.1 g/dL (ref 6.5–8.1)

## 2019-03-25 LAB — TROPONIN I: Troponin I: <0.03 ng/mL (ref <0.03)

## 2019-03-25 LAB — LIPASE, BLOOD: Lipase: 46 U/L (ref 11–51)

## 2019-03-25 MED ORDER — PANTOPRAZOLE SODIUM 40 MG IV SOLR
40.0000 mg | Freq: Once | INTRAVENOUS | Status: AC
  Administered 2019-03-25: 40 mg via INTRAVENOUS
  Filled 2019-03-25: qty 40

## 2019-03-25 MED ORDER — PANTOPRAZOLE SODIUM 20 MG PO TBEC: 20.0000 mg | DELAYED_RELEASE_TABLET | Freq: Every day | ORAL | 0 refills | Status: DC

## 2019-03-25 NOTE — ED Triage Notes (Signed)
[facility] - pt c/o of chest pain that started at 1000 today and has been on and off all day. States the pain is center chest, through the back, and ribs. Took a zantac this AM and helped with the pain. Pain was relieved until 1800 tonight then the pt took 324 ASA

## 2019-03-25 NOTE — ED Provider Notes (Signed)
[facility] EMERGENCY DEPARTMENT Provider Note   CSN: [id number] Arrival date & time: 03/25/19  2048     History   Chief Complaint Chief Complaint  Patient presents with  . Chest Pain    HPI TRAY Mark Cannon is a 68 y.o. male.     Patient had epigastric pain today that lasted for a few minutes.  Patient feels fine now  The history is provided by the patient. No language interpreter was used.  Chest Pain Pain location:  Epigastric Pain quality: aching   Pain radiates to:  Does not radiate Pain severity:  Moderate Onset quality:  Sudden Timing:  Sporadic Progression:  Resolved Chronicity:  New Context: not breathing   Relieved by:  Nothing Worsened by:  Nothing Ineffective treatments:  None tried Associated symptoms: abdominal pain   Associated symptoms: no back pain, no cough, no fatigue and no headache     Past Medical History:  Diagnosis Date  . Arthritis   . Cancer [facility])    Prostate, has had prostate removed, has had radiation and still has cancer,  . Colon polyps   . Cough   . GERD (gastroesophageal reflux disease)    occasional   . H/O tooth extraction week of 03/15/2015   . History of kidney stones   . Hypercholesteremia   . Hypertension   . Lumbar stenosis with neurogenic claudication   . Pneumonia   . Pre-diabetes   . Renal disorder   . Sleep apnea    No cpap    Patient Active Problem List   Diagnosis Date Noted  . Personal history of colonic polyps   . Benign neoplasm of ascending colon   . Benign neoplasm of transverse colon   . Polyp of sigmoid colon   . Diverticulosis of large intestine without diverticulitis   . Lumbar stenosis with neurogenic claudication 06/01/2018  . Prostate cancer ([place]) 03/22/2015    Past Surgical History:  Procedure Laterality Date  . APPENDECTOMY    . BACK SURGERY    . CHOLECYSTECTOMY    . COLONOSCOPY  02/04/2012   Procedure: COLONOSCOPY;  Surgeon: Jamesetta So, MD;  Location: [facility] ENDO SUITE;  Service:  Gastroenterology;  Laterality: N/A;  . COLONOSCOPY N/A 09/22/2018   Procedure: COLONOSCOPY;  Surgeon: Aviva Signs, MD;  Location: [facility] ENDO SUITE;  Service: Gastroenterology;  Laterality: N/A;  . COLONOSCOPY W/ POLYPECTOMY    . HERNIA REPAIR     Bilateral inguinal hernia repair X 4  . hydrocelectomy    . left knee arthroscopy    . LITHOTRIPSY    . LYMPHADENECTOMY Bilateral 03/22/2015   Procedure: PELVIC LYMPHADENECTOMY;  Surgeon: Alexis Frock, MD;  Location: [facility] ORS;  Service: Urology;  Laterality: Bilateral;  . NM MYOVIEW LTD  2011  . POLYPECTOMY  09/22/2018   Procedure: POLYPECTOMY;  Surgeon: Aviva Signs, MD;  Location: [facility] END[facility] SUITE;  Service: Gastroenterology;;  . ROBOT ASSISTED LAPAROSCOPIC RADICAL PROSTATECTOMY N/A 03/22/2015   Procedure: ROBOTIC ASSISTED LAPAROSCOPIC RADICAL PROSTATECTOMY WITH INDOCYANINE GREEN DYE INJECTION;  Surgeon: Alexis Frock, MD;  Location: [facility] ORS;  Service: Urology;  Laterality: N/A;  . ROBOTIC ASSISTED LAPAROSCOPIC LYSIS OF ADHESION  03/22/2015   Procedure: ROBOTIC ASSISTED LAPAROSCOPIC LYSIS OF ADHESION;  Surgeon: Alexis Frock, MD;  Location: [facility] ORS;  Service: Urology;;  . VASECTOMY          Home Medications    Prior to Admission medications   Medication Sig Start Date End Date Taking? Authorizing Provider  cetirizine (ZYRTEC) 10  MG tablet Take 10 mg by mouth daily.    [provider]  lisinopril-hydrochlorothiazide (PRINZIDE,ZESTORETIC) 20-25 MG per tablet Take 1 tablet by mouth daily.    [provider]  meloxicam (MOBIC) 15 MG tablet Take 15 mg by mouth daily. 01/27/18   [provider]  pantoprazole (PROTONIX) 20 MG tablet Take 1 tablet (20 mg total) by mouth daily. 03/25/19   Milton Ferguson, MD  ranitidine (ZANTAC) 150 MG tablet Take 150 mg by mouth daily as needed for heartburn.     [provider]  traMADol (ULTRAM) 50 MG tablet Take 50 mg by mouth every 6 (six) hours as needed (back pain).     [provider]    Family History Family History  Problem Relation Age of Onset  . Heart failure Father   . Stroke Father   . Colon cancer Neg Hx     Social History Social History   Tobacco Use  . Smoking status: Current Every Day Smoker    Packs/day: 1.00    Years: 50.00    Pack years: 50.00    Types: Cigarettes  . Smokeless tobacco: Never Used  Substance Use Topics  . Alcohol use: Yes    Alcohol/week: 1.0 standard drinks    Types: 1 Glasses of wine per week    Comment: occasional  . Drug use: No     Allergies   Lidocaine and Benzocaine   Review of Systems Review of Systems  Constitutional: Negative for appetite change and fatigue.  HENT: Negative for congestion, ear discharge and sinus pressure.   Eyes: Negative for discharge.  Respiratory: Negative for cough.   Cardiovascular: Positive for chest pain.  Gastrointestinal: Positive for abdominal pain. Negative for diarrhea.  Genitourinary: Negative for frequency and hematuria.  Musculoskeletal: Negative for back pain.  Skin: Negative for rash.  Neurological: Negative for seizures and headaches.  Psychiatric/Behavioral: Negative for hallucinations.     Physical Exam Updated Vital Signs BP 136/69   Pulse 78   Resp (!) 8   Ht 5\' 8"  (1.727 m)   Wt 108.9 kg   SpO2 91%   BMI 36.49 kg/m   Physical Exam Vitals signs and nursing note reviewed.  Constitutional:      Appearance: He is well-developed.  HENT:     Head: Normocephalic.     Nose: Nose normal.  Eyes:     General: No scleral icterus.    Conjunctiva/sclera: Conjunctivae normal.  Neck:     Musculoskeletal: Neck supple.     Thyroid: No thyromegaly.  Cardiovascular:     Rate and Rhythm: Normal rate and regular rhythm.     Heart sounds: No murmur. No friction rub. No gallop.   Pulmonary:     Breath sounds: No stridor. No wheezing or rales.  Chest:     Chest wall: No tenderness.  Abdominal:     General: There is no distension.     Tenderness:  There is abdominal tenderness. There is no rebound.  Musculoskeletal: Normal range of motion.  Lymphadenopathy:     Cervical: No cervical adenopathy.  Skin:    Findings: No erythema or rash.  Neurological:     Mental Status: He is oriented to person, place, and time.     Motor: No abnormal muscle tone.     Coordination: Coordination normal.  Psychiatric:        Behavior: Behavior normal.      ED Treatments / Results  Labs (all labs ordered are listed,  but only abnormal results are displayed) Labs Reviewed  COMPREHENSIVE METABOLIC PANEL - Abnormal; Notable for the following components:      Result Value   Glucose, Bld 120 (*)    Creatinine, Ser 1.31 (*)    Calcium 8.8 (*)    GFR calc non Af Amer 56 (*)    All other components within normal limits  CBC WITH DIFFERENTIAL/PLATELET  LIPASE, BLOOD  TROPONIN I    EKG    Radiology Dg Abd Acute 2+v W 1v Chest  Result Date: 03/25/2019 CLINICAL DATA:  Abdominal pain EXAM: DG ABDOMEN ACUTE W/ 1V CHEST COMPARISON:  04/10/2018 FINDINGS: Single-view chest demonstrates mild bronchitic changes. No consolidation or effusion. Normal heart size. No pneumothorax. Surgical hardware in the cervicothoracic spine. Supine and upright views of the abdomen demonstrate no free air beneath the diaphragm. Surgical clips in the right upper quadrant. Gas filled nonenlarged loops of small bowel in the left abdomen. Colon gas is present. Moderate to large amount of stool in the colon. Surgical hardware in the lower spine. IMPRESSION: 1. No radiographic evidence for acute cardiopulmonary abnormality. 2. Overall nonobstructed bowel gas pattern. Moderate stool in the colon Electronically Signed   By: Donavan Foil M.D.   On: 03/25/2019 22:18    Procedures Procedures (including critical care time)  Medications Ordered in ED Medications  pantoprazole (PROTONIX) injection 40 mg (40 mg Intravenous Given 03/25/19 2150)     Initial Impression / Assessment and  Plan / ED Course  I have reviewed the triage vital signs and the nursing notes.  Pertinent labs & imaging results that were available during my care of the patient were reviewed by me and considered in my medical decision making (see chart for details).        Labs unremarkable.  Patient will be treated for gastritis with Protonix.  Doubt coronary artery disease.  Final Clinical Impressions(s) / ED Diagnoses   Final diagnoses:  Epigastric pain    ED Discharge Orders         Ordered    pantoprazole (PROTONIX) 20 MG tablet  Daily     03/25/19 2242           Milton Ferguson, MD 03/25/19 2244

## 2019-03-25 NOTE — Discharge Instructions (Addendum)
Follow-up with your doctor next week for recheck 

## 2019-04-07 ENCOUNTER — Other Ambulatory Visit: Payer: Self-pay | Admitting: *Deleted

## 2019-04-07 DIAGNOSIS — Z122 Encounter for screening for malignant neoplasm of respiratory organs: Secondary | ICD-10-CM

## 2019-04-07 DIAGNOSIS — F17210 Nicotine dependence, cigarettes, uncomplicated: Secondary | ICD-10-CM

## 2019-04-07 DIAGNOSIS — Z87891 Personal history of nicotine dependence: Secondary | ICD-10-CM

## 2019-05-12 ENCOUNTER — Encounter: Payer: Self-pay | Admitting: Acute Care

## 2019-05-12 ENCOUNTER — Ambulatory Visit
Admission: RE | Admit: 2019-05-12 | Discharge: 2019-05-12 | Disposition: A | Discharge status: Home / Self Care | Payer: Medicare Other | Source: Ambulatory Visit | Attending: Acute Care | Admitting: Acute Care

## 2019-05-12 ENCOUNTER — Ambulatory Visit (INDEPENDENT_AMBULATORY_CARE_PROVIDER_SITE_OTHER): Payer: Medicare Other | Admitting: Acute Care

## 2019-05-12 ENCOUNTER — Other Ambulatory Visit: Payer: Self-pay

## 2019-05-12 VITALS — BP 152/76 | HR 101 | Ht 65.0 in | Wt 240.4 lb

## 2019-05-12 DIAGNOSIS — Z87891 Personal history of nicotine dependence: Secondary | ICD-10-CM | POA: Diagnosis not present

## 2019-05-12 DIAGNOSIS — F17210 Nicotine dependence, cigarettes, uncomplicated: Secondary | ICD-10-CM

## 2019-05-12 DIAGNOSIS — Z122 Encounter for screening for malignant neoplasm of respiratory organs: Secondary | ICD-10-CM

## 2019-05-12 NOTE — Progress Notes (Signed)
Shared Decision Making Visit Lung Cancer Screening Program [phone])   Eligibility:  Age 68 y.o.  Pack Years Smoking History Calculation 55 pack year smoking hisotry (# packs/per year x # years smoked)  Recent History of coughing up blood  no  Unexplained weight loss? no ( >Than 15 pounds within the last 6 months )  Prior History Lung / other cancer no (Diagnosis within the last 5 years already requiring surveillance chest CT Scans).  Smoking Status Current Smoker  Former Smokers: Years since quit:NA  Quit Date: NA  Visit Components:  Discussion included one or more decision making aids. yes  Discussion included risk/benefits of screening. yes  Discussion included potential follow up diagnostic testing for abnormal scans. yes  Discussion included meaning and risk of over diagnosis. yes  Discussion included meaning and risk of False Positives. yes  Discussion included meaning of total radiation exposure. yes  Counseling Included:  Importance of adherence to annual lung cancer LDCT screening. yes  Impact of comorbidities on ability to participate in the program. yes  Ability and willingness to under diagnostic treatment. yes  Smoking Cessation Counseling:  Current Smokers:   Discussed importance of smoking cessation. yes  Information about tobacco cessation classes and interventions provided to patient. yes  Patient provided with "ticket" for LDCT Scan. yes  Symptomatic Patient. no  Counseling  Diagnosis Code: Tobacco Use Z72.0  Asymptomatic Patient yes  Counseling (Intermediate counseling: > three minutes counseling) N8295  Former Smokers:   Discussed the importance of maintaining cigarette abstinence. yes  Diagnosis Code: Personal History of Nicotine Dependence. A21.308  Information about tobacco cessation classes and interventions provided to patient. Yes  Patient provided with "ticket" for LDCT Scan. yes  Written Order for Lung Cancer  Screening with LDCT placed in Epic. Yes (CT Chest Lung Cancer Screening Low Dose W/O CM) [id number] Z12.2-Screening of respiratory organs Z87.891-Personal history of nicotine dependence  BP (!) 152/76 (BP Location: Right Arm, Cuff Size: Large)   Pulse (!) 101   Ht 5\' 5"  (1.651 m)   Wt 240 lb 6.4 oz (109 kg)   SpO2 95%   BMI 40.00 kg/m    I have spent 25 minutes of face to face time with Carmelina Noun discussing the risks and benefits of lung cancer screening. We viewed a power point together that explained in detail the above noted topics. We paused at intervals to allow for questions to be asked and answered to ensure understanding.We discussed that the single most powerful action that he can take to decrease his risk of developing lung cancer is to quit smoking. We discussed whether or not he is ready to commit to setting a quit date. We discussed options for tools to aid in quitting smoking including nicotine replacement therapy, non-nicotine medications, support groups, Quit Smart classes, and behavior modification. We discussed that often times setting smaller, more achievable goals, such as eliminating 1 cigarette a day for a week and then 2 cigarettes a day for a week can be helpful in slowly decreasing the number of cigarettes smoked. This allows for a sense of accomplishment as well as providing a clinical benefit. I gave him the " Be Stronger Than Your Excuses" card with contact information for community resources, classes, free nicotine replacement therapy, and access to mobile apps, text messaging, and on-line smoking cessation help. I have also given him my card and contact information in the event he needs to contact me. We discussed the time and location of the scan, and  that either Doroteo Glassman RN or I will call with the results within 24-48 hours of receiving them. I have offered him  a copy of the power point we viewed  as a resource in the event they need reinforcement of the concepts  we discussed today in the office. The patient verbalized understanding of all of  the above and had no further questions upon leaving the office. They have my contact information in the event they have any further questions.  I spent 3 minutes counseling on smoking cessation and the health risks of continued tobacco abuse.  I explained to the patient that there has been a high incidence of coronary artery disease noted on these exams. I explained that this is a non-gated exam therefore degree or severity cannot be determined. This patient is not on statin therapy. I have asked the patient to follow-up with their PCP regarding any incidental finding of coronary artery disease and management with diet or medication as their PCP  feels is clinically indicated. The patient verbalized understanding of the above and had no further questions upon completion of the visit.   Magdalen Spatz, NP 05/12/2019 11:33 AM

## 2019-05-13 DIAGNOSIS — R7301 Impaired fasting glucose: Secondary | ICD-10-CM | POA: Diagnosis not present

## 2019-05-13 DIAGNOSIS — M5030 Other cervical disc degeneration, unspecified cervical region: Secondary | ICD-10-CM | POA: Diagnosis not present

## 2019-05-13 DIAGNOSIS — E7849 Other hyperlipidemia: Secondary | ICD-10-CM | POA: Diagnosis not present

## 2019-05-13 DIAGNOSIS — I10 Essential (primary) hypertension: Secondary | ICD-10-CM | POA: Diagnosis not present

## 2019-05-13 DIAGNOSIS — Z0001 Encounter for general adult medical examination with abnormal findings: Secondary | ICD-10-CM | POA: Diagnosis not present

## 2019-05-13 DIAGNOSIS — Z1389 Encounter for screening for other disorder: Secondary | ICD-10-CM | POA: Diagnosis not present

## 2019-05-13 DIAGNOSIS — J449 Chronic obstructive pulmonary disease, unspecified: Secondary | ICD-10-CM | POA: Diagnosis not present

## 2019-05-13 DIAGNOSIS — C61 Malignant neoplasm of prostate: Secondary | ICD-10-CM | POA: Diagnosis not present

## 2019-05-13 DIAGNOSIS — Z6837 Body mass index (BMI) 37.0-37.9, adult: Secondary | ICD-10-CM | POA: Diagnosis not present

## 2019-05-17 DIAGNOSIS — Z6837 Body mass index (BMI) 37.0-37.9, adult: Secondary | ICD-10-CM | POA: Diagnosis not present

## 2019-05-21 ENCOUNTER — Other Ambulatory Visit: Payer: Self-pay | Admitting: *Deleted

## 2019-05-21 DIAGNOSIS — Z87891 Personal history of nicotine dependence: Secondary | ICD-10-CM

## 2019-05-21 DIAGNOSIS — F17210 Nicotine dependence, cigarettes, uncomplicated: Secondary | ICD-10-CM

## 2019-05-21 DIAGNOSIS — Z122 Encounter for screening for malignant neoplasm of respiratory organs: Secondary | ICD-10-CM

## 2019-06-08 MED ORDER — MONTELUKAST SODIUM 10 MG PO TABS
ORAL_TABLET | 3 refills
Start: 2019-06-08 — End: ?

## 2019-06-12 MED ORDER — MONTELUKAST SODIUM 10 MG PO TABS
ORAL_TABLET | 3 refills | Status: AC
Start: 2019-06-12 — End: ?

## 2019-06-22 MED ORDER — ALBUTEROL SULFATE HFA 108 (90 BASE) MCG/ACT IN AERS
11 refills | 25.00000 days
Start: 2019-06-22 — End: ?

## 2019-06-25 MED ORDER — ALBUTEROL SULFATE HFA 108 (90 BASE) MCG/ACT IN AERS
11 refills | 25.00000 days
Start: 2019-06-25 — End: ?

## 2019-06-29 ENCOUNTER — Other Ambulatory Visit: Payer: Self-pay

## 2019-06-29 DIAGNOSIS — Z20822 Contact with and (suspected) exposure to covid-19: Secondary | ICD-10-CM

## 2019-07-01 LAB — NOVEL CORONAVIRUS, NAA: SARS-CoV-2, NAA: NOT DETECTED

## 2019-07-20 ENCOUNTER — Ambulatory Visit: Payer: BLUE CROSS/BLUE SHIELD

## 2019-07-22 MED ORDER — ALBUTEROL SULFATE HFA 108 (90 BASE) MCG/ACT IN AERS
2 | RESPIRATORY_TRACT | 11 refills | Status: AC | PRN
Start: 2019-07-22 — End: ?

## 2019-07-27 ENCOUNTER — Ambulatory Visit: Payer: BLUE CROSS/BLUE SHIELD

## 2019-07-27 DIAGNOSIS — G4733 Obstructive sleep apnea (adult) (pediatric): Secondary | ICD-10-CM

## 2019-07-27 DIAGNOSIS — J45909 Unspecified asthma, uncomplicated: Secondary | ICD-10-CM

## 2019-07-27 NOTE — Progress Notes
PULMONARY/SLEEP MEDICINE NOTE    PCP: Babs Sciara, MD      Chief Complaint   Patient presents with   ? Sleep Apnea       REFERRING PRACTITIONER: Babs Sciara, MD  PRIMARY CARE PROVIDER: Babs Sciara, MD  CHIEF COMPLAINT: follow up on OSA and asthma    History of Presenting Illness:  Maurice Ramirez?is a 68 y.o.?male?with  moderate persistent asthma, intermittent episodes of allergic bronchopulmonary aspergillosis , h/o LLL nodule (bx in 2012 benign), OSA on PAP, and h/o afib s/p cardioversion. No card download available. His last chip check was in 2017.    He has been using his device every night, still wakes up with the mask off and does not recall why.  Feels better overall using his CPAP. He is exercising daily (brisk walk/run outside).  Has been staying at home with daily run with COVID-19.?  Asthma meds and hx:  Singulair 10mg  daily, advair 500/50 and albuterol (1x/3-4 days)  Last hospital visit for asthma was several years ago, intubated about 52yrs ago for a severe attack  Last use of prednisone dose for asthma (prior to sinus infections) was several years ago      COMPLIANCE DATA: No new data today   Data Range:?02/05/2016 to 05/04/2016  Days with Device Usage:?86?days  Days without Device Usage: 4?days   % Days with Device Usage: 95.6?%   Average Usage (days used):?4?Hrs 49?Mins 37?Secs  % Days with Usage >?= 4 hours: 56.7?%   AHI (Apnea Hypopnea Index):?3.4  Auto CPAP Mean Pressure:?7.5?cmH2O  Auto CPAP Peak Average Pressure:??9.9?cmH2O  Average Device Pressure <= 90% of Time: ?9.1?cmH2O  Average Time in Large Leak:?0?Hrs 5?Mins 26?Secs       Medications that the patient states to be currently taking   Medication Sig   ? ADVAIR DISKUS 500-50 MCG/DOSE diskus USE 1 INHALATION TWICE A DAY   ? albuterol (VENTOLIN HFA) 90 mcg/act inhaler Inhale 2 puffs every four (4) hours as needed.   ? EPINEPHrine 0.3 mg/0.3 mL auto-injector Inject 0.3 mLs (0.3 mg total) into the muscle as needed for for Anaphylaxis.   ? MONTELUKAST 10 mg tablet TAKE 1 TABLET EVERY EVENING         PHYSICAL EXAM:  BP 119/74  ~ Pulse 55  ~ Temp 36.7 ?C (98 ?F) (Forehead)  ~ Resp 17  ~ Ht 5' 7.8'' (1.722 m)  ~ Wt 167 lb (75.8 kg)  ~ SpO2 98%  ~ BMI 25.54 kg/m?   General: Well-developed male, in no acute distress.   HEENT: Normocephalic and atraumatic  Pulm: CTAB  Neuro: Alert, oriented and appropriately interactive. No focal deficits. Speech fluent.   Psychological: normal affect    LABS:  Lab Results   Component Value Date    HGB 15.2 07/14/2018     No results found for: FERRITIN  Lab Results   Component Value Date    TSH 2.2 06/04/2011       IgE 1666 07/2018 (<-1422)  Imaging:  CT chest 2012  Unchanged biapical fibronodular pleural parenchymal scarring and calcified subcentimeter nodule within the right middle lobe (2-51),   consistent with prior granulomatous disease.?  Persistent diffuse peribronchial and peribronchiolar thickening with associated airway irregularity and occasional ectasia, consistent   with chronic asthma/bronchitis, with scattered bronchocentric bands and architectural distortion within both upper, right middle and left   greater than right lower lobes, consistent with postinflammatory pleural parenchymal scarring.  Near-complete resolution of mass-like consolidation within the left lower lobe  with residual architectural distortion (2-56). Several   fluctuating subcentimeter nodules within the left lower lobe and resolved within the right lower lobe since 05/28/2011, likely airway-related inflammatory and mucus impaction.  ?  Pathology:  LLL nodule 05/2011  LUNG NODULE, LEFT LOWER LOBE (BIOPSY):  - Acute and organizing pneumonia (see Comment)  - Prominent chronic inflammation in interstitium  - Gram stain is negative for bacterial organisms  - GMS stain is negative for fungal organisms  - AFB stain is negative for acid-fast bacilli  - No neoplasms identified  ?  PFT 07/2018 FVC 3.95 (94.8%),  FEV1  1.51 (48.7%)  DLCO 28.4 (100%)  PFT 09/2014:  FVC 4.04 (93.7%), post 4.436 (103.6%)  FEV1  1.29 (39.8%), post 1.67 (51.6%)  DLCO 100.7%  PFT 08/2010  FVC 4.80 (109%), post 4.99 (114%)  FEV 1.85 (55%), post 2.01 (60%)  ?    COMPLIANCE DATA: not available    ASSESSMENT: 56M w/ OSA on PAP therapy, Asthma (FEV1 48%), and prior ABPA who presents for follow up of PAP therapy.      Obstructive Sleep Apnea:  Continue with PAP therapy, the patient has acclimated well and is experiencing symptomatic improvement.  Mask fitting ordered  Will recheck compliance at next visit  The patient is willing to continue with PAP therapy.  COVID precautions for cpap given    Asthma  Well controlled  No change in therapy  Given dose of z pack, prednisone and epi pen previously in case of asthma exacerbation  Repeat PFTs and blood work prior to next visit    I discussed the plan in detail with the patient who voiced understanding and is in agreement.    >50% of the visit (20 minutes) was spent counseling and coordinating care.    Follow up: ~3 months, sooner if needed.      Maurice Ramirez     I saw and evaluated  Maurice Ramirez.  I discussed the case with the fellow and agree with the findings and plan of care as documented in the resident's note.       Maurice Ramirez

## 2019-07-27 NOTE — Patient Instructions
Wonderful meeting you today - No changes to your current therapies.  We will collect some blood work before your next visit.    Dr. Evelena Leyden will follow up with you in 3-4 months with PFTs prior to that visit.    Please bring your CPAP machine to your next visit

## 2019-07-29 ENCOUNTER — Ambulatory Visit: Payer: BLUE CROSS/BLUE SHIELD

## 2019-07-29 NOTE — Progress Notes
PATIENT: Maurice Ramirez  MRN: [id number]  DOB: May 04, 1951  DATE OF SERVICE: 07/29/2019    REFERRING PRACTITIONER: Lazaro Arms., MD  PRIMARY CARE PROVIDER: Babs Sciara, MD  PRIMARY SLEEP PROVIDER: Lazaro Arms., MD    Initial PAP Setup Appointment (1 hour)  Primary Complaint: Obstructive Sleep Apnea  DME: none  Name of Machine and Settings: DreamStation Auto by [employer] 7-12 cmH2O  RAMP: 4 / 30 min  FLEX/EPR: **  PRESSURE CHECK (yes/no):  Approximate Date of Receiving Machine: 2016 out of pocket  Name and Type of Mask(s): DreamWisp Medium NM Medium frame - fitted and given to pt  Does patient use/need chin strap? (yes/no): universal  What Type of Tube/Hose? (Standard/Heated Coil)  Is the PAP connected to the cloud? (Y/N)  Data Range:no data  Days with Device Usage: ** days    Days without Device Usage: ** days     % Days with Device Usage: ** %     Average Usage (days used): ** Hrs ** Mins ** Secs     % Days with Usage > = 4 hours: ** %     AHI (Apnea Hypopnea Index): **    Auto CPAP Mean Pressure: ** cmH2O    Auto CPAP Peak Average Pressure:  ** cmH2O    Average Device Pressure <= 90% of Time:  ** cmH2O    Average Time in Large Leak: ** Hrs ** Mins ** Secs     ____________________________________________________________________________  Overview of PAP Therapy/ Importance of PAP therapy (yes/no):y  Check condition of Filter, Humidifier and Tubing (yes/no):y  Instructions on how to clean and maintain equipment (yes/no):y  Check Mask for leaks and Adjust for comfort (yes/no):y  Fitting of Mask, give samples if appropriate (yes/no):y  Download SD card or Remote Cloud access (if data available):y    ____________________________________________________________________________  Explanation of Pressure Relief Function (EPR/+Flex):y  Check for Ramp setting and explanation of Ramp function (yes/no):y  Show how to look for usage data for personal tracking (Remote/Onboard):y ____________________________________________________________________________  Review the Patient?s experience with PAP to date (Note here of secondary complaint (ie, day time sleepiness, leaks, mask discomfort) :  Patient was encouraged to use PAP for 4 hours or more every night. Reminded patient to clean supplies regularly and change supplies when time to replace according to the list given by RT  Patient did not bring PAP or data card.  Patient wanted to be fit with a new NM and selected DreamWisp.  Gave pt sample  Review Optimal Sleep Hygiene and Desensitization Techniques (yes/no):y  Travel considerations (yes/no):y  Reminder to always bring Data Card to MD visit and Machine and Equipment for RT visit  (yes/no):y  Explanation of Equipment replacement Schedule, Compliance Rules, 30 days mask exchange, Rx for supplies (yes/no):y  Rx submitted (yes/no):n  Verify Patient understanding and Provide Action Plan/ Scheduling (yes/no):y  Does the Patient express that they will be able to use the PAP Consistently? (yes/no) :y    If Yes, Schedule regular F/U for MD/RT: 2 months    If No, Expedited MD F/U:      AuthorMarcha Solders WOO EDMONSON [name] 07/29/2019 10:35 AM

## 2019-08-04 ENCOUNTER — Telehealth: Payer: BLUE CROSS/BLUE SHIELD

## 2019-08-04 NOTE — Telephone Encounter
Appointment Accommodation Request    MD Name: Zeidler    Appointment Type: Return mask fitting    Reason for sooner request: pt. Was advised to return in 3 months.  However, pt. Is unsure if the appt needs to be with Dr. Rozanna Box or just the RT.     Date/Time Requested (If any): January 2021    Last seen by MD: 07/29/2019    Any Symptoms:  []  Yes  [x]  No      ? If yes, what symptoms are you experiencing:   o Duration of symptoms (how long):     Patient was offered an appointment but declined.    Patient was advised to seek emergency services if conditions are urgent or emergent.    Patient has been notified of the 24-48 hour turnaround time.

## 2019-08-11 NOTE — Telephone Encounter
Spoke to pt to advise that based on progress notes from 10/13 Dr.Zeidler recommends pt to return in 3 months with PFT and blood work prior to seeing her.     Pt is scheduled for PFT and follow up on 01/12.    He stated he will get labs done that day as well.     No further action is needed.

## 2019-08-23 DIAGNOSIS — Z23 Encounter for immunization: Secondary | ICD-10-CM | POA: Diagnosis not present

## 2019-08-23 DIAGNOSIS — Z6837 Body mass index (BMI) 37.0-37.9, adult: Secondary | ICD-10-CM | POA: Diagnosis not present

## 2019-08-23 DIAGNOSIS — J029 Acute pharyngitis, unspecified: Secondary | ICD-10-CM | POA: Diagnosis not present

## 2019-09-13 MED ORDER — ADVAIR DISKUS 500-50 MCG/DOSE IN AEPB
11 refills
Start: 2019-09-13 — End: ?

## 2019-09-14 DIAGNOSIS — C61 Malignant neoplasm of prostate: Secondary | ICD-10-CM | POA: Diagnosis not present

## 2019-09-14 MED ORDER — ADVAIR DISKUS 500-50 MCG/DOSE IN AEPB
11 refills | Status: AC
Start: 2019-09-14 — End: ?

## 2019-10-12 DIAGNOSIS — C61 Malignant neoplasm of prostate: Secondary | ICD-10-CM | POA: Diagnosis not present

## 2019-10-18 ENCOUNTER — Other Ambulatory Visit (HOSPITAL_COMMUNITY): Payer: Self-pay | Admitting: Urology

## 2019-10-18 ENCOUNTER — Other Ambulatory Visit: Payer: Self-pay | Admitting: Urology

## 2019-10-18 DIAGNOSIS — C61 Malignant neoplasm of prostate: Secondary | ICD-10-CM

## 2019-10-26 ENCOUNTER — Ambulatory Visit: Payer: BLUE CROSS/BLUE SHIELD

## 2019-10-26 ENCOUNTER — Telehealth: Payer: BLUE CROSS/BLUE SHIELD

## 2019-10-26 NOTE — Progress Notes
PULMONARY/SLEEP MEDICINE NOTE    PCP: Babs Sciara, MD      No chief complaint on file.      REFERRING PRACTITIONER: Babs Sciara, MD  PRIMARY CARE PROVIDER: Babs Sciara, MD  CHIEF COMPLAINT: follow up on OSA and asthma    History of Presenting Illness:  Maurice Ramirez?is a 69 y.o.?male?with  moderate persistent asthma, intermittent episodes of allergic bronchopulmonary aspergillosis , h/o LLL nodule (bx in 2012 benign), OSA on PAP, and h/o afib s/p cardioversion. No card download available. His last chip check was in 2017.    He has been using his device every night, still wakes up with the mask off and does not recall why.  Feels better overall using his CPAP. He is exercising daily (brisk walk/run outside).  Has been staying at home with daily run with COVID-19.?  Asthma meds and hx:  Singulair 10mg  daily, advair 500/50 BID and albuterol (1x/3-4 days)  Last hospital visit for asthma was several years ago, intubated about 85yrs ago for a severe attack  Last use of prednisone dose for asthma (prior to sinus infections) was several years ago      COMPLIANCE DATA: No new data today   Data Range:?02/05/2016 to 05/04/2016  Days with Device Usage:?86?days  Days without Device Usage: 4?days   % Days with Device Usage: 95.6?%   Average Usage (days used):?4?Hrs 49?Mins 37?Secs  % Days with Usage >?= 4 hours: 56.7?%   AHI (Apnea Hypopnea Index):?3.4  Auto CPAP Mean Pressure:?7.5?cmH2O  Auto CPAP Peak Average Pressure:??9.9?cmH2O  Average Device Pressure <= 90% of Time: ?9.1?cmH2O  Average Time in Large Leak:?0?Hrs 5?Mins 26?Secs       No outpatient medications have been marked as taking for the 10/26/19 encounter (Appointment) with Lazaro Arms., MD.         PHYSICAL EXAM:  There were no vitals taken for this visit.  General: Well-developed male, in no acute distress.   HEENT: Normocephalic and atraumatic  Pulm: CTAB Neuro: Alert, oriented and appropriately interactive. No focal deficits. Speech fluent.   Psychological: normal affect    LABS:  Lab Results   Component Value Date    HGB 15.2 07/14/2018     No results found for: FERRITIN  Lab Results   Component Value Date    TSH 2.2 06/04/2011       IgE 1666 07/2018 (<-1422)  Imaging:  CT chest 2012  Unchanged biapical fibronodular pleural parenchymal scarring and calcified subcentimeter nodule within the right middle lobe (2-51),   consistent with prior granulomatous disease.?  Persistent diffuse peribronchial and peribronchiolar thickening with associated airway irregularity and occasional ectasia, consistent   with chronic asthma/bronchitis, with scattered bronchocentric bands and architectural distortion within both upper, right middle and left   greater than right lower lobes, consistent with postinflammatory pleural parenchymal scarring.  Near-complete resolution of mass-like consolidation within the left lower lobe with residual architectural distortion (2-56). Several   fluctuating subcentimeter nodules within the left lower lobe and resolved within the right lower lobe since 05/28/2011, likely airway-related inflammatory and mucus impaction.  ?  Pathology:  LLL nodule 05/2011  LUNG NODULE, LEFT LOWER LOBE (BIOPSY):  - Acute and organizing pneumonia (see Comment)  - Prominent chronic inflammation in interstitium  - Gram stain is negative for bacterial organisms  - GMS stain is negative for fungal organisms  - AFB stain is negative for acid-fast bacilli  - No neoplasms identified  ?  PFT 07/2018  FVC 3.95 (94.8%),  FEV1  1.51 (48.7%)  DLCO 28.4 (100%)  PFT 09/2014:  FVC 4.04 (93.7%), post 4.436 (103.6%)  FEV1  1.29 (39.8%), post 1.67 (51.6%)  DLCO 100.7%  PFT 08/2010  FVC 4.80 (109%), post 4.99 (114%)  FEV 1.85 (55%), post 2.01 (60%)  ?    COMPLIANCE DATA: not available ASSESSMENT: 46M w/ OSA on PAP therapy, Asthma (FEV1 48%), and prior ABPA who presents for follow up of PAP therapy.      Obstructive Sleep Apnea:  Continue with PAP therapy, the patient has acclimated well and is experiencing symptomatic improvement.  Mask fitting ordered  Will recheck compliance at next visit  The patient is willing to continue with PAP therapy.  COVID precautions for cpap given    Asthma  Well controlled  No change in therapy  Given dose of z pack, prednisone and epi pen previously in case of asthma exacerbation  Repeat PFTs and blood work prior to next visit    I discussed the plan in detail with the patient who voiced understanding and is in agreement.    >50% of the visit (20 minutes) was spent counseling and coordinating care.    Follow up: ~3 months, sooner if needed.      Maurice Ramirez

## 2019-10-26 NOTE — Telephone Encounter
Appointment Accommodation Request    MD Name: Zeidler    Appointment Type: Return    Reason for sooner request: Pt is requesting to reschedule his PFT and follow up for same day.  [phone] Robin/pt assistant    Date/Time Requested (If any):     Last seen by MD:     Any Symptoms:  []  Yes  [x]  No      ? If yes, what symptoms are you experiencing:   o Duration of symptoms (how long):     Patient was offered an appointment but declined.    Patient was advised to seek emergency services if conditions are urgent or emergent.    Patient has been notified of the 24-48 hour turnaround time.

## 2019-11-02 ENCOUNTER — Telehealth: Payer: BLUE CROSS/BLUE SHIELD

## 2019-11-02 NOTE — Telephone Encounter
Called pt's assistant to the phone number provided below. She didn't answer, so I left a voicemail.    Requested for a call back to assist in coordinating PFT and follow up on the same day.

## 2019-11-02 NOTE — Telephone Encounter
.  PDL Call to Practice    Reason for Call: Pt. Called back to schedule appt.      MD:Dr.Zeidler    Appointment Related?  [x]  Yes  []  No     If yes;  Date: n/a  Time: n/a     Call warm transferred to PDL: []  Yes  [x]  No    Call Received by Practice Representative:Dorit

## 2019-11-02 NOTE — Telephone Encounter
Spoke to pt's assistant and scheduled pt for next available.

## 2019-11-04 ENCOUNTER — Encounter (HOSPITAL_COMMUNITY)
Admission: RE | Admit: 2019-11-04 | Discharge: 2019-11-04 | Disposition: A | Discharge status: Home / Self Care | Payer: Medicare Other | Source: Ambulatory Visit | Attending: Urology | Admitting: Urology

## 2019-11-04 ENCOUNTER — Ambulatory Visit (HOSPITAL_COMMUNITY)
Admission: RE | Admit: 2019-11-04 | Discharge: 2019-11-04 | Disposition: A | Discharge status: Home / Self Care | Payer: Medicare Other | Source: Ambulatory Visit | Attending: Urology | Admitting: Urology

## 2019-11-04 ENCOUNTER — Other Ambulatory Visit: Payer: Self-pay

## 2019-11-04 DIAGNOSIS — C61 Malignant neoplasm of prostate: Secondary | ICD-10-CM | POA: Diagnosis not present

## 2019-11-04 LAB — POCT I-STAT CREATININE: Creatinine, Ser: 1.2 mg/dL (ref 0.61–1.24)

## 2019-11-04 MED ORDER — TECHNETIUM TC 99M MEDRONATE IV KIT
20.0000 | PACK | Freq: Once | INTRAVENOUS | Status: AC | PRN
  Administered 2019-11-04: 21 via INTRAVENOUS

## 2019-11-04 MED ORDER — IOHEXOL 300 MG/ML  SOLN
100.0000 mL | Freq: Once | INTRAMUSCULAR | Status: AC | PRN
  Administered 2019-11-04: 100 mL via INTRAVENOUS

## 2019-11-08 DIAGNOSIS — N5201 Erectile dysfunction due to arterial insufficiency: Secondary | ICD-10-CM | POA: Diagnosis not present

## 2019-11-08 DIAGNOSIS — C61 Malignant neoplasm of prostate: Secondary | ICD-10-CM | POA: Diagnosis not present

## 2019-11-08 DIAGNOSIS — N3040 Irradiation cystitis without hematuria: Secondary | ICD-10-CM | POA: Diagnosis not present

## 2019-11-08 DIAGNOSIS — N281 Cyst of kidney, acquired: Secondary | ICD-10-CM | POA: Diagnosis not present

## 2019-11-08 DIAGNOSIS — C775 Secondary and unspecified malignant neoplasm of intrapelvic lymph nodes: Secondary | ICD-10-CM | POA: Diagnosis not present

## 2019-11-08 DIAGNOSIS — N393 Stress incontinence (female) (male): Secondary | ICD-10-CM | POA: Diagnosis not present

## 2019-11-11 DIAGNOSIS — M5030 Other cervical disc degeneration, unspecified cervical region: Secondary | ICD-10-CM | POA: Diagnosis not present

## 2019-11-11 DIAGNOSIS — I10 Essential (primary) hypertension: Secondary | ICD-10-CM | POA: Diagnosis not present

## 2019-11-11 DIAGNOSIS — J449 Chronic obstructive pulmonary disease, unspecified: Secondary | ICD-10-CM | POA: Diagnosis not present

## 2019-11-11 DIAGNOSIS — R7301 Impaired fasting glucose: Secondary | ICD-10-CM | POA: Diagnosis not present

## 2019-11-17 ENCOUNTER — Telehealth: Payer: BLUE CROSS/BLUE SHIELD

## 2019-11-17 NOTE — Telephone Encounter
Call Back Request    MD:  Dr. Evelena Leyden     Reason for call back: [facility] called and advised that they received a Rx for the [facility] inhaler on 11/12/2019. They stated that per insurance's formulary list this is covered by them. They stated that they want to change the inhaler to an albuterol HFA inhaler. Please contact [facility] back at [phone] with ref# [id number]    Any Symptoms:  []  Yes  [x]  No      ? If yes, what symptoms are you experiencing:    o Duration of symptoms (how long):    o Have you taken medication for symptoms (OTC or Rx):      Patient or caller has been notified of the 24-48 hour turnaround time.

## 2019-11-22 ENCOUNTER — Ambulatory Visit: Payer: BLUE CROSS/BLUE SHIELD

## 2019-11-22 DIAGNOSIS — Z23 Encounter for immunization: Secondary | ICD-10-CM

## 2019-11-23 DIAGNOSIS — Z5111 Encounter for antineoplastic chemotherapy: Secondary | ICD-10-CM | POA: Diagnosis not present

## 2019-11-23 DIAGNOSIS — C61 Malignant neoplasm of prostate: Secondary | ICD-10-CM | POA: Diagnosis not present

## 2019-11-23 NOTE — Telephone Encounter
Hi Dr. Evelena Leyden,    [facility] would like to know if it is ok to change RX for ventolin to an alternate medication covered by patient's plan, albuterol HFA.    Please advise.    Thanks!    Ayleah Hofmeister P.

## 2019-11-25 DIAGNOSIS — I10 Essential (primary) hypertension: Secondary | ICD-10-CM | POA: Diagnosis not present

## 2019-11-25 NOTE — Telephone Encounter
Reply by: Lazaro Arms      Yes please

## 2019-12-17 DIAGNOSIS — F17200 Nicotine dependence, unspecified, uncomplicated: Secondary | ICD-10-CM | POA: Diagnosis not present

## 2019-12-17 DIAGNOSIS — I10 Essential (primary) hypertension: Secondary | ICD-10-CM | POA: Diagnosis not present

## 2019-12-17 DIAGNOSIS — Z6838 Body mass index (BMI) 38.0-38.9, adult: Secondary | ICD-10-CM | POA: Diagnosis not present

## 2019-12-17 DIAGNOSIS — R351 Nocturia: Secondary | ICD-10-CM | POA: Diagnosis not present

## 2019-12-28 ENCOUNTER — Inpatient Hospital Stay: Payer: BLUE CROSS/BLUE SHIELD

## 2019-12-28 ENCOUNTER — Ambulatory Visit: Payer: BLUE CROSS/BLUE SHIELD

## 2019-12-28 DIAGNOSIS — G4733 Obstructive sleep apnea (adult) (pediatric): Secondary | ICD-10-CM

## 2019-12-28 DIAGNOSIS — J45909 Unspecified asthma, uncomplicated: Secondary | ICD-10-CM

## 2019-12-28 LAB — COOXIMETRY,POC: OXYHEMOGLOBIN,POC: 33.4 (ref 13.5–17.1)

## 2019-12-28 NOTE — Patient Instructions
SLEEP APNEA EDUCATION  What Are Snoring and Sleep Apnea?  What is sleep apnea? ? Sleep apnea is a condition that makes you stop breathing for short periods while you are asleep. There are 2 types of sleep apnea. One is called ''obstructive sleep apnea,'' and the other is called ''central sleep apnea.''  In obstructive sleep apnea, you stop breathing because your throat narrows or closes (see figure). In central sleep apnea, you stop breathing because your brain does not send the right signals to your muscles to make you breathe. When people talk about sleep apnea, they are usually referring to obstructive sleep apnea, which is what this article is about.    People with sleep apnea do not know that they stop breathing when they are asleep. But they do sometimes wake up startled or gasping for breath. They also often hear from loved ones that they snore.    What are the symptoms of sleep apnea? ? The main symptoms of sleep apnea are loud snoring, tiredness, and daytime sleepiness. Other symptoms can include:    ?Restless sleep  ?Waking up choking or gasping  ?Morning headaches, dry mouth, or sore throat  ?Waking up often to urinate  ?Waking up feeling unrested or groggy  ?Trouble thinking clearly or remembering things  Some people with sleep apnea don't have symptoms, or they don't know they have them. They might figure that it's normal to be tired or to snore a lot.    Should I see a doctor or nurse?   ? Yes. If you think you might have sleep apnea, see your doctor.    Is there a test for sleep apnea?   ? Yes. If your doctor or nurse suspects you have sleep apnea, he or she might send you for a ''sleep study.'' Sleep studies can sometimes be done at home, but they are usually done in a sleep lab. For the study, you spend the night in the lab, and you are hooked up to different machines that monitor your heart rate, breathing, and other body functions. The results of the test will tell your doctor or nurse if you have the disorder.    Is there anything I can do on my own to help my sleep apnea? ? Yes. Here are some things that might help:  ?Stay off your back when sleeping. (This is not always practical, because people cannot control their position while asleep. Plus, it only helps some people.)  ?Lose weight, if you are overweight  ?Avoid alcohol, because it can make sleep apnea worse  How is sleep apnea treated?   ? As mentioned above, weight loss can help if you are overweight or obese. But losing weight can be challenging, and it takes time to lose enough weight to help with your sleep apnea. Most people need other treatment while they work on losing weight.  The most effective treatment for sleep apnea is a device that keeps your airway open while you sleep. Treatment with this device is called ''continuous positive airway pressure,'' or CPAP. People getting CPAP wear a face mask at night that keeps them breathing (figure 2).  If your doctor or nurse recommends a CPAP machine, try to be patient about using it. The mask might seem uncomfortable to wear at first, and the machine might seem noisy, but using the machine can really pay off. People with sleep apnea who use a CPAP machine feel more rested and generally feel better.  There is also another device  that you wear in your mouth called an ''oral appliance'' or ''mandibular advancement device.'' It also helps keep your airway open while you sleep.  In rare cases, when nothing else helps, doctors recommend surgery to keep the airway open. Surgery to do this is not always effective, and even when it is, the problem can come back.  Is sleep apnea dangerous?   ? It can be. People with sleep apnea do not get good-quality sleep, so they are often tired and not alert. This puts them at risk for car accidents and other types of accidents. Plus, studies show that people with sleep apnea are more likely than others to have high blood pressure, heart attacks, and other serious heart problems. In people with severe sleep apnea, getting treated (for example, with a CPAP machine) can help prevent some of these problems.  Airway in a person with sleep apnea  Normally when a person sleeps, the airway remains open, and air can pass from the nose and mouth to the lungs. In a person with sleep apnea, parts of the throat and mouth drop into the airway and block off the flow of air. This can cause loud snoring and interrupt breathing for short periods.  Sleep Apnea (also called ?Obstructive Sleep Apnea?) is a condition where there are long pauses between breaths during sleep. This usually occurs when the tissues and muscles in the back of the throat relax too much during sleep. This causes the air passage in your throat to narrow or block off completely. Breathing slows down or stops completely and you then wake up, take a few good breaths and fall back to sleep again. There may be 10-60 such awakenings during a night. This prevents you from getting to the deeper stages of sleep that are needed for the body to rest and recover its strength.  During sleep, loud snoring, noisy breathing or gasping sounds are common. The sleep disturbance causes daytime symptoms such as difficulty getting up in the morning, need for daytime naps, irritability, poor concentration and attention.  Causes Of Sleep Apnea   ? The main risk factor for this kind of sleep apnea is excessive weight gain. Fatty tissue gathers along the sides of the throat causing the air passage to be more narrow than normal.  ? Increasing age is another factor due to softening of the air passage.  ? Certain neck and jaw shapes are prone to a narrow air passage (such as receding chin)  ? Enlarged tonsils and adenoids may block the air passage when lying down  ? Severe nasal congestion  ? Use of alcohol or sedatives relaxes the muscles in the throat.  ? Smoking can cause inflammation and swelling in the upper air passages and make this problem worse.  Home Care ? Sleep with your head and neck in a straight or neutral position. If the neck falls back or forward too far this can block breathing.  ? Limit the use of alcohol and sedatives in the evening.  Follow Up  with your doctor as advised. If you are overweight, talk to your doctor about a weight loss program. If you smoke, talk to your doctor about ways to help you stop.  Get Prompt Medical Attention  if any of the following occur:  ? Longer pauses than usual  ? Unable to awaken  ? Seizure  ? [address] [employer] [address] [place], [place] [address]. All rights reserved. This information is not intended as a  substitute for professional medical care. Always follow your healthcare professional's instructions.      Continuous Positive Air Pressure (CPAP)  Continuous positive air pressure (CPAP) uses gentle air pressure to hold the airway open. CPAP is often the most effective treatment for sleep apnea and severe snoring. It works very well for many people. But keep in mind that it can take several adjustments before the setup is right for you.    How CPAP works  The CPAPmachine  is a small portable pump beside the bed. The pump sends air through a hose, which is held over your nose and mouth by a mask. Mild air pressure is gently pushed through your airway. The air pressure nudges sagging tissues aside. This widens the airway so you can breathe better. CPAP may be combined with other kinds of therapy for sleep apnea.     A mask over the nose gently directs air into the throat to keep the airway open.   Types of air pressure treatments  There are different types of CPAP. Your doctor or CPAP technician will help you decide which type is best for you:  ? Basic CPAP keeps the pressure constant all night long.  ? A bilevel device (BiPAP) provides more pressure when you breathe in and less when you breathe out. A BiPAP machine also may be set to provide automatic breaths to maintain breathing if you stop breathing while sleeping.  ? An autoCPAP device automatically adjusts pressure throughout the night and in response to changes such as body position, sleep stage, and snoring.  Date Last Reviewed: 05/23/2014  ? 2000-2017 [employer] [address] [place], [place] [address]. All rights reserved. This information is not intended as a substitute for professional medical care. Always follow your healthcare professional's instructions.        Continuous Positive Airway Pressure (CPAP)  Your healthcare provider has prescribed continuous positive airway pressure (CPAP) therapy for you. A CPAP device helps you breathe better at night. The device sends air through your nose or mouth when you breathe in to keep your air passages open. CPAP is:  ? Used most often to treat sleep apnea and some other problems. (Sleep apnea is a chronic condition with periods of sleep in which you briefly stop breathing.)  ? Safe and very effective. But it takes time to get used to the mask.  Your healthcare provider, nurse, or medical supplier will give you tips for wearing and caring for your CPAP device.  General guidelines  Recommendations include the following:  ? It's very important not to give up! It takes time to get used to wearing the mask at night.  ? Practice using your CPAP device during the day, especially whenever you take a nap.  ? Remember, there are several different types of masks. If you can?t get used to your mask, ask your provider or medical supply company about trying another style.  ? If you have nasal stuffiness or dryness when using your CPAP device, talk with your provider or medical supply company. There are ways to ease these problems. For example, your provider may recommend using a moistening nasal spray. Or the medical supply company may recommend a device with a humidifier.  ? The goal is to use your CPAP all night, every night, during all naps, and even when you travel.  ? Keep your mask clean. Wash it with soap and water. Be sure to rinse the mask and tubing well with water  to remove any soap. Let them air-dry completely before using.  ? Make yourself comfortable when sleeping with CPAP. Try using extra pillows.  Work with your medical supply company so that you know how to correctly use your CPAP. The company's representative will be able to help you:  ? Use the CPAP correctly  ? Troubleshoot any problems that come up  ? Learn to clean and maintain the device  ? Adjust to regular use of the CPAP     The CPAP device settings are given as centimeters of water, or cm/H2O. Each person?s pressure settings are different. Your healthcare provider will tell you what settings to use. Never change your CPAP pressure setting unless your provider tells you to.  CPAP ____________cm/H20 pressure when you breathe in   Date Last Reviewed: 02/12/2015  ? 2000-2017 [employer] [address], [place], [place] [address]. All rights reserved. This information is not intended as a substitute for professional medical care. Always follow your healthcare professional's instructions.        Surgical Treatment for Snoring and Sleep Apnea  The goal of most surgeries for breathing problems is to widen the airway. This is done by taking out or shrinking excess tissue where the mouth meets the throat. Nasal and jaw surgery can help correct nose or jaw problems that contribute to snoring and apnea. This sheet describes procedures that may be recommended for you.  UPPP (Uvulopalatopharyngoplasty)      This is the most common surgical procedure for sleep apnea. It trims the soft palate and uvula, and removes the tonsils and other tissue. It is major surgery performed in a hospital. Most patients go home within 24 hours.  Risks and Complications of UPPP  Complications are uncommon with this procedure, but can include:  ? Bleeding  ? Throat pain  ? Scarring ? Nasal-sounding speech  ? False feeling that something is in throat  ? Liquids sometimes going into nose when swallowing   LAUP (Laser-Assisted Uvulopalatoplasty)  This procedure helps relieve snoring. It may also be used in some cases of mild apnea. The doctor uses a laser or electric current to remove some of the soft palate and part or all of the uvula. This treatment may be done over several sessions in the doctor?s office.  Risks and Complications of LAUP  The risks and complications are the same as for UPPP, but even less likely to occur.  RFA (Radiofrequency Ablation)  This procedure helps relieve snoring. The doctor uses radio waves to reduce the size of the turbinates or uvula, nearby tissue, and sometimes the back of the tongue.  Risks and Complications of RFA  ? Mouth ulcer  ? Nerve pain ? Swelling in airway  ? Pocket of pus (abscess) on tongue   Nasal Surgery      Problems in the nose can make snoring or sleep apnea worse. They can also make CPAP (a common treatment for snoring and sleep apnea) harder to use. If blockages in your nose are severe, surgery can improve the airflow. It can reduce the size of the turbinates, straighten a deviated septum, and remove any polyps (overgrowths of sinus lining).  Risks and Complications of Nasal Surgery  ? Bruising  ? Bleeding  ? Damage to or perforation of septum  ? Dryness in nose  Jaw Surgery      If your jaw sits too far back, your tongue may also be too far back. That makes the tongue more likely  to block the airway when you sleep. Moving the jaw forward moves the tongue forward and widens the airway overall.  Risks and Complications of Jaw Surgery  In some cases, the jaw does not heal in the desired position. Your doctor can tell you more about this. Other possible complications include:  ? Loss of teeth or need for orthodontic treatment to realign teeth.  ? Loss of feeling in jaw or teeth.  ? Change in facial appearance.  More Severe Cases  If your apnea is severe and no other treatment helps, other kinds of surgery may help. Your doctor can tell you about them. Be sure you understand their risks as well as their benefits.  ? 2000-2015 [employer] [address], [place], [place] [address]. All rights reserved. This information is not intended as a substitute for professional medical care. Always follow your healthcare professional's instructions.      TESTING FOR SLEEP APNEA    What Is a Sleep Study?  Do you often have problems sleeping? Do you feel tired most days of the week? Talk to your health care provider or a sleep specialist. He or she may suggest that you have a sleep study. It can help diagnose a sleep disorder such as sleep apnea or narcolepsy. During the study, a special machine is used to monitor your sleep.      Who Needs a Sleep Study?  If you have sleep problems that last longer than a few weeks, you may need a sleep study. Talk to your health care provider. Be prepared to answer questions about your health history. Try to keep a daily sleep diary for a week or 2. Write down the time you go to bed, the time you wake up, and anything that seems to affect your sleep. Then your health care provider can refer you to a sleep specialist and recommend a sleep study.  Monitoring Your Sleep  Your sleep can be monitored at a sleep clinic or at your home. In either case, your health care provider will discuss the results with you at a future visit:  ? At a sleep clinic. Most sleep studies are done at a sleep clinic or a sleep lab. In many cases, you will need to stay overnight. You will sleep in a private room, much like a hotel or hospital room. A family member or a friend can come along, but cannot stay overnight. Most people don?t have trouble sleeping during the study. In the morning you can go home. Sometimes you may be asked to come back for a daytime nap study.  ? At home. At times, a sleep study can be done at home. A home sleep study provides most of the same information as a study done at a clinic. A special computer is loaned to you by a sleep clinic or a medical supplier. You will be given instructions on how to use it. Or, someone may come to your home to help. Before bedtime, the computer is turned on to monitor your sleep all night. In the morning, you return the computer.  ? 2000-2015 [employer] [address] [place], [place] [address]. All rights reserved. This information is not intended as a substitute for professional medical care. Always follow your healthcare professional's instructions.        Visiting a Sleep Clinic  Are you worried about having a sleep study? Talk to your health care provider if you have any concerns. Learn what to  expect at the sleep clinic and try to relax before you come.     Before Your Study  Your health care provider will tell you how to prepare. Ask if you should take your usual medications. Also:  ? Avoid napping.  ? Avoid caffeine and alcohol.  ? Take a shower and wash your hair (don?t use hair conditioner, hair spray, and skin lotions).  ? Eat dinner before you come to the sleep clinic. Pack a snack if you need one before bedtime.  ? Bring what will make you comfortable, such as your pajamas, robe, slippers, hygiene items, and even your own pillow.  What You Can Expect  When you arrive at the sleep clinic, you will usually be checked in by a receptionist. A specialized sleep technologist will meet you in your room. Then you may change into your nightclothes. Small sensors are placed on your head and body with tape and gel. The sensors are then plugged into a machine that will monitor your sleep. If you need to use a restroom, the sensors can be unplugged. A camera in your room will record your body movements. The technologist will stay in a nearby room. If you need to talk to him or her, use the intercom.  What a Sleep Study Does  A sleep study monitors all the stages of your sleep. To do this, the following are recorded:  ? Eye movements  ? Heart rate, brain waves, and muscle activity  ? Level of oxygen in your blood  ? Breathing and snoring  ? Sudden leg or body movements  If you have breathing problems, Continuous Positive Airway Pressure (CPAP) may be used. CPAP is a device that can help you breathe by adding mild air pressure to your airway passages and improve your sleep. It may be used during the second half of your study or on another night.  Getting Your Results  The technologist can answer some of your questions about the sleep study. But only your health care provider can explain the results. He or she will have the report of your sleep study within 1 to 2 weeks. Then your treatment options can be discussed with your health care provider, or you may be referred to a sleep disorders specialist.  ? [address] [employer] [address] [place], [place] [address]. All rights reserved. This information is not intended as a substitute for professional medical care. Always follow your healthcare professional's instructions.      Learn about Obstructive Sleep Apnea (OSA), [employer] [facility]:    Obstructive Sleep Apnea (OSA) is a disease during sleep in which the breathing stops and starts. This is due to repeated collapse (blockage) of the airway when the throat muscles relax during sleep. It frequently causes snoring and choking/gasping for air during sleep and results in poor sleep quality and daytime sleepiness.    Key Facts:    OSA is a common condition in the community and its risk increases with increasing body weight  Due to frequent awakenings during sleep, patients usually feel sleepy during the day and may have issues with alertness, memory, concentration, and mood (irritability)  OSA is thought to be associated with developing or worsening of heart disease and other major medical problems  What is OSA?    OSA is a condition characterized by narrowing or closure (obstructions) of your airway during sleep that results in a stoppage of airflow called apneas. This obstruction occurs in the back of the throat, in  a section of the airway called the?pharynx?Mar[place] Kitchen These events happen repeatedly throughout the night and frequently patients with this disorder are not even aware of them. These recurrent awakenings result in very disrupted sleep. Patients often wake up in the morning feeling like they haven?t slept well or that they haven?t slept at all.    Obstructive sleep apnea isn?t the only type of sleep apnea that a person can suffer from, although it is by far the most common type. Central sleep apnea, the other major type of sleep apnea, occurs when a person?s brain does not tell the body to breathe repeatedly through the night. A sleep study can easily determine which type of sleep apnea is present.    How OSA Affects Your Body    OSA can have many effects on your health and well-being. Because an individual with OSA wakes up frequently during the night, they frequently complain of poor sleep quality and daytime sleepiness. This is because even though someone with OSA may spend the suggested 7-9 hours asleep per night, if they are waking up frequently, they may feel as if they haven?t slept at all. In addition, being deprived of normal amounts of sleep due to OSA will result in many next day complaints in addition to sleepiness: impaired alertness (daytime fatigue), poor concentration, impaired memory, and mood instability (irritability). In children with OSA, instead of getting sleepy, kids often become hyperactive.    OSA can result in or worsen other chronic health conditions. OSA is associated with high blood pressure, stroke, coronary artery disease, congestive heart failure, cardiac rhythm disturbances, and difficulty controlling blood sugar. Because of this, adequate treatment of OSA, especially severe OSA is essential.    How Serious is OSA?    OSA is a common disorder that is increasing in prevalence. In the [place], OSA is thought to be present in approximately 10-30% of adults. The most worrisome effects of OSA include impaired functioning at work or while driving and its association with developing heart and vascular disease. Motor vehicles accidents are at least twice as likely to occur in people with OSA compared to those without OSA. Patients with severe untreated OSA have 2-3 times the risk of fatal or non-fatal cardiovascular events (such as heart attack and stroke)    If you think you might have sleep apnea, see your doctor. Treatment can ease your complaints and may help prevent heart problems or other complications from occurring.    Obstructive Sleep Apnea (OSA) Manifestations, Causes, and Risk Factors    Sleep apnea is a condition of repeated interruptions of breathing during sleep that results in poor sleep quality and daytime complaints. The causes of sleep apnea and conditions that put people at risk for sleep apnea are well established.    What Are Manifestations related to OSA?    Different people have different complaints related to sleep apnea. While some people have significant manifestations of the disorder, others have minimal complaints. This is why a sleep study is needed to diagnose sleep apnea.    Common complaints related to sleep apnea include:    Snoring: snoring is a very common symptom of sleep apnea. Most people with sleep apnea snore, although not everyone who snores has sleep apnea.  Daytime sleepiness: because sleep apnea results in poor sleep quality, people with sleep apnea are often very sleepy during the day and may doze off or take daytime naps. Car accidents may also occur in patients with sleep apnea due to falling  asleep at the wheel.  Pauses in breathing followed by abrupt awakenings with snoring, gasping, or choking: this symptom may be observed by the bed partner  Difficulties with memory and concentration  Changes in mood or irritability  Frequent urination at night  Morning headaches and dry mouth What Causes OSA?    Sleep apnea results from the collapse of the back of the throat (the pharynx) during sleep. Because the pharynx has no supporting structures (bone or cartilage) holding it open, it instead relies on muscles to keep the breathing passage open; however, when the muscles relax during sleep, the pharynx collapses on itself, resulting in a decrease or complete absence of air entering and exiting the lungs. In order to unblock the pharynx, a patient with sleep apnea awakens briefly to ?flex? the pharynx muscles in order to start breathing again. These events happen repeatedly throughout the night, and frequently patients with this disorder are not even aware of them.    Anything that makes the pharynx smaller will result in more sleep apnea. This includes having a big tongue, having big tonsils or adenoids (glands in the pharynx) especially in children, being overweight or obese, and/or having a small chin or other ?anatomic? problems.    What Are Risk Factors?    Overweight/obesity: this is the most important risk factor for sleep apnea, although even normal weight people can have sleep apnea. Obesity increases a person?s risk for sleep apnea because fatty tissue present in the breathing passage results in a smaller tube through which air can pass, making it easier for the breathing passage to collapse during sleep.  Gender: men have a 2-3 times higher risk of sleep apnea compared with premenopausal women. Postmenopausal women have a similar risk for OSA as men.  Age: adult sleep apnea becomes more frequent as a person ages, starting in young adulthood until the 59s and 56s. After this time, the risk of sleep apnea appears to level off.  Upper airway (pharynx) crowding: People with small chins, large tongues, or enlarged tonsils and adenoids have a higher risk of developing sleep apnea.  When to See Your Doctor    There are several reasons to be evaluated for sleep apnea:    Loud, disruptive snoring: your snoring is disrupting your sleep and/or your bed partner or family members complain of loud snoring that is disrupting their sleep.  Observed pauses in breathing during sleep: your bed partner is concerned that you stop breathing at night  Daytime sleepiness: you are having difficulty staying awake during the day. This is especially important if your sleepiness is affecting your work or putting you in danger (falling asleep while driving).  Difficult to control medical problems: often, patients with sleep apnea have a more difficult time controlling their blood pressure and/or blood sugar. If you have high blood pressure, diabetes, or other medical conditions that are not well-controlled, it might be due to sleep apnea.  Diagnosing and Treating OSA    If you or your partner suspects that you have sleep apnea, you should contact your doctor to discuss further testing and treatment. Often this will include a referral to a sleep specialist.    What to Expect    When you see a sleep specialist, they will perform a thorough history and physical examination. They may ask questions about your family history of sleep problems as sleep apnea can run in your family. They will carefully review all of the medicines you are currently taking to see how  they might affect your sleep. They will go over all of the potential sleep related complaints and evaluate for crowding in the back of your nose and throat. They will likely ask about details of your sleep and work schedule to determine if you are getting adequate sleep and have good sleep habits. They will also likely assess you for manifestations of other sleep problems by asking questions about how long it takes to fall asleep, whether or not you have movements during your sleep or sleep walk or sleep talk. If your doctors feel that you have significant risk factors for OSA they may order a diagnostic test that they feel is most appropriate for you.    How It?s Diagnosed OSA is formally diagnosed with either an in laboratory sleep test (polysomnogram [PSG]) or a portable sleep apnea test that can be done at home (Out of Center Testing [OCST]). The most commonly done test is the HSAT . If a laboratory sleep test is ordered by your doctor, you are hooked up to equipment that monitors your heart, lung and brain activity, breathing patterns, arm and leg movements, and oxygen levels while you sleep. If you stop breathing during sleep, you may be diagnosed with sleep apnea. This test can determine the severity of your OSA based on how many times you stop breathing per hour of sleep.    HSAT can often be done at home. You will be taught how to put the device on by yourself. These tests typically collect less data than a laboratory sleep test (PSG), and only focus on the information required to see if you stop breathing.HSAT can more accurately diagnose severe obstructive sleep apnea than milder OSA and may in fact miss mild sleep apnea. Therefore, if you have many risk factors and complaints of OSA and a HSAT does not show OSA, you may still need to confirm this with a PSG.    How It?s Treated    The treatment of OSA generally falls into one of four categories below. Regardless of the treatment you choose, a follow up sleep study to ensure your OSA is treated adequately is recommended.    Continuous Positive Airway Pressure (CPAP). CPAP is a machine that gently blows air into the airway to keep it open when breathing during sleep. It uses a mask that can either fit into the nostrils, over the nose and/or mouth. CPAP is highly effective when used and is typically offered as the first line of treatment. CPAP should be used on a nightly basis for optimal treatment.    Oral Appliances (OA?s). OA?s are a treatment option for mild to moderate OSA or if you are intolerant to CPAP. These are dental appliances designed to open your throat by bringing your jaw forward when sleeping.    Surgery. If intolerant to other treatment options, you might consider surgical options. Surgeries for sleep apnea include reducing the tissue in the back of the throat (back part of the roof of the mouth, tonsils, and uvula moving the upper jaw (maxilla) and/or lower jaw (mandible) forward (Maxillomandibular advancement surgery), and placement of a nerve stimulator to open the airway when breathing during sleep.     These last two options are only recommended to severe OSA after exhausting every other less invasive treatment, including CPAP therapy.  Lifestyle Changes    Lifestyle changes may be done in combination with the other forms of treatment for sleep apnea.    Weight Loss. If you lose weight, it will  improve your OSA. Losing just 10% of your body weight can improve your sleep apnea. In some cases, losing a significant amount of weight can even cure the disease.  Avoid alcohol and certain medications. Drinking excessive alcohol or taking some pain medicines or sleeping pills before bedtime can worsen OSA.  Quit smoking. Cigarette smoking can increase swelling in your airway making snoring and OSA worse.  Positional Therapy. Some patients have worse sleep apnea when lying on their back. Avoiding this position when sleeping can help reduce sleep related complaints.  Living with Obstructive Sleep Apnea (OSA)    Living with untreated sleep apnea may lead to hypertension, heart disease and stroke as well as other medical disorders. It is important to seek out treatment. It is imperative to use Continuous Positive Airway Pressure (CPAP) or oral appliances (OA) every time you sleep. These are considered a treatment for OSA, not a cure, so continued use is needed.    What to Expect    There is often an initial adjustment period when getting used to nightly CPAP use as sleeping with a mask on is a new experience for most. If you are having persistent difficulty tolerating CPAP, it is important to contact your doctor. Treating OSA typically improves many of the complaints associated with the disease.    While some complaints, such as snoring, will improve the first night you are on treatment, other complaints may take longer to fully resolve. Daytime sleepiness and memory recall have greater improvement when CPAP is used for the entire night rather than taking it off part way through the night. These complaints may continue to improve over several weeks to months of use. Morning headaches, dry mouth, and difficulty waking up in the morning improve once OSA is treated. Patients often feel more alert during the day and are less likely to take naps.    If you have persistent complaints after being fully treated, you should discuss this with your doctor. Sometimes, this may be due to other sleep or medical disorders, medications or lack of enough total sleep.  Learn more about managing the disease and finding support ?    Managing the Disease    Management of OSA has two components. Ensuring the treatment is working properly and working on lifestyle modifications.    CPAP supplies need to be kept clean and replaced regularly. Many CPAP machines have the ability to determine how well therapy is working. Seeing a doctor once a year to review the information on your machine can help keep you healthy and ensure everything is working properly. Repeat sleep studies may be needed if there has been a significant change in weight or surgery on your airway. OA?s need to be kept clean. If using an OA, it is important to follow with both a sleep specialist as well as a dentist. These have the potential to cause misalignment of the teeth so close follow up is important. If surgery is done to treat OSA, it is important have a follow up sleep study to ensure that OSA is resolved. Over time, scar tissue or relaxation of the muscles can cause OSA to return, so discussing sleep complaints with your doctor each year is important. If complaints of OSA start to return, a repeat evaluation may be needed.    Ensuring you lead a healthy lifestyle will improve sleep apnea. Being overweight is strongly associated with OSA. Weight loss can improve or even cure sleep apnea. Getting a good night of sleep on  a regular basis is also important as sleep deprivation can worsen sleep apnea. Finally, avoiding tobacco which can irritate the airway and avoiding alcohol prior to going to sleep are important lifestyle changes.    Finding Support    Finding support is important with any disorder. Ask your doctor about any local support groups in your area. Educate your family and friends about OSA so that they can better support you. Additional resources can be found below:    [employer]  American Sleep Apnea Association  UpToDate    Questions to ask your doctor about obstructive sleep apnea (OSA):    In preparation for your appointment with your health-care provider or sleep specialist, we recommend that you write down ahead of time a list of questions about obstructive sleep apnea. These are some questions for you to ask:    What is the most likely explanation on how I feel and my complaints?  Other than obstructive sleep apnea, are there other explanations about the way I feel?  What is obstructive sleep apnea?  Why do I have obstructive sleep apnea?  Is it temporary or permanent?  What test do I need to do to confirm obstructive sleep apnea?  How do I prepare for this test?  What are my treatment options?  Which treatment is best for me?  Are all treatment options equally effective to treat my obstructive sleep apnea?  What is a ?CPAP? machine?  What is an ?oral appliance??  What do I have to change in my life to improve my obstructive sleep apnea?  What are the complications and risks if obstructive sleep apnea is not treated?  How will treatment help me?  For more information, do you have any printed material or a website address that you could provide?  Please consider to ask additional questions during your appointment.    Questions that your doctor may ask you during the appointment:    Your health-care provider or sleep health specialist will ask you questions about your health and medical history, so this information will help you to be ready for your appointment:    Make a list of your sleep and non-sleep related complaints.  Request your spouse, significant other, or closest friend to come along, as they may provide valuable additional information about your sleep.  Bring a complete list of all prescription, over-the-counter medications and supplements that you are taking.  Bring copies of your medical history, including medical records and sleep tests performed at another clinician?s office. You may ask other doctor?s offices to fax/mail this information to your current health-care provider in advance of your visit.  Be aware of any pre-appointment requirements. At the time you make the appointment, please ask if there?s a sleep questionnaire or a sleep diary to fill out in advance.  Please consider bringing any other additional medical information or documentation to your appointment.        Normal Breathing During Sleep  When you breathe, air travels through passages in your nose and throat. When these air passages are wide enough to let air flow freely, you breathe normally.       Front view of nose and mouth.   Side view of nose and mouth. During normal sleep, air flows freely past the structures in the throat.   Nasal structures  The septum is the wall that divides the left half of the nose from the right half. Turbinates are ridges in the nasal passage. The turbinates help  warm and moisturize air as it flows through the nose.  Throat structures  Air flows past soft, flexible structures where the mouth meets the throat: the soft palate, uvula, tonsils, and back of the tongue. Throat muscles hold these structures in place. While you sleep, the throat muscles relax a bit. But they normally stay tight enough to keep the airway open.  Date Last Reviewed: 05/14/2016  ? 2000-2018 [employer], [place]. [address] [place], [place] [address]. All rights reserved. This information is not intended as a substitute for professional medical care. Always follow your healthcare professional's instructions.          Tips to Help Prevent Snoring    Your snoring may get better if you make a few simple changes in your sleeping and waking habits. These changes might be all you need to improve or even cure your snoring, or they may work best when used along with other types of treatment.  Sleep on your side  Sleeping on your side may keep throat tissue from blocking your air passage. This may improve or even cure snoring. But it can be hard to stop sleeping on your back. Try sewing a pocket or sock onto the back of a T-shirt or pajama top. Put a few tennis balls or a bag of unshelled nuts into this pocket or sock, then wear the shirt to bed. This will help keep you from rolling onto your back. If this doesn't work, try wearing a backpack full of foam pieces. Or put a wedge-shaped pillow behind you. You can also buy devices online that will help you stay off your back during sleep.  Don't use alcohol or certain medicines  Alcohol and medicines such as sedatives and sleeping pills can make breathing slower and more shallow. They also make your muscles relax, so structures in your throat can block your air passage. These changes can cause or worsen snoring. If you snore, don?t use alcohol. Talk with your healthcare provider if you take medicines to help you sleep.  Lose weight  Too much weight can make snoring worse. Extra weight puts pressure on your neck tissues and lungs, making breathing harder. If you're overweight, ask your healthcare provider about a weight-loss program.  Exercise regularly  Exercise can help you lose weight, tone your muscles, and make your lungs work better. These changes may help improve your snoring. Ask your healthcare provider about an exercise program like walking, or something else that you enjoy.  Unblock your nose  If something blocks your nose, treating the problem may help improve snoring. Your healthcare provider can suggest medicines for allergies or sinus problems. Nasal saline rinses can also open up the nasal passages. Nasal strips applied on the bridge of the nose can aid breathing. Surgery can straighten a deviated septum. Surgery can also reduce the size of the ridges inside the nose (turbinates) or remove growths (polyps). If you smoke, try to quit. Smoking makes a stuffy nose worse.  Understanding the risks of sleep apnea  In some cases, snoring is not physically harmful. But it can be linked to a more serious condition called sleep apnea. Some common symptoms of sleep apnea include:  ? Loud, frequent snoring  ? Heavy daytime drowsiness  ? Trouble breathing during sleep  ? Headaches when you wake up  If you are concerned that you might have sleep apnea, talk with your healthcare provider about your symptoms. Ask about tests and treatments that may help.  Date Last Reviewed: 05/14/2016  ? 2000-2018 [employer], [place]. [address], [place], [place] [address]. All rights reserved. This information is not intended as a substitute for professional medical care. Always follow your healthcare professional's instructions.          Snoring and Sleep Apnea: Notes for a Partner    Snoring and sleep apnea affect your life, as well as your partner?s. You can help in the treatment of the problem. Be supportive. Encourage your partner both to get treatment and to make the adjustments needed to follow through.  Adjusting to changes  Your partner?s treatment may involve making changes to certain life habits. You can help your partner make and stick with these changes. For example:  ? Support and even join your partner?s exercise program.  ? Be supportive if your partner gets CPAP (continuous positive airway pressure). He or she may feel self-conscious at first. Remind your partner to expect adjustments to CPAP before it feels just right.  ? Consider joining a snoring and sleep apnea support group.  Go along to see the healthcare provider  You can give the healthcare provider the best account of your partner?s nighttime breathing and snoring patterns. Try to go along to healthcare provider?s appointments. If you can?t go, write notes for your partner to give to the healthcare provider. Describe your partner?s snoring and sleep breathing patterns in detail.  Tips for sleeping with a snorer  Until treatment takes care of your partner?s snoring:  ? Try to go to bed first. It may help if you?re already asleep when your partner starts to snore.  ? Wear earplugs to bed. A fan or other source of background noise may also help drown out snoring.   Date Last Reviewed: 07/14/2016  ? 2000-2018 [employer], [place]. [address], [place], [place] [address]. All rights reserved. This information is not intended as a substitute for professional medical care. Always follow your healthcare professional's instructions.          What Are Snoring and Obstructive Sleep Apnea?  If you?ve ever had a stuffed-up nose, you know the feeling of trying to breathe through a very narrow passageway. This is what happens in your throat when you snore. While you sleep, structures in your throat partly block your air passage. This makes the passage narrow and hard to breathe through. In some cases the entire passage may become blocked so you can?t breathe at all. Then you have obstructive sleep apnea.      Snoring Obstructive sleep apnea   Snoring  If your throat structures are too large or the muscles relax too much during sleep, the air passage may be partly blocked for a while (temporarily). But over time the air gets past. As air from the nose or mouth passes around this blockage, the throat structures vibrate. This causes the familiar sound of snoring. This sound can be loud. Snorers may wake up others, or even themselves, during the night. Snoring gets worse as more and more of the air passage is blocked.  Obstructive sleep apnea  If the structures partly or completely block the throat, air can?t flow to the lungs at all. This is called hypopnea (decreased breathing) or apnea (meaning ?no breathing?). The lungs aren?t getting fresh air. So the brain tells the body to wake up just enough to tighten the muscles and unblock the air passage. With a loud gasp, breathing begins again. This process may be repeated over and over  again during the night. This can make your sleep fragmented with lighter stages of sleep. You won?t remember waking up many times during the night. But due to lighter sleep you will feel tired the next day. The lack of sleep and fresh air can also strain your lungs, heart, and other organs. This leads to problems such as high blood pressure, heart attack, or stroke.  Problems in the nose and jaw  Problems in the structure of the nose may block breathing. A crooked (deviated) septum or swollen turbinates can make snoring worse or lead to apnea. Also, a receding jaw may make the tongue sit too far back. Then it?s more likely to block the airway when you?re asleep.        Date Last Reviewed: 05/14/2016  ? 2000-2018 [employer], [place]. [address], [place], [place] [address]. All rights reserved. This information is not intended as a substitute for professional medical care. Always follow your healthcare professional's instructions.          Surgical Treatment for Snoring and Sleep Apnea  The goal of most types of surgery for sleep is to widen the airway. This is so you can breathe more easily. Surgery is done by taking out or shrinking excess tissue where the mouth meets the throat. Nasal and jaw surgery can help correct nose or jaw problems that lead to snoring and apnea. This sheet describes procedures that may be advised for you.      UPPP Jaw surgery   Uvulopalatopharyngoplasty (UPPP)  This is the most common surgery for sleep apnea. It trims the soft palate and uvula, and removes the tonsils and other tissue. It is major surgery done in a hospital. Most people go home within 24 hours.  Risks of UPPP  Problems are not common with this surgery. But risks include:  ? Bleeding  ? Throat pain  ? Scarring  ? Nasal-sounding speech  ? False feeling that something is in throat  ? Liquids sometimes going into nose when swallowing  Laser-Assisted Uvulopalatoplasty (LAUP)  This surgery helps relieve snoring. It may also be used in some cases of mild apnea. The doctor uses a laser or electric current to remove some of the soft palate and part or all of the uvula. This surgery may be done over several sessions in the doctor?s office.  Risks of LAUP  The risks are the same as for UPPP, but less likely to occur.  Radiofrequency Ablation (RFA)  This procedure helps relieve snoring. The doctor uses radio waves to reduce the size of the turbinates or uvula, nearby tissue, and sometimes the back of the tongue.  Risks of RFA  ? Mouth ulcer  ? Nerve pain  ? Swelling in airway  ? Pocket of pus (abscess) on tongue  Nasal surgery  Problems in the nose can make snoring or sleep apnea worse. They can also make a CPAP harder to use. A CPAP is a machine that sends pressurized air through a face mask as you sleep. If blockages in your nose are severe, surgery can improve the airflow. It can reduce the size of the turbinates, straighten a crooked (deviated) septum, and remove any polyps. Polyps are overgrowths of sinus lining.  Risks of nasal surgery  ? Bruising  ? Bleeding  ? Damage to or hole in the septum  ? Dryness in nose  Jaw surgery  If your jaw sits too far back, your tongue may also be too far back. That makes  your tongue more likely to block your airway when you sleep. Moving your jaw forward moves your tongue forward and widens the airway overall.  Risks of jaw surgery  In some cases, the jaw does not heal in the desired position. Your healthcare provider can tell you more about this. Other risks include:  ? Loss of teeth   ? Need for orthodontic treatment to realign teeth  ? Loss of feeling in jaw or teeth  ? Change in facial appearance  More severe cases  If your apnea is severe and no other treatment helps, other kinds of surgery may help. Your healthcare provider can tell you about them. Be sure you understand their risks as well as their benefits.  Date Last Reviewed: 07/14/2016  ? 2000-2018 [employer], [place]. [address] [place], [place] [address]. All rights reserved. This information is not intended as a substitute for professional medical care. Always follow your healthcare professional's instructions.          Heart Failure: Dealing with Sleep Problems     An airflow device may be needed if you have sleep apnea.   If you have heart failure and you?re not sleeping well, there are many possible reasons. Many people with heart failure also have sleep apnea. This is a condition that causes snoring and brief periods of not breathing. Age, certain medicines, and not getting enough exercise can also affect sleep. Be sure to tell your healthcare provider if you?re having sleep problems.  Tips for sleeping better  If shortness of breath keeps you awake, your healthcare team needs to know. Tell them if you can?t lie flat or need to sleep propped up on pillows. Or tell them if you can only sleep sitting up in a chair or recliner. If nighttime shortness of breath gets worse, bring this to the team?s attention. You may be accumulating fluid and need more diuretic medicine. If you have other sleep problems not related to shortness of breath, these tips may help:  ? Do deep breathing in bed. This will relax you and help you fall asleep.  ? Don?t drink caffeine any later than noon.  ? Try to go to sleep and wake up around the same time every day. This helps your body set up a sleep cycle.  ? Avoid napping. This can affect your sleep cycle.  ? Pull window shades down. If the room isn?t dark enough, get blackout shades.  ? Keep pets out of the bedroom if they bother you at night.  ? Wear comfortable, loose pajamas. Pajamas that fit tightly may make you feel like it's harder to breathe.  ? If you take medicines at bedtime, talk with your healthcare provider about changing this. Certain medicines may be keeping you awake. Or they may cause you to wake up often to use the bathroom.  ? Talk with your healthcare provider about taking over-the-counter sleep aids. Some OTC medicines can interact with your prescribed medicines. Or they may have salts in them that cause you to retain fluids.  Do you have sleep apnea?  You may not know you have sleep apnea unless someone notices that you have irregular breathing, gasping at night, or snoring while you sleep. You should get checked for this condition. If you have it, treatment can improve your health. Treatment can also ease the stress on your heart and body.  Your healthcare provider may prescribe a CPAP?(continuous positive airway pressure) or BiPap?(bilevel positive airway pressure) device. The machine  sends a gentle flow of air through a nasal mask while you sleep. This air goes through your nose and into your lungs, keeping airways open.  Tips for using CPAP and BiPap  ? If your mask doesn?t fit or feel right, talk with your healthcare provider or the vendor about adjusting it or trying a new one.  ? If you have allergies or other problems that block your nose, get those treated. These devices work best if your nose is clear.  ? If the device doesn?t feel good or work well at first, don?t stop using it. Ask your provider or someone from your medical equipment company for ways to help make it work for you.  ? Newer devices are small, lightweight, and portable. You should take your machine with you when you travel or are not sleeping at home. Some devices have chargeable batteries. They can be taken camping or when sleeping outdoors.  ? Do not use tap water to fill the water chamber for humidity. Deposits in tap water can build up and affect the machine and your breathing. The product makers usually recommend that you use sterile water or distilled water.  ? Ask your healthcare provider if you need oxygen along with your CPAP or BiPAP machine. Some people with heart failure need both.  ? The equipment wears out with time. Make sure your equipment is tested. Get new equipment as often as your healthcare provider recommends.  Date Last Reviewed: 04/14/2015  ? 2000-2018 [employer], [place]. [address], [place], [place] [address]. All rights reserved. This information is not intended as a substitute for professional medical care. Always follow your healthcare professional's instructions.          Nasal Allergies: Related Problems  Allergies can cause nasal passages to swell. This narrows the air passages. Allergies also cause increased mucus production in the nose. These changes result in nasal allergy symptoms. Common symptoms include itching, sneezing, stuffy nose, and runny nose. Nasal allergies can also cause problems in other parts of the respiratory system. Some of the more common problems are discussed below. If you think you have any of these problems, talk to your healthcare provider about treatment choices.    Sinus infections  Fluid may be trapped in the sinuses. Bacteria may grow in trapped fluid. This causes sinus infection (sinusitis).  Conjunctivitis  Allergens irritate your eyes, including the lining of the conjunctiva. This causes eyes to become red, itchy, puffy, and watery.  Ear problems  The eustachian tube connects the middle ear to nasal passages.  Allergies can block this tube, and make the ears feel plugged. Fluid may also build up, leading to an ear infection (otitis media).  Nasal polyps  Allergies cause nasal passages to swell. Constant swelling can lead to formation of a sac called a polyp. Polyps can grow large enough to block nasal passages.  Asthma  Asthma is inflammation and swelling of the air passages in the lungs. The symptoms are wheezing, shortness of breath, coughing, and chest tightness. Allergies, including nasal allergies, are common in people with asthma.  Date Last Reviewed: 06/15/2015  ? 2000-2018 [employer], [place]. [address] [place], [place] [address]. All rights reserved. This information is not intended as a substitute for professional medical care. Always follow your healthcare professional's instructions.          Continuous Positive Air Pressure (CPAP)     A mask over the nose gently directs air into the throat  to keep the airway open.   Continuous positive air pressure (CPAP) uses gentle air pressure to hold the airway open. CPAP is often the most effective treatment for sleep apnea and severe snoring. It works very well for many people. But keep in mind that it can take several adjustments before the setup is right for you.  How CPAP works  The CPAP machine  is a small portable pump that sits beside the bed. The pump sends air through a hose, which is held over your nose alone, or nose and mouth by a mask. Mild air pressure is gently pushed through your airway. The air pressure nudges sagging tissues aside. This widens the airway so you can breathe better. CPAP may be combined with other kinds of therapy for sleep apnea.  Types of air pressure treatments  There are different types of CPAP. Your doctor or CPAP technician will help you decide which type is best for you:  ? Basic CPAP keeps the pressure constant all night long.  ? A bilevel device (BiPAP) provides more pressure when you breathe in and less when you breathe out. A BiPAP machine also may be set to provide automatic breaths to maintain breathing if you stop breathing while sleeping.  ? An autoCPAP device automatically adjusts pressure throughout the night and in response to changes such as body position, sleep stage, and snoring.  Date Last Reviewed: 05/14/2016  ? 2000-2018 [employer], [place]. [address], [place], [place] [address]. All rights reserved. This information is not intended as a substitute for professional medical care. Always follow your healthcare professional's instructions.          Weight Management: Getting Started  Healthy bodies come in all shapes and sizes. Not all bodies are made to be thin. For some people, a healthy weight is higher than the average weight listed on weight charts. Your healthcare provider can help you decide on a healthy weight for you.    Reasons to lose weight  Losing weight can help with some health problems, such as high blood pressure, heart disease, diabetes, sleep apnea, and arthritis. You may also feel more energy.  Set your long-term goal  Your goal doesn't even have to be a specific weight. You may decide on a fitness goal (such as being able to walk 10 miles a week), or a health goal (such as lowering your blood pressure). Choose a goal that is measurable and reasonable, so you know when you've reached it. A goal of reaching a BMI of less than 25 is not always reasonable (or possible).   Make an action plan  Habits don?t change overnight. Setting your goals too high can leave you feeling discouraged if you can?t reach them. Be realistic. Choose one or two small changes you can make now. Set an action plan for how you are going to make these changes. When you can stick to this plan, keep making a few more small changes. Taking small steps will help you stay on the path to success.  Track your progress  Write down your goals. Then, keep a daily record of your progress. Write down what you eat and how active you are. This record lets you look back on how much you?ve done. It may also help when you?re feeling frustrated. Reward yourself for success. Even if you don?t reach every goal, give yourself credit for what you do get done.  Get support  Encouragement from others can help make losing weight easier.  Ask your family members and friends for support. They may even want to join you. Also look to your healthcare provider, registered dietitian, and fitness professional for help. Your local hospital can give you more information about nutrition, exercise, and weight loss. Be sure to get a thorough checkup before you start any exercise program or change your diet.  Date Last Reviewed: 01/12/2017  ? 2000-2018 [employer], [place]. [address] [place], [place] [address]. All rights reserved. This information is not intended as a substitute for professional medical care. Always follow your healthcare professional's instructions.          Weight Management: Healthy Eating  Food is your body?s fuel. You can?t live without it. The key is to give your body enough nutrients and energy without eating too much. Reading food labels can help you make healthy choices. Also, learn new eating habits to manage your weight. Nutrition labels are being redesigned by the FDA to emphasize the number of calories being consumed as well as the amount of more nutrients, such as added sugars, vitamin D, and potassium.     All the values on the label are based on one serving. The serving size is the average portion. Remember to multiply the values on the label by the number of servings you eat.   Eat less fat  A gram of fat has almost 2.5 times the calories of a gram of protein or carbohydrates. Try to balance your food choices so that only 20% to 35% of your calories comes from total fat. This means an average of 2? to 3? grams of fat for each 100 calories you eat.  Eat more fiber  High-fiber foods are digested more slowly than low-fiber foods, so you feel full longer. Try to get at least 25 grams of fiber each day for a 2000 calorie diet. Foods high in fiber include:  ? Vegetables and fruits  ? Whole-grain or bran breads, pastas, and cereals  ? Legumes (beans) and peas  As you start to eat more fiber, be sure to drink plenty of water to keep your digestive system working smoothly.  Tips  Do's and don'ts include:   ? Don?t skip meals. This often leads to overeating later on. It?s best to spread your eating throughout the day.  ? Eat a variety of foods, not just a few favorites.  ? If you find yourself eating when you?re not hungry, ask yourself why. Many of [place] eat when we?re bored, stressed, or just to be polite. Listen to your body. If you?re not hungry, get busy doing something else instead of eating.  ? Eat slower, shooting for 20 to 30 minutes for each meal. It takes 20 minutes for your stomach to tell your brain that it?s full. Slow eaters tend to eat less and are still satisfied, while fast eaters may tend to be overeaters.   ? Pay attention to what you eat. Don?t read or watch TV during your meal.  Date Last Reviewed: 01/12/2017  ? 2000-2018 [employer], [employer]. [address] [place], [place] [address]. All rights reserved. This information is not intended as a substitute for professional medical care. Always follow your healthcare professional's instructions.          Weight Management: Exercise and Activity    Studies show that people who exercise are the most likely to lose weight and keep it off. Exercise burns calories. It helps build muscle to make your body stronger. Make exercise  an important part of your weight-management plan.  Make activity part of your day  You may not think you have the time to exercise. But you can work activity into your daily life?you just need to be committed. Take 10 minutes out of your lunch hour to take a walk. Walk to the newsstand to get your paper instead of having it delivered. Make it a habit to take the stairs instead of the elevator. Park in a far away parking spot instead of the closest. You?ll be surprised at how fast these little changes can make a difference.  Some people really cannot walk very far, and tire out quickly with exercise. Instead of becoming discouraged, resolve to do what you can do, and work to make that a regular frequent habit.   The benefits of exercise  Exercise offers many benefits including:   ? Exercise increases your metabolism (the speed at which your body burns calories).  ? Regular exercise can increase the amount of muscle in your body. Muscle burns calories faster than fat. The more muscle you have, the more calories you burn.  ? Exercise gives you energy and curbs your appetite.  ? Exercise decreases stress and helps you sleep better. Find out for yourself what time of day works best for you.  Make exercise fun    Exercise can be fun. Choose an activity you enjoy. You may even get a friend to do it with you:  ? Take a resistance-training or aerobics class  ? Join a team sport  ? Take a dance class  ? Walk the dog  ? Ride a bike  If you have health problems, be sure to ask your healthcare provider before you start an exercise program. Have a fitness professional help you develop a plan that?s safe for you.

## 2019-12-28 NOTE — Nursing Note
Data card was uploaded and mailed back to patient  Compliance report scanned

## 2019-12-30 NOTE — Progress Notes
PULMONARY/SLEEP MEDICINE NOTE    PCP: Babs Sciara, MD      Chief Complaint   Patient presents with   ? Follow-up       REFERRING PRACTITIONER: Babs Sciara, MD  PRIMARY CARE PROVIDER: Babs Sciara, MD  CHIEF COMPLAINT: OSA and asthma follow up    History of Presenting Illness:  Maurice Ramirez?is a 69 y.o.?male?with  moderate persistent asthma, intermittent episodes of allergic bronchopulmonary aspergillosis , h/o LLL nodule (bx in 2012 benign), OSA on PAP, and h/o afib s/p cardioversion. Card available, but due to issues with main system, will reattempt access to card. He reports using his device every night, still wakes up with the mask off and does not recall why, but improved significantly from last visit. He denies fatigue, daytime sleepiness, snoring and morning headaches. He is exercising daily (brisk walk/run outside).  Has been staying at home with daily run with COVID-19.?Denies shortness of breath, wheezing, chest tightness and dizziness. Compliant on his inhalers.     Asthma meds and hx:  Singulair 10mg  daily, advair 500/50 BID and albuterol (1x/week)  Last hospital visit for asthma was several years ago, intubated about 28yrs ago for a severe attack  Last use of prednisone dose for asthma (prior to sinus infections) was several years ago      COMPLIANCE DATA: No new data today   Data Range:?02/05/2016 to 05/04/2016  Days with Device Usage:?86?days  Days without Device Usage: 4?days   % Days with Device Usage: 95.6?%   Average Usage (days used):?4?Hrs 49?Mins 37?Secs  % Days with Usage >?= 4 hours: 56.7?%   AHI (Apnea Hypopnea Index):?3.4  Auto CPAP Mean Pressure:?7.5?cmH2O  Auto CPAP Peak Average Pressure:??9.9?cmH2O  Average Device Pressure <= 90% of Time: ?9.1?cmH2O  Average Time in Large Leak:?0?Hrs 5?Mins 26?Secs       Medications that the patient states to be currently taking   Medication Sig   ? ADVAIR DISKUS 500-50 MCG/DOSE diskus USE 1 INHALATION TWICE A DAY   ? MONTELUKAST 10 mg tablet TAKE 1 TABLET EVERY EVENING   ? PROAIR HFA 108 (90 Base) MCG/ACT inhaler          PHYSICAL EXAM:  BP 121/79 (BP Location: Left arm, Patient Position: Sitting)  ~ Pulse 58  ~ Temp 36.3 ?C (97.3 ?F) (Temporal)  ~ Resp 18  ~ Ht 5' 7.8'' (1.722 m)  ~ Wt 167 lb 3.2 oz (75.8 kg)  ~ SpO2 98%  ~ BMI 25.57 kg/m?   General: Well-developed male, in no acute distress.   HEENT: Normocephalic and atraumatic  Pulm: CTAB  Neuro: Alert, oriented and appropriately interactive. No focal deficits. Speech fluent.   Psychological: normal affect    LABS:  Lab Results   Component Value Date    HGB 15.2 07/14/2018     No results found for: FERRITIN  Lab Results   Component Value Date    TSH 2.2 06/04/2011       IgE 1666 07/2018 (<-1422)  Imaging:  CT chest 2012  Unchanged biapical fibronodular pleural parenchymal scarring and calcified subcentimeter nodule within the right middle lobe (2-51),   consistent with prior granulomatous disease.?  Persistent diffuse peribronchial and peribronchiolar thickening with associated airway irregularity and occasional ectasia, consistent   with chronic asthma/bronchitis, with scattered bronchocentric bands and architectural distortion within both upper, right middle and left   greater than right lower lobes, consistent with postinflammatory pleural parenchymal scarring.  Near-complete resolution of mass-like consolidation within the left lower lobe  with residual architectural distortion (2-56). Several   fluctuating subcentimeter nodules within the left lower lobe and resolved within the right lower lobe since 05/28/2011, likely airway-related inflammatory and mucus impaction.  ?  Pathology:  LLL nodule 05/2011  LUNG NODULE, LEFT LOWER LOBE (BIOPSY):  - Acute and organizing pneumonia (see Comment)  - Prominent chronic inflammation in interstitium  - Gram stain is negative for bacterial organisms  - GMS stain is negative for fungal organisms  - AFB stain is negative for acid-fast bacilli  - No neoplasms identified    PFT 12/2019  FVC  (preBD) 3.60/90% (postBD) 4.26/108%   FEV1 (preBD 1.22/40% (postBD) 1.60/52%  Has positive BD response   ?  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 Has been doing well with no issues. Will obtain compliance data from card and mail back to patient, as network issues precluding immediate access to SD card data now.        Obstructive Sleep Apnea:  Continue with PAP therapy, the patient has acclimated well and is experiencing symptomatic improvement.  The patient is willing to continue with PAP therapy.  COVID precautions for cpap given    Asthma  Well controlled  No change in therapy  Has dose of z pack, prednisone and epi pen in case of asthma exacerbation  Repeat PFTs   Will send blood work done on previous visit with primary     I discussed the plan in detail with the patient who voiced understanding and is in agreement.    >50% of the visit (20 minutes) was spent counseling and coordinating care.    Follow up: ~1 year, sooner if needed.      Maurice Ramirez   PGY-7  Sleep Medicine Fellow   12/28/2019

## 2019-12-31 DIAGNOSIS — E7849 Other hyperlipidemia: Secondary | ICD-10-CM | POA: Diagnosis not present

## 2019-12-31 DIAGNOSIS — I10 Essential (primary) hypertension: Secondary | ICD-10-CM | POA: Diagnosis not present

## 2019-12-31 DIAGNOSIS — J449 Chronic obstructive pulmonary disease, unspecified: Secondary | ICD-10-CM | POA: Diagnosis not present

## 2019-12-31 DIAGNOSIS — Z6837 Body mass index (BMI) 37.0-37.9, adult: Secondary | ICD-10-CM | POA: Diagnosis not present

## 2020-01-02 DIAGNOSIS — Z23 Encounter for immunization: Secondary | ICD-10-CM | POA: Diagnosis not present

## 2020-01-25 NOTE — Progress Notes
I saw and evaluated  Maurice Ramirez.  I discussed the case with the fellow and agree with the findings and plan of care as documented in the resident's note.       Tyrone Pautsch R. Johnanthony Wilden

## 2020-01-31 DIAGNOSIS — C61 Malignant neoplasm of prostate: Secondary | ICD-10-CM | POA: Diagnosis not present

## 2020-02-07 DIAGNOSIS — C61 Malignant neoplasm of prostate: Secondary | ICD-10-CM | POA: Diagnosis not present

## 2020-02-07 DIAGNOSIS — N3040 Irradiation cystitis without hematuria: Secondary | ICD-10-CM | POA: Diagnosis not present

## 2020-02-07 DIAGNOSIS — N393 Stress incontinence (female) (male): Secondary | ICD-10-CM | POA: Diagnosis not present

## 2020-02-07 DIAGNOSIS — C775 Secondary and unspecified malignant neoplasm of intrapelvic lymph nodes: Secondary | ICD-10-CM | POA: Diagnosis not present

## 2020-02-08 DIAGNOSIS — D3132 Benign neoplasm of left choroid: Secondary | ICD-10-CM | POA: Diagnosis not present

## 2020-02-28 DIAGNOSIS — L918 Other hypertrophic disorders of the skin: Secondary | ICD-10-CM | POA: Diagnosis not present

## 2020-02-28 DIAGNOSIS — L0100 Impetigo, unspecified: Secondary | ICD-10-CM | POA: Diagnosis not present

## 2020-02-28 DIAGNOSIS — L400 Psoriasis vulgaris: Secondary | ICD-10-CM | POA: Diagnosis not present

## 2020-05-10 MED ORDER — MONTELUKAST SODIUM 10 MG PO TABS
ORAL_TABLET | ORAL | 3 refills | 30.00000 days
Start: 2020-05-10 — End: ?

## 2020-05-12 DIAGNOSIS — M5030 Other cervical disc degeneration, unspecified cervical region: Secondary | ICD-10-CM | POA: Diagnosis not present

## 2020-05-12 DIAGNOSIS — I10 Essential (primary) hypertension: Secondary | ICD-10-CM | POA: Diagnosis not present

## 2020-05-12 DIAGNOSIS — E7849 Other hyperlipidemia: Secondary | ICD-10-CM | POA: Diagnosis not present

## 2020-05-12 DIAGNOSIS — Z0000 Encounter for general adult medical examination without abnormal findings: Secondary | ICD-10-CM | POA: Diagnosis not present

## 2020-05-12 DIAGNOSIS — R7301 Impaired fasting glucose: Secondary | ICD-10-CM | POA: Diagnosis not present

## 2020-05-12 DIAGNOSIS — Z1389 Encounter for screening for other disorder: Secondary | ICD-10-CM | POA: Diagnosis not present

## 2020-05-12 DIAGNOSIS — Z6838 Body mass index (BMI) 38.0-38.9, adult: Secondary | ICD-10-CM | POA: Diagnosis not present

## 2020-05-15 DIAGNOSIS — Z125 Encounter for screening for malignant neoplasm of prostate: Secondary | ICD-10-CM | POA: Diagnosis not present

## 2020-05-15 DIAGNOSIS — Z1389 Encounter for screening for other disorder: Secondary | ICD-10-CM | POA: Diagnosis not present

## 2020-05-15 DIAGNOSIS — M8580 Other specified disorders of bone density and structure, unspecified site: Secondary | ICD-10-CM | POA: Diagnosis not present

## 2020-05-15 DIAGNOSIS — I10 Essential (primary) hypertension: Secondary | ICD-10-CM | POA: Diagnosis not present

## 2020-05-15 DIAGNOSIS — C61 Malignant neoplasm of prostate: Secondary | ICD-10-CM | POA: Diagnosis not present

## 2020-05-15 DIAGNOSIS — R7301 Impaired fasting glucose: Secondary | ICD-10-CM | POA: Diagnosis not present

## 2020-05-15 DIAGNOSIS — Z0000 Encounter for general adult medical examination without abnormal findings: Secondary | ICD-10-CM | POA: Diagnosis not present

## 2020-05-15 MED ORDER — MONTELUKAST SODIUM 10 MG PO TABS
ORAL_TABLET | 3 refills | Status: AC
Start: 2020-05-15 — End: ?

## 2020-05-16 ENCOUNTER — Other Ambulatory Visit: Payer: Self-pay | Admitting: Family Medicine

## 2020-05-16 ENCOUNTER — Other Ambulatory Visit (HOSPITAL_COMMUNITY): Payer: Self-pay | Admitting: Family Medicine

## 2020-05-16 DIAGNOSIS — I10 Essential (primary) hypertension: Secondary | ICD-10-CM

## 2020-05-16 DIAGNOSIS — Z0000 Encounter for general adult medical examination without abnormal findings: Secondary | ICD-10-CM

## 2020-05-16 DIAGNOSIS — Z136 Encounter for screening for cardiovascular disorders: Secondary | ICD-10-CM

## 2020-05-16 DIAGNOSIS — R7301 Impaired fasting glucose: Secondary | ICD-10-CM

## 2020-05-16 DIAGNOSIS — Z720 Tobacco use: Secondary | ICD-10-CM

## 2020-05-25 DIAGNOSIS — N5201 Erectile dysfunction due to arterial insufficiency: Secondary | ICD-10-CM | POA: Diagnosis not present

## 2020-05-25 DIAGNOSIS — C61 Malignant neoplasm of prostate: Secondary | ICD-10-CM | POA: Diagnosis not present

## 2020-05-25 DIAGNOSIS — E559 Vitamin D deficiency, unspecified: Secondary | ICD-10-CM | POA: Diagnosis not present

## 2020-05-25 DIAGNOSIS — N281 Cyst of kidney, acquired: Secondary | ICD-10-CM | POA: Diagnosis not present

## 2020-05-25 DIAGNOSIS — C775 Secondary and unspecified malignant neoplasm of intrapelvic lymph nodes: Secondary | ICD-10-CM | POA: Diagnosis not present

## 2020-05-26 ENCOUNTER — Other Ambulatory Visit: Payer: Self-pay | Admitting: Family Medicine

## 2020-05-26 ENCOUNTER — Other Ambulatory Visit (HOSPITAL_COMMUNITY): Payer: Self-pay | Admitting: Family Medicine

## 2020-05-26 DIAGNOSIS — J449 Chronic obstructive pulmonary disease, unspecified: Secondary | ICD-10-CM

## 2020-06-02 ENCOUNTER — Other Ambulatory Visit: Payer: Self-pay | Admitting: Family Medicine

## 2020-06-02 DIAGNOSIS — C61 Malignant neoplasm of prostate: Secondary | ICD-10-CM

## 2020-06-02 DIAGNOSIS — R0902 Hypoxemia: Secondary | ICD-10-CM

## 2020-06-08 ENCOUNTER — Ambulatory Visit (HOSPITAL_COMMUNITY): Payer: Medicare Other

## 2020-06-29 ENCOUNTER — Other Ambulatory Visit: Payer: Self-pay

## 2020-06-29 ENCOUNTER — Ambulatory Visit (HOSPITAL_COMMUNITY)
Admission: RE | Admit: 2020-06-29 | Discharge: 2020-06-29 | Disposition: A | Discharge status: Home / Self Care | Payer: Medicare Other | Source: Ambulatory Visit | Attending: Acute Care | Admitting: Acute Care

## 2020-06-29 DIAGNOSIS — F17210 Nicotine dependence, cigarettes, uncomplicated: Secondary | ICD-10-CM | POA: Diagnosis not present

## 2020-06-29 DIAGNOSIS — Z122 Encounter for screening for malignant neoplasm of respiratory organs: Secondary | ICD-10-CM | POA: Insufficient documentation

## 2020-06-29 DIAGNOSIS — Z87891 Personal history of nicotine dependence: Secondary | ICD-10-CM | POA: Insufficient documentation

## 2020-06-29 DIAGNOSIS — F17200 Nicotine dependence, unspecified, uncomplicated: Secondary | ICD-10-CM | POA: Diagnosis not present

## 2020-07-06 NOTE — Progress Notes (Signed)
Please call patient and let them  know their  low dose Ct was read as a Lung RADS 2: nodules that are benign in appearance and behavior with a very low likelihood of becoming a clinically active cancer due to size or lack of growth. Recommendation per radiology is for a repeat LDCT in 12 months. .Please let them  know we will order and schedule their  annual screening scan for 06/2021. Please let them  know there was notation of CAD on their  scan.  Please remind the patient  that this is a non-gated exam therefore degree or severity of disease  cannot be determined. Please have them  follow up with their PCP regarding potential risk factor modification, dietary therapy or pharmacologic therapy if clinically indicated. °Pt.  is  not currently on statin therapy. Please place order for annual  screening scan for  06/2021 and fax results to PCP. Thanks so much. °

## 2020-07-06 NOTE — Progress Notes (Signed)
Please let patient know there was notation of Hepatic steatosis. This  is a term that describes the build up of fat in the liver. It is normal to have small amounts of fat in your liver, but when the proportion of liver cells that contain fat exceeds more than 5% it is indicative of early stage fatty liver.Treatment often involves reducing risk factors through a diet and exercise plan. It is generally a benign condition, but in a small percentage of patients it does require follow up. Please have the patient follow up with PCP regarding potential risk factor modification, dietary therapy or pharmacologic therapy if clinically indicated. Also let him know there is notation of some CAD that he should follow uip with his PCP about.   Also, if you could please call PCP office Delman Cheadle) I tried to message her, but she is not in the [place], and let her know there was notation of CAD and suggestion that the patient should consider an echo to evaluate. He is not on statin therapy. Thanks

## 2020-07-07 ENCOUNTER — Other Ambulatory Visit: Payer: Self-pay | Admitting: *Deleted

## 2020-07-07 DIAGNOSIS — F17210 Nicotine dependence, cigarettes, uncomplicated: Secondary | ICD-10-CM

## 2020-07-07 DIAGNOSIS — Z87891 Personal history of nicotine dependence: Secondary | ICD-10-CM

## 2020-08-22 DIAGNOSIS — C61 Malignant neoplasm of prostate: Secondary | ICD-10-CM | POA: Diagnosis not present

## 2020-08-31 DIAGNOSIS — N5201 Erectile dysfunction due to arterial insufficiency: Secondary | ICD-10-CM | POA: Diagnosis not present

## 2020-08-31 DIAGNOSIS — R351 Nocturia: Secondary | ICD-10-CM | POA: Diagnosis not present

## 2020-08-31 DIAGNOSIS — C775 Secondary and unspecified malignant neoplasm of intrapelvic lymph nodes: Secondary | ICD-10-CM | POA: Diagnosis not present

## 2020-08-31 DIAGNOSIS — C61 Malignant neoplasm of prostate: Secondary | ICD-10-CM | POA: Diagnosis not present

## 2020-09-25 ENCOUNTER — Telehealth: Payer: BLUE CROSS/BLUE SHIELD

## 2020-09-25 NOTE — Telephone Encounter
Appointment Accommodation Request      Appointment Type: Follow Up, Pulmonary Pt     Reason for sooner request: Robyn, pt's assistant, is requesting an appointment with MD for sometime next year. Pt needs to follow up for pulmonary.    Date/Time Requested (If any): Next available     Last seen by MD: 12/28/19    Any Symptoms:  []  Yes  [x]  No       If yes, what symptoms are you experiencing:   o Duration of symptoms (how long):     Patient or caller was offered an appointment but declined.    Patient or caller was advised to seek emergency services if conditions are urgent or emergent.    Patient has been notified of the 24-48 hour turnaround time.

## 2020-11-17 DIAGNOSIS — G4701 Insomnia due to medical condition: Secondary | ICD-10-CM | POA: Diagnosis not present

## 2020-11-29 DIAGNOSIS — I10 Essential (primary) hypertension: Secondary | ICD-10-CM | POA: Diagnosis not present

## 2020-11-29 DIAGNOSIS — M549 Dorsalgia, unspecified: Secondary | ICD-10-CM | POA: Diagnosis not present

## 2020-11-29 DIAGNOSIS — C61 Malignant neoplasm of prostate: Secondary | ICD-10-CM | POA: Diagnosis not present

## 2020-11-29 DIAGNOSIS — Z6837 Body mass index (BMI) 37.0-37.9, adult: Secondary | ICD-10-CM | POA: Diagnosis not present

## 2020-11-29 DIAGNOSIS — M5136 Other intervertebral disc degeneration, lumbar region: Secondary | ICD-10-CM | POA: Diagnosis not present

## 2020-12-07 DIAGNOSIS — N5201 Erectile dysfunction due to arterial insufficiency: Secondary | ICD-10-CM | POA: Diagnosis not present

## 2020-12-07 DIAGNOSIS — C775 Secondary and unspecified malignant neoplasm of intrapelvic lymph nodes: Secondary | ICD-10-CM | POA: Diagnosis not present

## 2020-12-07 DIAGNOSIS — C61 Malignant neoplasm of prostate: Secondary | ICD-10-CM | POA: Diagnosis not present

## 2020-12-07 DIAGNOSIS — R351 Nocturia: Secondary | ICD-10-CM | POA: Diagnosis not present

## 2020-12-14 DIAGNOSIS — M461 Sacroiliitis, not elsewhere classified: Secondary | ICD-10-CM | POA: Diagnosis not present

## 2020-12-14 DIAGNOSIS — M47816 Spondylosis without myelopathy or radiculopathy, lumbar region: Secondary | ICD-10-CM | POA: Diagnosis not present

## 2020-12-23 MED ORDER — ADVAIR DISKUS 500-50 MCG/DOSE IN AEPB
11 refills
Start: 2020-12-23 — End: ?

## 2020-12-25 MED ORDER — ADVAIR DISKUS 500-50 MCG/DOSE IN AEPB
11 refills | Status: AC
Start: 2020-12-25 — End: ?

## 2021-01-03 ENCOUNTER — Other Ambulatory Visit: Payer: Self-pay | Admitting: Neurological Surgery

## 2021-01-03 ENCOUNTER — Other Ambulatory Visit (HOSPITAL_COMMUNITY): Payer: Self-pay | Admitting: Neurological Surgery

## 2021-01-03 DIAGNOSIS — I10 Essential (primary) hypertension: Secondary | ICD-10-CM | POA: Diagnosis not present

## 2021-01-03 DIAGNOSIS — M549 Dorsalgia, unspecified: Secondary | ICD-10-CM

## 2021-01-03 DIAGNOSIS — Z6837 Body mass index (BMI) 37.0-37.9, adult: Secondary | ICD-10-CM | POA: Diagnosis not present

## 2021-01-03 DIAGNOSIS — M47816 Spondylosis without myelopathy or radiculopathy, lumbar region: Secondary | ICD-10-CM | POA: Diagnosis not present

## 2021-01-11 ENCOUNTER — Other Ambulatory Visit: Payer: Self-pay

## 2021-01-11 ENCOUNTER — Ambulatory Visit (HOSPITAL_COMMUNITY)
Admission: RE | Admit: 2021-01-11 | Discharge: 2021-01-11 | Disposition: A | Discharge status: Home / Self Care | Payer: Medicare Other | Source: Ambulatory Visit | Attending: Neurological Surgery | Admitting: Neurological Surgery

## 2021-01-11 DIAGNOSIS — M549 Dorsalgia, unspecified: Secondary | ICD-10-CM | POA: Diagnosis not present

## 2021-01-11 DIAGNOSIS — M545 Low back pain, unspecified: Secondary | ICD-10-CM | POA: Diagnosis not present

## 2021-01-24 DIAGNOSIS — M961 Postlaminectomy syndrome, not elsewhere classified: Secondary | ICD-10-CM | POA: Diagnosis not present

## 2021-01-24 DIAGNOSIS — I10 Essential (primary) hypertension: Secondary | ICD-10-CM | POA: Diagnosis not present

## 2021-01-24 DIAGNOSIS — Z981 Arthrodesis status: Secondary | ICD-10-CM | POA: Diagnosis not present

## 2021-01-24 DIAGNOSIS — M549 Dorsalgia, unspecified: Secondary | ICD-10-CM | POA: Diagnosis not present

## 2021-01-24 DIAGNOSIS — Z6837 Body mass index (BMI) 37.0-37.9, adult: Secondary | ICD-10-CM | POA: Diagnosis not present

## 2021-02-06 DIAGNOSIS — Z6837 Body mass index (BMI) 37.0-37.9, adult: Secondary | ICD-10-CM | POA: Diagnosis not present

## 2021-02-06 DIAGNOSIS — M961 Postlaminectomy syndrome, not elsewhere classified: Secondary | ICD-10-CM | POA: Diagnosis not present

## 2021-02-06 DIAGNOSIS — G894 Chronic pain syndrome: Secondary | ICD-10-CM | POA: Diagnosis not present

## 2021-03-29 DIAGNOSIS — I10 Essential (primary) hypertension: Secondary | ICD-10-CM | POA: Diagnosis not present

## 2021-03-29 DIAGNOSIS — J449 Chronic obstructive pulmonary disease, unspecified: Secondary | ICD-10-CM | POA: Diagnosis not present

## 2021-03-29 DIAGNOSIS — M545 Low back pain, unspecified: Secondary | ICD-10-CM | POA: Diagnosis not present

## 2021-05-11 MED ORDER — MONTELUKAST SODIUM 10 MG PO TABS
10 mg | ORAL_TABLET | Freq: Every evening | ORAL | 3 refills | Status: AC
Start: 2021-05-11 — End: ?

## 2021-05-11 MED ORDER — MONTELUKAST SODIUM 10 MG PO TABS
ORAL_TABLET | 3 refills
Start: 2021-05-11 — End: ?

## 2021-05-17 DIAGNOSIS — Z20822 Contact with and (suspected) exposure to covid-19: Secondary | ICD-10-CM | POA: Diagnosis not present

## 2021-05-17 MED ORDER — MONTELUKAST SODIUM 10 MG PO TABS
ORAL_TABLET | 3 refills | Status: AC
Start: 2021-05-17 — End: ?

## 2021-05-30 DIAGNOSIS — E7849 Other hyperlipidemia: Secondary | ICD-10-CM | POA: Diagnosis not present

## 2021-05-30 DIAGNOSIS — Z0000 Encounter for general adult medical examination without abnormal findings: Secondary | ICD-10-CM | POA: Diagnosis not present

## 2021-05-30 DIAGNOSIS — C61 Malignant neoplasm of prostate: Secondary | ICD-10-CM | POA: Diagnosis not present

## 2021-06-01 DIAGNOSIS — Z6837 Body mass index (BMI) 37.0-37.9, adult: Secondary | ICD-10-CM | POA: Diagnosis not present

## 2021-06-01 DIAGNOSIS — J449 Chronic obstructive pulmonary disease, unspecified: Secondary | ICD-10-CM | POA: Diagnosis not present

## 2021-06-01 DIAGNOSIS — G43109 Migraine with aura, not intractable, without status migrainosus: Secondary | ICD-10-CM | POA: Diagnosis not present

## 2021-06-01 DIAGNOSIS — Z1331 Encounter for screening for depression: Secondary | ICD-10-CM | POA: Diagnosis not present

## 2021-06-01 DIAGNOSIS — Z0001 Encounter for general adult medical examination with abnormal findings: Secondary | ICD-10-CM | POA: Diagnosis not present

## 2021-06-01 DIAGNOSIS — C61 Malignant neoplasm of prostate: Secondary | ICD-10-CM | POA: Diagnosis not present

## 2021-06-01 DIAGNOSIS — E782 Mixed hyperlipidemia: Secondary | ICD-10-CM | POA: Diagnosis not present

## 2021-06-01 DIAGNOSIS — I10 Essential (primary) hypertension: Secondary | ICD-10-CM | POA: Diagnosis not present

## 2021-06-01 DIAGNOSIS — M5030 Other cervical disc degeneration, unspecified cervical region: Secondary | ICD-10-CM | POA: Diagnosis not present

## 2021-06-01 DIAGNOSIS — F17200 Nicotine dependence, unspecified, uncomplicated: Secondary | ICD-10-CM | POA: Diagnosis not present

## 2021-06-01 DIAGNOSIS — R351 Nocturia: Secondary | ICD-10-CM | POA: Diagnosis not present

## 2021-06-07 DIAGNOSIS — N393 Stress incontinence (female) (male): Secondary | ICD-10-CM | POA: Diagnosis not present

## 2021-06-07 DIAGNOSIS — C775 Secondary and unspecified malignant neoplasm of intrapelvic lymph nodes: Secondary | ICD-10-CM | POA: Diagnosis not present

## 2021-06-07 DIAGNOSIS — C61 Malignant neoplasm of prostate: Secondary | ICD-10-CM | POA: Diagnosis not present

## 2021-06-18 DIAGNOSIS — Z20822 Contact with and (suspected) exposure to covid-19: Secondary | ICD-10-CM | POA: Diagnosis not present

## 2021-06-21 DIAGNOSIS — F17200 Nicotine dependence, unspecified, uncomplicated: Secondary | ICD-10-CM | POA: Diagnosis not present

## 2021-06-21 DIAGNOSIS — Z6837 Body mass index (BMI) 37.0-37.9, adult: Secondary | ICD-10-CM | POA: Diagnosis not present

## 2021-06-21 DIAGNOSIS — K625 Hemorrhage of anus and rectum: Secondary | ICD-10-CM | POA: Diagnosis not present

## 2021-06-21 DIAGNOSIS — I10 Essential (primary) hypertension: Secondary | ICD-10-CM | POA: Diagnosis not present

## 2021-06-21 DIAGNOSIS — G43109 Migraine with aura, not intractable, without status migrainosus: Secondary | ICD-10-CM | POA: Diagnosis not present

## 2021-06-21 DIAGNOSIS — R7301 Impaired fasting glucose: Secondary | ICD-10-CM | POA: Diagnosis not present

## 2021-06-21 DIAGNOSIS — C61 Malignant neoplasm of prostate: Secondary | ICD-10-CM | POA: Diagnosis not present

## 2021-06-21 DIAGNOSIS — J449 Chronic obstructive pulmonary disease, unspecified: Secondary | ICD-10-CM | POA: Diagnosis not present

## 2021-06-21 DIAGNOSIS — M5030 Other cervical disc degeneration, unspecified cervical region: Secondary | ICD-10-CM | POA: Diagnosis not present

## 2021-07-04 ENCOUNTER — Encounter (INDEPENDENT_AMBULATORY_CARE_PROVIDER_SITE_OTHER): Payer: Self-pay | Admitting: *Deleted

## 2021-07-05 DIAGNOSIS — D485 Neoplasm of uncertain behavior of skin: Secondary | ICD-10-CM | POA: Diagnosis not present

## 2021-07-05 DIAGNOSIS — L570 Actinic keratosis: Secondary | ICD-10-CM | POA: Diagnosis not present

## 2021-07-05 DIAGNOSIS — B079 Viral wart, unspecified: Secondary | ICD-10-CM | POA: Diagnosis not present

## 2021-07-16 ENCOUNTER — Other Ambulatory Visit: Payer: Self-pay | Admitting: *Deleted

## 2021-07-16 ENCOUNTER — Ambulatory Visit (INDEPENDENT_AMBULATORY_CARE_PROVIDER_SITE_OTHER): Payer: Medicare Other | Admitting: Gastroenterology

## 2021-07-16 ENCOUNTER — Encounter (INDEPENDENT_AMBULATORY_CARE_PROVIDER_SITE_OTHER): Payer: Self-pay | Admitting: Gastroenterology

## 2021-07-16 ENCOUNTER — Other Ambulatory Visit: Payer: Self-pay

## 2021-07-16 DIAGNOSIS — Z87891 Personal history of nicotine dependence: Secondary | ICD-10-CM

## 2021-07-16 DIAGNOSIS — K625 Hemorrhage of anus and rectum: Secondary | ICD-10-CM | POA: Diagnosis not present

## 2021-07-16 DIAGNOSIS — F17210 Nicotine dependence, cigarettes, uncomplicated: Secondary | ICD-10-CM

## 2021-07-16 NOTE — Progress Notes (Signed)
Mark Cannon, M.D. Gastroenterology & Hepatology [facility] [address] [place], [place] [address] Primary Care Physician: Scherrie Bateman [address] [place] [address]  Referring MD: PCP  Chief Complaint: Rectal bleeding  History of Present Illness: Mark Cannon is a 70 y.o. male with Pmh GERD, prostate cancer status post radiation therapy, HLD, HTN,  and OSA, who presents for evaluation of rectal bleeding.  Patient reports that in 2018 he had radiation therapy for prostate cancer. Since then he has presented intermittent episodes of rectal bleeding without a clear pattern.  He describes it as fresh blood on top of the stool without clots, which usually last for 3-4 days and clear for a while. Reports that it usually comes with some rectal pain that can last sometimes for 2 weeks, which can be quite bothersome for him. There is no pattern for his symptoms. He has used Preparation H but does not feel it has really helped for his symptoms.  Due to the symptoms he underwent a colonoscopy by Dr. Arnoldo Morale in the past as described below.  The patient does not take any blood thinners or NSAIDs.  The patient denies having any nausea, vomiting, fever, chills, melena, hematemesis, abdominal distention, abdominal pain, diarrhea, jaundice, pruritus or weight loss.  Last Colonoscopy: 2019 performed by Dr. Arnoldo Morale - One 4 mm polyp in the distal ascending colon, removed with a hot snare. Path TA. - One 2 mm polyp in the proximal transverse colon, removed with a hot snare. Path TA. - One 5 mm polyp in the mid sigmoid colon, removed with a hot snare. Path TA. - Diverticulosis in the sigmoid colon. - The examination was otherwise normal on direct and retroflexion views.  FHx: neg for any gastrointestinal/liver disease, no malignancies Social: smokes 1 pack a day, used to drink alcohol 2-3 times a week in the past but quit  a 12 years ago, neg illicit drug use Surgical: cholecystectomy  Past Medical History: Past Medical History:  Diagnosis Date   Arthritis    Cancer ([place])    Prostate, has had prostate removed, has had radiation and still has cancer,   Colon polyps    Cough    GERD (gastroesophageal reflux disease)    occasional    H/O tooth extraction week of 03/15/2015    History of kidney stones    Hypercholesteremia    Hypertension    Lumbar stenosis with neurogenic claudication    Pneumonia    Pre-diabetes    Renal disorder    Sleep apnea    No cpap    Past Surgical History: Past Surgical History:  Procedure Laterality Date   APPENDECTOMY     BACK SURGERY     CHOLECYSTECTOMY     COLONOSCOPY  02/04/2012   Procedure: COLONOSCOPY;  Surgeon: Jamesetta So, MD;  [facility]: [facility];  Service: Gastroenterology;  Laterality: N/A;   COLONOSCOPY N/A 09/22/2018   Procedure: COLONOSCOPY;  Surgeon: Aviva Signs, MD;  Location: [facility];  Service: Gastroenterology;  Laterality: N/A;   COLONOSCOPY W/ POLYPECTOMY     HERNIA REPAIR     Bilateral inguinal hernia repair X 4   hydrocelectomy     left knee arthroscopy     LITHOTRIPSY     LYMPHADENECTOMY Bilateral 03/22/2015   Procedure: PELVIC LYMPHADENECTOMY;  Surgeon: Alexis Frock, MD;  Location: [facility];  Service: Urology;  Laterality: Bilateral;   NM MYOVIEW LTD  2011   POLYPECTOMY  09/22/2018   Procedure: POLYPECTOMY;  Surgeon: Aviva Signs, MD;  Location: [facility];  Service: Gastroenterology;;   ROBOT ASSISTED LAPAROSCOPIC RADICAL PROSTATECTOMY N/A 03/22/2015   Procedure: ROBOTIC ASSISTED LAPAROSCOPIC RADICAL PROSTATECTOMY WITH INDOCYANINE GREEN DYE INJECTION;  Surgeon: Alexis Frock, MD;  Location: [facility];  Service: Urology;  Laterality: N/A;   ROBOTIC ASSISTED LAPAROSCOPIC LYSIS OF ADHESION  03/22/2015   Procedure: ROBOTIC ASSISTED LAPAROSCOPIC LYSIS OF ADHESION;  Surgeon: Alexis Frock, MD;  Location: [facility];  Service:  Urology;;   SHOULDER SURGERY Right    VASECTOMY      Family History: Family History  Problem Relation Age of Onset   Heart failure Father    Stroke Father    Colon cancer Neg Hx     Social History: Social History   Tobacco Use  Smoking Status Every Day   Packs/day: 1.00   Years: 55.00   Pack years: 55.00   Types: Cigarettes  Smokeless Tobacco Never   Social History   Substance and Sexual Activity  Alcohol Use Yes   Alcohol/week: 1.0 standard drink   Types: 1 Glasses of wine per week   Comment: one glass of wine once a month   Social History   Substance and Sexual Activity  Drug Use No    Allergies: Allergies  Allergen Reactions   Lidocaine Hives   Benzocaine Other (See Comments)    Blisters    Other     Novocaine - blisters in mouth    Medications: Current Outpatient Medications  Medication Sig Dispense Refill   amLODipine (NORVASC) 10 MG tablet Take 10 mg by mouth daily.     cetirizine (ZYRTEC) 10 MG tablet Take 10 mg by mouth daily.     hydrochlorothiazide (HYDRODIURIL) 25 MG tablet Take 25 mg by mouth daily.     meloxicam (MOBIC) 15 MG tablet Take 15 mg by mouth daily.     olmesartan (BENICAR) 20 MG tablet Take 20 mg by mouth daily.     OXYCODONE HCL PO Take by mouth. Takes about one half a week prn     No current facility-administered medications for this visit.    Review of Systems: GENERAL: negative for malaise, night sweats HEENT: No changes in hearing or vision, no nose bleeds or other nasal problems. NECK: Negative for lumps, goiter, pain and significant neck swelling RESPIRATORY: Negative for cough, wheezing CARDIOVASCULAR: Negative for chest pain, leg swelling, palpitations, orthopnea GI: SEE HPI MUSCULOSKELETAL: Negative for joint pain or swelling, back pain, and muscle pain. SKIN: Negative for lesions, rash PSYCH: Negative for sleep disturbance, mood disorder and recent psychosocial stressors. HEMATOLOGY Negative for prolonged  bleeding, bruising easily, and swollen nodes. ENDOCRINE: Negative for cold or heat intolerance, polyuria, polydipsia and goiter. NEURO: negative for tremor, gait imbalance, syncope and seizures. The remainder of the review of systems is noncontributory.   Physical Exam: BP 121/75 (BP Location: Left Arm, Patient Position: Sitting, Cuff Size: Large)   Pulse 93   Temp 98.3 F (36.8 C) (Oral)   Ht 5' 7.5" (1.715 m)   Wt 240 lb (108.9 kg)   BMI 37.03 kg/m  GENERAL: The patient is AO x3, in no acute distress. Obese. HEENT: Head is normocephalic and atraumatic. EOMI are intact. Mouth is well hydrated and without lesions. NECK: Supple. No masses LUNGS: Clear to auscultation. No presence of rhonchi/wheezing/rales. Adequate chest expansion HEART: RRR, normal s1 and s2. ABDOMEN: Soft, nontender, no guarding, no peritoneal signs, and nondistended. BS +. No masses. RECTAL EXAM:deferred  EXTREMITIES: Without any cyanosis, clubbing, rash, lesions or edema. NEUROLOGIC: AOx3, no focal motor deficit. SKIN: no jaundice, no rashes   Imaging/Labs: as above  I personally reviewed and interpreted the available labs, imaging and endoscopic files.  Impression and Plan: OSRIC KLOPF is a 70 y.o. male with Pmh GERD, prostate cancer status post radiation therapy, HLD, HTN,  and OSA, who presents for evaluation of rectal bleeding.  The patient has presented recurrent episodes of rectal bleeding of unclear etiology.  However, the most likely cause of this is related to radiation proctitis given the timing of his symptoms.  I had a thorough discussion with the patient regarding the need to repeat a colonoscopy to explore for other sources but also some potential therapeutic treatment with APC.  The patient understood and agreed.  We will schedule a colonoscopy for him.  - Schedule colonoscopy  All questions were answered.      Mark Peppers, MD Gastroenterology and Hepatology [facility]

## 2021-07-16 NOTE — Patient Instructions (Signed)
Schedule colonoscopy

## 2021-07-17 ENCOUNTER — Other Ambulatory Visit (INDEPENDENT_AMBULATORY_CARE_PROVIDER_SITE_OTHER): Payer: Self-pay

## 2021-07-17 ENCOUNTER — Encounter (INDEPENDENT_AMBULATORY_CARE_PROVIDER_SITE_OTHER): Payer: Self-pay

## 2021-07-18 ENCOUNTER — Ambulatory Visit: Payer: BLUE CROSS/BLUE SHIELD

## 2021-08-09 DIAGNOSIS — I959 Hypotension, unspecified: Secondary | ICD-10-CM | POA: Diagnosis not present

## 2021-08-10 ENCOUNTER — Encounter (HOSPITAL_COMMUNITY): Payer: Self-pay

## 2021-08-10 ENCOUNTER — Ambulatory Visit (HOSPITAL_COMMUNITY): Admit: 2021-08-10 | Payer: Medicare Other | Admitting: Gastroenterology

## 2021-08-10 SURGERY — COLONOSCOPY WITH PROPOFOL
Anesthesia: Monitor Anesthesia Care

## 2021-08-15 ENCOUNTER — Ambulatory Visit (HOSPITAL_COMMUNITY)
Admission: RE | Admit: 2021-08-15 | Discharge: 2021-08-15 | Disposition: A | Discharge status: Home / Self Care | Payer: Medicare Other | Source: Ambulatory Visit | Attending: Acute Care | Admitting: Acute Care

## 2021-08-15 ENCOUNTER — Other Ambulatory Visit: Payer: Self-pay

## 2021-08-15 DIAGNOSIS — K760 Fatty (change of) liver, not elsewhere classified: Secondary | ICD-10-CM | POA: Diagnosis not present

## 2021-08-15 DIAGNOSIS — F17210 Nicotine dependence, cigarettes, uncomplicated: Secondary | ICD-10-CM | POA: Diagnosis not present

## 2021-08-15 DIAGNOSIS — I700 Atherosclerosis of aorta: Secondary | ICD-10-CM | POA: Diagnosis not present

## 2021-08-15 DIAGNOSIS — Z122 Encounter for screening for malignant neoplasm of respiratory organs: Secondary | ICD-10-CM | POA: Insufficient documentation

## 2021-08-15 DIAGNOSIS — Z87891 Personal history of nicotine dependence: Secondary | ICD-10-CM | POA: Diagnosis not present

## 2021-08-15 DIAGNOSIS — J439 Emphysema, unspecified: Secondary | ICD-10-CM | POA: Diagnosis not present

## 2021-08-22 ENCOUNTER — Other Ambulatory Visit: Payer: Self-pay | Admitting: Acute Care

## 2021-08-22 DIAGNOSIS — F17210 Nicotine dependence, cigarettes, uncomplicated: Secondary | ICD-10-CM

## 2021-08-22 DIAGNOSIS — Z87891 Personal history of nicotine dependence: Secondary | ICD-10-CM

## 2021-09-03 ENCOUNTER — Telehealth: Payer: Self-pay | Admitting: Acute Care

## 2021-09-03 DIAGNOSIS — L570 Actinic keratosis: Secondary | ICD-10-CM | POA: Diagnosis not present

## 2021-09-03 NOTE — Telephone Encounter (Signed)
I called the patient and he is aware that a copy of the results would be sent to the PCP. The patient told me he got the results on MyChart but no one has called him with the findings. Please advise.

## 2021-09-03 NOTE — Telephone Encounter (Signed)
I have called the patient with the results of his low dose CT Chest. It was read as a Lung RADS 2: nodules that are benign in appearance and behavior with a very low likelihood of becoming a clinically active cancer due to size or lack of growth. Recommendation per radiology is for a repeat LDCT in 12 months. I also explained that there was notation of CAD , he is not taking his Lipitor, so I have asked him to follow up with his PCP. Additionally I explained that there was notation of hepatic steatosis. I have asked that he follow up with his PCP regarding this also.   Denise, 12 month follow up LDCT. Thanks

## 2021-09-04 DIAGNOSIS — C61 Malignant neoplasm of prostate: Secondary | ICD-10-CM | POA: Diagnosis not present

## 2021-09-04 NOTE — Telephone Encounter (Signed)
CT results have been faxed to PCP. Order was placed for 12 mth f/u low dose ct.

## 2021-09-13 DIAGNOSIS — C61 Malignant neoplasm of prostate: Secondary | ICD-10-CM | POA: Diagnosis not present

## 2021-09-13 DIAGNOSIS — C775 Secondary and unspecified malignant neoplasm of intrapelvic lymph nodes: Secondary | ICD-10-CM | POA: Diagnosis not present

## 2021-09-13 DIAGNOSIS — N5201 Erectile dysfunction due to arterial insufficiency: Secondary | ICD-10-CM | POA: Diagnosis not present

## 2021-09-13 DIAGNOSIS — N393 Stress incontinence (female) (male): Secondary | ICD-10-CM | POA: Diagnosis not present

## 2021-09-13 DIAGNOSIS — Z20822 Contact with and (suspected) exposure to covid-19: Secondary | ICD-10-CM | POA: Diagnosis not present

## 2021-11-01 ENCOUNTER — Ambulatory Visit: Payer: BLUE CROSS/BLUE SHIELD

## 2021-11-01 DIAGNOSIS — J4540 Moderate persistent asthma, uncomplicated: Secondary | ICD-10-CM

## 2021-11-01 DIAGNOSIS — G4733 Obstructive sleep apnea (adult) (pediatric): Secondary | ICD-10-CM

## 2021-11-01 MED ORDER — EPINEPHRINE 0.3 MG/0.3ML IJ SOAJ
.3 mg | INTRAMUSCULAR | 1 refills | Status: AC | PRN
Start: 2021-11-01 — End: ?

## 2021-11-01 NOTE — Progress Notes
PULMONARY/SLEEP MEDICINE NOTE    PCP: Babs Sciara, MD      Chief Complaint   Patient presents with   ? Follow-up   ? Medication Reconciliation     REFERRING PRACTITIONER: Babs Sciara, MD  PRIMARY CARE PROVIDER: Babs Sciara, MD  CHIEF COMPLAINT: OSA and asthma follow up    History of Presenting Illness:  Maurice Ramirez?is a 71 y.o.?male?with  moderate persistent asthma, intermittent episodes of allergic bronchopulmonary aspergillosis , h/o LLL nodule (bx in 2012 benign), OSA on   PAP, and h/o afib s/p cardioversion.     Patient returns after 2 years away from clinic. He has been doing quite well. He still reports using his device every night, still wakes up with the mask off and does not recall why, but this is not every night, and on the whole, he denies fatigue, daytime sleepiness, snoring and morning headaches. Uses [employer] when he travels. He is exercising daily (brisk walk/run outside). He increases his exercise tolerance every year. No asthma exacerbations during this time. Denies shortness of breath, wheezing, chest tightness and dizziness. Compliant on his inhalers.     Asthma meds and hx:  Singulair 10mg  daily, advair 500/50 BID and albuterol (1x/week)  Last hospital visit for asthma was many years ago, intubated about >2yrs ago for a severe attack  Last use of prednisone dose for asthma (prior to sinus infections) was several years ago      COMPLIANCE DATA: No new data today   Data Range:?02/05/2016 to 05/04/2016  Days with Device Usage:?86?days  Days without Device Usage: 4?days   % Days with Device Usage: 95.6?%   Average Usage (days used):?4?Hrs 49?Mins 37?Secs  % Days with Usage >?= 4 hours: 56.7?%   AHI (Apnea Hypopnea Index):?3.4  Auto CPAP Mean Pressure:?7.5?cmH2O  Auto CPAP Peak Average Pressure:??9.9?cmH2O  Average Device Pressure <= 90% of Time: ?9.1?cmH2O  Average Time in Large Leak:?0?Hrs 5?Mins 26?Secs       Medications that the patient states to be currently taking   Medication Sig   ? ADVAIR DISKUS 500-50 MCG/DOSE diskus USE 1 INHALATION TWICE A DAY   ? MONTELUKAST 10 mg tablet TAKE 1 TABLET EVERY EVENING   ? montelukast 10 mg tablet Take 1 tablet (10 mg total) by mouth every evening.   ? PROAIR HFA 108 (90 Base) MCG/ACT inhaler    ? rosuvastatin 10 mg tablet Take 1 tablet (10 mg total) by mouth every morning.         PHYSICAL EXAM:  BP 122/56  ~ Pulse 67  ~ Temp 36.4 ?C (97.5 ?F) (Temporal)  ~ Resp 16  ~ Ht 5' 9'' (1.753 m)  ~ Wt 166 lb (75.3 kg)  ~ SpO2 97%  ~ BMI 24.51 kg/m?   General: Well-developed male, in no acute distress.   HEENT: Normocephalic and atraumatic  Pulm: CTAB  Neuro: Alert, oriented and appropriately interactive. No focal deficits. Speech fluent.   Psychological: normal affect    LABS:  Lab Results   Component Value Date    HGB 15.2 07/14/2018     No results found for: FERRITIN  Lab Results   Component Value Date    TSH 2.2 06/04/2011       IgE 1666 07/2018 (<-1422)  Imaging:  CT chest 2012  Unchanged biapical fibronodular pleural parenchymal scarring and calcified subcentimeter nodule within the right middle lobe (2-51), consistent with prior granulomatous disease.?  Persistent diffuse peribronchial and peribronchiolar thickening with associated airway irregularity and occasional  ectasia, consistent with chronic asthma/bronchitis, with scattered bronchocentric bands and architectural distortion within both upper, right middle and left   greater than right lower lobes, consistent with postinflammatory pleural parenchymal scarring.  Near-complete resolution of mass-like consolidation within the left lower lobe with residual architectural distortion (2-56). Several fluctuating subcentimeter nodules within the left lower lobe and resolved within the right lower lobe since 05/28/2011, likely airway-related inflammatory and mucus impaction.  ?  Pathology:  LLL nodule 05/2011  LUNG NODULE, LEFT LOWER LOBE (BIOPSY):  - Acute and organizing pneumonia (see Comment)  - Prominent chronic inflammation in interstitium  - Gram stain is negative for bacterial organisms  - GMS stain is negative for fungal organisms  - AFB stain is negative for acid-fast bacilli  - No neoplasms identified    PFT 12/2019  FVC  (preBD) 3.60/90% (postBD) 4.26/108%   FEV1 (preBD 1.22/40% (postBD) 1.60/52%  Has positive BD response   ?    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 Has been doing well with no issues. No compliance data recently, patient is due for a new device, so can assess data from new device.        Obstructive Sleep Apnea:  Continue with PAP therapy, the patient has acclimated well and is experiencing symptomatic improvement.  The patient is willing to continue with PAP therapy.  Recommended patient to register his Dreamstation with [employer]  New prescription for APAP provided (given machine is >66 years old)    Asthma  Well controlled  No change in therapy  Has dose of z pack, prednisone and epi pen in case of asthma exacerbation (will refill)  Repeat PFTs with next appointment      Follow up: ~6 months, sooner if needed.    Discussed with attending, Dr. Deanne Coffer.    Maurice Ramirez. Carolann Littler, MD  Pulmonary, Critical Care, and Sleep Medicine Fellow  11/01/2021 at 10:27 AM

## 2021-11-01 NOTE — Patient Instructions
- recommend registering your Dreamstation CPAP for the philips recall      - [email or web address]    - We have placed an order for a new Auto-CPAP (A Resmed device). Reach out to [place] in one week or so and we can work on ensuring you're able to get this.    - we will pursue pulmonary function testing prior to next visit.     - continue your excellent asthma control with fluticasone-salmeterol (Advair) 500-50 twice daily,  albuterol as needed, montelukast (Singulair) each evening.       SLEEP  MEDICINE WORKFLOW: NEXT STEPS    [facility]:     Dear Patient,    We look forward to helping evaluate and manage your sleep disorders. Here is a brief outline of the services we offer and details and terms describing these services:    Sleep Consultation: The sleep clinical consultation is a face-to-face/telemedicine/video consult with a sleep physician at which time it is determined what type of sleep study is needed to evaluate your sleep disorder. The Consult may be conducted by video/telemedicine or via a face- to -face visits at the [facility]:    [facility]  Sleep Disorders Clinic  [address] (Directions)  [place], [place] [address]  Phone: [phone]  Fax: [phone]  [email or web address]       Sleep Study, or a Polysomnogram:  Overnight, attended sleep studies are conducted in at the [facility] (Sleep Laboratory) You're monitored (or attended) all night by a trained sleep technologist. Your doctor may recommend a sleep study to diagnose or rule out a sleep disorder such as sleep apnea, narcolepsy, parasomnias or periodic limb movement disorder. You may also undergo a sleep study if you've already been diagnosed with a sleep disorder so that we can create or adjust your treatment plan with Continuous Positive Airway Pressure (CPAP).   There are three common types of overnight sleep studies:  Split night Sleep Study: is a combination of a diagnostic and CPAP titration The first half is used to diagnose sleep apnea and then midway through the night, CPAP is started. The sleep tech spends the rest of the night adjusting the pressure until breathing has been normalized.  Diagnostic Evaluation:  measures your sleep without any intervention. It's typically used to diagnose or rule out a disorder.  Continuous Positive Airway Pressure (CPAP) Titration: a study designed to optimize and adjust the setting on the CPAP machine to determine how much air pressure is needed to normalize your breathing.    [facility] (Sleep Laboratory)  [address], [address]  [place] [address]  Phone: Camelia Eng,  [phone]  Fax: [phone]  [email or web address]    Orma Flaming offers patients personalized and secure online access to portions of their health information. It enables patients to securely use the Internet to help manage and receive information about their health, request sleep visit appointments and obtain sleep study appointments and requests.  If you have any questions or need help with your account,  please call our Patient Support Line at [phone].       SLEEP STUDY WORKFLOW    The outline below provides a step-by-step process on how sleep clinical consultation and studies are arranged:      STEP 1: Sleep Physician Consultation    The Sleep Consultation will take place via telemedicine or in person at the [facility] Clinic at [facility], (Department of  Neurology) Phone [phone]  A Sleep Study may be ordered by your sleep physician following this consultation to help diagnose conditions such as sleep apnea, parasomnias, abnormal movement at night (REM sleep behavior disorder, periodic limb movements), and narcolepsy.    Determination will be made on whether the study will be conducted at the Sleep Laboratory (An In-Laboratory Nocturnal Polysomnogram vs. a Home Sleep Apnea Test, HSAT). Your insurance company may ultimately mandate this authorization.    STEP 2: Authorization for Your Sleep Study    An authorization from your insurance (for the sleep study) usually takes about two weeks to obtain, but may range from 1 week up to  4 weeks to process.   Once an authorization is granted, you will receive a MyChart Message or phone call from the Sleep Clinic letting you know whether your sleep study has been authorized and approved or whether another sleep test is authorized.  You will be given additional instructions to schedule your sleep study.    STEP 3: Scheduling Your Sleep Study    If you do not hear back about the authorization within 3-4 week timeframe, please send the sleep coordinator a MyChart Message, or call the Sleep Clinic (physician's office) at [phone].  Once you hear back from the Sleep Disorders Clinic that an authorization for a sleep study has been granted please call the [facility] Center (sleep test site) to schedule an appointment for a sleep study.  The phone number for the [facility] Disorder Center is [phone] Option 2.  If you are scheduled for a sleep study where positive airway pressure (PAP) may be used to treat sleep apnea, you will be required to undergo a COVID19 test 1-2 days prior to testing.    STEP 4: Arriving for Your Sleep Study at the [facility] Sleep Disorders Center    Your Sleep Study may take place at the [facility] Sleep Laboratory located at [address], [facility] [address], [place], [place] [address] ([place] area) in Parking [address].  [email or web address]    Our sleep laboratory facility on the [employer] is located within the Clinical and Runner, broadcasting/film/video (CTRC), in the Center of [facility] (previously the Emergency Room in [facility]):   [email or web address]    Directions and contact information:    [email or web address]  [email or web address]    [facility] Disorders [facility] (Testing Center)  Phone: [phone]]  Fax: [phone]  Address: [facility])  [address], [address]  [place] [address]  [In Visitor Parking Lot [address]]    STEP 5: Sleep Study Interpreted by Physician    Once your sleep study is complete, it will may take up to 2 weeks to score and interpret studies and report the results back to you. Please wait for a MyChart message or letter from your physician regarding the test results before scheduling a follow-up visit.  If you do not hear back from your sleep physician about your sleep study results within two weeks, please send your physician a MyChart Message, or call the Sleep Clinic (physician's office) at [phone].      STEP 6: Positive Airway Pressure (PAP) Prescription    If you have a diagnosis of sleep apnea and begin treatment with PAP during the Sleep Study, a prescription for PAP would be submitted to a dispensing facility referred to as ?Durable Medical Equipment (DME)? The DME company is determined by the patient's  medical insurance and is responsible for filling the physician's prescription to provide education, fitting and to dispense PAP.  The Sleep Coordinator will contact the patient via MyChart or by phone with the details about the next step when contacting the DME facility.   The PAP is dispensed by a [employer] (DME). To eventually own the PAP unit, your insurance provider would need to verify that you use and benefit from this treatment and have a follow up with your sleep physician once you successfully initiate     STEP 7: Durable Medical Equipment Company (DME)    After receiving the PAP prescription from the Physician, the DME will contact the patient to educate, dispense and set up the equipment.   The process (from authorization, processing, and dispensing PAP) may take between 20-to-40 days for PPO & HMO patients and between 60-to-90 days for Medi-Cal & Medicare. Some delays are inevitable due to insurance guidelines but have increased recently due to worldwide supply chain issues.   Insurance authorization for treatment is obtained by the DME company, not by Sleep Clinic/authorization team.  If any issues arise contact the Sleep Coordinator, at [phone], or send [place] a MyChart message.     STEP 8: Determining compliance with your PAP DME therapy:    After receiving your PAP, your sleep physician will work with you carefully to determine that the therapy is effective for you and is being used consistently and regularly. This is determined by monitoring your adherence with therapy and ensuring that your DME company provides your PAP ''download data,'' for review. Please work with your DME company to ensure that this data is available for your follow-up sleep clinics.  IMPORTANT: Once you begin C/PAP treatment you would need to be seen for follow-up within 30-90 days to monitor your adherence and success with therapy and identify opportunities to augment your success with therapy.  Questions regarding PAP education should be directed to your DME Supplier.  Questions regarding PAP mask fit, and pressure comfort should be directed to your sleep physician. (Please do not return your DME device without physician instruction.)  To ensure coverage for your PAP equipment, you must schedule a follow-up sleep visit with your sleep physician.   IMPORTANT: Your follow-up sleep visit should NOT occur earlier than 30 days or later than 90 days to ensure coverage for your equipment.    Billing and Insurance Questions    If you have questions regarding insurance, billing, and/or possible co?payments related to your sleep testing appointment, please contact your insurance company directly. Your insurance company has the most accurate information concerning your benefits for this procedure.     In addition to the facility charge for the sleep test, there will be an additional bill from the physician that interprets/reviews your sleep test (this is termed ?professional fee?).    For questions regarding insurance, billing, and/or possible co?payments related to your physician review of your sleep test, please contact your insurance company.    Current Procedural Terminology (CPT) Codes for Attended Polysomnography and Sleep Studies    Polysomnography; (age 72 years or older), sleep staging with 4 or more additional parameters of sleep, attended by a technologist: [phone]    Polysomnography with PAP; (age 72 years or older), sleep staging with 4 or more additional parameters of sleep, with the initiation of continuous positive airway pressure therapy or bi-level ventilation, attended by a technologist: [phone]    Multiple sleep latency or maintenance of wakefulness testing:  recording, analysis, and interpretation of physiological measurements of sleep during multiple trials to assess sleepiness or ability to remain awake: 62130    Current Procedural Terminology (CPT) Code for Home Sleep Apnea Testing:      Sleep study, unattended, simultaneous recording of heart rate, oxygen saturation, respiratory airflow and respiratory effort (e.g. thoracoabdominal movement): 95806 or [phone]    If you have any questions or concerns, please do not hesitate to send your physician a MyChart message, or call the clinic. We look forward to seeing you at your next visit and helping to address your sleep clinical needs.     [facility] Staff        [facility]    [facility] located at [address], [place], [place] ([place] area)      Our sleep laboratory facility on the [employer] is located within the Clinical and Runner, broadcasting/film/video (CTRC), in the Center of [employer] (previously the Emergency Room in [facility]). The lab will house eleven tranquil single-patient accommodations.    The lab also provides pediatric-friendly accommodations and a shared lounge, including a modern and elegant space, for patients and families.    A nationally accredited program with more than half a century experience in diagnosing and managing sleep disturbances, the [facility] is a recognized leader and pacesetter in the clinical practice of sleep medicine and sleep research. The Center's highly trained and experienced staff includes physicians who are Board Certified in Sleep Medicine and Registered Polysomnographic Technologists, who are proficient in evaluating and diagnosing a range of sleep difficulties, and providing patient education. The team designs personalized treatment programs to manage a broad spectrum of sleep disorders in adults and children including obstructive sleep apnea; excessive daytime sleepiness; snoring; insomnia; restless legs syndrome; parasomnias; narcolepsy; shift work sleep disorder; jet lag syndrome and various other sleep problems.    The [facility] contains 11 private bedrooms, and is equipped with modern equipment for digital polysomnography. Studies are performed 7 days per week. It is accredited by the [employer] of [employer].  The Laboratory is located at the Center for [facility], [address], [place] [address].        Phone: [phone],  [phone]  Fax: [phone]  Email: [email or web address]  Address  Sleep Laboratory  Center for [facility]   [address], [address]  [place] [address] (google map)    Phone: [phone]    Fax: [phone]    Email: [email or web address]    Directions:    From [address]    Take [address] exit  Turn left at [facility]  Turn right at [employer] (at the [employer])  Turn left on [address] ([employer] will be on the right)  Stay STRAIGHT to enter the TUNNEL towards Patient and Visitor Parking (lots 18 and 27)  Turn RIGHT at the stop sign to enter the Patient and Visitor Parking (Lot 27)  Park at any spot and proceed to the pay station prior to checking in for your appointment.  Current parking rates: All day - $13; 3 Hours - $9; 2 hours - $6; 1 hour - $3; Disabled placard - $6  Pay stations take Credit Card or exact change only. The Sleep Center does not provide change for parking.  If you get lost, please call the lab at [phone]      From [facility]  Take the [address] west towards [address] on the [address]  Take the [employer] exit  Turn left at [address]  Turn right at [employer] (at the [employer])  Turn left on [address] ([address] will be on the right)  Stay STRAIGHT to enter the TUNNEL towards Patient and Visitor Parking (lots 18 and 27)  Turn RIGHT at the stop sign to enter the Patient and Visitor Parking (Lot 27)  Park at any spot and proceed to the pay station prior to checking in for your appointment.  Current parking rates: All day - $13; 3 Hours - $9; 2 hours - $6; 1 hour - $3; Disabled placard - $6  Pay stations take Credit Card or exact change only. The Sleep Center does not provide change for parking.  If you get lost, please call the lab at [phone]    If you have any questions or concerns, please don't hesitate to send your physician an In Basket message, or call the clinic. We look forward to seeing you at your next visit and helping to address your sleep clinical needs.     Direction and contact information:  [email or web address]  [email or web address]       Before Your Study:    Your health care provider will tell you how to prepare. Ask if you should take your usual medications. Also:  Avoid napping.  Avoid caffeine and alcohol.  Take a shower and wash your hair (don?t use hair conditioner, hair spray, and skin lotions).  Eat dinner before you come to the sleep clinic. Pack a snack if you need one before bedtime.  Bring what will make you comfortable, such as your pajamas, robe, slippers, hygiene items, and even your own pillow.    What You Can Expect:    When you arrive at the sleep clinic, you will usually be checked in by a receptionist. A specialized sleep technologist will meet you in your room. Then you may change into your nightclothes. Small sensors are placed on your head and body with tape and gel. The sensors are then plugged into a machine that will monitor your sleep. If you need to use a restroom, the sensors can be unplugged. A camera in your room will record your body movements. The technologist will stay in a nearby room. If you need to talk to him or her, use the intercom.          What a Sleep Study Does:    A sleep study monitors all the stages of your sleep. To do this, the following are recorded:  Eye movements  Heart rate, brain waves, and muscle activity  Level of oxygen in your blood  Breathing and snoring  Sudden leg or body movements    If you have breathing problems, Continuous Positive Airway Pressure (CPAP) may be used. CPAP is a device that can help you breathe by adding mild air pressure to your airway passages and improve your sleep. It may be used during the second half of your study or on another night.    Getting Your Results    The technologist can answer some of your questions about the sleep study. But only your health care provider can explain the results. He or she will have the report of your sleep study within 1 to 2 weeks. Then your treatment options can be discussed with your health care provider, or you may be referred to a sleep disorders specialist.  ?  [address] [employer] [address] [place], [place] [address]. All rights reserved. This information is not intended as a substitute for professional medical care. Always follow your healthcare professional's instructions.    Monitoring Your Sleep: Sleep Lab Testing    Checking your sleep during a nighttime sleep study is often the only way to find out if you have conditions such as sleep apnea or other sleep problems. A sleep study records how your lungs, heart, brain, and other parts of your body function while you?re asleep. It?s painless, risk-free, and in most cases takes 1 full night.          Testing in a Sleep Laboratory:    If you spend the night in a Sleep Lab, you will have a private bedroom. A technician will attach many sensors to your body, then go into another room. As you sleep, your heart rate, breathing, oxygen level, brain activity, and other functions will be tracked. A microphone and video camera will record your breathing sounds and body movements. The technician will keep watch nearby. If you need an air pressure device to help you breathe, one will be available.    Tips for Testing in a Sleep Lab:    Before your sleep study, bathe and wash your hair. Don?t use conditioners, oils, or makeup.  Stick to your normal routine. If you usually drink alcohol, exercise, or take medication before bed, ask your health care provider whether you should do so the night of your study. Most patients undergoing a sleep study should take all of their medications as they typically would at home.   Bring your toothbrush, sleepwear, pillow, something to read, and anything else that will help you sleep well.    Getting the Results  The results of your sleep study need to be scored and interpreted. Once this is done, your health care provider will discuss the findings with you. The sleep study results will show whether you have sleep apnea. It can also tell how severe the apnea is. The findings help your health care provider know which treatment or treatments may be the right ones for you.  ? 2000-2015 [employer] [address], [place], [place] [address]. All rights reserved. This information is not intended as a substitute for professional medical care. Always follow your healthcare professional's instructions.      Sleep Apnea [Adult]    Sleep Apnea (also called ?Obstructive Sleep Apnea?) is a condition where there are long pauses between breaths during sleep. This usually occurs when the tissues and muscles in the back of the throat relax too much during sleep. This causes the air passage in your throat to narrow or block off completely. Breathing slows down or stops completely and you then wake up, take a few good breaths and fall back to sleep again. There may be 10-60 such awakenings during a night. This prevents you from getting to the deeper stages of sleep that are needed for the body to rest and recover its strength.  During sleep, loud snoring, noisy breathing or gasping sounds are common. The sleep disturbance causes daytime symptoms such as difficulty getting up in the morning, need for daytime naps, irritability, poor concentration and attention.__    Causes Of Sleep Apnea:           The main risk factor for this kind of sleep apnea is excessive weight gain. Fatty tissue gathers along the sides of the throat causing the air passage  to be more narrow than normal.  Increasing age is another factor due to softening of the air passage.  Certain neck and jaw shapes are prone to a narrow air passage (such as receding chin)  Enlarged tonsils and adenoids may block the air passage when lying down  Severe nasal congestion  Use of alcohol or sedatives relaxes the muscles in the throat.  Smoking can cause inflammation and swelling in the upper air passages and make this problem worse.    Home Care  Sleep with your head and neck in a straight or neutral position. If the neck falls back or forward too far this can block breathing.  Limit the use of alcohol and sedatives in the evening.        Follow Up  with your doctor as advised. If you are overweight, talk to your doctor about a weight loss program. If you smoke, talk to your doctor about ways to help you stop.    Get Prompt Medical Attention  if any of the following occur:  Longer pauses than usual  Unable to awaken  Seizure  ? [address] [employer]. [address], [place], [place] [address]. All rights reserved. This information is not intended as a substitute for professional medical care. Always follow your healthcare professional's instructions.        Snoring and Sleep Apnea: Notes for a Partner  Snoring and sleep apnea affect your life, as well as your partner?s. You can help in the treatment of the problem. Be supportive. Encourage your partner both to get treatment and to make the adjustments needed to follow through.    Adjusting to Changes:    Your partner?s treatment may involve making changes to certain life habits. You can help your partner make and stick with these changes. For example:  Support and even join your partner?s exercise program.  Be supportive if your partner gets CPAP (continuous positive airway pressure). He or she may feel self-conscious at first. Remind your partner to expect adjustments to CPAP before it feels just right.  Consider joining a snoring and sleep apnea support group.    Go Along to See the Health Care Provider  You can give the health care provider the best account of your partner?s nighttime breathing and snoring patterns. Try to go along to health care provider?s appointments. If you can?t go, write notes for your partner to give to the health care provider. Describe your partner?s snoring and sleep breathing patterns in detail.    Tips for Sleeping with a Snorer  Until treatment takes care of your partner?s snoring:  Try to go to bed first. It may help if you?re already asleep when your partner starts to snore.  Wear earplugs to bed. A fan or other source of background noise may also help drown out snoring.   ? 2000-2015 [employer]. [address], [place], [place] [address]. All rights reserved. This information is not intended as a substitute for professional medical care. Always follow your healthcare professional's instructions.        Continuous Positive Air Pressure (CPAP):    Continuous positive air pressure (CPAP) uses gentle air pressure to hold the airway open. CPAP is often the most effective treatment for sleep apnea and severe snoring. It works very well for many people. But keep in mind that it can take several adjustments before the setup is right for you.        How CPAP Works  CPAP is a small portable pump beside the bed. The pump sends air through a hose, which is held over your nose and/or mouth by a mask. Mild air pressure is gently pushed through your airway. The air pressure nudges sagging tissues aside. This widens the airway so you can breathe better. CPAP may be combined with other kinds of therapy for sleep apnea.      There are multiple mask options to choose from:    NASAL CPAP       FILL FACE MASK (when CPAP setting is high or to control mouth breathing)      NASAL MASK:        CPAP SPLITS THE AIRWAYS OPEN:              Types of Air Pressure Treatments    There are different types of CPAP. Your doctor or CPAP technician will help you decide which type is best for you:  Basic CPAP keeps the pressure constant all night long.  A bilevel device (BiPAP) provides more pressure when you breathe in and less when you breathe out. A BiPAP machine also may be set to provide automatic breaths to maintain breathing if you stop breathing while sleeping.  An autoCPAP device automatically adjusts pressure throughout the night and in response to changes such as body position, sleep stage, and snoring.  ? 2000-2015 [employer] [address], [place], [place] [address]. All rights reserved. This information is not intended as a substitute for professional medical care. Always follow your healthcare professional's instructions.      MANDIBULAR ADVANCEMENT DEVICES FOR OSA     For simple snoring or mild to moderate apnea, a special mouthpiece may help. A dental specialist works with your health care provider to build and fit a mouthpiece just for you. A follow-up sleep study checks how well the device is working for you. Mouthpieces are also called oral appliances.          Moving the Jaw Forward    Most mouthpieces move the jaw and tongue forward. That keeps the tongue from blocking the airway. Mouthpieces can work well, but they are not for everyone. Work with your health care provider to get a mouthpiece that fits just right for you. And avoid over-the-counter mouthpieces -- they often do not work.    Tips  To have the most success with your mouthpiece, keep these tips in mind:  It will take some time to get used to wearing a mouthpiece. At first it may feel uncomfortable or make your mouth water. If these problems last, tell your health care provider.  Expect several rounds of adjustments to get the mouthpiece to fit and work just right for you.  Mouthpieces don?t cure the problems that cause snoring or sleep apnea. So you need to use your mouthpiece all night, every night.  Follow your health care provider?s instructions for keeping the mouthpiece clean.  When you?re not wearing your mouthpiece, store it in its case.  ? 2000-2015 [employer] [address], [place], [place] [address]. All rights reserved. This information is not intended as a substitute for professional medical care. Always follow your healthcare professional's instructions.      Surgical Treatment for Snoring and Sleep Apnea  The goal of most surgeries for breathing problems is to widen the airway. This is done by taking out or shrinking excess tissue where the mouth meets the throat. Nasal and jaw surgery can help correct  nose or jaw problems that contribute to snoring and apnea. This sheet describes procedures that may be recommended for you.    This is the most common surgical procedure for sleep apnea. It trims the soft palate and uvula, and removes the tonsils and other tissue. It is major surgery performed in a hospital. Most patients go home within 24 hours.    NASAL SURGERY:    Problems in the nose can make snoring or sleep apnea worse. They can also make CPAP (a common treatment for snoring and sleep apnea) harder to use. If blockages in your nose are severe, surgery can improve the airflow. It can reduce the size of the turbinates, straighten a deviated septum, and remove any polyps (overgrowths of sinus lining).    Risks and Complications of Nasal Surgery  Bruising  Bleeding  Damage to or perforation of septum  Dryness in nose      Snoring and Sleep Apnea: Notes for a Partner  Snoring and sleep apnea affect your life, as well as your partner?s. You can help in the treatment of the problem. Be supportive. Encourage your partner both to get treatment and to make the adjustments needed to follow through.    Adjusting to Changes    Your partner?s treatment may involve making changes to certain life habits. You can help your partner make and stick with these changes. For example:  Support and even join your partner?s exercise program.  Be supportive if your partner gets CPAP (continuous positive airway pressure). He or she may feel self-conscious at first. Remind your partner to expect adjustments to CPAP before it feels just right.  Consider joining a snoring and sleep apnea support group.    Go Along to See the Health Care Provider    You can give the health care provider the best account of your partner?s nighttime breathing and snoring patterns. Try to go along to health care provider?s appointments. If you can?t go, write notes for your partner to give to the health care provider. Describe your partner?s snoring and sleep breathing patterns in detail.    Tips for Sleeping with a Snorer  Until treatment takes care of your partner?s snoring:  Try to go to bed first. It may help if you?re already asleep when your partner starts to snore.  Wear earplugs to bed. A fan or other source of background noise may also help drown out snoring.   ? 2000-2015 [employer]. [address] [place], [place] [address]. All rights reserved. This information is not intended as a substitute for professional medical care. Always follow your healthcare professional's instructions.    SLEEP APNEA: NATURAL HISTORY    What Are Snoring and Sleep Apnea?    Sleep Apnea [Adult]    What is sleep apnea? -- Sleep apnea is a condition that makes you stop breathing for short periods while you are asleep. There are 2 types of sleep apnea. One is called ''obstructive sleep apnea,'' and the other is called ''central sleep apnea.''    In obstructive sleep apnea, you stop breathing because your throat narrows or closes (figure 1). In central sleep apnea, you stop breathing because your brain does not send the right signals to your muscles to make you breathe. When people talk about sleep apnea, they are usually referring to obstructive sleep apnea, which is what this article is about.    People with sleep apnea do not know that they stop breathing when they are asleep. But  they do sometimes wake up startled or gasping for breath. They also often hear from loved ones that they snore.    What are the symptoms of sleep apnea? -- The main symptoms of sleep apnea are loud snoring, tiredness, and daytime sleepiness. Other symptoms can include:    ?Restless sleep  ?Waking up choking or gasping  ?Morning headaches, dry mouth, or sore throat  ?Waking up often to urinate  ?Waking up feeling unrested or groggy  ?Trouble thinking clearly or remembering things  Some people with sleep apnea don't have symptoms, or they don't know they have them. They might figure that it's normal to be tired or to snore a lot.    Should I see a doctor or nurse? -- Yes. If you think you might have sleep apnea, see your doctor.    Is there a test for sleep apnea? -- Yes. If your doctor or nurse suspects you have sleep apnea, he or she might send you for a ''sleep study.'' Sleep studies can sometimes be done at home, but they are usually done in a sleep lab. For the study, you spend the night in the lab, and you are hooked up to different machines that monitor your heart rate, breathing, and other body functions. The results of the test will tell your doctor or nurse if you have the disorder.    Is there anything I can do on my own to help my sleep apnea? -- Yes. Here are some things that might help:    ?Stay off your back when sleeping. (This is not always practical, because people cannot control their position while asleep. Plus, it only helps some people.)  ?Lose weight, if you are overweight  ?Avoid alcohol, because it can make sleep apnea worse  How is sleep apnea treated? -- As mentioned above, weight loss can help if you are overweight or obese. But losing weight can be challenging, and it takes time to lose enough weight to help with your sleep apnea. Most people need other treatment while they work on losing weight.    The most effective treatment for sleep apnea is a device that keeps your airway open while you sleep. Treatment with this device is called ''continuous positive airway pressure,'' or CPAP. People getting CPAP wear a face mask at night that keeps them breathing (figure 2).    If your doctor or nurse recommends a CPAP machine, try to be patient about using it. The mask might seem uncomfortable to wear at first, and the machine might seem noisy, but using the machine can really pay off. People with sleep apnea who use a CPAP machine feel more rested and generally feel better.    There is also another device that you wear in your mouth called an ''oral appliance'' or ''mandibular advancement device.'' It also helps keep your airway open while you sleep.    In rare cases, when nothing else helps, doctors recommend surgery to keep the airway open. Surgery to do this is not always effective, and even when it is, the problem can come back.    Is sleep apnea dangerous? -- It can be. People with sleep apnea do not get good-quality sleep, so they are often tired and not alert. This puts them at risk for car accidents and other types of accidents. Plus, studies show that people with sleep apnea are more likely than others to have high blood pressure, heart attacks, and other serious heart problems. In people with severe sleep  apnea, getting treated (for example, with a CPAP machine) can help prevent some of these problems.  Airway in a person with sleep apnea        Normally when a person sleeps, the airway remains open, and air can pass from the nose and mouth to the lungs. In a person with sleep apnea, parts of the throat and mouth drop into the airway and block off the flow of air. This can cause loud snoring and interrupt breathing for short periods.    Sleep Apnea (also called ?Obstructive Sleep Apnea?) is a condition where there are long pauses between breaths during sleep. This usually occurs when the tissues and muscles in the back of the throat relax too much during sleep. This causes the air passage in your throat to narrow or block off completely. Breathing slows down or stops completely and you then wake up, take a few good breaths and fall back to sleep again. There may be 10-60 such awakenings during a night. This prevents you from getting to the deeper stages of sleep that are needed for the body to rest and recover its strength.  During sleep, loud snoring, noisy breathing or gasping sounds are common. The sleep disturbance causes daytime symptoms such as difficulty getting up in the morning, need for daytime naps, irritability, poor concentration and attention.    Causes Of Sleep Apnea   The main risk factor for this kind of sleep apnea is excessive weight gain. Fatty tissue gathers along the sides of the throat causing the air passage to be more narrow than normal.  Increasing age is another factor due to softening of the air passage.  Certain neck and jaw shapes are prone to a narrow air passage (such as receding chin)  Enlarged tonsils and adenoids may block the air passage when lying down  Severe nasal congestion  Use of alcohol or sedatives relaxes the muscles in the throat.  Smoking can cause inflammation and swelling in the upper air passages and make this problem worse.    Home Care  Sleep with your head and neck in a straight or neutral position. If the neck falls back or forward too far this can block breathing.  Limit the use of alcohol and sedatives in the evening.  Follow Up  with your doctor as advised. If you are overweight, talk to your doctor about a weight loss program. If you smoke, talk to your doctor about ways to help you stop.    Get Prompt Medical Attention  if any of the following occur:  Longer pauses than usual  Unable to awaken  Seizure  ? [address] [employer] [address], [place], [place] [address]. All rights reserved. This information is not intended as a substitute for professional medical care. Always follow your healthcare professional's instructions.    bstructive sleep apnoea is a chronic condition with multiple potential associations with cardiometabolic sequelae. There is increasing awareness and recognition of OSA in our society today, because of the accumulating evidence of its contribution to atheroslerosis45. It has been considered as a systemic problem or a clinical manifestation of the metabolic syndrome, comprising a cluster of cardiometabolic risk factors, namely, hypertension, insulin resistance, dyslipidaemia and obesity46. Early observational studies in the 1980s, looked for the prevalence of OSA in different ethnic populations, and some longitudinal studies became more informative in time to define the natural history and associated risk factors with an increased prevalence in different subsets of the population. The 4-year  [place] Sleep Cohort [phone] and the recent Sleep Heart Health [phone] were the landmark studies of the impact of body weight changes on the severity of sleep apnoea. The overall incidence of moderate to severe OSA over a 5-year period was 11.1 per cent in men and 4.9 in per cent women. Men with >10 kg weight gain over the follow up period had five-fold risk of increasing their AHI by > 15. In contrast, for the same amount of weight gain, women had two and half fold risk of a similar increment in their severity of sleep apnoea47. given the epidemic of obesity, and different cohorts of the effects of body weight changes on OSA, OSA patients are likely to be overweight or obese at presentation48. Hence, obesity does have a major impact on the evolution of sleep apnoea.  There have been increasing interest in the research on OSA and its cardiometabolic complications during the last 10 years. OSA is considered to be a long- standing illness and the associated complications seem to impose an economic burden in our society, affecting both developing and developed countries all over the world. There is evidence that OSA is associated with ischaemic heart disease, hypertension, stroke, arrhythmia, coagulability, diabetes mellitus, endothelial dysfunction and inflammation49, and the implications of future research in these areas are highly encouraged in order to look into the general public health burden.      Obstructive sleep apnea, or simply sleep apnea, can cause fragmented sleep and low blood oxygen levels. For people with sleep apnea, the combination of disturbed sleep and oxygen starvation may lead to hypertension, heart disease and mood and memory problems. Sleep apnea also increases the risk of drowsy driving.    Who Has Sleep Apnea?  More than 18 million [place] adults have sleep apnea. It is very difficult at present to estimate the prevalence of childhood OSA because of widely varying monitoring techniques, but a minimum prevalence of 2 to 3% is likely, with prevalence as high as 10 to 20% in habitually snoring children. OSA occurs in all age groups and both sexes.    What Causes Sleep Apnea?  There are a number of factors that increase risk, including having a small upper airway (or large tongue, tonsils or uvula), being overweight, having a recessed chin, small jaw or a large overbite, a large neck size (17 inches or greater in a man, or 16 inches or greater in a woman), smoking and alcohol use, being age 48 or older, and ethnicity (African-Americans, Pacific-Islanders and Hispanics). Also, OSA seems to run in some families, suggesting a possible genetic basis.    Sleep Apnea Symptoms  Chronic snoring is a strong indicator of sleep apnea and should be evaluated by a health professional. Since people with sleep apnea tend to be sleep deprived, they may suffer from sleeplessness and a wide range of other symptoms such as difficulty concentrating, depression, irritability, sexual dysfunction, learning and memory difficulties, and falling asleep while at work, on the phone, or driving. Left untreated, symptoms of sleep apnea can include disturbed sleep, excessive sleepiness during the day, high blood pressure, heart attack, congestive heart failure, cardiac arrhythmia, stroke or depression.    Treatment for Sleep Apnea  If you suspect you may have sleep apnea, the first thing to do is see your doctor. Bring with you a record of your sleep, fatigue levels throughout the day, and any other symptoms you might be having. Ask your bed partner if he or she notices that you snore heavily,  choke, gasp, or stop breathing during sleep. Be sure to take an updated list of medications, including over the counter medications, with you any time you visit a doctor for the first time. You may want to call your medical insurance provider to find out if a referral is needed for a visit to a sleep center.    One of the most common methods used to diagnose sleep apnea is a sleep study, which may require an overnight stay at a sleep center. The sleep study monitors a variety of functions during sleep including sleep state, eye movement, muscle activity, heart rate, respiratory effort, airflow, and blood oxygen levels. This test is used both to diagnose sleep apnea and to determine its severity. Sometimes, treatment can be started during the first night in the sleep center.    The treatment of choice for obstructive sleep apnea is continuous positive airway pressure device (CPAP). CPAP is a mask that fits over the nose and/or mouth, and gently blows air into the airway to help keep it open during sleep. This method of treatment is highly effective. Using the CPAP as recommended by your doctor is very important.    Other methods of treating sleep apnea include: dental appliances which reposition the lower jaw and tongue; upper airway surgery to remove tissue in the airway; nasal expiratory positive airway pressure where a disposable valve covers the nostrils; and treatment using hypoglossal nerve stimulation where a stimulator is implanted in the patient?s chest with leads connected to the hypoglossal nerve that controls tongue movement as well as to a breathing sensor. The sensor monitors breathing patterns during sleep and stimulates the hypoglossal nerve to move the tongue to maintain an open airway.    Lifestyle changes are effective ways of mitigating symptoms of sleep apnea. Here are some tips that may help reduce apnea severity:    Lose weight. If you are overweight, this is the most important action you can take to cure your sleep apnea (CPAP only treats it; weight loss can cure it in the overweight person).  Avoid alcohol; it causes frequent nighttime awakenings, and makes the upper airway breathing muscles relax.  Quit smoking. Cigarette smoking worsens swelling in the upper airway, making apnea (and snoring) worse.  Some patients with mild sleep apnea or heavy snoring have fewer breathing problems when they are lying on their sides instead of their backs.    Coping with Sleep Apnea  The most important part of treatment for people with OSA is using the CPAP whenever they sleep. The health benefits of this therapy can be enormous, but only if used correctly. If you are having problems adjusting your CPAP or you?re experiencing side effects of wearing the appliance, talk to the doctor who prescribed it and ask for assistance.    Getting adequate sleep is essential to maintaining health in OSA patients. If you have symptoms of insomnia such as difficulty falling asleep, staying asleep, or waking up unrefreshed, talk to your doctor about treatment options. Keep in mind that certain store-purchased and prescription sleep aids may impair breathing in OSA patients. One exception is ramelteon, which was studied in mild and moderate OSA patients and found to not harm their breathing.    \  Continuous Positive Airway Pressure (CPAP)  Your healthcare provider has prescribed continuous positive airway pressure (CPAP) therapy for you. A CPAP device helps you breathe better at night. The device sends air through your nose or mouth when you breathe in to keep your  air passages open. CPAP is:  Used most often to treat sleep apnea and some other problems. (Sleep apnea is a chronic condition with periods of sleep in which you briefly stop breathing.)  Safe and very effective. But it takes time to get used to the mask.  Your healthcare provider, nurse, or medical supplier will give you tips for wearing and caring for your CPAP device.        General guidelines  Recommendations include the following:  It's very important not to give up! It takes time to get used to wearing the mask at night.  Practice using your CPAP device during the day, especially whenever you take a nap.  Remember, there are several different types of masks. If you can?t get used to your mask, ask your provider or medical supply company about trying another style.  If you have nasal stuffiness or dryness when using your CPAP device, talk with your provider or medical supply company. There are ways to ease these problems. For example, your provider may recommend using a moistening nasal spray. Or the medical supply company may recommend a device with a humidifier.  The goal is to use your CPAP all night, every night, during all naps, and even when you travel.  Keep your mask clean. Wash it with soap and water. Be sure to rinse the mask and tubing well with water to remove any soap. Let them air-dry completely before using.  Make yourself comfortable when sleeping with CPAP. Try using extra pillows.  Work with your medical supply company so that you know how to correctly use your CPAP. The company's representative will be able to help you:  Use the CPAP correctly  Troubleshoot any problems that come up  Learn to clean and maintain the device  Adjust to regular use of the CPAP     The CPAP device settings are given as centimeters of water, or cm/H2O. Each person?s pressure settings are different. Your healthcare provider will tell you what settings to use. Never change your CPAP pressure setting unless your provider tells you to.  CPAP ____________cm/H20 pressure when you breathe in       Continuous positive airway pressure (CPAP) is the frontline treatment for obstructive sleep apnea. CPAP keeps your airway open during the night by gently providing a constant stream of air through a mask you wear while you sleep. This will eliminate the breathing pauses caused by sleep apnea, so you will no longer snore or make choking noises in your sleep. You will be able to sleep through the night without your body waking up from lack of oxygen.     When you use CPAP, you will feel more alert during the daytime. Your mood will improve and you will have a better memory. CPAP prevents or even reverses serious health problems associated with sleep apnea such as heart disease and stroke. Your partner may even sleep better because you will stop snoring.     CPAP comes with a machine, a hose for air and a mask. Most machines are small - about the size of a tissue box - lightweight and relatively quiet. You can keep the CPAP machine on your nightstand or at the side of your bed.     A long hose connects the CPAP machine to the mask. Air travels from the machine?s motor, through the hose and into the mask. Most hoses are long so that you can move around or turn over in your bed.  The CPAP mask may cover just your nose, your nose and mouth or fit in your nostrils. No matter what type of mask you use, it is important that it fits well and is comfortable. The mask must make a seal in order to keep your airway open through the night. A good mask seal will prevent air leaks and maintain the right level of air pressure.     The amount of air pressure need for CPAP to treat sleep apnea depends on the person. A board certified sleep medicine physician may recommend a CPAP titration study to calibrate your CPAP. Most CPAP units also come with a timed pressure ?ramp? setting. This starts the airflow at a very low level, so you can fall asleep comfortably. The setting then slowly raises the pressure while you sleep until it reaches the right level to treat your sleep apnea.     CPAP is a lifestyle change. It works best when used every night, for the whole time you are sleeping. You should also use CPAP when you are napping. Just one night without the treatment can negatively affect your blood pressure. The more you use CPAP, the better you will feel.    Health Risk Prevention    CPAP can prevent or reverse serious consequences of obstructive sleep apnea. The treatment can help protect you from these serious health risks:     Heart disease    By treating your sleep apnea, you can reduce your risk of heart disease. Sleep apnea is linked to a variety of heart problems because it causes you to stop breathing many times each night. These pauses cause changes in the pressure on your heart and can cause changes in your blood oxygen levels. This puts an enormous strain on your heart.      People with untreated sleep apnea have a higher rate of death from heart disease than those without sleep apnea or with treated sleep apnea. Using CPAP over an extended period of time can protect you from heart problems and reduce your chance of dying from them. This includes:  Congestive heart failure  Coronary artery disease  Irregular heartbeat    Stroke    If you have sleep apnea, consistent CPAP use can reduce your risk of stroke, one of the leading causes of death and long-term disability. A stroke is a sudden loss in brain function. It occurs when there is a blockage or rupture in one of the blood vessels leading to the brain. People with untreated sleep apnea are two to four times more likely to have a stroke.     Diabetes    Using CPAP to treat your sleep apnea can improve insulin sensitivity. Sleep apnea is related to glucose intolerance and insulin resistance, both factors in type 2 diabetes. Untreated sleep apnea increases your risk of getting type 2 diabetes.     Motor Vehicle Accidents    CPAP can help you become a safer driver by reducing your daytime fatigue. Untreated sleep apnea makes you 15 times more likely to be involved in a deadline crash. Many people with sleep apnea have a hard time staying awake and concentrating while driving.     Benefits to Your Health and Well-Being     Using CPAP to treat your sleep apnea can improve your life and make each day better. It can help improve your:     Daytime Alertness    Sleepiness and daytime fatigue are common symptoms of sleep apnea. CPAP can  restore your normal sleep pattern and increase your total sleep time by eliminating breathing pauses in your sleep. This will help you wake up feeling more refreshed and boost your energy throughout the day.    CPAP - Side Effects        CPAP has relatively few side effects. Most of the problems people experience happen after they first begin the treatment, and are easily fixed through simple adjustments:     Irritation of the eyes and face    This is often due to a poor mask fit. By readjusting or switching the type of mask that you use, you can eliminate these symptoms.     Dry nose and sore throat    A humidifier attached to your CPAP unit reduces dry nose and sore throat by providing cool or heated moisture to the air that blows down your throat.     Nasal congestion, runny nose and sneezing    Use a saline nasal spray to ease mild nasal congestion. A nasal decongestant can help will more severe nasal or sinus congestion. In severe cases, prescription medications can be used to help adjust to CPAP.     Nightmares and excessive dreaming    This typically occurs only during the early stages of CPAP use.     Most other side effects are relatively rare.       Concentration    Untreated sleep apnea can make you feel cloudy-headed because it interferes with your cognitive functioning. Using CPAP may improve your ability to think, concentrate and make decisions. This can also improve your productivity and decrease your chance of making a costly mistake at work.      Emotional Stability    Untreated sleep apnea is linked with depression. CPAP can help improve your mood, reduce your risk of depression and improve your overall quality of life.     Snoring    By keeping your airway open as you sleep, CPAP can also reduce or eliminate the sound of your snoring. While you may not notice, you bed partner will benefit from a quieter sleep environment.      Medical Expenses    People with untreated sleep apnea usually have higher medical expenses. Treatment with CPAP can reduce medical expenses, even when you factor in the cost of CPAP. Sleep apnea can lead to more health problems and more doctors? visits. Treatment for serious health risks linked to sleep apnea such as heart disease, stroke and diabetes can be costly. Medical expenses will decrease when you use CPAP to treat your sleep apnea.    Types of PAP therapy    There are several forms of PAP therapy other than CPAP. All forms of PAP therapy keep the airway open during the night by providing a stream of air:    APAP  Automatically-adjusting Positive Airway Pressure (APAP) therapy automatically raises or lowers the air pressure as needed during the night.      BPAP  BPAP devices have two alternating levels of pressure. When you breathe in air the pressure rises. The pressure decreases as you breathe out. If you have a problem with CPAP or APAP, the board-certified sleep medicine physician may recommend BPAP. He or she may also suggest BPAP if you have sleep apnea along with another breathing disorder.      Types of CPAP Masks      There are three common types of masks for CPAP. No matter what type of mask you use, it  is important that it fits well and is comfortable.    Nasal mask  This mask only covers your nose. This is the most common type of CPAP mask.       Full face mask  This mask covers both your nose and your mouth. This type of mask may help if you have air leaks when using a nasal mask.      Nasal pillows  This mask uses soft silicone tubes that fit directly in your nose. This may help if you have air leaks or don?t like the feeling of a mask over your nose and face. Some users claim nasal pillows give them a better sense of freedom.      Humidifiers     Humidifiers for CPAP can help reduce side effects associated with the treatment and make it easier for you to breathing through your mask. Some people may have nasal irritation or drainage from using CPAP. A humidifier can reduce these side effects by providing cool or heated moisture to the air coming from the CPAP unit. Many modern CPAP units come with a humidifier connected with the machine.    Tips for CPAP        It may take some time for you to become comfortable with using CPAP. Follow these tips to improve your quality of sleep with CPAP:    Begin using your CPAP for short periods of time during the day while you watch TV or read    This will help you get used to wearing your mask. It will feel more natural when you are trying to fall asleep.     Make CPAP part of your bedtime routine.    Use CPAP every night and for every nap. Using CPAP less often reduces its health benefits and makes it more difficult for your body to adjust to the therapy.    Make small adjustments to increase your level of comfort    Adjust your mask, tubing straps and headgear until you get the fit right. You can also try using a special bed pillow that is shaped for a PAP mask and/or hose.     Make sure your mask is a good fit. The most common problems with CPAP occur when the mask does not fit properly.    If the mask is too big, the straps holding it to your face will need to be pulled tightly. This may irritate your skin or lead to sores as the straps rub against your face. A mask that is too small will not seal properly and air will leak out through the edges. The air may blow into your eyes. If you are having either problem, you may need a different mask or headgear.    If the pressure feels too high as you are trying to fall asleep, use the ?ramp? mode on your CPAP unit    Ramp mode will start your device on a low pressure setting and gradually increases the pressure over time. You should be able to fall asleep before the air pressure reaches its proper level.     Use a saline nasal spray to ease mild nasal congestion    Nasal congestion can be a problem with CPAP treatment. A nasal spray or decongestant can help with nasal or sinus congestion.     Use a humidifier if you have a dry mouth, throat or nose    Some CPAP devices have heated humidifiers, which are chambers filled with water on  a heater-plate. This feature ensures that you are breathing warm, moist air through your mask. If you are using a heated humidifier and the tubing fills with water, turn down the heat on the humidifier and keep the PAP machine at a level lower than your head.     Place foam under your CPAP machine to dampen the sound    This may help if you find the sound of the CPAP machine to be annoying. A mouse pad also works great as a Therapist, nutritional.     Schedule a regular time to clean your equipment    Clean your mask, tubing and headgear once a week. Put this time in your schedule so that you don?t forget to do it.     If you are having problems remembering to use your CPAP every night, find someone to help    Considering joining a support group or asking someone you trust to hold you accountable for using your CPAP.     If these adjustments do not work, consult your board-certified sleep medicine physician.    You may need a different mask or device or you may need the air pressure adjusted. Some patients may also benefit from cognitive-behavioral therapy. This can help you identify and overcome what is preventing you from getting a good night?s sleep with CPAP.      PAP Cleaning Equipment Instructions    Mask:  Clean once a week at a minimum with mild soap and water and let air dry  Replace cushion every 4-6 weeks    Humidifier Chamber:  Clean once a week at a minimum with mild soap and water and let air dry  Use distilled water only while using during sleep    Tubing:  Clean once a week at a minimum with mild soap and water  Hang dry    Filters:    1. [employer] Respironics:  Dark Blue Non Disposable Filter:   Rinse at least once a week with soap/water  Replace every 3-6 months  Disposable Filter (light blue or purple):   Replace every 4-6 weeks   2. [employer] Respironics:  Paulette Blanch:  Rinse at least once a week with soap/water  Replace every 3-6 months  Disposable Filter (white):   Replace every 4-6 weeks   3. ResMed Airsense 10 Auto Set and S9 Auto Set:  Change every 4-6 weeks    General Recommendations   Use warm soapy water to clean your equipment  Ivory liquid soap or [employer]  Once a month soak mask and water chamber for 30 minutes with 1 part vinegar and 2 parts water    What about surgery?      While positive airway pressure therapy is the first line and gold standard of treatment for sleep apnea, in very selected cases, surgery may have a role.  The sites of obstruction could be anywhere in the upper respiratory tract including the nose, tongue, and throat.  Below are the most common  surgical methods to address these potential sites of obstruction.    Nasal surgery    Both daytime nasal obstruction and nocturnal nasal congestion have been shown as risk factors for sleep-disordered breathing. Therefore, the treatment of nasal obstruction plays an important role in sleep apnea surgery.  Three anatomic areas of the nose that may contribute to obstruction are the septum, the turbinates, and the nasal valve. The most common nasal surgical procedure consists of septoplasty and turbinate reduction.  This is  an outpatient procedure that is well tolerated by most patients. It consists of straightening out the septum and reducing the size of the turbinates.  This procedure creates more room in the nose and allows air to pass smoothly and without effort.  For some patients, there is also nasal valve collapse. This is due to weakness of the lower nasal cartilages that hold open the nostrils. For patients who have this issue, the deviated cartilage that is removed from the septum can be strategically placed to strengthen the valve and prevent collapse.    UPPP    UPPP, or in full, uvulopalatopharyngoplasty was developed to remove excess tissue from the soft palate and pharynx.  The tonsils are also removed if present.  After removing the tissue, sutures are placed to keep the area widely open and prevent collapse. This area of the upper airway is referred as the oropharynx, and is a common site of obstruction in the majority of patients who suffer from sleep apnea.   This surgery requires an overnight stay in the hospital, as the recovery can be painful for up to one week.  This procedure does NOT guarantee a cure from sleep apnea and many patient continue to use their CPAP.    Soft palate implants (the Pillar Procedure)    The Pillar Procedure is a minimally invasive approach that can help with snoring and VERY mild cases of sleep apnea. It involves the placement of three polyester rods into the soft palate.  The rods initiate an inflammatory response of the surrounding soft tissues that results in a slight stiffening of the soft palate. The stiffer soft palate is less likely to make contact with the back wall of the pharynx during deep stages of sleep as the muscles relax; snoring and apnea are subsequently reduced.  This procedure can be done under local anesthesia in the clinic with the patient awake.    Lower jaw advancement and Hyoid advancement    The hyoid bone is a small bone in the neck where the muscles of the tongue base and pharynx attach.  Patients with sleep apnea often have a large tongue base.  During the deep stages of sleep, normal muscle tone is relaxed, and the base of tongue falls back and can make contact with the back wall of the pharynx resulting in obstruction.  Through a very minimally invasive procedure, the hyoid bone is surgically repositioned anteriorly by placing a suture around it and suspending it to the front of the jaw bone.  Abnormality of the maxillofacial skeleton is a well-recognized risk factor of obstructive sleep apnea. Sleep apnea patients usually have small, narrow jaws that result in diminished airway dimension, which leads to nocturnal obstruction.  Maxillomandibular advancement achieves enlargement of the entire upper airway through expansion of the skeletal framework that encircle the airway.  The procedure consists of mobilizing the upper and lower jaw bones, and advancing then up to 10-39mm. The jaw bones are stabilized with titanium plates in the advanced position.  This procedure is technically very challenging as the bone cuts need to be precise, and the positioning of the teeth to match correctly after the advancement is critical. Patients have to have their teeth wired shut for several weeks while the bones heal.  While the surgery can be painful and require a several night hospital stay, the long term success rates are generally good.  Very few surgeons and medical centers perform this procedure frequently due to the increased surgical risks and potential  for complications.    Upper Airway Stimulation    What is it and how does it work?  An upper airway stimulation approach to sleep apnea works inside the patient?s body. It involves a small implantable pacemaker-like device which stabilizes the throat while sleeping by providing gentle stimulation to throat muscles and allowing the airway to remain open during sleep.        The device gets implanted along the right side of the neck and chest wall during a two or three hour long procedure under general anesthesia. After four weeks the device gets turned by the physician and patients use a small remote control to turn the device on at night and off in the morning when they wake up. The small sensor in the chest wall monitors the patient's breathing and sends mild stimulation to the hypoglossal nerve to prevent the tongue from blocking the airway.            How well does it work?    Several studies have reported on safety and efficacy of upper airway stimulation with good patient satisfaction scores, however this is an invasive surgical procedure and must be considered only for very selected patients who must meet several characteristics:    Be 11 years of age or older  Have moderate to severe OSA (AHI range from 15-65 with <25% central/ mixed apneas)  Be unable to use a Continuous positive airway pressure (CPAP) machine  Be free of complete concentric collapse at the palate, which is determined by a head a neck surgeon during a sleep endoscopy exam (DISE)  The therapy is not for people who are significantly overweight and it does NOT offer guarantee a cure or future improvement/protection from sleep apnea and does not guarantee that patients will not need to use CPAP in the future.     Summary    Sleep apnea surgery may be helpful for very selected patients. It takes a combination of procedures to achieve success.  A logical step-wise approach much be taken when a patient seeks surgery, and it is a requisite that the patient find a surgeon who understands both the pathophysiology of sleep apnea and the anatomy of the upper respiratory tract to ensure the best chance of success. Surgery has risks and does not protect people from needing CPAP therapy.        Sleep Hygiene    Helpful Hints to Help You Sleep    Poor sleep habits (referred to as hygiene) are among the most common problems encountered in our society. We stay up too late and get up too early. We interrupt our sleep with drugs, chemicals and work, and we overstimulate ourselves with late-night activities such as television.   Below are some essentials of good sleep habits. Many of these points will seem like common sense. But it is surprising how many of these important points are ignored by many of [place]. Click on any of the links below for more information:    Your Personal Habits     Fix a bedtime and an awakening time. Do not be one of those people who allows bedtime and awakening time to drift. The body ''gets used'' to falling asleep at a certain time, but only if this is relatively fixed. Even if you are retired or not working, this is an essential component of good sleeping habits.    Avoid napping during the day. If you nap throughout the day, it is no wonder that you will not  be able to sleep at night. The late afternoon for most people is a ''sleepy time.'' Many people will take a nap at that time. This is generally not a bad thing to do, provided you limit the nap to 30-45 minutes and can sleep well at night.    Avoid alcohol 4-6 hours before bedtime. Many people believe that alcohol helps them sleep. While alcohol has an immediate sleep-inducing effect, a few hours later as the alcohol levels in your blood start to fall, there is a stimulant or wake-up effect.    Avoid caffeine 4-6 hours before bedtime. This includes caffeinated beverages such as coffee, tea and many sodas, as well as chocolate, so be careful.    Avoid heavy, spicy, or sugary foods 4-6 hours before bedtime. These can affect your ability to stay asleep.    Exercise regularly, but not right before bed. Regular exercise, particularly in the afternoon, can help deepen sleep. Strenuous exercise within the 2 hours before bedtime, however, can decrease your ability to fall asleep.    Your Sleeping Environment    Use comfortable bedding. Uncomfortable bedding can prevent good sleep. Evaluate whether or not this is a source of your problem, and make appropriate changes.    Find a comfortable temperature setting for sleeping and keep the room well ventilated. If your bedroom is too cold or too hot, it can keep you awake. A cool (not cold) bedroom is often the most conducive to sleep.    Block out all distracting noise, and eliminate as much light as possible.    Reserve the bed for sleep and sex. Don't use the bed as an office, workroom or recreation room. Let your body ''know'' that the bed is associated with sleeping.    Getting Ready For Bed    Try a light snack before bed. Warm milk and foods high in the amino acid tryptophan, such as bananas, may help you to sleep.    Practice relaxation techniques before bed. Relaxation techniques such as yoga, deep breathing and others may help relieve anxiety and reduce muscle tension.     Don't take your worries to bed. Leave your worries about job, school, daily life, etc., behind when you go to bed. Some people find it useful to assign a ''worry period'' during the evening or late afternoon to deal with these issues.    Establish a pre-sleep ritual. Pre-sleep rituals, such as a warm bath or a few minutes of reading, can help you sleep.    Get into your favorite sleeping position. If you don't fall asleep within 15-30 minutes, get up, go into another room, and read until sleepy.    Getting Up in the Middle of the Night    Most people wake up one or two times a night for various reasons. If you find that you get up in the middle of night and cannot get back to sleep within 15-20 minutes, then do not remain in the bed ''trying hard'' to sleep. Get out of bed. Leave the bedroom. Read, have a light snack, do some quiet activity, or take a bath. You will generally find that you can get back to sleep 20 minutes or so later. Do not perform challenging or engaging activity such as office work, housework, Catering manager. Do not watch television.    A Word About Television    Many people fall asleep with the television on in their room. Watching television before bedtime is often a bad idea. Television is a  very engaging medium that tends to keep people up. We generally recommend that the television not be in the bedroom. At the appropriate bedtime, the TV should be turned off and the patient should go to bed. Some people find that the radio helps them go to sleep. Since radio is a less engaging medium than TV, this is probably a good idea.    Other Factors    Several physical factors are known to upset sleep. These include arthritis, acid reflux with heartburn, menstruation, headaches and hot flashes.     Psychological and mental health problems like depression, anxiety and stress are often associated with sleeping difficulty. In many cases, difficulty staying asleep may be the only presenting sign of depression. A physician should be consulted about these issues to help determine the problem and the best treatment.    Many medications can cause sleeplessness as a side effect. Ask your doctor or pharmacist if medications you are taking can lead to sleeplessness.    To help overall improvement in sleep patterns, your doctor may prescribe sleep medications for short-term relief of a sleep problem. The decision to take sleeping aids is a medical one to be made in the context of your overall health picture.    Always follow the advice of your physician and other       Sleep Hygiene 101: How to Improve Your Sleep Patterns    Every New Year the sleep-related resolutions being made seem to disappear within a few weeks. Most of the time we make resolutions without understanding how our habits affect our sleep.  I ask my patients to follow proper Sleep Hygiene in order to get better sleep, and when they do, their success in making and keeping those resolutions is much greater. Some of the basic Sleep Hygiene recommendations include:  TV or not TV, that is the question. Many of my patients sheepishly tell me that they sleep with the TV on, well, so does my wife! If it helps then I am fine with it, but put on a TV timer so it does not keep anyone up the rest of the night.  Wean the Bean. Drink higher caffeinated beverages in the morning and move to lower ones in the mid afternoon. Try to move to fruit juices and water by 2-3 pm. While many of my patients tell me ''I can drink an espresso and go to bed'' they may be correct but the sleep they are getting is more jittery than they are!  A Glass for a Glass. If you are drinking alcohol remember that while it may make you fall asleep quickly, it prevents you from reaching deep sleep. Drink one glass of water for every glass of alcohol. Not only will it slow down your drinking but it will prevent you from getting dehydrated which is why you get a hangover!  Watch out for the Double Whammy. If you are going out with friends and have a few drinks try to get to bed on time! Most people do both, they stay out late and they drink too much-no wonder they feel horrible in the am.  Timing is everything. Some people get relaxed from exercise while others get energized, which one are you? If running relaxes you then do it about 4 hours before bed.    SLEEP 101:     Sleep is actually a combination of 2 systems, your sleep drive (like hunger) and a biological clock that tells you when to sleep. When both  are working well together, you sleep best. This is why a regular sleep schedule is so important. Interestingly enough, with all the sleep research out there, we still do not understand why we sleep. But we sure know what happens when we don't: disaster. And when we do get great sleep, really good things happen (increased immune system, look better, weight loss, and increased performance).    Here are the five questions (and answers with tips!) to ask yourself to help improve your sleep:    1. How can my sleep be better?    Answer: There are two main aspects to sleep that most of [place] want to improve: the quantity (the number of minutes of sleep) and the quality (how refreshing is the sleep that you get).   The easiest way to increase your quantity of sleep is to give yourself a bedtime. When was the last time you had a bedtime? When you were [age], and someone else made you go to bed?? Many of my patients tell me that they get involved with something (TV, the internet, a good book) and then ''don't know where the time went, '' look up and it is 1 am.   Here is a quick trick: Set your alarm clock for 8 hours before you are supposed to wake up (waking at 7am? Set the alarm for 11pm). This forces you into your room to turn it off and should remind you that it is time for bed. Try to get in bed within the next 30 minutes.  The easiest way to increase the quality of your sleep is to reduce caffeine. Caffeine can have a half-life (the time it stays effective in your system) of between 8 and 10 hours! I am not saying to give it up (you wouldn't listen anyway), but try a method called caffeine fading: have your highest caffeinated beverage in the morning and then slowly move to less caffeinated beverages throughout the day. By 2:30 p.m. try to be caffeine free.    2. How can I fall asleep more quickly?    Answer: There are many things that prevent people from falling asleep but two that are quite common include: your body is not ready (sleep is not just an on/off switch it needs time to unwind) and your mind is racing (when you get in bed this may be the first opportunity you've have all day to think or focus on things that matter to you!) To help fall asleep more quickly:   The easiest way to get your body ready for sleep is a HOT bath about an hour before bed. Make it a bubble bath with lavender aromatherapy (the bubbles keep the water hotter longer and lavender helps with relaxation and sleep.  The easiest way to prevent your mind from racing is to distract yourself. My easy quick trick here is to count backwards from 300 by 3's. It is complicated enough that you cannot think of anything else, and it is so boring, you are out like a light.    3. Am I doing something to keep myself awake in the middle of the night?  Answer: There are several things that can wake [place] up and keep [place] up at night that are both biological and psychological.   One of the easiest things to help you go back to sleep, biologically: Do not turn on the lights! Lights makes your brain think that is it morning, and makes it hard to return to sleep. Put  a night light in your bedroom, hallway or bathroom. That way you can get to where you are going, without telling your brain it is morning.  One of the easiest things to help you go back to sleep, psychologically: Do not look at the clock. Who really cares how much time you have left to sleep, just go back to sleep. Watching the clock only causes added anxiety about the next day: something we do not need at night. Turn it around and do not look!    4. Could my bedroom be affecting my sleep?  Answer: YES! It is critical to have an environment that is conducive to better sleep.   One of the easiest ways to have a healthy sleep environment: Get the right equipment. Just like exercise, the right equipment will help performance. A great start for the New Year is a new pillow. If you have not bought one in the last year, it is time!  Another way to better your sleep environment: Set the mood. When you walk in to your bedroom, how does it make you feel? Relaxed? Tense?! What could you add, or take away that might help your bedroom be more tranquil. Dim the lights one hour before bed, and never bring your laptop into your bedroom.    5. What can I do during the day to set myself up for good sleep at night?  Answer: There are many different activities you do during the day that will affect your sleep at night. The top two: what you consume and your activity level.   You are what you eat and what you eat affects how you sleep. Try to limit meals to three hours before lights out, but do not go to bed hungry. If you must eat late make it a lighter meal, with simple carbohydrates.  Get Moving. Exercise (specifically 20-30 minutes of aerobic) is one of the single best ways to improve the quality of your sleep. Research shows that those who have a regular exercise program get deeper sleep. Try not to exercise less than 2 hours before bed.  So there you have it. My five questions to ask yourself and the quick fixes to help you out with your Sleep Hygiene as you look at your sleep and take the Sleep Challenge this year! Remember, everything you do, you do better with a good night's rest.

## 2021-11-01 NOTE — Progress Notes
I saw and evaluated  Maurice Ramirez.  I discussed the case with the fellow and agree with the findings and plan of care as documented in the resident's note.        R. 

## 2021-11-06 ENCOUNTER — Telehealth: Payer: BLUE CROSS/BLUE SHIELD

## 2021-11-06 NOTE — Telephone Encounter
Call Back Request      Reason for call back: Per patient assistant Robyn  Was informed needed to register CPAP machine, CPAP machine is registered would like to know what would be the next step to take.  Registration #: [id number]  Confirmation #: [id number]     CB#: [phone]    Any Symptoms:  []  Yes  [x]  No       If yes, what symptoms are you experiencing:    o Duration of symptoms (how long):    o Have you taken medication for symptoms (OTC or Rx):      If call was taken outside of clinic hours:    [] Patient or caller has been notified that this message was sent outside of normal clinic hours.     [] Patient or caller has been warm transferred to the physician's answering service. If applicable, patient or caller informed to please call back if symptoms progress.  Patient or caller has been notified of the turnaround time of 1-2 business day(s).

## 2021-11-19 ENCOUNTER — Ambulatory Visit (INDEPENDENT_AMBULATORY_CARE_PROVIDER_SITE_OTHER): Payer: Medicare Other | Admitting: Gastroenterology

## 2021-11-21 DIAGNOSIS — Z20822 Contact with and (suspected) exposure to covid-19: Secondary | ICD-10-CM | POA: Diagnosis not present

## 2021-11-22 ENCOUNTER — Ambulatory Visit: Payer: BLUE CROSS/BLUE SHIELD

## 2021-11-29 DIAGNOSIS — Z20822 Contact with and (suspected) exposure to covid-19: Secondary | ICD-10-CM | POA: Diagnosis not present

## 2021-12-10 DIAGNOSIS — Z20822 Contact with and (suspected) exposure to covid-19: Secondary | ICD-10-CM | POA: Diagnosis not present

## 2021-12-27 ENCOUNTER — Other Ambulatory Visit: Payer: Self-pay | Admitting: Urology

## 2021-12-27 ENCOUNTER — Other Ambulatory Visit (HOSPITAL_COMMUNITY): Payer: Self-pay | Admitting: Urology

## 2022-01-08 ENCOUNTER — Telehealth: Payer: BLUE CROSS/BLUE SHIELD

## 2022-01-08 NOTE — Telephone Encounter
Call Back Request      Reason for call back: patients assistant robyn states pt tested covid positive. he has chills, stuffy nose, fatigue. He needs the paxlovid, she states to please send to [facility] on file today. She would appreciate a call once it has been done.  Symptom: Chills - Adult  Outcome: Schedule Appointment  Reason: No high acuity concerns reported by caller    Any Symptoms:  [x]  Yes  []  No       If yes, what symptoms are you experiencing:    o Duration of symptoms (how long): late yesterday  o Have you taken medication for symptoms (OTC or Rx):  is unsure    If call was taken outside of clinic hours:    [] Patient or caller has been notified that this message was sent outside of normal clinic hours.     [] Patient or caller has been warm transferred to the physician's answering service. If applicable, patient or caller informed to please call back if symptoms progress.  Patient or caller has been notified of the turnaround time of 1-2 business day(s).

## 2022-01-08 NOTE — Telephone Encounter
Called patient's assistant, left detailed VM advising that Dr. Evelena Leyden is currently out of the office due to a family emergency.    Advised that at this time, patient should seek an appt at urgent care or ER for a STAT evaluation and prescription for paxlovid.    (Provided phone number for [facility] and advised that they can call to schedule an urgent video visit with a provider for an eval and rx).

## 2022-01-09 ENCOUNTER — Other Ambulatory Visit: Payer: Self-pay

## 2022-01-09 ENCOUNTER — Encounter (HOSPITAL_COMMUNITY)
Admission: RE | Admit: 2022-01-09 | Discharge: 2022-01-09 | Disposition: A | Discharge status: Home / Self Care | Payer: Medicare Other | Source: Ambulatory Visit | Attending: Urology | Admitting: Urology

## 2022-01-09 ENCOUNTER — Ambulatory Visit (HOSPITAL_COMMUNITY)
Admission: RE | Admit: 2022-01-09 | Discharge: 2022-01-09 | Disposition: A | Discharge status: Home / Self Care | Payer: Medicare Other | Source: Ambulatory Visit | Attending: Urology | Admitting: Urology

## 2022-01-09 ENCOUNTER — Encounter (HOSPITAL_COMMUNITY): Payer: Self-pay

## 2022-01-09 DIAGNOSIS — C61 Malignant neoplasm of prostate: Secondary | ICD-10-CM | POA: Insufficient documentation

## 2022-01-09 DIAGNOSIS — C775 Secondary and unspecified malignant neoplasm of intrapelvic lymph nodes: Secondary | ICD-10-CM | POA: Diagnosis not present

## 2022-01-09 DIAGNOSIS — Z981 Arthrodesis status: Secondary | ICD-10-CM | POA: Diagnosis not present

## 2022-01-09 DIAGNOSIS — M160 Bilateral primary osteoarthritis of hip: Secondary | ICD-10-CM | POA: Diagnosis not present

## 2022-01-09 DIAGNOSIS — Z8546 Personal history of malignant neoplasm of prostate: Secondary | ICD-10-CM | POA: Diagnosis not present

## 2022-01-09 DIAGNOSIS — K5730 Diverticulosis of large intestine without perforation or abscess without bleeding: Secondary | ICD-10-CM | POA: Diagnosis not present

## 2022-01-09 DIAGNOSIS — N200 Calculus of kidney: Secondary | ICD-10-CM | POA: Diagnosis not present

## 2022-01-09 LAB — POCT I-STAT CREATININE: Creatinine, Ser: 1.7 mg/dL — ABNORMAL HIGH (ref 0.61–1.24)

## 2022-01-09 MED ORDER — TECHNETIUM TC 99M MEDRONATE IV KIT
20.0000 | PACK | Freq: Once | INTRAVENOUS | Status: AC | PRN
  Administered 2022-01-09: 19.5 via INTRAVENOUS

## 2022-01-09 MED ORDER — IOHEXOL 300 MG/ML  SOLN
100.0000 mL | Freq: Once | INTRAMUSCULAR | Status: AC | PRN
  Administered 2022-01-09: 80 mL via INTRAVENOUS

## 2022-01-10 DIAGNOSIS — C61 Malignant neoplasm of prostate: Secondary | ICD-10-CM | POA: Diagnosis not present

## 2022-01-17 DIAGNOSIS — N281 Cyst of kidney, acquired: Secondary | ICD-10-CM | POA: Diagnosis not present

## 2022-01-17 DIAGNOSIS — C61 Malignant neoplasm of prostate: Secondary | ICD-10-CM | POA: Diagnosis not present

## 2022-01-17 DIAGNOSIS — N393 Stress incontinence (female) (male): Secondary | ICD-10-CM | POA: Diagnosis not present

## 2022-01-17 DIAGNOSIS — C775 Secondary and unspecified malignant neoplasm of intrapelvic lymph nodes: Secondary | ICD-10-CM | POA: Diagnosis not present

## 2022-01-23 DIAGNOSIS — Z20822 Contact with and (suspected) exposure to covid-19: Secondary | ICD-10-CM | POA: Diagnosis not present

## 2022-01-28 DIAGNOSIS — Z20822 Contact with and (suspected) exposure to covid-19: Secondary | ICD-10-CM | POA: Diagnosis not present

## 2022-02-18 DIAGNOSIS — Z20822 Contact with and (suspected) exposure to covid-19: Secondary | ICD-10-CM | POA: Diagnosis not present

## 2022-03-26 MED ORDER — ADVAIR DISKUS 500-50 MCG/ACT IN AEPB
11 refills | Status: AC
Start: 2022-03-26 — End: ?

## 2022-05-01 DIAGNOSIS — N281 Cyst of kidney, acquired: Secondary | ICD-10-CM | POA: Diagnosis not present

## 2022-05-01 DIAGNOSIS — C775 Secondary and unspecified malignant neoplasm of intrapelvic lymph nodes: Secondary | ICD-10-CM | POA: Diagnosis not present

## 2022-05-01 DIAGNOSIS — C61 Malignant neoplasm of prostate: Secondary | ICD-10-CM | POA: Diagnosis not present

## 2022-05-01 DIAGNOSIS — N393 Stress incontinence (female) (male): Secondary | ICD-10-CM | POA: Diagnosis not present

## 2022-05-02 ENCOUNTER — Ambulatory Visit: Payer: BLUE CROSS/BLUE SHIELD

## 2022-05-02 DIAGNOSIS — J449 Chronic obstructive pulmonary disease, unspecified: Secondary | ICD-10-CM

## 2022-05-02 NOTE — Progress Notes
PULMONARY/SLEEP MEDICINE NOTE    PCP: Babs Sciara, MD      Chief Complaint   Patient presents with   ? Follow-up     REFERRING PRACTITIONER: Babs Sciara, MD  PRIMARY CARE PROVIDER: Babs Sciara, MD  CHIEF COMPLAINT: OSA and asthma follow up    History of Presenting Illness:  Maurice Ramirez?is a 71 y.o.?male?with  moderate persistent asthma, intermittent episodes of allergic bronchopulmonary aspergillosis , h/o LLL nodule (bx in 2012 benign), OSA on PAP, and h/o afib s/p cardioversion.     Doing well. 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 Does the [employer], bands, dumbbells - 1.5-2 hours work out then walks and [employer]. Gets enough sleep. Stopped alcohol.      He still reports using his device every night, still wakes up with the mask off and does not recall why, but this is not every night, and on the whole, he denies fatigue, daytime sleepiness, snoring and morning headaches. Uses [employer] when he travels.  No asthma exacerbations during this time. Denies shortness of breath, wheezing, chest tightness and dizziness. Compliant on his inhalers. Able to run/jog every few days. Does Bruce yearly and does better every year.    Asthma meds and hx:  Singulair 10mg  daily, advair 500/50 BID and albuterol (1x/week)  Last hospital visit for asthma was many years ago, intubated about >59yrs ago for a severe attack  Last use of prednisone dose for asthma (prior to sinus infections) was several years ago      COMPLIANCE DATA: No new data today   Received new Respironics device and not connected to care orchestrator at this point. States he has good feedback from the device.       Medications that the patient states to be currently taking   Medication Sig   ? ADVAIR DISKUS 500-50 MCG/ACT diskus inhaler USE 1 INHALATION TWICE A DAY   ? EPINEPHrine 0.3 mg/0.3 mL auto-injector Inject 0.3 mLs (0.3 mg total) into the muscle as needed for for Anaphylaxis.   ? MONTELUKAST 10 mg tablet TAKE 1 TABLET EVERY EVENING   ? PROAIR HFA 108 (90 Base) MCG/ACT inhaler    ? rosuvastatin 10 mg tablet Take 1 tablet (10 mg total) by mouth every morning.         PHYSICAL EXAM:  BP 105/52  ~ Pulse 57  ~ Temp 36.2 ?C (97.1 ?F) (Forehead)  ~ Resp 17  ~ Ht 5' 9'' (1.753 m)  ~ Wt 167 lb (75.8 kg)  ~ SpO2 97%  ~ BMI 24.66 kg/m?   General: Well-developed male, in no acute distress.   HEENT: Normocephalic and atraumatic  Pulm: CTAB  Neuro: Alert, oriented and appropriately interactive. No focal deficits. Speech fluent.   Psychological: normal affect    Labs with outside doctor - states are normal.    LABS:  Lab Results   Component Value Date    HGB 15.2 07/14/2018     No results found for: ''FERRITIN''  Lab Results   Component Value Date    TSH 2.2 06/04/2011       IgE 1666 07/2018 (<-1422)  Imaging:  CT chest 2012  Unchanged biapical fibronodular pleural parenchymal scarring and calcified subcentimeter nodule within the right middle lobe (2-51), consistent with prior granulomatous disease.?  Persistent diffuse peribronchial and peribronchiolar thickening with associated airway irregularity and occasional ectasia, consistent with chronic asthma/bronchitis, with scattered bronchocentric bands and architectural distortion within both upper, right middle and left  greater than right lower lobes, consistent with postinflammatory pleural parenchymal scarring.  Near-complete resolution of mass-like consolidation within the left lower lobe with residual architectural distortion (2-56). Several fluctuating subcentimeter nodules within the left lower lobe and resolved within the right lower lobe since 05/28/2011, likely airway-related inflammatory and mucus impaction.  ?  Pathology:  LLL nodule 05/2011  LUNG NODULE, LEFT LOWER LOBE (BIOPSY):  - Acute and organizing pneumonia (see Comment)  - Prominent chronic inflammation in interstitium  - Gram stain is negative for bacterial organisms  - GMS stain is negative for fungal organisms  - AFB stain is negative for acid-fast bacilli  - No neoplasms identified    PFT 12/2019  FVC  (preBD) 3.60/90% (postBD) 4.26/108%   FEV1 (preBD 1.22/40% (postBD) 1.60/52%  Has positive BD response   ?    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 Has been doing well with no issues. No compliance data recently, patient is due for a new device, so can assess data from new device.        Obstructive Sleep Apnea:  Continue with PAP therapy, the patient has acclimated well and is experiencing symptomatic improvement.  The patient is willing to continue with PAP therapy.  Recommended patient to register his Dreamstation with [employer]  New prescription for APAP provided (given machine is >14 years old)    Asthma  Well controlled  No change in therapy  Has dose of z pack, prednisone and epi pen in case of asthma exacerbation (will refill)  Repeat PFTs with next appointment      Follow up: ~6 months, sooner if needed.    Discussed with attending, Dr. Deanne Coffer.    Lazaro Arms, MD  Pulmonary, Critical Care, and Sleep Medicine Fellow  05/02/2022 at 9:43 AM

## 2022-05-09 DIAGNOSIS — N5201 Erectile dysfunction due to arterial insufficiency: Secondary | ICD-10-CM | POA: Diagnosis not present

## 2022-05-09 DIAGNOSIS — H34811 Central retinal vein occlusion, right eye, with macular edema: Secondary | ICD-10-CM | POA: Diagnosis not present

## 2022-05-09 DIAGNOSIS — C775 Secondary and unspecified malignant neoplasm of intrapelvic lymph nodes: Secondary | ICD-10-CM | POA: Diagnosis not present

## 2022-05-09 DIAGNOSIS — N393 Stress incontinence (female) (male): Secondary | ICD-10-CM | POA: Diagnosis not present

## 2022-05-09 DIAGNOSIS — C61 Malignant neoplasm of prostate: Secondary | ICD-10-CM | POA: Diagnosis not present

## 2022-05-09 DIAGNOSIS — N281 Cyst of kidney, acquired: Secondary | ICD-10-CM | POA: Diagnosis not present

## 2022-05-14 ENCOUNTER — Observation Stay (HOSPITAL_COMMUNITY): Payer: Medicare Other

## 2022-05-14 ENCOUNTER — Observation Stay (HOSPITAL_COMMUNITY)
Admission: EM | Admit: 2022-05-14 | Discharge: 2022-05-15 | Disposition: A | Discharge status: Home / Self Care | Payer: Medicare Other | Attending: Internal Medicine | Admitting: Internal Medicine

## 2022-05-14 ENCOUNTER — Emergency Department (HOSPITAL_COMMUNITY): Payer: Medicare Other

## 2022-05-14 ENCOUNTER — Encounter (HOSPITAL_COMMUNITY): Payer: Self-pay

## 2022-05-14 ENCOUNTER — Other Ambulatory Visit: Payer: Self-pay

## 2022-05-14 DIAGNOSIS — J449 Chronic obstructive pulmonary disease, unspecified: Secondary | ICD-10-CM | POA: Diagnosis not present

## 2022-05-14 DIAGNOSIS — I672 Cerebral atherosclerosis: Secondary | ICD-10-CM | POA: Diagnosis not present

## 2022-05-14 DIAGNOSIS — Z7982 Long term (current) use of aspirin: Secondary | ICD-10-CM | POA: Insufficient documentation

## 2022-05-14 DIAGNOSIS — N182 Chronic kidney disease, stage 2 (mild): Secondary | ICD-10-CM | POA: Insufficient documentation

## 2022-05-14 DIAGNOSIS — H3411 Central retinal artery occlusion, right eye: Secondary | ICD-10-CM | POA: Diagnosis not present

## 2022-05-14 DIAGNOSIS — Z79899 Other long term (current) drug therapy: Secondary | ICD-10-CM | POA: Diagnosis not present

## 2022-05-14 DIAGNOSIS — F17210 Nicotine dependence, cigarettes, uncomplicated: Secondary | ICD-10-CM | POA: Insufficient documentation

## 2022-05-14 DIAGNOSIS — H5340 Unspecified visual field defects: Secondary | ICD-10-CM | POA: Diagnosis not present

## 2022-05-14 DIAGNOSIS — Z8546 Personal history of malignant neoplasm of prostate: Secondary | ICD-10-CM | POA: Insufficient documentation

## 2022-05-14 DIAGNOSIS — H34231 Retinal artery branch occlusion, right eye: Secondary | ICD-10-CM

## 2022-05-14 DIAGNOSIS — I639 Cerebral infarction, unspecified: Secondary | ICD-10-CM | POA: Diagnosis not present

## 2022-05-14 DIAGNOSIS — H43823 Vitreomacular adhesion, bilateral: Secondary | ICD-10-CM | POA: Diagnosis not present

## 2022-05-14 DIAGNOSIS — H5461 Unqualified visual loss, right eye, normal vision left eye: Secondary | ICD-10-CM | POA: Insufficient documentation

## 2022-05-14 DIAGNOSIS — I129 Hypertensive chronic kidney disease with stage 1 through stage 4 chronic kidney disease, or unspecified chronic kidney disease: Secondary | ICD-10-CM | POA: Diagnosis not present

## 2022-05-14 DIAGNOSIS — I6523 Occlusion and stenosis of bilateral carotid arteries: Secondary | ICD-10-CM | POA: Diagnosis not present

## 2022-05-14 DIAGNOSIS — R29818 Other symptoms and signs involving the nervous system: Secondary | ICD-10-CM | POA: Diagnosis not present

## 2022-05-14 DIAGNOSIS — I10 Essential (primary) hypertension: Secondary | ICD-10-CM | POA: Diagnosis not present

## 2022-05-14 DIAGNOSIS — H538 Other visual disturbances: Secondary | ICD-10-CM | POA: Diagnosis not present

## 2022-05-14 DIAGNOSIS — H35033 Hypertensive retinopathy, bilateral: Secondary | ICD-10-CM | POA: Diagnosis not present

## 2022-05-14 LAB — CBC WITH DIFFERENTIAL/PLATELET
Abs Immature Granulocytes: 0.04 10*3/uL (ref 0.00–0.07)
Basophils Absolute: 0.1 10*3/uL (ref 0.0–0.1)
Basophils Relative: 1 %
Eosinophils Absolute: 0.3 10*3/uL (ref 0.0–0.5)
Eosinophils Relative: 3 %
HCT: 47.9 % (ref 39.0–52.0)
Hemoglobin: 14.3 g/dL (ref 13.0–17.0)
Immature Granulocytes: 0 %
Lymphocytes Relative: 18 %
Lymphs Abs: 1.9 10*3/uL (ref 0.7–4.0)
MCH: 25.9 pg — ABNORMAL LOW (ref 26.0–34.0)
MCHC: 29.9 g/dL — ABNORMAL LOW (ref 30.0–36.0)
MCV: 86.8 fL (ref 80.0–100.0)
Monocytes Absolute: 0.7 10*3/uL (ref 0.1–1.0)
Monocytes Relative: 6 %
Neutro Abs: 7.9 10*3/uL — ABNORMAL HIGH (ref 1.7–7.7)
Neutrophils Relative %: 72 %
Platelets: 301 10*3/uL (ref 150–400)
RBC: 5.52 MIL/uL (ref 4.22–5.81)
RDW: 14.9 % (ref 11.5–15.5)
WBC: 10.9 10*3/uL — ABNORMAL HIGH (ref 4.0–10.5)
nRBC: 0 % (ref 0.0–0.2)

## 2022-05-14 LAB — PROTIME-INR
INR: 1 (ref 0.8–1.2)
Prothrombin Time: 13.4 seconds (ref 11.4–15.2)

## 2022-05-14 LAB — BASIC METABOLIC PANEL
Anion gap: 10 (ref 5–15)
BUN: 20 mg/dL (ref 8–23)
CO2: 27 mmol/L (ref 22–32)
Calcium: 9.1 mg/dL (ref 8.9–10.3)
Chloride: 99 mmol/L (ref 98–111)
Creatinine, Ser: 1.46 mg/dL — ABNORMAL HIGH (ref 0.61–1.24)
GFR, Estimated: 51 mL/min — ABNORMAL LOW (ref >60)
Glucose, Bld: 120 mg/dL — ABNORMAL HIGH (ref 70–99)
Potassium: 4.3 mmol/L (ref 3.5–5.1)
Sodium: 136 mmol/L (ref 135–145)

## 2022-05-14 LAB — HEMOGLOBIN A1C
Hgb A1c MFr Bld: 7.7 % — ABNORMAL HIGH (ref 4.8–5.6)
Mean Plasma Glucose: 174.29 mg/dL

## 2022-05-14 LAB — APTT: aPTT: 32 seconds (ref 24–36)

## 2022-05-14 MED ORDER — ASPIRIN 325 MG PO TBEC
325.0000 mg | DELAYED_RELEASE_TABLET | Freq: Once | ORAL | Status: AC
Start: 2022-05-14 — End: 2022-05-14
  Administered 2022-05-14: 325 mg via ORAL
  Filled 2022-05-14: qty 1

## 2022-05-14 MED ORDER — LORAZEPAM 0.5 MG PO TABS: 0.5000 mg | ORAL_TABLET | Freq: Four times a day (QID) | ORAL | Status: DC | PRN

## 2022-05-14 MED ORDER — IRBESARTAN 300 MG PO TABS
150.0000 mg | ORAL_TABLET | Freq: Every day | ORAL | Status: DC
  Administered 2022-05-15: 150 mg via ORAL
  Filled 2022-05-14: qty 1

## 2022-05-14 MED ORDER — ACETAMINOPHEN 160 MG/5ML PO SOLN: 650.0000 mg | ORAL | Status: DC | PRN

## 2022-05-14 MED ORDER — IOHEXOL 350 MG/ML SOLN
75.0000 mL | Freq: Once | INTRAVENOUS | Status: AC | PRN
  Administered 2022-05-14: 75 mL via INTRAVENOUS

## 2022-05-14 MED ORDER — ENOXAPARIN SODIUM 40 MG/0.4ML IJ SOSY
40.0000 mg | PREFILLED_SYRINGE | INTRAMUSCULAR | Status: DC
Start: 2022-05-14 — End: 2022-05-16
  Administered 2022-05-14: 40 mg via SUBCUTANEOUS
  Filled 2022-05-14: qty 0.4

## 2022-05-14 MED ORDER — MELOXICAM 7.5 MG PO TABS
15.0000 mg | ORAL_TABLET | Freq: Every day | ORAL | Status: DC
  Administered 2022-05-15: 15 mg via ORAL
  Filled 2022-05-14: qty 2

## 2022-05-14 MED ORDER — HYDROCHLOROTHIAZIDE 25 MG PO TABS
25.0000 mg | ORAL_TABLET | Freq: Every day | ORAL | Status: DC
  Administered 2022-05-15: 25 mg via ORAL
  Filled 2022-05-14: qty 1

## 2022-05-14 MED ORDER — ASPIRIN 325 MG PO TBEC: 325.0000 mg | DELAYED_RELEASE_TABLET | Freq: Every day | ORAL | Status: DC

## 2022-05-14 MED ORDER — SODIUM CHLORIDE 0.9 % IV SOLN: INTRAVENOUS | Status: DC

## 2022-05-14 MED ORDER — STROKE: EARLY STAGES OF RECOVERY BOOK
Freq: Once | Status: AC
  Filled 2022-05-14: qty 1

## 2022-05-14 MED ORDER — ACETAMINOPHEN 650 MG RE SUPP: 650.0000 mg | RECTAL | Status: DC | PRN

## 2022-05-14 MED ORDER — NICOTINE 21 MG/24HR TD PT24
21.0000 mg | MEDICATED_PATCH | Freq: Every day | TRANSDERMAL | Status: DC
  Administered 2022-05-15: 21 mg via TRANSDERMAL
  Filled 2022-05-14: qty 1

## 2022-05-14 MED ORDER — OXYCODONE HCL 5 MG/5ML PO SOLN: 5.0000 mg | Freq: Four times a day (QID) | ORAL | Status: DC | PRN

## 2022-05-14 MED ORDER — AMLODIPINE BESYLATE 10 MG PO TABS
10.0000 mg | ORAL_TABLET | Freq: Every day | ORAL | Status: DC
  Administered 2022-05-15: 10 mg via ORAL
  Filled 2022-05-14: qty 1

## 2022-05-14 MED ORDER — LORATADINE 10 MG PO TABS: 10.0000 mg | ORAL_TABLET | Freq: Every day | ORAL | Status: DC

## 2022-05-14 MED ORDER — ASPIRIN 81 MG PO TBEC
81.0000 mg | DELAYED_RELEASE_TABLET | Freq: Every day | ORAL | Status: DC
Start: 2022-05-15 — End: 2022-05-16
  Administered 2022-05-15: 81 mg via ORAL
  Filled 2022-05-14: qty 1

## 2022-05-14 MED ORDER — ACETAMINOPHEN 325 MG PO TABS: 650.0000 mg | ORAL_TABLET | ORAL | Status: DC | PRN

## 2022-05-14 NOTE — ED Notes (Signed)
Pt transported to MRI 

## 2022-05-14 NOTE — ED Triage Notes (Signed)
Pt arrived POV from the eye doctor c/o right blurred eye vision x6 days. Pt states his eye doctor told him he had a stroke and his vision would not correct itself.

## 2022-05-14 NOTE — ED Notes (Signed)
Patient back from mri, placed in rom. Connected back to cardiac monitor

## 2022-05-14 NOTE — ED Provider Notes (Signed)
[facility] EMERGENCY DEPARTMENT Provider Note   CSN: [id number] Arrival date & time: 05/14/22  1215     History  Chief Complaint  Patient presents with   Blurred Vision    Mark Cannon is a 71 y.o. male.  Patient sent here by ophthalmologist for stroke in his eye.  He had right eye blurred vision for the last 6 days.  States that he could not see in the center of his visual field.  No other weakness or numbness or facial droop.  No trauma history.  He was seen by retinal specialist today and was told to come for stroke work-up.  He has a history of high blood pressure.  No other significant medical history.  Does not take blood thinners.  The history is provided by the patient.       Home Medications Prior to Admission medications   Medication Sig Start Date End Date Taking? Authorizing Provider  amLODipine (NORVASC) 10 MG tablet Take 10 mg by mouth daily.    [provider]  cetirizine (ZYRTEC) 10 MG tablet Take 10 mg by mouth daily.    [provider]  hydrochlorothiazide (HYDRODIURIL) 25 MG tablet Take 25 mg by mouth daily.    [provider]  meloxicam (MOBIC) 15 MG tablet Take 15 mg by mouth daily. 01/27/18   [provider]  olmesartan (BENICAR) 20 MG tablet Take 20 mg by mouth daily.    [provider]  OXYCODONE HCL PO Take by mouth. Takes about one half a week prn    [provider]      Allergies    Lidocaine, Benzocaine, and Other    Review of Systems   Review of Systems  Physical Exam Updated Vital Signs  ED Triage Vitals  Enc Vitals Group     BP 05/14/22 1226 (!) 124/91     Pulse Rate 05/14/22 1226 84     Resp 05/14/22 1226 18     Temp 05/14/22 1226 98.2 F (36.8 C)     Temp Source 05/14/22 1226 Oral     SpO2 05/14/22 1226 92 %     Weight 05/14/22 1233 232 lb (105.2 kg)     Height 05/14/22 1233 '5\' 8"'$  (1.727 m)     Head Circumference --      Peak Flow --      Pain Score  05/14/22 1233 0     Pain Loc --      Pain Edu? --      Excl. in [place]? --     Physical Exam Vitals and nursing note reviewed.  Constitutional:      General: He is not in acute distress.    Appearance: He is well-developed.  HENT:     Head: Normocephalic and atraumatic.  Eyes:     Conjunctiva/sclera: Conjunctivae normal.  Cardiovascular:     Rate and Rhythm: Normal rate and regular rhythm.     Heart sounds: No murmur heard. Pulmonary:     Effort: Pulmonary effort is normal. No respiratory distress.     Breath sounds: Normal breath sounds.  Abdominal:     Palpations: Abdomen is soft.     Tenderness: There is no abdominal tenderness.  Musculoskeletal:        General: No swelling.     Cervical back: Neck supple.  Skin:    General: Skin is warm and dry.     Capillary Refill: Capillary refill takes less than 2 seconds.  Neurological:     General: No focal deficit present.     Mental Status: He is alert and oriented to person, place, and time.     Cranial Nerves: No cranial nerve deficit.     Sensory: No sensory deficit.     Motor: No weakness.     Coordination: Coordination normal.     Comments: 5+ out of 5 strength throughout, normal sensation, no drift, normal finger-nose-finger, central visual field loss in the right eye but otherwise visual fields intact bilaterally  Psychiatric:        Mood and Affect: Mood normal.     ED Results / Procedures / Treatments   Labs (all labs ordered are listed, but only abnormal results are displayed) Labs Reviewed  CBC WITH DIFFERENTIAL/PLATELET - Abnormal; Notable for the following components:      Result Value   WBC 10.9 (*)    MCH 25.9 (*)    MCHC 29.9 (*)    Neutro Abs 7.9 (*)    All other components within normal limits  BASIC METABOLIC PANEL - Abnormal; Notable for the following components:   Glucose, Bld 120 (*)    Creatinine, Ser 1.46 (*)    GFR, Estimated 51 (*)    All other components within normal limits  PROTIME-INR   APTT    EKG EKG Interpretation  Date/Time:  Tuesday May 14 2022 12:39:10 EDT Ventricular Rate:  84 PR Interval:    QRS Duration: 76 QT Interval:  360 QTC Calculation: 425 R Axis:   76 Text Interpretation: Accelerated Junctional rhythm Low voltage QRS Cannot rule out Anterior infarct , age undetermined Abnormal ECG When compared with ECG of 25-Mar-2019 21:00, PREVIOUS ECG IS PRESENT Confirmed by Mark Cannon [phone]) on 05/14/2022 4:15:44 PM  Radiology CT ANGIO HEAD NECK W WO CM  Result Date: 05/14/2022 CLINICAL DATA:  Provided history: Neuro deficit, acute, stroke suspected. Additional history provided: Right eye blurred vision. EXAM: CT ANGIOGRAPHY HEAD AND NECK TECHNIQUE: Multidetector CT imaging of the head and neck was performed using the standard protocol during bolus administration of intravenous contrast. Multiplanar CT image reconstructions and MIPs were obtained to evaluate the vascular anatomy. Carotid stenosis measurements (when applicable) are obtained utilizing NASCET criteria, using the distal internal carotid diameter as the denominator. RADIATION DOSE REDUCTION: This exam was performed according to the departmental dose-optimization program which includes automated exposure control, adjustment of the mA and/or kV according to patient size and/or use of iterative reconstruction technique. CONTRAST:  107m OMNIPAQUE IOHEXOL 350 MG/ML SOLN COMPARISON:  Brain MRI 01/27/2013. FINDINGS: CT HEAD FINDINGS Brain: Mild generalized parenchymal atrophy. Minimal patchy and ill-defined hypoattenuation within the cerebral white matter, nonspecific but compatible with chronic small vessel ischemic disease. There is no acute intracranial hemorrhage. No demarcated cortical infarct. No extra-axial fluid collection. No evidence of an intracranial mass. No midline shift. Vascular: No hyperdense vessel. Atherosclerotic calcifications. Skull: No fracture or aggressive osseous lesion. Sinuses/Orbits: No  orbital mass or acute orbital finding. No significant paranasal sinus disease. Review of the MIP images confirms the above findings CTA NECK FINDINGS Aortic arch: Standard aortic branching. Atherosclerotic plaque within the visualized aortic arch and proximal major branch vessels of the neck. Streak and beam hardening artifact arising from a dense right-sided contrast bolus partially obscures the right subclavian artery. Within this limitation, there is no appreciable hemodynamically significant innominate or proximal subclavian artery stenosis. Right carotid system: CCA and ICA patent within the neck without stenosis. Atherosclerotic plaque. Most notably, there is predominantly  soft plaque within the proximal ICA resulting in 50-60% stenosis. Left carotid system: CCA and ICA patent within the neck. Atherosclerotic plaque. Most notably, there is soft and calcified plaque about the carotid bifurcation and within the proximal ICA resulting in a 50-60% stenosis within the proximal ICA. Also of note, there is ulcerated plaque within the carotid bulb. Vertebral arteries: Vertebral arteries patent within the neck without stenosis. The right vertebral artery is dominant. Nonstenotic atherosclerotic plaque at the origin of the left vertebral artery. Skeleton: Straightening of the expected cervical spondylosis. Slight C2-C3 grade 1 anterolisthesis. Cervical spondylosis. Prior C5-C7 ACDF. No acute fracture or aggressive osseous lesion. Other neck: No neck mass or cervical lymphadenopathy. Upper chest: No consolidation within the imaged lung apices. Review of the MIP images confirms the above findings CTA HEAD FINDINGS Anterior circulation: The intracranial internal carotid arteries are patent. Atherosclerotic plaque within both vessels with no more than mild stenosis. Up to moderate stenosis within the cavernous segment on the right. Otherwise, no more than mild stenosis within the intracranial ICAs. The M1 middle cerebral  arteries are patent. No M2 proximal branch occlusion or high-grade proximal stenosis. The anterior cerebral arteries are patent. 2-3 mm inferiorly projecting vascular protrusion arising from the paraclinoid ICA, which appears to reflect an infundibulum. Posterior circulation: The non dominant intracranial left vertebral artery is developmentally diminutive beyond the origin of the left PICA, but patent. The dominant right vertebral artery is patent intracranially without stenosis. The basilar artery is patent. The posterior cerebral arteries are patent. A left posterior communicating artery is present. The right posterior communicating artery is diminutive or absent Venous sinuses: Within the limitations of contrast timing, no convincing thrombus. Anatomic variants: As described. Review of the MIP images confirms the above findings IMPRESSION: CT head: 1. No evidence of acute intracranial abnormality. 2. Minimal chronic small-vessel ischemic changes within the cerebral white matter. 3. Mild generalized parenchymal atrophy. CTA neck: 1. The common carotid and internal carotid arteries are patent within the neck. Atherosclerotic plaque results in 50-60% stenosis within the proximal internal carotid arteries, bilaterally. Ulcerated plaque within the left carotid bulb. 2. Vertebral arteries patent within the neck without stenosis. 3. Aortic Atherosclerosis (ICD10-I70.0). CTA head: 1. No intracranial large vessel occlusion is identified. 2. Atherosclerotic plaque within the intracranial ICAs, bilaterally. Up to moderate stenosis within the cavernous segment on the right. Electronically Signed   By: Kellie Simmering D.O.   On: 05/14/2022 15:12    Procedures .Critical Care  Performed by: Mark Sites, DO Authorized by: Mark Sites, DO   Critical care provider statement:    Critical care time (minutes):  35   Critical care was necessary to treat or prevent imminent or life-threatening deterioration of the  following conditions:  CNS failure or compromise   Critical care was time spent personally by me on the following activities:  Blood draw for specimens, development of treatment plan with patient or surrogate, evaluation of patient's response to treatment, discussions with primary provider, examination of patient, obtaining history from patient or surrogate, ordering and performing treatments and interventions, ordering and review of laboratory studies, ordering and review of radiographic studies, pulse oximetry, re-evaluation of patient's condition and review of old charts   Care discussed with: admitting provider       Medications Ordered in ED Medications  aspirin EC tablet 325 mg (has no administration in time range)  iohexol (OMNIPAQUE) 350 MG/ML injection 75 mL (75 mLs Intravenous Contrast Given 05/14/22 1437)    ED Course/  Medical Decision Making/ A&P                           Medical Decision Making Risk OTC drugs. Decision regarding hospitalization.   BRION SOSSAMON is here for stroke work-up.  Was diagnosed at ophthalmologist today with a retinal artery occlusion.  6 days of central vision loss that is painless in his right eye.  Diagnosed clinically with ophthalmologist.  Otherwise he is neuro intact.  Talked with Dr. Su Monks with neurology who recommends admission for stroke care.  Admitted to Dr. Roosevelt Locks with hospitalist group.  He had basic labs of CBC and BMP per my review and interpretation were unremarkable.  He had a CTA of his head and neck that was unremarkable.  We will get further stroke care and optimization inpatient.  This chart was dictated using voice recognition software.  Despite best efforts to proofread,  errors can occur which can change the documentation meaning.         Final Clinical Impression(s) / ED Diagnoses Final diagnoses:  Retinal artery occlusion, branch, right    Rx / DC Orders ED Discharge Orders     None         Mark Sites, DO 05/14/22 1651

## 2022-05-14 NOTE — ED Provider Triage Note (Signed)
Emergency Medicine Provider Triage Evaluation Note  Mark Cannon , a 71 y.o. male  was evaluated in triage.  Pt complains of decreased vision in right eye over the last week.  Vision decreased to the upper half of his eye.  He was seen by [place] specialist, Dr.Govnid who sent patient over for CVA work-up.  Patient stated he had a retinal artery branch occlusion in the right eye.  He has no numbness, weakness, facial droop.  No history of similar. No trauma to eye.  Review of Systems  Positive: Right eye vision changes Negative:   Physical Exam  BP (!) 124/91 (BP Location: Right Arm)   Pulse 84   Temp 98.2 F (36.8 C) (Oral)   Resp 18   SpO2 92%  Gen:   Awake, no distress   Resp:  Normal effort  Eye:  Decreased vision upper visual field in right eye MSK:   Moves extremities without difficulty  Other:  Equal strength, intact sensation  Medical Decision Making  Medically screening exam initiated at 12:29 PM.  Appropriate orders placed.  Mark Cannon was informed that the remainder of the evaluation will be completed by another provider, this initial triage assessment does not replace that evaluation, and the importance of remaining in the ED until their evaluation is complete.  CVA WU   Kimori Tartaglia A, PA-C 05/14/22 1231

## 2022-05-14 NOTE — Consult Note (Signed)
Neurology Consultation  Reason for Consult: CRAO Referring Physician: Dr. Ronnald Nian  CC: Right eye vision loss  History is obtained from: Chart review, patient  HPI: Mark Cannon is a 71 y.o. male with a medical history significant for hypertension, OSA not on CPAP, hyperlipidemia with statin intolerance, obesity with a BMI of 35.28 kg/m, CKD stage II, prostate cancer status post prostate removal and radiation, and 55-pack-year smoking history who presented to the ED 8/1 from his retina specialist.  Patient states that on Wednesday, July 26 he woke up with severe, constant, cloudy, right eye central vision loss.  Patient states that he has a tan/gray cloud his vision in his central visual field just above midline.  He was evaluated at his optometrist the following day and was referred to a retina specialist.  His retinal specialist appointment was today where he was diagnosed with a retinal artery branch occlusion of the right eye and sent from the office to the ED for stroke work-up.  ROS: A complete ROS was performed and is negative except as noted in the HPI.   Past Medical History:  Diagnosis Date   Arthritis    Cancer [facility])    Prostate, has had prostate removed, has had radiation and still has cancer,   Colon polyps    Cough    GERD (gastroesophageal reflux disease)    occasional    H/O tooth extraction week of 03/15/2015    History of kidney stones    Hypercholesteremia    Hypertension    Lumbar stenosis with neurogenic claudication    Pneumonia    Pre-diabetes    Renal disorder    Sleep apnea    No cpap   Past Surgical History:  Procedure Laterality Date   APPENDECTOMY     BACK SURGERY     CHOLECYSTECTOMY     COLONOSCOPY  02/04/2012   Procedure: COLONOSCOPY;  Surgeon: Jamesetta So, MD;  Location: [facility];  Service: Gastroenterology;  Laterality: N/A;   COLONOSCOPY N/A 09/22/2018   Procedure: COLONOSCOPY;  Surgeon: Aviva Signs, MD;  Location: [facility];   Service: Gastroenterology;  Laterality: N/A;   COLONOSCOPY W/ POLYPECTOMY     HERNIA REPAIR     Bilateral inguinal hernia repair X 4   hydrocelectomy     left knee arthroscopy     LITHOTRIPSY     LYMPHADENECTOMY Bilateral 03/22/2015   Procedure: PELVIC LYMPHADENECTOMY;  Surgeon: Alexis Frock, MD;  Location: [facility];  Service: Urology;  Laterality: Bilateral;   NM MYOVIEW LTD  2011   POLYPECTOMY  09/22/2018   Procedure: POLYPECTOMY;  Surgeon: Aviva Signs, MD;  Location: [facility];  Service: Gastroenterology;;   [place] N/A 03/22/2015   Procedure: ROBOTIC ASSISTED LAPAROSCOPIC RADICAL PROSTATECTOMY WITH INDOCYANINE GREEN DYE INJECTION;  Surgeon: Alexis Frock, MD;  Location: [facility];  Service: Urology;  Laterality: N/A;   ROBOTIC ASSISTED LAPAROSCOPIC LYSIS OF ADHESION  03/22/2015   Procedure: ROBOTIC ASSISTED LAPAROSCOPIC LYSIS OF ADHESION;  Surgeon: Alexis Frock, MD;  Location: [facility];  Service: Urology;;   SHOULDER SURGERY Right    VASECTOMY     Family History  Problem Relation Age of Onset   Heart failure Father    Stroke Father    Colon cancer Neg Hx    Social History:   reports that he has been smoking cigarettes. He has a 55.00 pack-year smoking history. He has never used smokeless tobacco. He reports current alcohol use of about  1.0 standard drink of alcohol per week. He reports that he does not use drugs.  Medications  Current Facility-Administered Medications:    [START ON 05/15/2022]  stroke: early stages of recovery book, , Does not apply, Once, Wynetta Fines T, MD   0.9 %  sodium chloride infusion, , Intravenous, Continuous, Zhang, Ralene Cork, MD   acetaminophen (TYLENOL) tablet 650 mg, 650 mg, Oral, Q4H PRN **OR** acetaminophen (TYLENOL) 160 MG/5ML solution 650 mg, 650 mg, Per Tube, Q4H PRN **OR** acetaminophen (TYLENOL) suppository 650 mg, 650 mg, Rectal, Q4H PRN, Lequita Halt, MD   [START ON 05/15/2022] amLODipine (NORVASC)  tablet 10 mg, 10 mg, Oral, Daily, Wynetta Fines T, MD   aspirin EC tablet 325 mg, 325 mg, Oral, Once, Wynetta Fines T, MD   [START ON 05/15/2022] aspirin EC tablet 81 mg, 81 mg, Oral, Daily, Zhang, Ping T, MD   enoxaparin (LOVENOX) injection 40 mg, 40 mg, Subcutaneous, Q24H, Zhang, Ping T, MD   [START ON 05/15/2022] hydrochlorothiazide (HYDRODIURIL) tablet 25 mg, 25 mg, Oral, Daily, [place], Ping T, MD   [START ON 05/15/2022] irbesartan (AVAPRO) tablet 150 mg, 150 mg, Oral, Daily, Zhang, Ping T, MD   LORazepam (ATIVAN) tablet 0.5 mg, 0.5 mg, Oral, Q6H PRN, Wynetta Fines T, MD   [START ON 05/15/2022] meloxicam (MOBIC) tablet 15 mg, 15 mg, Oral, Daily, Zhang, Pearletha [place]ge T, MD   nicotine (NICODERM CQ - dosed in mg/24 hours) patch 21 mg, 21 mg, Transdermal, Daily, Zhang, Ping T, MD   oxyCODONE (ROXICODONE) 5 MG/5ML solution 5 mg, 5 mg, Oral, Q6H PRN, Wynetta Fines T, MD  Current Outpatient Medications:    amLODipine (NORVASC) 10 MG tablet, Take 10 mg by mouth daily., Disp: , Rfl:    hydrochlorothiazide (HYDRODIURIL) 25 MG tablet, Take 25 mg by mouth daily., Disp: , Rfl:    meloxicam (MOBIC) 15 MG tablet, Take 15 mg by mouth daily., Disp: , Rfl:    olmesartan (BENICAR) 20 MG tablet, Take 20 mg by mouth daily., Disp: , Rfl:   Exam: Current vital signs: BP 123/68   Pulse 79   Temp 98 F (36.7 C) (Oral)   Resp 20   Ht '5\' 8"'$  (1.727 m)   Wt 105.2 kg   SpO2 92%   BMI 35.28 kg/m  Vital signs in last 24 hours: Temp:  [98 F (36.7 C)-98.3 F (36.8 C)] 98 F (36.7 C) (08/01 1625) Pulse Rate:  [79-85] 79 (08/01 1625) Resp:  [17-20] 20 (08/01 1625) BP: (123-125)/(68-95) 123/68 (08/01 1625) SpO2:  [92 %-95 %] 92 % (08/01 1625) Weight:  [105.2 kg] 105.2 kg (08/01 1233)  GENERAL: Awake, alert, in no acute distress Psych: Affect appropriate for situation, patient is calm and cooperative with examination Head: Normocephalic and atraumatic, without obvious abnormality EENT: Normal conjunctivae, dry mucous membranes, no  OP obstruction LUNGS: Normal respiratory effort. Non-labored breathing on room air CV: Regular rate and rhythm on telemetry ABDOMEN: Rounded, nontender Extremities: cool to touch, without edema  NEURO:  Mental Status: Awake, alert, and oriented to person, place, time, and situation. He is able to provide a clear and coherent history of present illness. Speech/Language: speech is without dysarthria Naming, repetition, fluency, and comprehension intact without aphasia  No neglect is noted Cranial Nerves:  II: Right pupil 5 mm --> 24m, briskly reactive to light left eye 4 mm --> 252mbriskly reactive to light.  Right eye central vision loss in the superior central visual field, described as a cloud in the shape  of [place] that is tan/gray.  Inferior peripheral visual field intact. Patient endorses greater field of vision in the inferior periphery than the superior periphery. He is able to count fingers throughout the inferior visual field but only on the right upper quadrant of the superior visual field. III, IV, VI: EOMI without ptosis or nystagmus V: Sensation is intact to light touch and symmetrical to face.  VII: Face is symmetric resting and smiling.  VIII: Hearing intact to voice IX, X: Palate elevation is symmetric. Phonation normal.  XI: Normal sternocleidomastoid and trapezius muscle strength XII: Tongue protrudes midline without fasciculations.   Motor: 5/5 strength is all muscle groups without vertical drift or asymmetry.  Tone is normal. Bulk is normal.  Sensation: Intact to light touch bilaterally in all four extremities. No extinction to DSS present.  Coordination: FTN intact bilaterally. HKS intact bilaterally. No pronator drift.  DTRs: 2+ and symmetric throughout.  Gait: Deferred  NIHSS: 0  Labs I have reviewed labs in epic and the results pertinent to this consultation are: CBC    Component Value Date/Time   WBC 10.9 (H) 05/14/2022 1246   RBC 5.52 05/14/2022 1246    HGB 14.3 05/14/2022 1246   HCT 47.9 05/14/2022 1246   PLT 301 05/14/2022 1246   MCV 86.8 05/14/2022 1246   MCH 25.9 (L) 05/14/2022 1246   MCHC 29.9 (L) 05/14/2022 1246   RDW 14.9 05/14/2022 1246   LYMPHSABS 1.9 05/14/2022 1246   MONOABS 0.7 05/14/2022 1246   EOSABS 0.3 05/14/2022 1246   BASOSABS 0.1 05/14/2022 1246   CMP     Component Value Date/Time   NA 136 05/14/2022 1246   K 4.3 05/14/2022 1246   CL 99 05/14/2022 1246   CO2 27 05/14/2022 1246   GLUCOSE 120 (H) 05/14/2022 1246   BUN 20 05/14/2022 1246   CREATININE 1.46 (H) 05/14/2022 1246   CALCIUM 9.1 05/14/2022 1246   PROT 7.1 03/25/2019 2126   ALBUMIN 3.8 03/25/2019 2126   AST 21 03/25/2019 2126   ALT 31 03/25/2019 2126   ALKPHOS 66 03/25/2019 2126   BILITOT 0.4 03/25/2019 2126   GFRNONAA 51 (L) 05/14/2022 1246   GFRAA >60 03/25/2019 2126   Lipid Panel  No results found for: "CHOL", "TRIG", "HDL", "CHOLHDL", "VLDL", "LDLCALC", "LDLDIRECT" Lab Results  Component Value Date   HGBA1C 6.2 (H) 06/01/2018   Imaging I have reviewed the images obtained:  CT head:   1. No evidence of acute intracranial abnormality. 2. Minimal chronic small-vessel ischemic changes within the cerebral white matter. 3. Mild generalized parenchymal atrophy.   CTA neck:   1. The common carotid and internal carotid arteries are patent within the neck. Atherosclerotic plaque results in 50-60% stenosis within the proximal internal carotid arteries, bilaterally. Ulcerated plaque within the left carotid bulb. 2. Vertebral arteries patent within the neck without stenosis. 3. Aortic Atherosclerosis (ICD10-I70.0).   CTA head:   1. No intracranial large vessel occlusion is identified. 2. Atherosclerotic plaque within the intracranial ICAs, bilaterally. Up to moderate stenosis within the cavernous segment on the right.  MRI examination of the brain pending  Assessment: 71 year old male with history as above who presented to the ED on  8/1 from his retinal specialist office for suspected BRAO and further stroke work-up following right eye central vision loss x 6 days. -Examination reveals right eye central vision loss without further neurologic deficits noted. -Vessel imaging reveals atherosclerotic plaque in 50 to 60% stenosis in bilateral ICAs.  Also noted was  an ulcerated plaque within the left carotid bulb. -Stroke risk factors include obesity, family history of stroke, advanced age, tobacco use, hypertension, hyperlipidemia, OSA not on CPAP.  Recommendations: -MRI brain pending - HgbA1c, fasting lipid panel - Frequent neuro checks - Echocardiogram - Prophylactic therapy-Antiplatelet med: Aspirin - dose '325mg'$  PO or '300mg'$  PR  - Goal normotension due to symptom onset 6 days PTA - Risk factor modification; counseled to quit smoking - Telemetry monitoring - PT consult, OT consult - Stroke team to follow  Pt seen by NP/Neuro and later by MD. Note/plan to be edited by MD as needed.  Anibal Henderson, AGAC-NP [facility] Pager: [phone]   Attending Neurohospitalist Addendum Patient seen and examined with APP/Resident. Agree with the history and physical as documented above. Agree with the plan as documented, which I helped formulate. I have edited the note above to reflect my full findings and recommendations. I have independently reviewed the chart, obtained history, review of systems and examined the patient.I have personally reviewed pertinent head/neck/spine imaging (CT/MRI). Please feel free to call with any questions.  -- Su Monks, MD [facility] [phone]  If 7pm- 7am, please page neurology on call as listed in [place].

## 2022-05-14 NOTE — H&P (Signed)
History and Physical    Mark Cannon NWG:[id number] DOB: 1951/01/15 DOA: 05/14/2022  PCP: Jake Samples, PA-C (Confirm with patient/family/NH records and if not entered, this has to be entered at [facility] point of entry) Patient coming from: Home  I have personally briefly reviewed patient's old medical records in [place]  Chief Complaint: Right eye vision changes  HPI: Mark Cannon is a 71 y.o. male with medical history significant of HTN, HLD not tolerating statin, OSA not on CPAP, CKD stage II, cigar smoke, lumbar stenosis on narcotics, prostate cancer status post prostatectomy, came with persistent right eye vision changes.  Symptoms started 7 days ago, patient woke up with right eye central vision loss which he described as " clouds hanging in the middle" denies any weakness numbness of any of the limbs no right eye vision changes no hearing changes.  She went to see ophthalmologist 3 days ago, who suspected " fluid around retina" and referred him to see another specialist today.  On today's ophthalmology examination, right retinal artery branch occlusion suspected and patient sent to ED for stroke work-up.  Patient has a prior history of risk factor control, is a chronic smoker for 54 years, HLD last LDL was 230s but not tolerating statins, OSA not on CPAP.  Has a [facility] diagnosis of emphysema but never had a formal lung function test.  ED Course: CT angiogram showed 50-60% stenosis within the proximal internal artery bilaterally.  Review of Systems: As per HPI otherwise 14 point review of systems negative.    Past Medical History:  Diagnosis Date   Arthritis    Cancer [facility])    Prostate, has had prostate removed, has had radiation and still has cancer,   Colon polyps    Cough    GERD (gastroesophageal reflux disease)    occasional    H/O tooth extraction week of 03/15/2015    History of kidney stones    Hypercholesteremia    Hypertension    Lumbar stenosis with  neurogenic claudication    Pneumonia    Pre-diabetes    Renal disorder    Sleep apnea    No cpap    Past Surgical History:  Procedure Laterality Date   APPENDECTOMY     BACK SURGERY     CHOLECYSTECTOMY     COLONOSCOPY  02/04/2012   Procedure: COLONOSCOPY;  Surgeon: Jamesetta So, MD;  [facility];  Service: Gastroenterology;  Laterality: N/A;   COLONOSCOPY N/A 09/22/2018   Procedure: COLONOSCOPY;  Surgeon: Aviva Signs, MD;  Location: [facility];  Service: Gastroenterology;  Laterality: N/A;   COLONOSCOPY W/ POLYPECTOMY     HERNIA REPAIR     Bilateral inguinal hernia repair X 4   hydrocelectomy     left knee arthroscopy     LITHOTRIPSY     LYMPHADENECTOMY Bilateral 03/22/2015   Procedure: PELVIC LYMPHADENECTOMY;  Surgeon: Alexis Frock, MD;  Location: [facility];  Service: Urology;  Laterality: Bilateral;   NM MYOVIEW LTD  2011   POLYPECTOMY  09/22/2018   Procedure: POLYPECTOMY;  Surgeon: Aviva Signs, MD;  Location: [facility];  Service: Gastroenterology;;   [place] N/A 03/22/2015   Procedure: ROBOTIC ASSISTED LAPAROSCOPIC RADICAL PROSTATECTOMY WITH INDOCYANINE GREEN DYE INJECTION;  Surgeon: Alexis Frock, MD;  Location: [facility];  Service: Urology;  Laterality: N/A;   ROBOTIC ASSISTED LAPAROSCOPIC LYSIS OF ADHESION  03/22/2015   Procedure: ROBOTIC ASSISTED LAPAROSCOPIC LYSIS OF ADHESION;  Surgeon: Hubbard Robinson  Tresa Moore, MD;  Location: [facility];  Service: Urology;;   SHOULDER SURGERY Right    VASECTOMY       reports that he has been smoking cigarettes. He has a 55.00 pack-year smoking history. He has never used smokeless tobacco. He reports current alcohol use of about 1.0 standard drink of alcohol per week. He reports that he does not use drugs.  Allergies  Allergen Reactions   Lidocaine Hives   Benzocaine Other (See Comments)    Blisters    Other     Novocaine - blisters in mouth    Family History  Problem Relation  Age of Onset   Heart failure Father    Stroke Father    Colon cancer Neg Hx      Prior to Admission medications   Medication Sig Start Date End Date Taking? Authorizing Provider  amLODipine (NORVASC) 10 MG tablet Take 10 mg by mouth daily.    [provider]  cetirizine (ZYRTEC) 10 MG tablet Take 10 mg by mouth daily.    [provider]  hydrochlorothiazide (HYDRODIURIL) 25 MG tablet Take 25 mg by mouth daily.    [provider]  meloxicam (MOBIC) 15 MG tablet Take 15 mg by mouth daily. 01/27/18   [provider]  olmesartan (BENICAR) 20 MG tablet Take 20 mg by mouth daily.    [provider]  OXYCODONE HCL PO Take by mouth. Takes about one half a week prn    [provider]    Physical Exam: Vitals:   05/14/22 1226 05/14/22 1233 05/14/22 1413 05/14/22 1625  BP: (!) 124/91  (!) 125/95 123/68  Pulse: 84  85 79  Resp: '18  17 20  '$ Temp: 98.2 F (36.8 C)  98.3 F (36.8 C) 98 F (36.7 C)  TempSrc: Oral  Oral Oral  SpO2: 92%  95% 92%  Weight:  105.2 kg    Height:  '5\' 8"'$  (1.727 m)      Constitutional: NAD, calm, comfortable Vitals:   05/14/22 1226 05/14/22 1233 05/14/22 1413 05/14/22 1625  BP: (!) 124/91  (!) 125/95 123/68  Pulse: 84  85 79  Resp: '18  17 20  '$ Temp: 98.2 F (36.8 C)  98.3 F (36.8 C) 98 F (36.7 C)  TempSrc: Oral  Oral Oral  SpO2: 92%  95% 92%  Weight:  105.2 kg    Height:  '5\' 8"'$  (1.727 m)     Eyes: PERRL, lids and conjunctivae normal ENMT: Mucous membranes are moist. Posterior pharynx clear of any exudate or lesions.Normal dentition.  Neck: normal, supple, no masses, no thyromegaly Respiratory: clear to auscultation bilaterally, no wheezing, no crackles. Normal respiratory effort. No accessory muscle use.  Cardiovascular: Regular rate and rhythm, no murmurs / rubs / gallops. No extremity edema. 2+ pedal pulses. No carotid bruits.  Abdomen: no tenderness, no masses palpated. No hepatosplenomegaly. Bowel  sounds positive.  Musculoskeletal: no clubbing / cyanosis. No joint deformity upper and lower extremities. Good ROM, no contractures. Normal muscle tone.  Skin: no rashes, lesions, ulcers. No induration Neurologic: CN 2-12 grossly intact. Sensation intact, DTR normal. Strength 5/5 in all 4.  Psychiatric: Normal judgment and insight. Alert and oriented x 3. Normal mood.      Labs on Admission: I have personally reviewed following labs and imaging studies  CBC: Recent Labs  Lab 05/14/22 1246  WBC 10.9*  NEUTROABS 7.9*  HGB 14.3  HCT 47.9  MCV 86.8  PLT 983   Basic Metabolic Panel:  Recent Labs  Lab 05/14/22 1246  NA 136  K 4.3  CL 99  CO2 27  GLUCOSE 120*  BUN 20  CREATININE 1.46*  CALCIUM 9.1   GFR: Estimated Creatinine Clearance: 54.5 mL/min (A) (by C-G formula based on SCr of 1.46 mg/dL (H)). Liver Function Tests: No results for input(s): "AST", "ALT", "ALKPHOS", "BILITOT", "PROT", "ALBUMIN" in the last 168 hours. No results for input(s): "LIPASE", "AMYLASE" in the last 168 hours. No results for input(s): "AMMONIA" in the last 168 hours. Coagulation Profile: Recent Labs  Lab 05/14/22 1246  INR 1.0   Cardiac Enzymes: No results for input(s): "CKTOTAL", "CKMB", "CKMBINDEX", "TROPONINI" in the last 168 hours. BNP (last 3 results) No results for input(s): "PROBNP" in the last 8760 hours. HbA1C: No results for input(s): "HGBA1C" in the last 72 hours. CBG: No results for input(s): "GLUCAP" in the last 168 hours. Lipid Profile: No results for input(s): "CHOL", "HDL", "LDLCALC", "TRIG", "CHOLHDL", "LDLDIRECT" in the last 72 hours. Thyroid Function Tests: No results for input(s): "TSH", "T4TOTAL", "FREET4", "T3FREE", "THYROIDAB" in the last 72 hours. Anemia Panel: No results for input(s): "VITAMINB12", "FOLATE", "FERRITIN", "TIBC", "IRON", "RETICCTPCT" in the last 72 hours. Urine analysis:    Component Value Date/Time   COLORURINE YELLOW 12/01/2016 1022    APPEARANCEUR CLEAR 12/01/2016 1022   LABSPEC 1.018 12/01/2016 1022   PHURINE 6.0 12/01/2016 1022   GLUCOSEU NEGATIVE 12/01/2016 1022   HGBUR NEGATIVE 12/01/2016 [place] 12/01/2016 [place] 12/01/2016 1022   PROTEINUR NEGATIVE 12/01/2016 1022   NITRITE NEGATIVE 12/01/2016 [place] 12/01/2016 1022    Radiological Exams on Admission: CT ANGIO HEAD NECK W WO CM  Result Date: 05/14/2022 CLINICAL DATA:  Provided history: Neuro deficit, acute, stroke suspected. Additional history provided: Right eye blurred vision. EXAM: CT ANGIOGRAPHY HEAD AND NECK TECHNIQUE: Multidetector CT imaging of the head and neck was performed using the standard protocol during bolus administration of intravenous contrast. Multiplanar CT image reconstructions and MIPs were obtained to evaluate the vascular anatomy. Carotid stenosis measurements (when applicable) are obtained utilizing NASCET criteria, using the distal internal carotid diameter as the denominator. RADIATION DOSE REDUCTION: This exam was performed according to the departmental dose-optimization program which includes automated exposure control, adjustment of the mA and/or kV according to patient size and/or use of iterative reconstruction technique. CONTRAST:  58m OMNIPAQUE IOHEXOL 350 MG/ML SOLN COMPARISON:  Brain MRI 01/27/2013. FINDINGS: CT HEAD FINDINGS Brain: Mild generalized parenchymal atrophy. Minimal patchy and ill-defined hypoattenuation within the cerebral white matter, nonspecific but compatible with chronic small vessel ischemic disease. There is no acute intracranial hemorrhage. No demarcated cortical infarct. No extra-axial fluid collection. No evidence of an intracranial mass. No midline shift. Vascular: No hyperdense vessel. Atherosclerotic calcifications. Skull: No fracture or aggressive osseous lesion. Sinuses/Orbits: No orbital mass or acute orbital finding. No significant paranasal sinus  disease. Review of the MIP images confirms the above findings CTA NECK FINDINGS Aortic arch: Standard aortic branching. Atherosclerotic plaque within the visualized aortic arch and proximal major branch vessels of the neck. Streak and beam hardening artifact arising from a dense right-sided contrast bolus partially obscures the right subclavian artery. Within this limitation, there is no appreciable hemodynamically significant innominate or proximal subclavian artery stenosis. Right carotid system: CCA and ICA patent within the neck without stenosis. Atherosclerotic plaque. Most notably, there is predominantly soft plaque within the proximal ICA resulting in 50-60% stenosis. Left carotid system: CCA and ICA patent within the  neck. Atherosclerotic plaque. Most notably, there is soft and calcified plaque about the carotid bifurcation and within the proximal ICA resulting in a 50-60% stenosis within the proximal ICA. Also of note, there is ulcerated plaque within the carotid bulb. Vertebral arteries: Vertebral arteries patent within the neck without stenosis. The right vertebral artery is dominant. Nonstenotic atherosclerotic plaque at the origin of the left vertebral artery. Skeleton: Straightening of the expected cervical spondylosis. Slight C2-C3 grade 1 anterolisthesis. Cervical spondylosis. Prior C5-C7 ACDF. No acute fracture or aggressive osseous lesion. Other neck: No neck mass or cervical lymphadenopathy. Upper chest: No consolidation within the imaged lung apices. Review of the MIP images confirms the above findings CTA HEAD FINDINGS Anterior circulation: The intracranial internal carotid arteries are patent. Atherosclerotic plaque within both vessels with no more than mild stenosis. Up to moderate stenosis within the cavernous segment on the right. Otherwise, no more than mild stenosis within the intracranial ICAs. The M1 middle cerebral arteries are patent. No M2 proximal branch occlusion or high-grade  proximal stenosis. The anterior cerebral arteries are patent. 2-3 mm inferiorly projecting vascular protrusion arising from the paraclinoid ICA, which appears to reflect an infundibulum. Posterior circulation: The non dominant intracranial left vertebral artery is developmentally diminutive beyond the origin of the left PICA, but patent. The dominant right vertebral artery is patent intracranially without stenosis. The basilar artery is patent. The posterior cerebral arteries are patent. A left posterior communicating artery is present. The right posterior communicating artery is diminutive or absent Venous sinuses: Within the limitations of contrast timing, no convincing thrombus. Anatomic variants: As described. Review of the MIP images confirms the above findings IMPRESSION: CT head: 1. No evidence of acute intracranial abnormality. 2. Minimal chronic small-vessel ischemic changes within the cerebral white matter. 3. Mild generalized parenchymal atrophy. CTA neck: 1. The common carotid and internal carotid arteries are patent within the neck. Atherosclerotic plaque results in 50-60% stenosis within the proximal internal carotid arteries, bilaterally. Ulcerated plaque within the left carotid bulb. 2. Vertebral arteries patent within the neck without stenosis. 3. Aortic Atherosclerosis (ICD10-I70.0). CTA head: 1. No intracranial large vessel occlusion is identified. 2. Atherosclerotic plaque within the intracranial ICAs, bilaterally. Up to moderate stenosis within the cavernous segment on the right. Electronically Signed   By: Kellie Simmering D.O.   On: 05/14/2022 15:12    EKG: Independently reviewed.  Sinus rhythm, no acute ST changes.  Clearly there are P waves on V2 leads.  Assessment/Plan Principal Problem:   Stroke (cerebrum) ([place])  (please populate well all problems here in Problem List. (For example, if patient is on BP meds at home and you resume or decide to hold them, it is a problem that needs to be  her. Same for CAD, COPD, HLD and so on)  Acute right central vision loss -Question of embolic stroke.  Monitoring 24 hours, echocardiogram, if both negative, expect patient discharged home and follow-up with cardiology for Zio patch. -Risk factor modification, patient has history of poor tolerance to statin, discussed with patient regarding outpatient follow-up with cardiology to initiate [place].  Smoking cessation consultation performed.  OSA needs to be addressed as patient not tolerating CPAP 10 years ago. -Start aspirin -A1c and lipid panel  CKD stage II -Received IV contrast today, IV fluid x24 hours for nephro protection, repeat kidney function tomorrow.  HTN -Resume home BP meds HCTZ, amlodipine and irbesartan  HLD -As above.  COPD, undiagnosed -Wife noticed patient pulse ox drop when ambulating and patient reported  episodes of wheezing and cough but not right now.  CT chest outpatient showed centrilobular emphysema.  Compatible with COPD.  Recommend patient follow-up with pulmonary for formal lung function test.  Cigarette smoke -Cessation consultation performed at bedside -Nicotine patch  OSA -Not on CPAP due to poor tolerance, outpatient follow-up with pulmonary  Chronic narcotic use for back pain, history of prostate cancer -No significant acute issue.  DVT prophylaxis: Lovenox Code Status: Full code Family Communication: Wife at bedside Disposition Plan: Expect less than 2 midnight hospital stay Consults called: Neurology Admission status: Telemetry observation   Lequita Halt MD [facility] Pager [phone]  05/14/2022, 5:01 PM

## 2022-05-14 NOTE — ED Notes (Signed)
Patient assisted to the bathroom 

## 2022-05-15 ENCOUNTER — Telehealth (HOSPITAL_COMMUNITY): Payer: Self-pay | Admitting: Pharmacy Technician

## 2022-05-15 ENCOUNTER — Other Ambulatory Visit (HOSPITAL_COMMUNITY): Payer: Self-pay

## 2022-05-15 ENCOUNTER — Observation Stay (HOSPITAL_BASED_OUTPATIENT_CLINIC_OR_DEPARTMENT_OTHER): Payer: Medicare Other

## 2022-05-15 DIAGNOSIS — I639 Cerebral infarction, unspecified: Secondary | ICD-10-CM | POA: Diagnosis not present

## 2022-05-15 DIAGNOSIS — I6389 Other cerebral infarction: Secondary | ICD-10-CM | POA: Diagnosis not present

## 2022-05-15 LAB — ECHOCARDIOGRAM COMPLETE
Area-P 1/2: 3.53 cm2
Calc EF: 64.5 %
Height: 68 in
S' Lateral: 2.6 cm
Single Plane A2C EF: 62.7 %
Single Plane A4C EF: 65.6 %
Weight: 3712 oz

## 2022-05-15 LAB — LIPID PANEL
Cholesterol: 208 mg/dL — ABNORMAL HIGH (ref 0–200)
HDL: 26 mg/dL — ABNORMAL LOW (ref >40)
LDL Cholesterol: 128 mg/dL — ABNORMAL HIGH (ref 0–99)
Total CHOL/HDL Ratio: 8 RATIO
Triglycerides: 268 mg/dL — ABNORMAL HIGH (ref <150)
VLDL: 54 mg/dL — ABNORMAL HIGH (ref 0–40)

## 2022-05-15 LAB — BASIC METABOLIC PANEL
Anion gap: 8 (ref 5–15)
BUN: 17 mg/dL (ref 8–23)
CO2: 30 mmol/L (ref 22–32)
Calcium: 8.6 mg/dL — ABNORMAL LOW (ref 8.9–10.3)
Chloride: 100 mmol/L (ref 98–111)
Creatinine, Ser: 1.29 mg/dL — ABNORMAL HIGH (ref 0.61–1.24)
GFR, Estimated: 59 mL/min — ABNORMAL LOW (ref >60)
Glucose, Bld: 124 mg/dL — ABNORMAL HIGH (ref 70–99)
Potassium: 4.1 mmol/L (ref 3.5–5.1)
Sodium: 138 mmol/L (ref 135–145)

## 2022-05-15 MED ORDER — ATORVASTATIN CALCIUM 20 MG PO TABS: 20.0000 mg | ORAL_TABLET | Freq: Every day | ORAL | 0 refills | Status: DC

## 2022-05-15 MED ORDER — ROSUVASTATIN CALCIUM 20 MG PO TABS
20.0000 mg | ORAL_TABLET | Freq: Every day | ORAL | Status: DC
  Administered 2022-05-15: 20 mg via ORAL
  Filled 2022-05-15: qty 1

## 2022-05-15 MED ORDER — CLOPIDOGREL BISULFATE 75 MG PO TABS
75.0000 mg | ORAL_TABLET | Freq: Every day | ORAL | 0 refills | Status: DC
Start: 2022-05-16 — End: 2022-05-15

## 2022-05-15 MED ORDER — PERFLUTREN LIPID MICROSPHERE
1.0000 mL | INTRAVENOUS | Status: AC | PRN
  Administered 2022-05-15: 2 mL via INTRAVENOUS

## 2022-05-15 MED ORDER — ASPIRIN 81 MG PO TBEC: 81.0000 mg | DELAYED_RELEASE_TABLET | Freq: Every day | ORAL | 12 refills | Status: DC

## 2022-05-15 MED ORDER — CLOPIDOGREL BISULFATE 75 MG PO TABS: 75.0000 mg | ORAL_TABLET | Freq: Every day | ORAL | 0 refills | Status: AC

## 2022-05-15 MED ORDER — CLOPIDOGREL BISULFATE 75 MG PO TABS
75.0000 mg | ORAL_TABLET | Freq: Every day | ORAL | Status: DC
  Administered 2022-05-15: 75 mg via ORAL
  Filled 2022-05-15: qty 1

## 2022-05-15 NOTE — Evaluation (Signed)
Physical Therapy Evaluation Patient Details Name: Mark Cannon MRN: [id number] DOB: [birth year] Today's Date: 05/15/2022  History of Present Illness  71 yo male presents [facility] on [facility] with R eye blurred vision, central vision loss. MRI brain shows No acute intracranial abnormality. PMH includes HTN, HLD not tolerating statin, OSA not on CPAP, CKD stage II, cigar smoker, lumbar stenosis on narcotics, prostate cancer status post prostatectomy.  Clinical Impression   Pt presents with impaired vision R eye, impaired activity tolerance, and dyspnea on exertion with accompanying O2 desaturation. Pt to benefit from acute PT to address deficits. Pt ambulated hallway distance, requiring standing rest break and supplemental O2. RN aware. PT to progress mobility as tolerated, and will continue to follow acutely.      SATURATION QUALIFICATIONS: (This note is used to comply with regulatory documentation for home oxygen)  Patient Saturations on Room Air at Rest = 91%  Patient Saturations on Room Air while Ambulating = 84%  Patient Saturations on 2 Liters of oxygen while Ambulating = 91%  Please briefly explain why patient needs home oxygen: to maintain SpO2 >88% during mobility    Recommendations for follow up therapy are one component of a multi-disciplinary discharge planning process, led by the attending physician.  Recommendations may be updated based on patient status, additional functional criteria and insurance authorization.  Follow Up Recommendations No PT follow up      Assistance Recommended at Discharge PRN  Patient can return home with the following       Equipment Recommendations None recommended by PT  Recommendations for Other Services       Functional Status Assessment Patient has had a recent decline in their functional status and demonstrates the ability to make significant improvements in function in a reasonable and predictable amount of time.     Precautions /  Restrictions Precautions Precautions: Fall Restrictions Weight Bearing Restrictions: No      Mobility  Bed Mobility Overal bed mobility: Needs Assistance Bed Mobility: Supine to Sit     Supine to sit: Modified independent (Device/Increase time)          Transfers Overall transfer level: Needs assistance Equipment used: None Transfers: Sit to/from Stand Sit to Stand: Supervision           General transfer comment: multiple stand attempts before building enough momentum to reach standing, no physical assist.    Ambulation/Gait Ambulation/Gait assistance: Supervision Gait Distance (Feet): 140 Feet Assistive device: None Gait Pattern/deviations: Step-through pattern, Decreased stride length Gait velocity: decr     General Gait Details: pt with doe 2/4 and fatigues quickly, SPO2 84% on RA during gait requiring 2LO2.  Stairs            Wheelchair Mobility    Modified Rankin (Stroke Patients Only)       Balance Overall balance assessment: Mild deficits observed, not formally tested                                           Pertinent Vitals/Pain Pain Assessment Pain Assessment: No/denies pain    Home Living Family/patient expects to be discharged to:: Private residence Living Arrangements: Spouse/significant other Available Help at Discharge: Family;Available 24 hours/day Type of Home: House Home Access: Level entry       Home Layout: One level Home Equipment: Shower seat      Prior Function Prior Level of  Function : Independent/Modified Independent                     Hand Dominance   Dominant Hand: Right    Extremity/Trunk Assessment   Upper Extremity Assessment Upper Extremity Assessment: Defer to OT evaluation    Lower Extremity Assessment Lower Extremity Assessment: Overall WFL for tasks assessed    Cervical / Trunk Assessment Cervical / Trunk Assessment: Normal  Communication   Communication: No  difficulties  Cognition Arousal/Alertness: Awake/alert Behavior During Therapy: WFL for tasks assessed/performed Overall Cognitive Status: Within Functional Limits for tasks assessed                                          General Comments General comments (skin integrity, edema, etc.): R eye visual impairment: clouding in middle, decreased acuity above midline per pt report    Exercises     Assessment/Plan    PT Assessment Patient needs continued PT services  PT Problem List Decreased safety awareness;Decreased knowledge of precautions;Decreased activity tolerance;Cardiopulmonary status limiting activity       PT Treatment Interventions Therapeutic activities;Patient/family education;Balance training;Gait training;Therapeutic exercise;Functional mobility training    PT Goals (Current goals can be found in the Care Plan section)  Acute Rehab PT Goals PT Goal Formulation: With patient Time For Goal Achievement: 05/29/22 Potential to Achieve Goals: Good    Frequency Min 3X/week     Co-evaluation               AM-PAC PT "6 Clicks" Mobility  Outcome Measure Help needed turning from your back to your side while in a flat bed without using bedrails?: None Help needed moving from lying on your back to sitting on the side of a flat bed without using bedrails?: None Help needed moving to and from a bed to a chair (including a wheelchair)?: None Help needed standing up from a chair using your arms (e.g., wheelchair or bedside chair)?: A Little Help needed to walk in hospital room?: A Little Help needed climbing 3-5 steps with a railing? : A Little 6 Click Score: 21    End of Session Equipment Utilized During Treatment: Oxygen Activity Tolerance: Patient tolerated treatment well Patient left: in bed;with call bell/phone within reach;Other (comment) (OT at bedside) Nurse Communication: Mobility status PT Visit Diagnosis: Other abnormalities of gait and  mobility (R26.89);Muscle weakness (generalized) (M62.81)    Time: 1025-8527 PT Time Calculation (min) (ACUTE ONLY): 17 min   Charges:   PT Evaluation $PT Eval Low Complexity: 1 Low         Jeffree Cazeau S, PT DPT Acute Rehabilitation Services Pager [phone]  Office [phone]   [place] E Ruffin Pyo 05/15/2022, 1:56 PM

## 2022-05-15 NOTE — Progress Notes (Signed)
  Transition of Care [facility]) Screening Note   Patient Details  Name: Mark Cannon Date of Birth: Jan 21, 1951   Transition of Care [facility]) CM/SW Contact:    Cyndi Bender, RN Phone Number: 05/15/2022, 8:30 AM    Transition of Care Department [facility]) has reviewed patient and no TOC needs have been identified at this time. We will continue to monitor patient advancement through interdisciplinary progression rounds. If new patient transition needs arise, please place a TOC consult.

## 2022-05-15 NOTE — Progress Notes (Addendum)
SATURATION QUALIFICATIONS: (This note is used to comply with regulatory documentation for home oxygen)  Patient Saturations on Room Air at Rest = 88%  Patient Saturations on Room Air while Ambulating = 84%  Patient Saturations on 2 Liters of oxygen while Ambulating = 92%  Please briefly explain why patient needs home oxygen:desating

## 2022-05-15 NOTE — TOC Benefit Eligibility Note (Signed)
Patient Teacher, English as a foreign language completed.    The patient is currently admitted and upon discharge could be taking rosuvastatin (Crestor) 20 mg.  The current 30 day co-pay is $11.43.   The patient is currently admitted and upon discharge could be taking clopidogrel (Plavix) 75 mg.  The current 30 day co-pay is $12.13.   The patient is insured through [place], [place] Patient Advocate Specialist [place] Patient Advocate Team Direct Number: [phone]  Fax: [phone]

## 2022-05-15 NOTE — Progress Notes (Signed)
Patient discharging home. Vital signs stable at time of discharge as reflected in discharge summary. Discharge instructions given and verbal understanding returned. No questions at this time. Patient discharging with O2.

## 2022-05-15 NOTE — Telephone Encounter (Signed)
Pharmacy Patient Advocate Encounter  Insurance verification completed.    The patient is insured through [employer]   The patient is currently admitted and ran test claims for the following: clopidogrel (Plavix) , rosuvastatin (Crestor ) .  Copays and coinsurance results were relayed to Inpatient clinical team.

## 2022-05-15 NOTE — Care Management (Signed)
ED RNCM received a call from [place] on 5W  concerning patient being discharged home on home oxygen and [facility]  for medication management.  Discussed with patient he is agreeable.  Zack with [employer] was contacted home oxygen being arranged to be delivered tonight to patient's home. [facility] accepted the [facility] referral. Patient was made aware and he is agreeable;. Explained that [facility] will contact patient to arrange the initial visit.

## 2022-05-15 NOTE — TOC Transition Note (Signed)
Transition of Care [facility]) - CM/SW Discharge Note   Patient Details  Name: Mark Cannon MRN: [id number] Date of Birth: [birth year]  Transition of [facility]) CM/SW Contact:  Cyndi Bender, RN Phone Number: 05/15/2022, 4:51 PM   Clinical Narrative:     Patient stable for discharge. Medication benefit review with patient.  No other TOC needs        Patient Goals and CMS Choice        Discharge Placement                       Discharge Plan and Services                                     Social Determinants of Health (SDOH) Interventions     Readmission Risk Interventions     No data to display

## 2022-05-15 NOTE — Progress Notes (Signed)
  Echocardiogram 2D Echocardiogram has been performed.  Mark Cannon 05/15/2022, 11:55 AM

## 2022-05-15 NOTE — Evaluation (Signed)
Occupational Therapy Evaluation Patient Details Name: Mark Cannon MRN: [id number] DOB: [birth year] Today's Date: 05/15/2022   History of Present Illness 71 yo male presents [facility] on 8/1 with R eye blurred vision, central vision loss. MRI brain shows No acute intracranial abnormality. PMH includes HTN, HLD not tolerating statin, OSA not on CPAP, CKD stage II, cigar smoker, lumbar stenosis on narcotics, prostate cancer status post prostatectomy.   Clinical Impression   Pt is functioning at his baseline in ADLs and mobility despite vision changes in L eye. Pt appears to be compensating well for superior, central blurred vision in R eye. Pt is aware of s/s of stroke and importance of seeking medical attention quickly and in his risk factors. No further OT needs.      Recommendations for follow up therapy are one component of a multi-disciplinary discharge planning process, led by the attending physician.  Recommendations may be updated based on patient status, additional functional criteria and insurance authorization.   Follow Up Recommendations  No OT follow up    Assistance Recommended at Discharge PRN  Patient can return home with the following      Functional Status Assessment  Patient has had a recent decline in their functional status and demonstrates the ability to make significant improvements in function in a reasonable and predictable amount of time.  Equipment Recommendations  None recommended by OT    Recommendations for Other Services       Precautions / Restrictions Precautions Precautions: None Restrictions Weight Bearing Restrictions: No      Mobility Bed Mobility Overal bed mobility: Modified Independent                  Transfers Overall transfer level: Modified independent Equipment used: None               General transfer comment: use of momentum      Balance Overall balance assessment: Mild deficits observed, not formally tested                                          ADL either performed or assessed with clinical judgement   ADL Overall ADL's : Independent                                             Vision Baseline Vision/History: 1 Wears glasses Ability to See in Adequate Light: 1 Impaired Patient Visual Report: Other (comment) (blurred vision in superior central vision)       Perception     Praxis      Pertinent Vitals/Pain Pain Assessment Pain Assessment: No/denies pain     Hand Dominance Right   Extremity/Trunk Assessment Upper Extremity Assessment Upper Extremity Assessment: Overall WFL for tasks assessed   Lower Extremity Assessment Lower Extremity Assessment: Defer to PT evaluation   Cervical / Trunk Assessment Cervical / Trunk Assessment: Other exceptions (chronic back pain, obesity)   Communication Communication Communication: No difficulties   Cognition Arousal/Alertness: Awake/alert Behavior During Therapy: WFL for tasks assessed/performed Overall Cognitive Status: Within Functional Limits for tasks assessed  General Comments  R eye visual impairment: clouding in middle, decreased acuity above midline per pt report    Exercises     Shoulder Instructions      Home Living Family/patient expects to be discharged to:: Private residence Living Arrangements: Spouse/significant other Available Help at Discharge: Family;Available 24 hours/day Type of Home: House Home Access: Level entry     Home Layout: One level     Bathroom Shower/Tub: Occupational psychologist: Standard     Home Equipment: Shower seat          Prior Functioning/Environment Prior Level of Function : Independent/Modified Independent;Driving                        OT Problem List:        OT Treatment/Interventions:      OT Goals(Current goals can be found in the care plan section)    OT  Frequency:      Co-evaluation              AM-PAC OT "6 Clicks" Daily Activity     Outcome Measure Help from another person eating meals?: None Help from another person taking care of personal grooming?: None Help from another person toileting, which includes using toliet, bedpan, or urinal?: None Help from another person bathing (including washing, rinsing, drying)?: None Help from another person to put on and taking off regular upper body clothing?: None Help from another person to put on and taking off regular lower body clothing?: None 6 Click Score: 24   End of Session    Activity Tolerance: Patient tolerated treatment well Patient left: in bed;with call bell/phone within reach;with nursing/sitter in room  OT Visit Diagnosis: Other (comment) (impaired vision)                Time: 4825-0037 OT Time Calculation (min): 14 min Charges:  OT General Charges $OT Visit: 1 Visit OT Evaluation $OT Eval Low Complexity: 1 Low  Cleta Alberts, OTR/L Acute Rehabilitation Services Office: [phone]  [name] 05/15/2022, 2:01 PM

## 2022-05-15 NOTE — Discharge Summary (Signed)
Physician Discharge Summary  Mark Cannon:[id number] DOB: 1951/08/31 DOA: 05/14/2022  PCP: Jake Samples, PA-C  Admit date: 05/14/2022 Discharge date: 05/15/2022  Admitted From: Home Disposition: Home  Recommendations for Outpatient Follow-up:  Follow up with PCP in 1-2 weeks Follow-up with your ophthalmology  Home Health: RN Equipment/Devices: Oxygen, 2 L/min  Discharge Condition: Stable CODE STATUS: Full code Diet recommendation: Low-salt diet  Discharge summary: 71-year gentleman with history of hypertension, untreated sleep apnea, hyperlipidemia with a statin intolerance, obesity, prostate cancer status post prostate removal and radiation, 55-pack-year smoking history presented to the emergency room from his retinal specialist where he was seen for right retinal artery branch occlusion that has resulted in superior central visual field being cloudy.  MRI shows no acute intracranial abnormality, CTA shows some atherosclerotic plaque in the internal carotid arteries bilaterally.  LDL 128.  A1c 7.7.  Seen by neurology.  2D echocardiogram done, results are pending.  Stable with no change in vision.  Plan: No acute reversible cause found.  Neurology recommended aspirin and Plavix for 3 weeks then aspirin alone to continue as a dual antiplatelet therapy. He will continue atorvastatin. Blood pressures and blood sugars at goal. He does have a history of untreated sleep apnea causing restrictive lung disease and ambulatory hypoxemia.  He was recommended to wear ambulatory oxygen. He will follow-up with ophthalmology to continue care. Stable for discharge.    Discharge Diagnoses:  Principal Problem:   Stroke (cerebrum) [facility])    Discharge Instructions  Discharge Instructions     Diet - low sodium heart healthy   Complete by: As directed    Discharge instructions   Complete by: As directed    Schedule follow up with your eye doctor   Increase activity slowly   Complete  by: As directed       Allergies as of 05/15/2022       Reactions   Lidocaine Hives   Benzocaine Other (See Comments)   Blisters    Other    Novocaine - blisters in mouth        Medication List     TAKE these medications    amLODipine 10 MG tablet Commonly known as: NORVASC Take 10 mg by mouth daily.   aspirin EC 81 MG tablet Take 1 tablet (81 mg total) by mouth daily. Swallow whole. Start taking on: May 16, 2022   atorvastatin 20 MG tablet Commonly known as: LIPITOR Take 1 tablet (20 mg total) by mouth daily. Start taking on: May 16, 2022   clopidogrel 75 MG tablet Commonly known as: PLAVIX Take 1 tablet (75 mg total) by mouth daily for 21 days. Start taking on: May 16, 2022   hydrochlorothiazide 25 MG tablet Commonly known as: HYDRODIURIL Take 25 mg by mouth daily.   meloxicam 15 MG tablet Commonly known as: MOBIC Take 15 mg by mouth daily.   olmesartan 20 MG tablet Commonly known as: BENICAR Take 20 mg by mouth daily.               Durable Medical Equipment  (From admission, onward)           Start     Ordered   05/15/22 1633  For home use only DME oxygen  Once       Question Answer Comment  Length of Need Lifetime   Mode or (Route) Nasal cannula   Liters per Minute 2   Frequency Continuous (stationary and portable oxygen unit needed)   Oxygen  conserving device Yes   Oxygen delivery system Gas      05/15/22 1632            Allergies  Allergen Reactions   Lidocaine Hives   Benzocaine Other (See Comments)    Blisters    Other     Novocaine - blisters in mouth    Consultations: Neurology Ophthalmology   Procedures/Studies: MR BRAIN WO CONTRAST  Result Date: 05/14/2022 CLINICAL DATA:  Stroke follow-up EXAM: MRI HEAD WITHOUT CONTRAST TECHNIQUE: Multiplanar, multiecho pulse sequences of the brain and surrounding structures were obtained without intravenous contrast. COMPARISON:  01/17/2013 FINDINGS: Brain: No acute  infarct, mass effect or extra-axial collection. No acute or chronic hemorrhage. There is multifocal hyperintense T2-weighted signal within the white matter. Parenchymal volume and CSF spaces are normal. The midline structures are normal. Vascular: Major flow voids are preserved. Skull and upper cervical spine: Normal calvarium and skull base. Visualized upper cervical spine and soft tissues are normal. Sinuses/Orbits:No paranasal sinus fluid levels or advanced mucosal thickening. No mastoid or middle ear effusion. Normal orbits. IMPRESSION: 1. No acute intracranial abnormality. 2. Findings of mild chronic microvascular ischemia. Electronically Signed   By: Ulyses Jarred M.D.   On: 05/14/2022 22:45   CT ANGIO HEAD NECK W WO CM  Result Date: 05/14/2022 CLINICAL DATA:  Provided history: Neuro deficit, acute, stroke suspected. Additional history provided: Right eye blurred vision. EXAM: CT ANGIOGRAPHY HEAD AND NECK TECHNIQUE: Multidetector CT imaging of the head and neck was performed using the standard protocol during bolus administration of intravenous contrast. Multiplanar CT image reconstructions and MIPs were obtained to evaluate the vascular anatomy. Carotid stenosis measurements (when applicable) are obtained utilizing NASCET criteria, using the distal internal carotid diameter as the denominator. RADIATION DOSE REDUCTION: This exam was performed according to the departmental dose-optimization program which includes automated exposure control, adjustment of the mA and/or kV according to patient size and/or use of iterative reconstruction technique. CONTRAST:  6m OMNIPAQUE IOHEXOL 350 MG/ML SOLN COMPARISON:  Brain MRI 01/27/2013. FINDINGS: CT HEAD FINDINGS Brain: Mild generalized parenchymal atrophy. Minimal patchy and ill-defined hypoattenuation within the cerebral white matter, nonspecific but compatible with chronic small vessel ischemic disease. There is no acute intracranial hemorrhage. No demarcated  cortical infarct. No extra-axial fluid collection. No evidence of an intracranial mass. No midline shift. Vascular: No hyperdense vessel. Atherosclerotic calcifications. Skull: No fracture or aggressive osseous lesion. Sinuses/Orbits: No orbital mass or acute orbital finding. No significant paranasal sinus disease. Review of the MIP images confirms the above findings CTA NECK FINDINGS Aortic arch: Standard aortic branching. Atherosclerotic plaque within the visualized aortic arch and proximal major branch vessels of the neck. Streak and beam hardening artifact arising from a dense right-sided contrast bolus partially obscures the right subclavian artery. Within this limitation, there is no appreciable hemodynamically significant innominate or proximal subclavian artery stenosis. Right carotid system: CCA and ICA patent within the neck without stenosis. Atherosclerotic plaque. Most notably, there is predominantly soft plaque within the proximal ICA resulting in 50-60% stenosis. Left carotid system: CCA and ICA patent within the neck. Atherosclerotic plaque. Most notably, there is soft and calcified plaque about the carotid bifurcation and within the proximal ICA resulting in a 50-60% stenosis within the proximal ICA. Also of note, there is ulcerated plaque within the carotid bulb. Vertebral arteries: Vertebral arteries patent within the neck without stenosis. The right vertebral artery is dominant. Nonstenotic atherosclerotic plaque at the origin of the left vertebral artery. Skeleton: Straightening of the expected  cervical spondylosis. Slight C2-C3 grade 1 anterolisthesis. Cervical spondylosis. Prior C5-C7 ACDF. No acute fracture or aggressive osseous lesion. Other neck: No neck mass or cervical lymphadenopathy. Upper chest: No consolidation within the imaged lung apices. Review of the MIP images confirms the above findings CTA HEAD FINDINGS Anterior circulation: The intracranial internal carotid arteries are  patent. Atherosclerotic plaque within both vessels with no more than mild stenosis. Up to moderate stenosis within the cavernous segment on the right. Otherwise, no more than mild stenosis within the intracranial ICAs. The M1 middle cerebral arteries are patent. No M2 proximal branch occlusion or high-grade proximal stenosis. The anterior cerebral arteries are patent. 2-3 mm inferiorly projecting vascular protrusion arising from the paraclinoid ICA, which appears to reflect an infundibulum. Posterior circulation: The non dominant intracranial left vertebral artery is developmentally diminutive beyond the origin of the left PICA, but patent. The dominant right vertebral artery is patent intracranially without stenosis. The basilar artery is patent. The posterior cerebral arteries are patent. A left posterior communicating artery is present. The right posterior communicating artery is diminutive or absent Venous sinuses: Within the limitations of contrast timing, no convincing thrombus. Anatomic variants: As described. Review of the MIP images confirms the above findings IMPRESSION: CT head: 1. No evidence of acute intracranial abnormality. 2. Minimal chronic small-vessel ischemic changes within the cerebral white matter. 3. Mild generalized parenchymal atrophy. CTA neck: 1. The common carotid and internal carotid arteries are patent within the neck. Atherosclerotic plaque results in 50-60% stenosis within the proximal internal carotid arteries, bilaterally. Ulcerated plaque within the left carotid bulb. 2. Vertebral arteries patent within the neck without stenosis. 3. Aortic Atherosclerosis (ICD10-I70.0). CTA head: 1. No intracranial large vessel occlusion is identified. 2. Atherosclerotic plaque within the intracranial ICAs, bilaterally. Up to moderate stenosis within the cavernous segment on the right. Electronically Signed   By: Kellie Simmering D.O.   On: 05/14/2022 15:12   (Echo, Carotid, EGD, Colonoscopy, ERCP)     Subjective: Patient seen and examined.  No overnight events.  Eager to get out of the hospital. He feels at his baseline, his oxygen saturation 84% on walking around. Initially he refused to go on oxygen, however after describing benefits of keeping oxygen saturations more than 89%, he is agreeable to use oxygen.   Discharge Exam: Vitals:   05/15/22 0800 05/15/22 1218  BP: (!) 140/62 (!) 118/58  Pulse: 78 83  Resp: 14 17  Temp: 98.2 F (36.8 C) 98.6 F (37 C)  SpO2: (!) 84% 91%   Vitals:   05/15/22 0023 05/15/22 0400 05/15/22 0800 05/15/22 1218  BP: 125/66 125/60 (!) 140/62 (!) 118/58  Pulse: 76 84 78 83  Resp: '14 14 14 17  '$ Temp: 98.1 F (36.7 C) 97.9 F (36.6 C) 98.2 F (36.8 C) 98.6 F (37 C)  TempSrc: Oral Oral Oral Oral  SpO2: 90% 92% (!) 84% 91%  Weight:      Height:        Right central vision loss in the superior central visual field.    The results of significant diagnostics from this hospitalization (including imaging, microbiology, ancillary and laboratory) are listed below for reference.     Microbiology: No results found for this or any previous visit (from the past 240 hour(s)).   Labs: BNP (last 3 results) No results for input(s): "BNP" in the last 8760 hours. Basic Metabolic Panel: Recent Labs  Lab 05/14/22 1246 05/15/22 0444  NA 136 138  K 4.3 4.1  CL  99 100  CO2 27 30  GLUCOSE 120* 124*  BUN 20 17  CREATININE 1.46* 1.29*  CALCIUM 9.1 8.6*   Liver Function Tests: No results for input(s): "AST", "ALT", "ALKPHOS", "BILITOT", "PROT", "ALBUMIN" in the last 168 hours. No results for input(s): "LIPASE", "AMYLASE" in the last 168 hours. No results for input(s): "AMMONIA" in the last 168 hours. CBC: Recent Labs  Lab 05/14/22 1246  WBC 10.9*  NEUTROABS 7.9*  HGB 14.3  HCT 47.9  MCV 86.8  PLT 301   Cardiac Enzymes: No results for input(s): "CKTOTAL", "CKMB", "CKMBINDEX", "TROPONINI" in the last 168 hours. BNP: Invalid input(s):  "POCBNP" CBG: No results for input(s): "GLUCAP" in the last 168 hours. D-Dimer No results for input(s): "DDIMER" in the last 72 hours. Hgb A1c Recent Labs    05/14/22 1810  HGBA1C 7.7*   Lipid Profile Recent Labs    05/15/22 0444  CHOL 208*  HDL 26*  LDLCALC 128*  TRIG 268*  CHOLHDL 8.0   Thyroid function studies No results for input(s): "TSH", "T4TOTAL", "T3FREE", "THYROIDAB" in the last 72 hours.  Invalid input(s): "FREET3" Anemia work up No results for input(s): "VITAMINB12", "FOLATE", "FERRITIN", "TIBC", "IRON", "RETICCTPCT" in the last 72 hours. Urinalysis    Component Value Date/Time   COLORURINE YELLOW 12/01/2016 1022   APPEARANCEUR CLEAR 12/01/2016 1022   LABSPEC 1.018 12/01/2016 1022   PHURINE 6.0 12/01/2016 1022   GLUCOSEU NEGATIVE 12/01/2016 1022   HGBUR NEGATIVE 12/01/2016 [place] 12/01/2016 [place] 12/01/2016 1022   PROTEINUR NEGATIVE 12/01/2016 1022   NITRITE NEGATIVE 12/01/2016 1022   LEUKOCYTESUR NEGATIVE 12/01/2016 1022   Sepsis Labs Recent Labs  Lab 05/14/22 1246  WBC 10.9*   Microbiology No results found for this or any previous visit (from the past 240 hour(s)).   Time coordinating discharge: 25 minutes  SIGNED:   Barb Merino, MD  [facility] 05/15/2022, 4:32 PM

## 2022-05-15 NOTE — Progress Notes (Addendum)
STROKE TEAM PROGRESS NOTE   INTERVAL HISTORY Seen in room sitting up on the side of the bed.  He states that on Wednesday he woke up with right central visual impairment mostly in the upper half and the next day he went to a retina specialist that he was referred to from his optometrist.  He describes it as a constant right central and upper (just above midline) visual field impairment.  He was diagnosed with a retinal artery branch occlusion of the right eye and sent to the ED for a stroke work-up. MRI scan brain is negative for acute infarct and CT angiogram shows atheromatous plaque with 50 to 60% stenosis in the proximal ICA bilaterally. Vitals:   05/14/22 2152 05/14/22 2153 05/15/22 0023 05/15/22 0400  BP:   125/66 125/60  Pulse:   76 84  Resp:   14 14  Temp:   98.1 F (36.7 C) 97.9 F (36.6 C)  TempSrc:   Oral Oral  SpO2: 93% 92% 90% 92%  Weight:      Height:       CBC:  Recent Labs  Lab 05/14/22 1246  WBC 10.9*  NEUTROABS 7.9*  HGB 14.3  HCT 47.9  MCV 86.8  PLT 485   Basic Metabolic Panel:  Recent Labs  Lab 05/14/22 1246 05/15/22 0444  NA 136 138  K 4.3 4.1  CL 99 100  CO2 27 30  GLUCOSE 120* 124*  BUN 20 17  CREATININE 1.46* 1.29*  CALCIUM 9.1 8.6*   Lipid Panel:  Recent Labs  Lab 05/15/22 0444  CHOL 208*  TRIG 268*  HDL 26*  CHOLHDL 8.0  VLDL 54*  LDLCALC 128*   HgbA1c:  Recent Labs  Lab 05/14/22 1810  HGBA1C 7.7*   Urine Drug Screen: No results for input(s): "LABOPIA", "COCAINSCRNUR", "LABBENZ", "AMPHETMU", "THCU", "LABBARB" in the last 168 hours.  Alcohol Level No results for input(s): "ETH" in the last 168 hours.  IMAGING past 24 hours MR BRAIN WO CONTRAST  Result Date: 05/14/2022 CLINICAL DATA:  Stroke follow-up EXAM: MRI HEAD WITHOUT CONTRAST TECHNIQUE: Multiplanar, multiecho pulse sequences of the brain and surrounding structures were obtained without intravenous contrast. COMPARISON:  01/17/2013 FINDINGS: Brain: No acute infarct, mass  effect or extra-axial collection. No acute or chronic hemorrhage. There is multifocal hyperintense T2-weighted signal within the white matter. Parenchymal volume and CSF spaces are normal. The midline structures are normal. Vascular: Major flow voids are preserved. Skull and upper cervical spine: Normal calvarium and skull base. Visualized upper cervical spine and soft tissues are normal. Sinuses/Orbits:No paranasal sinus fluid levels or advanced mucosal thickening. No mastoid or middle ear effusion. Normal orbits. IMPRESSION: 1. No acute intracranial abnormality. 2. Findings of mild chronic microvascular ischemia. Electronically Signed   By: Ulyses Jarred M.D.   On: 05/14/2022 22:45   CT ANGIO HEAD NECK W WO CM  Result Date: 05/14/2022 CLINICAL DATA:  Provided history: Neuro deficit, acute, stroke suspected. Additional history provided: Right eye blurred vision. EXAM: CT ANGIOGRAPHY HEAD AND NECK TECHNIQUE: Multidetector CT imaging of the head and neck was performed using the standard protocol during bolus administration of intravenous contrast. Multiplanar CT image reconstructions and MIPs were obtained to evaluate the vascular anatomy. Carotid stenosis measurements (when applicable) are obtained utilizing NASCET criteria, using the distal internal carotid diameter as the denominator. RADIATION DOSE REDUCTION: This exam was performed according to the departmental dose-optimization program which includes automated exposure control, adjustment of the mA and/or kV according to patient size  and/or use of iterative reconstruction technique. CONTRAST:  60m OMNIPAQUE IOHEXOL 350 MG/ML SOLN COMPARISON:  Brain MRI 01/27/2013. FINDINGS: CT HEAD FINDINGS Brain: Mild generalized parenchymal atrophy. Minimal patchy and ill-defined hypoattenuation within the cerebral white matter, nonspecific but compatible with chronic small vessel ischemic disease. There is no acute intracranial hemorrhage. No demarcated cortical infarct.  No extra-axial fluid collection. No evidence of an intracranial mass. No midline shift. Vascular: No hyperdense vessel. Atherosclerotic calcifications. Skull: No fracture or aggressive osseous lesion. Sinuses/Orbits: No orbital mass or acute orbital finding. No significant paranasal sinus disease. Review of the MIP images confirms the above findings CTA NECK FINDINGS Aortic arch: Standard aortic branching. Atherosclerotic plaque within the visualized aortic arch and proximal major branch vessels of the neck. Streak and beam hardening artifact arising from a dense right-sided contrast bolus partially obscures the right subclavian artery. Within this limitation, there is no appreciable hemodynamically significant innominate or proximal subclavian artery stenosis. Right carotid system: CCA and ICA patent within the neck without stenosis. Atherosclerotic plaque. Most notably, there is predominantly soft plaque within the proximal ICA resulting in 50-60% stenosis. Left carotid system: CCA and ICA patent within the neck. Atherosclerotic plaque. Most notably, there is soft and calcified plaque about the carotid bifurcation and within the proximal ICA resulting in a 50-60% stenosis within the proximal ICA. Also of note, there is ulcerated plaque within the carotid bulb. Vertebral arteries: Vertebral arteries patent within the neck without stenosis. The right vertebral artery is dominant. Nonstenotic atherosclerotic plaque at the origin of the left vertebral artery. Skeleton: Straightening of the expected cervical spondylosis. Slight C2-C3 grade 1 anterolisthesis. Cervical spondylosis. Prior C5-C7 ACDF. No acute fracture or aggressive osseous lesion. Other neck: No neck mass or cervical lymphadenopathy. Upper chest: No consolidation within the imaged lung apices. Review of the MIP images confirms the above findings CTA HEAD FINDINGS Anterior circulation: The intracranial internal carotid arteries are patent. Atherosclerotic  plaque within both vessels with no more than mild stenosis. Up to moderate stenosis within the cavernous segment on the right. Otherwise, no more than mild stenosis within the intracranial ICAs. The M1 middle cerebral arteries are patent. No M2 proximal branch occlusion or high-grade proximal stenosis. The anterior cerebral arteries are patent. 2-3 mm inferiorly projecting vascular protrusion arising from the paraclinoid ICA, which appears to reflect an infundibulum. Posterior circulation: The non dominant intracranial left vertebral artery is developmentally diminutive beyond the origin of the left PICA, but patent. The dominant right vertebral artery is patent intracranially without stenosis. The basilar artery is patent. The posterior cerebral arteries are patent. A left posterior communicating artery is present. The right posterior communicating artery is diminutive or absent Venous sinuses: Within the limitations of contrast timing, no convincing thrombus. Anatomic variants: As described. Review of the MIP images confirms the above findings IMPRESSION: CT head: 1. No evidence of acute intracranial abnormality. 2. Minimal chronic small-vessel ischemic changes within the cerebral white matter. 3. Mild generalized parenchymal atrophy. CTA neck: 1. The common carotid and internal carotid arteries are patent within the neck. Atherosclerotic plaque results in 50-60% stenosis within the proximal internal carotid arteries, bilaterally. Ulcerated plaque within the left carotid bulb. 2. Vertebral arteries patent within the neck without stenosis. 3. Aortic Atherosclerosis (ICD10-I70.0). CTA head: 1. No intracranial large vessel occlusion is identified. 2. Atherosclerotic plaque within the intracranial ICAs, bilaterally. Up to moderate stenosis within the cavernous segment on the right. Electronically Signed   By: KKellie SimmeringD.O.   On: 05/14/2022 15:12  PHYSICAL EXAM  Physical Exam  Constitutional: Appears  well-developed and well-nourished.   Cardiovascular: Normal rate and regular rhythm.  Respiratory: Effort normal, non-labored breathing  Neuro: Mental Status: Patient is awake, alert, oriented to person, place, month, year, and situation. Patient is able to give a clear and coherent history. No signs of aphasia or neglect Cranial Nerves: II: Visual Fields are full. Pupils are equal, round, and reactive to light. Right eye central vision loss in the superior central visual field that is a cloudy tan/gray.  Inferior peripheral visual field intact. He is able to count fingers throughout the inferior visual field. III,IV, VI: EOMI without ptosis or diploplia.  V: Facial sensation is symmetric to temperature VII: Facial movement is symmetric resting and smiling VIII: Hearing is intact to voice X: Palate elevates symmetrically XI: Shoulder shrug is symmetric. XII: Tongue protrudes midline without atrophy or fasciculations.  Motor: Tone is normal. Bulk is normal. 5/5 strength was present in all four extremities.  Sensory: Sensation is symmetric to light touch and temperature in the arms and legs. No extinction to DSS present.  Cerebellar: FNF and HKS are intact bilaterally  ASSESSMENT/PLAN Mr. AVIGDOR DOLLAR is a 71 y.o. male with history of medical history significant for hypertension, OSA not on CPAP, hyperlipidemia with statin intolerance, obesity, CKD stage II, prostate cancer status post prostate removal and radiation, and 55-pack-year smoking history who presented to the ED 8/1 from his retina specialist.  Retina specialist diagnosed him with a right retinal artery branch occlusion that has resulted in the superior central visual field that appears tan/gray and cloudy. MRI shows no acute intracranial abnormality and CTA shows some atherosclerotic plaque in the internal carotid arteries bilaterally.  Recommend DAPT with aspirin and Plavix for 3 weeks and then aspirin 81 mg for monotherapy.    Right BRAO likely atheroembolic from moderate proximal carotid stenosis Code Stroke CT head No acute abnormality.  CTA head & neck - Atherosclerotic plaque results in 50-60% stenosis within the proximal internal carotid arteries, bilaterally MRI no acute intracranial abnormality 2D Echo results pending LDL 128 HgbA1c 7.7 VTE prophylaxis -Lovenox    Diet   Diet Heart Room service appropriate? Yes; Fluid consistency: Thin   No antithrombotic prior to admission, now on aspirin 81 mg daily and clopidogrel 75 mg daily 3 weeks and then ASA '81mg'$  for life Therapy recommendations: No follow-up recommended Disposition: Pending  Hypertension Home meds:  Norvasc, olmestartan Stable Permissive hypertension (OK if < 220/120) but gradually normalize in 5-7 days Long-term BP goal normotensive  Hyperlipidemia Home meds:  None, resumed in hospital LDL 128, goal < 70 Continue statin at discharge  Diabetes type II Uncontrolled Home meds:  None HgbA1c 7.7, goal < 7.0 CBGs No results for input(s): "GLUCAP" in the last 72 hours.  SSI  Other Stroke Risk Factors Advanced Age >/= 45  Cigarette smoker, advised to stop smoking Nicotine patch ordered Obesity, Body mass index is 35.28 kg/m., BMI >/= 30 associated with increased stroke risk, recommend weight loss, diet and exercise as appropriate  Obstructive sleep apnea, not on CPAP at home Outpatient chest CT shows centrilobular emphysema and he has been referred to pulmonary per primary team  Other Active Problems CKD stage II Gentle hydration post contrast Cr 1.46 -> 1.29  Hospital day # 0  Patient seen and examined by NP/APP with MD. MD to update note as needed.   Janine Ores, DNP, FNP-BC [facility] Pager: [phone]   STROKE MD NOTE  I have personally obtained history,examined this patient, reviewed notes, independently viewed imaging studies, participated in medical decision making and plan of care.ROS  completed by me personally and pertinent positives fully documented  I have made any additions or clarifications directly to the above note. Agree with note above.  He presented with sudden onset of painless vision loss in the upper portion of the right eye and saw ophthalmologist who confirmed retinal artery branch occlusion.  Brain imaging was unremarkable and CT angiogram shows bilateral 50 to 60% carotid stenosis with atheromatous plaques.  He is not presently a candidate for carotid revascularization as the stenosis is below 70%.  Recommend dual antiplatelet therapy of aspirin and Plavix for 3 weeks followed by aspirin alone and aggressive risk factor modification.  Statin for elevated lipids.  Patient was discharged home after echocardiogram is resulted follow-up as an outpatient with stroke clinic in 2 months.  Continue outpatient follow-up with ophthalmology.  Greater than 50% time during this 50-minute visit was spent in counseling and coordination of care about his vision loss and retinal artery branch occlusion and carotid stenosis and answered questions about stroke prevention and treatment.  Antony Contras, MD Medical Director [facility] Pager: [phone] 05/15/2022 3:54 PM  To contact Stroke Continuity provider, please refer to [email or web address] After hours, contact General Neurology

## 2022-05-16 DIAGNOSIS — Z0001 Encounter for general adult medical examination with abnormal findings: Secondary | ICD-10-CM | POA: Diagnosis not present

## 2022-05-16 DIAGNOSIS — J449 Chronic obstructive pulmonary disease, unspecified: Secondary | ICD-10-CM | POA: Diagnosis not present

## 2022-05-16 DIAGNOSIS — Z1331 Encounter for screening for depression: Secondary | ICD-10-CM | POA: Diagnosis not present

## 2022-05-16 DIAGNOSIS — I10 Essential (primary) hypertension: Secondary | ICD-10-CM | POA: Diagnosis not present

## 2022-05-16 DIAGNOSIS — H539 Unspecified visual disturbance: Secondary | ICD-10-CM | POA: Diagnosis not present

## 2022-05-16 DIAGNOSIS — M5030 Other cervical disc degeneration, unspecified cervical region: Secondary | ICD-10-CM | POA: Diagnosis not present

## 2022-05-16 DIAGNOSIS — Z6836 Body mass index (BMI) 36.0-36.9, adult: Secondary | ICD-10-CM | POA: Diagnosis not present

## 2022-05-24 DIAGNOSIS — C61 Malignant neoplasm of prostate: Secondary | ICD-10-CM | POA: Diagnosis not present

## 2022-05-24 DIAGNOSIS — Z5111 Encounter for antineoplastic chemotherapy: Secondary | ICD-10-CM | POA: Diagnosis not present

## 2022-05-31 DIAGNOSIS — H34231 Retinal artery branch occlusion, right eye: Secondary | ICD-10-CM | POA: Diagnosis not present

## 2022-05-31 DIAGNOSIS — H35033 Hypertensive retinopathy, bilateral: Secondary | ICD-10-CM | POA: Diagnosis not present

## 2022-05-31 DIAGNOSIS — D3132 Benign neoplasm of left choroid: Secondary | ICD-10-CM | POA: Diagnosis not present

## 2022-05-31 DIAGNOSIS — H43823 Vitreomacular adhesion, bilateral: Secondary | ICD-10-CM | POA: Diagnosis not present

## 2022-06-04 ENCOUNTER — Ambulatory Visit: Payer: Medicare Other | Admitting: Urology

## 2022-06-05 MED ORDER — MONTELUKAST SODIUM 10 MG PO TABS
ORAL_TABLET | 3 refills | Status: AC
Start: 2022-06-05 — End: ?

## 2022-06-27 ENCOUNTER — Encounter (HOSPITAL_COMMUNITY): Payer: Self-pay | Admitting: Emergency Medicine

## 2022-06-27 ENCOUNTER — Inpatient Hospital Stay (HOSPITAL_COMMUNITY)
Admission: EM | Admit: 2022-06-27 | Discharge: 2022-06-30 | DRG: 193 | Disposition: A | Discharge status: Home / Self Care | Payer: Medicare Other | Attending: Family Medicine | Admitting: Family Medicine

## 2022-06-27 ENCOUNTER — Emergency Department (HOSPITAL_COMMUNITY): Payer: Medicare Other

## 2022-06-27 ENCOUNTER — Other Ambulatory Visit: Payer: Self-pay

## 2022-06-27 DIAGNOSIS — Z79899 Other long term (current) drug therapy: Secondary | ICD-10-CM

## 2022-06-27 DIAGNOSIS — Z791 Long term (current) use of non-steroidal anti-inflammatories (NSAID): Secondary | ICD-10-CM

## 2022-06-27 DIAGNOSIS — Z8673 Personal history of transient ischemic attack (TIA), and cerebral infarction without residual deficits: Secondary | ICD-10-CM

## 2022-06-27 DIAGNOSIS — F17210 Nicotine dependence, cigarettes, uncomplicated: Secondary | ICD-10-CM | POA: Diagnosis present

## 2022-06-27 DIAGNOSIS — Z20822 Contact with and (suspected) exposure to covid-19: Secondary | ICD-10-CM | POA: Diagnosis present

## 2022-06-27 DIAGNOSIS — Z8719 Personal history of other diseases of the digestive system: Secondary | ICD-10-CM

## 2022-06-27 DIAGNOSIS — Z9049 Acquired absence of other specified parts of digestive tract: Secondary | ICD-10-CM

## 2022-06-27 DIAGNOSIS — I959 Hypotension, unspecified: Secondary | ICD-10-CM | POA: Diagnosis not present

## 2022-06-27 DIAGNOSIS — Z87442 Personal history of urinary calculi: Secondary | ICD-10-CM

## 2022-06-27 DIAGNOSIS — J189 Pneumonia, unspecified organism: Principal | ICD-10-CM | POA: Diagnosis present

## 2022-06-27 DIAGNOSIS — Z9079 Acquired absence of other genital organ(s): Secondary | ICD-10-CM | POA: Diagnosis not present

## 2022-06-27 DIAGNOSIS — R7303 Prediabetes: Secondary | ICD-10-CM | POA: Diagnosis present

## 2022-06-27 DIAGNOSIS — E669 Obesity, unspecified: Secondary | ICD-10-CM | POA: Diagnosis present

## 2022-06-27 DIAGNOSIS — Z8546 Personal history of malignant neoplasm of prostate: Secondary | ICD-10-CM | POA: Diagnosis present

## 2022-06-27 DIAGNOSIS — J9601 Acute respiratory failure with hypoxia: Secondary | ICD-10-CM | POA: Diagnosis not present

## 2022-06-27 DIAGNOSIS — Z6835 Body mass index (BMI) 35.0-35.9, adult: Secondary | ICD-10-CM | POA: Diagnosis not present

## 2022-06-27 DIAGNOSIS — J440 Chronic obstructive pulmonary disease with acute lower respiratory infection: Secondary | ICD-10-CM | POA: Diagnosis present

## 2022-06-27 DIAGNOSIS — N179 Acute kidney failure, unspecified: Secondary | ICD-10-CM | POA: Diagnosis present

## 2022-06-27 DIAGNOSIS — Z7982 Long term (current) use of aspirin: Secondary | ICD-10-CM

## 2022-06-27 DIAGNOSIS — C61 Malignant neoplasm of prostate: Secondary | ICD-10-CM | POA: Diagnosis present

## 2022-06-27 DIAGNOSIS — J441 Chronic obstructive pulmonary disease with (acute) exacerbation: Secondary | ICD-10-CM | POA: Diagnosis present

## 2022-06-27 DIAGNOSIS — H5461 Unqualified visual loss, right eye, normal vision left eye: Secondary | ICD-10-CM | POA: Diagnosis present

## 2022-06-27 DIAGNOSIS — I639 Cerebral infarction, unspecified: Secondary | ICD-10-CM | POA: Diagnosis present

## 2022-06-27 DIAGNOSIS — Z8249 Family history of ischemic heart disease and other diseases of the circulatory system: Secondary | ICD-10-CM

## 2022-06-27 DIAGNOSIS — I129 Hypertensive chronic kidney disease with stage 1 through stage 4 chronic kidney disease, or unspecified chronic kidney disease: Secondary | ICD-10-CM | POA: Diagnosis present

## 2022-06-27 DIAGNOSIS — J168 Pneumonia due to other specified infectious organisms: Secondary | ICD-10-CM | POA: Diagnosis not present

## 2022-06-27 DIAGNOSIS — K219 Gastro-esophageal reflux disease without esophagitis: Secondary | ICD-10-CM | POA: Diagnosis present

## 2022-06-27 DIAGNOSIS — M1990 Unspecified osteoarthritis, unspecified site: Secondary | ICD-10-CM | POA: Diagnosis not present

## 2022-06-27 DIAGNOSIS — N1831 Chronic kidney disease, stage 3a: Secondary | ICD-10-CM | POA: Diagnosis present

## 2022-06-27 DIAGNOSIS — E7800 Pure hypercholesterolemia, unspecified: Secondary | ICD-10-CM | POA: Diagnosis present

## 2022-06-27 DIAGNOSIS — Z9852 Vasectomy status: Secondary | ICD-10-CM

## 2022-06-27 DIAGNOSIS — Z823 Family history of stroke: Secondary | ICD-10-CM

## 2022-06-27 DIAGNOSIS — Z884 Allergy status to anesthetic agent status: Secondary | ICD-10-CM

## 2022-06-27 DIAGNOSIS — Z79891 Long term (current) use of opiate analgesic: Secondary | ICD-10-CM

## 2022-06-27 DIAGNOSIS — G4733 Obstructive sleep apnea (adult) (pediatric): Secondary | ICD-10-CM | POA: Diagnosis present

## 2022-06-27 DIAGNOSIS — R778 Other specified abnormalities of plasma proteins: Secondary | ICD-10-CM | POA: Diagnosis present

## 2022-06-27 DIAGNOSIS — M549 Dorsalgia, unspecified: Secondary | ICD-10-CM | POA: Diagnosis present

## 2022-06-27 DIAGNOSIS — I69398 Other sequelae of cerebral infarction: Secondary | ICD-10-CM

## 2022-06-27 DIAGNOSIS — R0602 Shortness of breath: Secondary | ICD-10-CM | POA: Diagnosis not present

## 2022-06-27 LAB — CREATININE, SERUM
Creatinine, Ser: 1.72 mg/dL — ABNORMAL HIGH (ref 0.61–1.24)
GFR, Estimated: 42 mL/min — ABNORMAL LOW (ref >60)

## 2022-06-27 LAB — BASIC METABOLIC PANEL
Anion gap: 7 (ref 5–15)
BUN: 20 mg/dL (ref 8–23)
CO2: 36 mmol/L — ABNORMAL HIGH (ref 22–32)
Calcium: 8.8 mg/dL — ABNORMAL LOW (ref 8.9–10.3)
Chloride: 94 mmol/L — ABNORMAL LOW (ref 98–111)
Creatinine, Ser: 1.5 mg/dL — ABNORMAL HIGH (ref 0.61–1.24)
GFR, Estimated: 49 mL/min — ABNORMAL LOW (ref >60)
Glucose, Bld: 145 mg/dL — ABNORMAL HIGH (ref 70–99)
Potassium: 4 mmol/L (ref 3.5–5.1)
Sodium: 137 mmol/L (ref 135–145)

## 2022-06-27 LAB — CBC WITH DIFFERENTIAL/PLATELET
Abs Immature Granulocytes: 0.06 10*3/uL (ref 0.00–0.07)
Basophils Absolute: 0.1 10*3/uL (ref 0.0–0.1)
Basophils Relative: 1 %
Eosinophils Absolute: 0.2 10*3/uL (ref 0.0–0.5)
Eosinophils Relative: 2 %
HCT: 37.9 % — ABNORMAL LOW (ref 39.0–52.0)
Hemoglobin: 11 g/dL — ABNORMAL LOW (ref 13.0–17.0)
Immature Granulocytes: 1 %
Lymphocytes Relative: 13 %
Lymphs Abs: 1.5 10*3/uL (ref 0.7–4.0)
MCH: 25.6 pg — ABNORMAL LOW (ref 26.0–34.0)
MCHC: 29 g/dL — ABNORMAL LOW (ref 30.0–36.0)
MCV: 88.3 fL (ref 80.0–100.0)
Monocytes Absolute: 0.8 10*3/uL (ref 0.1–1.0)
Monocytes Relative: 7 %
Neutro Abs: 8.5 10*3/uL — ABNORMAL HIGH (ref 1.7–7.7)
Neutrophils Relative %: 76 %
Platelets: 303 10*3/uL (ref 150–400)
RBC: 4.29 MIL/uL (ref 4.22–5.81)
RDW: 15.9 % — ABNORMAL HIGH (ref 11.5–15.5)
WBC: 11.1 10*3/uL — ABNORMAL HIGH (ref 4.0–10.5)
nRBC: 0 % (ref 0.0–0.2)

## 2022-06-27 LAB — CBC
HCT: 37.5 % — ABNORMAL LOW (ref 39.0–52.0)
Hemoglobin: 11.1 g/dL — ABNORMAL LOW (ref 13.0–17.0)
MCH: 26 pg (ref 26.0–34.0)
MCHC: 29.6 g/dL — ABNORMAL LOW (ref 30.0–36.0)
MCV: 87.8 fL (ref 80.0–100.0)
Platelets: 309 10*3/uL (ref 150–400)
RBC: 4.27 MIL/uL (ref 4.22–5.81)
RDW: 15.9 % — ABNORMAL HIGH (ref 11.5–15.5)
WBC: 11.4 10*3/uL — ABNORMAL HIGH (ref 4.0–10.5)
nRBC: 0.3 % — ABNORMAL HIGH (ref 0.0–0.2)

## 2022-06-27 LAB — RESP PANEL BY RT-PCR (FLU A&B, COVID) ARPGX2
Influenza A by PCR: NEGATIVE
Influenza B by PCR: NEGATIVE
SARS Coronavirus 2 by RT PCR: NEGATIVE

## 2022-06-27 LAB — TROPONIN I (HIGH SENSITIVITY)
Troponin I (High Sensitivity): 41 ng/L — ABNORMAL HIGH (ref <18)
Troponin I (High Sensitivity): 47 ng/L — ABNORMAL HIGH (ref <18)

## 2022-06-27 MED ORDER — AMLODIPINE BESYLATE 5 MG PO TABS
10.0000 mg | ORAL_TABLET | Freq: Every day | ORAL | Status: DC
  Administered 2022-06-28: 10 mg via ORAL
  Filled 2022-06-27 (×2): qty 2

## 2022-06-27 MED ORDER — SODIUM CHLORIDE 0.9 % IV SOLN
500.0000 mg | INTRAVENOUS | Status: DC
  Administered 2022-06-28 – 2022-06-30 (×3): 500 mg via INTRAVENOUS
  Filled 2022-06-27 (×3): qty 5

## 2022-06-27 MED ORDER — ASPIRIN 81 MG PO TBEC
81.0000 mg | DELAYED_RELEASE_TABLET | Freq: Every day | ORAL | Status: DC
  Administered 2022-06-28 – 2022-06-30 (×3): 81 mg via ORAL
  Filled 2022-06-27 (×3): qty 1

## 2022-06-27 MED ORDER — HEPARIN SODIUM (PORCINE) 5000 UNIT/ML IJ SOLN
5000.0000 [IU] | Freq: Three times a day (TID) | INTRAMUSCULAR | Status: DC
  Administered 2022-06-27 – 2022-06-30 (×8): 5000 [IU] via SUBCUTANEOUS
  Filled 2022-06-27 (×9): qty 1

## 2022-06-27 MED ORDER — PREDNISONE 50 MG PO TABS
60.0000 mg | ORAL_TABLET | Freq: Once | ORAL | Status: AC
  Administered 2022-06-27: 60 mg via ORAL
  Filled 2022-06-27: qty 1

## 2022-06-27 MED ORDER — ALBUTEROL SULFATE (2.5 MG/3ML) 0.083% IN NEBU: 2.5000 mg | INHALATION_SOLUTION | RESPIRATORY_TRACT | Status: DC | PRN

## 2022-06-27 MED ORDER — SODIUM CHLORIDE 0.9 % IV SOLN
500.0000 mg | INTRAVENOUS | Status: DC
  Filled 2022-06-27: qty 5

## 2022-06-27 MED ORDER — ONDANSETRON HCL 4 MG/2ML IJ SOLN: 4.0000 mg | Freq: Four times a day (QID) | INTRAMUSCULAR | Status: DC | PRN

## 2022-06-27 MED ORDER — HYDROCHLOROTHIAZIDE 25 MG PO TABS
25.0000 mg | ORAL_TABLET | Freq: Every day | ORAL | Status: DC
  Filled 2022-06-27: qty 1

## 2022-06-27 MED ORDER — SODIUM CHLORIDE 0.9 % IV SOLN
500.0000 mg | Freq: Once | INTRAVENOUS | Status: AC
  Administered 2022-06-27: 500 mg via INTRAVENOUS

## 2022-06-27 MED ORDER — SODIUM CHLORIDE 0.9 % IV SOLN
2.0000 g | INTRAVENOUS | Status: DC
  Administered 2022-06-28 – 2022-06-30 (×3): 2 g via INTRAVENOUS
  Filled 2022-06-27 (×3): qty 20

## 2022-06-27 MED ORDER — SODIUM CHLORIDE 0.9 % IV SOLN
1.0000 g | INTRAVENOUS | Status: AC
  Administered 2022-06-27: 1 g via INTRAVENOUS
  Filled 2022-06-27: qty 10

## 2022-06-27 MED ORDER — ACETAMINOPHEN 650 MG RE SUPP: 650.0000 mg | Freq: Four times a day (QID) | RECTAL | Status: DC | PRN

## 2022-06-27 MED ORDER — ACETAMINOPHEN 325 MG PO TABS: 650.0000 mg | ORAL_TABLET | Freq: Four times a day (QID) | ORAL | Status: DC | PRN

## 2022-06-27 MED ORDER — PANTOPRAZOLE SODIUM 40 MG PO TBEC
40.0000 mg | DELAYED_RELEASE_TABLET | Freq: Every day | ORAL | Status: DC
  Administered 2022-06-27 – 2022-06-30 (×3): 40 mg via ORAL
  Filled 2022-06-27 (×4): qty 1

## 2022-06-27 MED ORDER — IRBESARTAN 75 MG PO TABS
37.5000 mg | ORAL_TABLET | Freq: Every day | ORAL | Status: DC
  Filled 2022-06-27 (×2): qty 1

## 2022-06-27 MED ORDER — NICOTINE 21 MG/24HR TD PT24
21.0000 mg | MEDICATED_PATCH | Freq: Every day | TRANSDERMAL | Status: DC
  Administered 2022-06-27 – 2022-06-30 (×4): 21 mg via TRANSDERMAL
  Filled 2022-06-27 (×4): qty 1

## 2022-06-27 MED ORDER — IPRATROPIUM-ALBUTEROL 0.5-2.5 (3) MG/3ML IN SOLN
3.0000 mL | Freq: Four times a day (QID) | RESPIRATORY_TRACT | Status: DC
  Administered 2022-06-27 – 2022-06-28 (×5): 3 mL via RESPIRATORY_TRACT
  Filled 2022-06-27 (×5): qty 3

## 2022-06-27 MED ORDER — PANTOPRAZOLE SODIUM 20 MG PO TBEC
20.0000 mg | DELAYED_RELEASE_TABLET | Freq: Every day | ORAL | Status: DC
  Filled 2022-06-27 (×2): qty 1

## 2022-06-27 MED ORDER — ONDANSETRON HCL 4 MG PO TABS: 4.0000 mg | ORAL_TABLET | Freq: Four times a day (QID) | ORAL | Status: DC | PRN

## 2022-06-27 MED ORDER — IPRATROPIUM-ALBUTEROL 0.5-2.5 (3) MG/3ML IN SOLN
3.0000 mL | RESPIRATORY_TRACT | Status: AC
  Administered 2022-06-27 (×2): 3 mL via RESPIRATORY_TRACT
  Filled 2022-06-27: qty 6

## 2022-06-27 MED ORDER — PREDNISONE 20 MG PO TABS
40.0000 mg | ORAL_TABLET | Freq: Every day | ORAL | Status: DC
  Administered 2022-06-28 – 2022-06-30 (×3): 40 mg via ORAL
  Filled 2022-06-27 (×3): qty 2

## 2022-06-27 NOTE — ED Triage Notes (Signed)
Pt presents from PCP for SOB, pt hypoxia in triage 82% on room air.

## 2022-06-27 NOTE — H&P (Signed)
TRH H&P   Patient Demographics:    Mark Cannon, is a 71 y.o. male  MRN: [id number]   DOB - 08/29/1951  Admit Date - 06/27/2022  Outpatient Primary MD for the patient is Jake Samples, PA-C  Referring MD/NP/PA: Dr Sharlett Iles  Patient coming from: Home  Chief Complaint  Patient presents with   Shortness of Breath      HPI:    Mark Cannon  is a 71 y.o. male, with medical history significant of HTN, HLD not tolerating statin, OSA not on CPAP, CKD stage 3A , smoker, lumbar stenosis on narcotics, prostate cancer status post prostatectomy, with recent admission due to right eye vision loss/CVA from atherosclerosis. -Patient presents with complaints of shortness of breath, cough, patient reports symptoms going on for last few days, coughing up more tan phlegm, denies any hemoptysis, he denies any fever or chills, denies any chest pain, report oxygen saturation in the 60s once checked by his wife, he reports some wheezing at home as well, reports he is still smoking, but did cut down significantly on his cigarette consumption,. -In ED patient was 85% on room air, currently requiring 2 to 3 L nasal cannula, chest x-ray significant for left lingular opacity, started on steroids, IV Rocephin and azithromycin, reports improvement of his symptoms, troponins mildly elevated at 41, but he denies any chest pain, [facility] hospitalist consulted to admit.   Review of systems:    A full 10 point Review of Systems was done, except as stated above, all other Review of Systems were negative.   With Past History of the following :    Past Medical History:  Diagnosis Date   Arthritis    Cancer [facility])    Prostate, has had prostate removed, has had radiation and still has cancer,   Colon polyps    Cough    GERD (gastroesophageal reflux disease)    occasional    H/O tooth extraction week of  03/15/2015    History of kidney stones    Hypercholesteremia    Hypertension    Lumbar stenosis with neurogenic claudication    Pneumonia    Pre-diabetes    Renal disorder    Sleep apnea    No cpap      Past Surgical History:  Procedure Laterality Date   APPENDECTOMY     BACK SURGERY     CHOLECYSTECTOMY     COLONOSCOPY  02/04/2012   Procedure: COLONOSCOPY;  Surgeon: Jamesetta So, MD;  [facility];  Service: Gastroenterology;  Laterality: N/A;   COLONOSCOPY N/A 09/22/2018   Procedure: COLONOSCOPY;  Surgeon: Aviva Signs, MD;  Location: [facility];  Service: Gastroenterology;  Laterality: N/A;   COLONOSCOPY W/ POLYPECTOMY     HERNIA REPAIR     Bilateral inguinal hernia repair X 4   hydrocelectomy     left knee arthroscopy  LITHOTRIPSY     LYMPHADENECTOMY Bilateral 03/22/2015   Procedure: PELVIC LYMPHADENECTOMY;  Surgeon: Alexis Frock, MD;  Location: [facility];  Service: Urology;  Laterality: Bilateral;   NM MYOVIEW LTD  2011   POLYPECTOMY  09/22/2018   Procedure: POLYPECTOMY;  Surgeon: Aviva Signs, MD;  Location: [facility];  Service: Gastroenterology;;   Rodriguez Hevia N/A 03/22/2015   Procedure: ROBOTIC ASSISTED LAPAROSCOPIC RADICAL PROSTATECTOMY WITH INDOCYANINE GREEN DYE INJECTION;  Surgeon: Alexis Frock, MD;  Location: [facility];  Service: Urology;  Laterality: N/A;   ROBOTIC ASSISTED LAPAROSCOPIC LYSIS OF ADHESION  03/22/2015   Procedure: ROBOTIC ASSISTED LAPAROSCOPIC LYSIS OF ADHESION;  Surgeon: Alexis Frock, MD;  Location: [facility];  Service: Urology;;   SHOULDER SURGERY Right    VASECTOMY        Social History:     Social History   Tobacco Use   Smoking status: Every Day    Packs/day: 1.00    Years: 55.00    Total pack years: 55.00    Types: Cigarettes   Smokeless tobacco: Never  Substance Use Topics   Alcohol use: Yes    Alcohol/week: 1.0 standard drink of alcohol    Types: 1 Glasses of wine per  week    Comment: one glass of wine once a month       Family History :     Family History  Problem Relation Age of Onset   Heart failure Father    Stroke Father    Colon cancer Neg Hx       Home Medications:   Prior to Admission medications   Medication Sig Start Date End Date Taking? Authorizing Provider  amLODipine (NORVASC) 10 MG tablet Take 10 mg by mouth daily.   Yes [provider]  aspirin EC 81 MG tablet Take 1 tablet (81 mg total) by mouth daily. Swallow whole. 05/16/22  Yes Barb Merino, MD  hydrochlorothiazide (HYDRODIURIL) 25 MG tablet Take 25 mg by mouth daily.   Yes [provider]  meloxicam (MOBIC) 15 MG tablet Take 15 mg by mouth daily. 01/27/18  Yes [provider]  olmesartan (BENICAR) 20 MG tablet Take 20 mg by mouth daily.   Yes [provider]  pantoprazole (PROTONIX) 20 MG tablet Take 20 mg by mouth daily. 05/20/22  Yes [provider]  atorvastatin (LIPITOR) 20 MG tablet Take 1 tablet (20 mg total) by mouth daily. Patient not taking: Reported on 06/27/2022 05/16/22 08/14/22  Barb Merino, MD  HYDROcodone-acetaminophen [facility]) 10-325 MG tablet Take 1 tablet by mouth every 6 (six) hours. 05/16/22   [provider]     Allergies:     Allergies  Allergen Reactions   Lidocaine Hives   Benzocaine Other (See Comments)    Blisters    Other     Novocaine - blisters in mouth     Physical Exam:   Vitals  Blood pressure (!) 103/48, pulse 74, resp. rate 14, height '5\' 8"'$  (1.727 m), weight 105.2 kg, SpO2 100 %.   1. General well-developed male, appears to be comfortable, with  obesity  2. Normal affect and insight, Not Suicidal or Homicidal, Awake Alert, Oriented X 3.  3. No F.N deficits, ALL C.Nerves Intact, Strength 5/5 all 4 extremities, Sensation intact all 4 extremities, Plantars down going.  4. Ears and Eyes appear Normal, Conjunctivae clear, PERRLA. Moist Oral Mucosa.  5. Supple Neck, No JVD, No  cervical lymphadenopathy appriciated, No Carotid Bruits.  6. Symmetrical Chest  wall movement, Good air movement bilaterally, scattered wheezing  7. RRR, No Gallops, Rubs or Murmurs, No Parasternal Heave.  8. Positive Bowel Sounds, Abdomen Soft, No tenderness, No organomegaly appriciated,No rebound -guarding or rigidity.  9.  No Cyanosis, Normal Skin Turgor, No Skin Rash or Bruise.  10. Good muscle tone,  joints appear normal , no effusions, Normal ROM.  11. No Palpable Lymph Nodes in Neck or Axillae    Data Review:    CBC Recent Labs  Lab 06/27/22 1432  WBC 11.1*  HGB 11.0*  HCT 37.9*  PLT 303  MCV 88.3  MCH 25.6*  MCHC 29.0*  RDW 15.9*  LYMPHSABS 1.5  MONOABS 0.8  EOSABS 0.2  BASOSABS 0.1   ------------------------------------------------------------------------------------------------------------------  Chemistries  Recent Labs  Lab 06/27/22 1432  NA 137  K 4.0  CL 94*  CO2 36*  GLUCOSE 145*  BUN 20  CREATININE 1.50*  CALCIUM 8.8*   ------------------------------------------------------------------------------------------------------------------ estimated creatinine clearance is 53.1 mL/min (A) (by C-G formula based on SCr of 1.5 mg/dL (H)). ------------------------------------------------------------------------------------------------------------------ No results for input(s): "TSH", "T4TOTAL", "T3FREE", "THYROIDAB" in the last 72 hours.  Invalid input(s): "FREET3"  Coagulation profile No results for input(s): "INR", "PROTIME" in the last 168 hours. ------------------------------------------------------------------------------------------------------------------- No results for input(s): "DDIMER" in the last 72 hours. -------------------------------------------------------------------------------------------------------------------  Cardiac Enzymes No results for input(s): "CKMB", "TROPONINI", "MYOGLOBIN" in the last 168 hours.  Invalid input(s):  "CK" ------------------------------------------------------------------------------------------------------------------ No results found for: "BNP"   ---------------------------------------------------------------------------------------------------------------  Urinalysis    Component Value Date/Time   COLORURINE YELLOW 12/01/2016 [place] 12/01/2016 1022   LABSPEC 1.018 12/01/2016 1022   PHURINE 6.0 12/01/2016 1022   GLUCOSEU NEGATIVE 12/01/2016 1022   HGBUR NEGATIVE 12/01/2016 [place] 12/01/2016 [place] 12/01/2016 1022   PROTEINUR NEGATIVE 12/01/2016 1022   NITRITE NEGATIVE 12/01/2016 1022   LEUKOCYTESUR NEGATIVE 12/01/2016 1022    ----------------------------------------------------------------------------------------------------------------   Imaging Results:    DG Chest Portable 1 View  Result Date: 06/27/2022 CLINICAL DATA:  Shortness of breath. EXAM: PORTABLE CHEST 1 VIEW COMPARISON:  March 25, 2019. FINDINGS: Stable cardiomediastinal silhouette. Right lung is clear. Mild left lingular opacity is noted concerning for pneumonia or atelectasis. Bony thorax is unremarkable. IMPRESSION: Mild left lingular opacity is noted concerning for pneumonia or atelectasis. Followup PA and lateral chest X-ray is recommended in 3-4 weeks following trial of antibiotic therapy to ensure resolution and exclude underlying malignancy. Electronically Signed   By: Marijo Conception M.D.   On: 06/27/2022 14:14    My personal review of EKG: Rhythm NSR, Rate 85 /min, QTc 442 , no Acute ST changes   Assessment & Plan:    Principal Problem:   Pneumonia Active Problems:   Prostate cancer ([place])   Stroke (cerebrum) (HCC)   COPD with acute exacerbation (HCC)   Acute respiratory failure with hypoxia (HCC)    Acute hypoxic respiratory failure -He is on room air at baseline, 85% on room air, currently O2 2 L nasal cannula-secondary to  community-acquired pneumonia and COPD exacerbation. -He was encouraged use incentive spirometry and flutter valve, please see discussion below regarding COPD and pneumonia  Community acquired pneumonia -Resents with cough, shortness of breath, chest x-ray significant for left lingular opacity -pneumonia pathway, follow-up blood cultures, Legionella and strep antibodies, follow on sputum cultures -Continue with IV Rocephin and azithromycin -He was encouraged to use incentive spirometry and flutter valve  COPD exacerbation -He presents with wheezing on presentation, improved after  steroids, continue with oral prednisone, will start on scheduled DuoNebs and as needed albuterol   Admission for acute CVA with acute right central vision loss -Continue with aspirin -He is intolerant to statin  Hyperlipidemia -He is intolerant to statin   CKD stage 3A -Function at baseline, continue to hold nephrotoxic medications   HTN -Continue home medication including hydrochlorothiazide, amlodipine and irbesartan   Elevated troponin -Mild ischemia from hypoxia, pneumonia, he denies any chest pain, he is ruled out with troponins 41>> 47   Cigarette smoke -Counseled, report he has cut his smoking drastically, he will be started on nicotine patch   OSA -Not on CPAP due to poor tolerance, outpatient follow-up with pulmonary   Chronic narcotic use for back pain, history of prostate cancer -Significant issue with this admission  Obesity - Body mass index is 35.26 kg/m.   DVT Prophylaxis Heparin  AM Labs Ordered, also please review Full Orders  Family Communication: Admission, patients condition and plan of care including tests being ordered have been discussed with the patient  who indicate understanding and agree with the plan and Code Status.  Code Status Full  Likely DC to  home  Condition GUARDED    Consults called: none    Admission status: inpatient    Time spent in minutes :  75 minutes   Phillips Climes M.D on 06/27/2022 at 4:51 PM   [facility] - Office  [phone]

## 2022-06-27 NOTE — ED Notes (Signed)
This nurse attempted multiple IV's

## 2022-06-27 NOTE — Progress Notes (Signed)
PT arrived to room #327 via [facility] from ED. PT able to ambulate unassisted from [facility] to bed. Pt off O2 for approx 4 minutes while getting settled in bed, SaO2 dropped to 68% on room air. O2 [place] replaced in nostrils and SaO2 up to 90-91%. Pt with congested, occasionally productive cough. Denies c/o at present.

## 2022-06-27 NOTE — ED Provider Notes (Signed)
[facility] EMERGENCY DEPARTMENT Provider Note   CSN: [id number] Arrival date & time: 06/27/22  1325     History  Chief Complaint  Patient presents with   Shortness of Breath    Mark Cannon is a 71 y.o. male.  71 year old male with a history of tobacco use, suspected COPD not currently on home oxygen, HTN, HLD, OSA, CVA with residual right eye vision changes who presents emergency department with shortness of breath and cough.  Patient states that he has been having the symptoms for the past few days.  Says that he is coughing up more clear/tan sputum than usual.  Also says that he has had increasing shortness of breath.  They have a home pulse ox and he is wife is taking his oxygen levels which were 62% today so they came into the emergency department for evaluation.  Says that he was given nebulizers but that they are expired so they have not been able to use them.  Has not had formal PFTs for COPD.  Denies any fevers or chest pain.  No lower extremity swelling.  Was recently hospitalized for back surgery but did not receive antibiotics then.   Shortness of Breath      Home Medications Prior to Admission medications   Medication Sig Start Date End Date Taking? Authorizing Provider  amLODipine (NORVASC) 10 MG tablet Take 10 mg by mouth daily.    [provider]  aspirin EC 81 MG tablet Take 1 tablet (81 mg total) by mouth daily. Swallow whole. 05/16/22   Barb Merino, MD  atorvastatin (LIPITOR) 20 MG tablet Take 1 tablet (20 mg total) by mouth daily. 05/16/22 08/14/22  Barb Merino, MD  hydrochlorothiazide (HYDRODIURIL) 25 MG tablet Take 25 mg by mouth daily.    [provider]  meloxicam (MOBIC) 15 MG tablet Take 15 mg by mouth daily. 01/27/18   [provider]  olmesartan (BENICAR) 20 MG tablet Take 20 mg by mouth daily.    [provider]      Allergies    Lidocaine, Benzocaine, and Other    Review of Systems   Review of Systems   Respiratory:  Positive for shortness of breath.     Physical Exam Updated Vital Signs BP (!) 103/48   Pulse 74   Resp 14   Ht '5\' 8"'$  (1.727 m)   Wt 105.2 kg   SpO2 100%   BMI 35.26 kg/m  Physical Exam Vitals and nursing note reviewed.  Constitutional:      General: He is not in acute distress.    Appearance: He is well-developed.     Comments: On 3 L O2  HENT:     Head: Normocephalic and atraumatic.     Right Ear: External ear normal.     Left Ear: External ear normal.     Nose: Nose normal.  Eyes:     Extraocular Movements: Extraocular movements intact.     Conjunctiva/sclera: Conjunctivae normal.     Pupils: Pupils are equal, round, and reactive to light.  Cardiovascular:     Rate and Rhythm: Normal rate and regular rhythm.     Heart sounds: Normal heart sounds.  Pulmonary:     Effort: Pulmonary effort is normal. No respiratory distress.     Breath sounds: Wheezing (Left greater than right) present.     Comments: Speaking in full sentences Abdominal:     General: There is no distension.     Palpations: Abdomen is soft. There  is no mass.     Tenderness: There is no abdominal tenderness. There is no guarding.  Musculoskeletal:        General: No swelling.     Cervical back: Normal range of motion and neck Cannon.     Right lower leg: No edema.     Left lower leg: No edema.  Skin:    General: Skin is warm and dry.     Capillary Refill: Capillary refill takes less than 2 seconds.  Neurological:     Mental Status: He is alert. Mental status is at baseline.  Psychiatric:        Mood and Affect: Mood normal.        Behavior: Behavior normal.     ED Results / Procedures / Treatments   Labs (all labs ordered are listed, but only abnormal results are displayed) Labs Reviewed  CBC WITH DIFFERENTIAL/PLATELET - Abnormal; Notable for the following components:      Result Value   WBC 11.1 (*)    Hemoglobin 11.0 (*)    HCT 37.9 (*)    MCH 25.6 (*)    MCHC 29.0 (*)     RDW 15.9 (*)    Neutro Abs 8.5 (*)    All other components within normal limits  BASIC METABOLIC PANEL - Abnormal; Notable for the following components:   Chloride 94 (*)    CO2 36 (*)    Glucose, Bld 145 (*)    Creatinine, Ser 1.50 (*)    Calcium 8.8 (*)    GFR, Estimated 49 (*)    All other components within normal limits  TROPONIN I (HIGH SENSITIVITY) - Abnormal; Notable for the following components:   Troponin I (High Sensitivity) 41 (*)    All other components within normal limits  RESP PANEL BY RT-PCR (FLU A&B, COVID) [id number]  TROPONIN I (HIGH SENSITIVITY)    EKG EKG Interpretation  Date/Time:  Thursday June 27 2022 14:05:49 EDT Ventricular Rate:  85 PR Interval:  201 QRS Duration: 90 QT Interval:  371 QTC Calculation: 442 R Axis:   77 Text Interpretation: Sinus rhythm Low voltage, precordial leads Nonspecific T abnormalities, lateral leads Confirmed by Margaretmary Eddy [phone]) on 06/27/2022 3:13:24 PM  Radiology DG Chest Portable 1 View  Result Date: 06/27/2022 CLINICAL DATA:  Shortness of breath. EXAM: PORTABLE CHEST 1 VIEW COMPARISON:  March 25, 2019. FINDINGS: Stable cardiomediastinal silhouette. Right lung is clear. Mild left lingular opacity is noted concerning for pneumonia or atelectasis. Bony thorax is unremarkable. IMPRESSION: Mild left lingular opacity is noted concerning for pneumonia or atelectasis. Followup PA and lateral chest X-ray is recommended in 3-4 weeks following trial of antibiotic therapy to ensure resolution and exclude underlying malignancy. Electronically Signed   By: Marijo Conception M.D.   On: 06/27/2022 14:14    Procedures Procedures   Medications Ordered in ED Medications  cefTRIAXone (ROCEPHIN) 1 g in sodium chloride 0.9 % 100 mL IVPB (1 g Intravenous New Bag/Given 06/27/22 1618)  azithromycin (ZITHROMAX) 500 mg in sodium chloride 0.9 % 250 mL IVPB (has no administration in time range)  ipratropium-albuterol (DUONEB) 0.5-2.5 (3)  MG/3ML nebulizer solution 3 mL (3 mLs Nebulization Given 06/27/22 1557)  predniSONE (DELTASONE) tablet 60 mg (60 mg Oral Given 06/27/22 1616)    ED Course/ Medical Decision Making/ A&P Clinical Course as of 06/27/22 1630  Thu Jun 27, 2022  1624 Spoke with Dr Waldron Labs from hospitalist who has admitted the patient. [RP]    Clinical Course  User Index [RP] Fransico Meadow, MD                           Medical Decision Making Amount and/or Complexity of Data Reviewed Labs: ordered. Radiology: ordered.  Risk Prescription drug management. Decision regarding hospitalization.   Mark Cannon is a 71 y.o. male with history of heavy tobacco use and possible COPD, HTN, HLD, OSA, and CVA with visual right eye vision changes who presents with chief complaint of shortness of breath and cough.  Initial Ddx:  COPD exacerbation, pneumonia, viral URI, CHF exacerbation, MI  MDM:  Feel the patient is likely suffering from a COPD exacerbation with possible coexisting infection as evidenced by his symptoms and wheezing on exam.  CHF exacerbation possible but less likely given that he is not volume overloaded.  MI also considered but feel less likely since he is not having any chest pain, vomiting, or diaphoresis and does not have a history of such.  Plan:  Labs Chest x-ray EKG Steroids DuoNebs  ED Summary:  Patient had an x-ray that did show evidence of viral pneumonia.  Was given ceftriaxone and azithromycin.  Also had elevated troponin but EKG did not show signs of STEMI or new ischemic changes.  Feel this is likely due to demand from his pneumonia and COPD exacerbation.  Given the patient's new 3 L oxygen requirement will admit him to hospitalist for further management.   Dispo: Admit to Floor   Additional history obtained from spouse Records reviewed Care Everywhere The following labs were independently interpreted: Chemistry and Serial Troponins I independently visualized the  following imaging with scope of interpretation limited to determining acute life threatening conditions related to emergency care: Chest x-ray, which revealed  LLL infiltrate    Final Clinical Impression(s) / ED Diagnoses Final diagnoses:  COPD exacerbation ([place])  Pneumonia of left lower lobe due to infectious organism  Acute respiratory failure with hypoxia [facility])    Rx / DC Orders ED Discharge Orders     None         Fransico Meadow, MD 06/27/22 1630

## 2022-06-27 NOTE — ED Notes (Signed)
Pt placed on 4 liters for better oxygenation, put on 3 liters enroute to room.

## 2022-06-27 NOTE — ED Notes (Signed)
Meal tray given 

## 2022-06-28 DIAGNOSIS — J441 Chronic obstructive pulmonary disease with (acute) exacerbation: Secondary | ICD-10-CM | POA: Diagnosis not present

## 2022-06-28 DIAGNOSIS — J9601 Acute respiratory failure with hypoxia: Secondary | ICD-10-CM | POA: Diagnosis not present

## 2022-06-28 DIAGNOSIS — J189 Pneumonia, unspecified organism: Secondary | ICD-10-CM | POA: Diagnosis not present

## 2022-06-28 DIAGNOSIS — C61 Malignant neoplasm of prostate: Secondary | ICD-10-CM | POA: Diagnosis not present

## 2022-06-28 LAB — CBC
HCT: 38.4 % — ABNORMAL LOW (ref 39.0–52.0)
Hemoglobin: 11 g/dL — ABNORMAL LOW (ref 13.0–17.0)
MCH: 25.5 pg — ABNORMAL LOW (ref 26.0–34.0)
MCHC: 28.6 g/dL — ABNORMAL LOW (ref 30.0–36.0)
MCV: 89.1 fL (ref 80.0–100.0)
Platelets: 314 10*3/uL (ref 150–400)
RBC: 4.31 MIL/uL (ref 4.22–5.81)
RDW: 15.8 % — ABNORMAL HIGH (ref 11.5–15.5)
WBC: 11.5 10*3/uL — ABNORMAL HIGH (ref 4.0–10.5)
nRBC: 0.3 % — ABNORMAL HIGH (ref 0.0–0.2)

## 2022-06-28 LAB — BASIC METABOLIC PANEL
Anion gap: 11 (ref 5–15)
BUN: 25 mg/dL — ABNORMAL HIGH (ref 8–23)
CO2: 32 mmol/L (ref 22–32)
Calcium: 8.5 mg/dL — ABNORMAL LOW (ref 8.9–10.3)
Chloride: 94 mmol/L — ABNORMAL LOW (ref 98–111)
Creatinine, Ser: 1.78 mg/dL — ABNORMAL HIGH (ref 0.61–1.24)
GFR, Estimated: 40 mL/min — ABNORMAL LOW (ref >60)
Glucose, Bld: 170 mg/dL — ABNORMAL HIGH (ref 70–99)
Potassium: 4.4 mmol/L (ref 3.5–5.1)
Sodium: 137 mmol/L (ref 135–145)

## 2022-06-28 MED ORDER — MUSCLE RUB 10-15 % EX CREA: TOPICAL_CREAM | CUTANEOUS | Status: DC | PRN

## 2022-06-28 MED ORDER — IPRATROPIUM-ALBUTEROL 0.5-2.5 (3) MG/3ML IN SOLN
3.0000 mL | Freq: Three times a day (TID) | RESPIRATORY_TRACT | Status: DC
  Administered 2022-06-29 – 2022-06-30 (×5): 3 mL via RESPIRATORY_TRACT
  Filled 2022-06-28 (×5): qty 3

## 2022-06-28 MED ORDER — MELOXICAM 7.5 MG PO TABS
15.0000 mg | ORAL_TABLET | Freq: Every day | ORAL | Status: DC
  Filled 2022-06-28: qty 2

## 2022-06-28 MED ORDER — OXYCODONE-ACETAMINOPHEN 5-325 MG PO TABS
2.0000 | ORAL_TABLET | Freq: Once | ORAL | Status: AC
  Administered 2022-06-28: 2 via ORAL
  Filled 2022-06-28: qty 2

## 2022-06-28 NOTE — Progress Notes (Signed)
Breath sounds appear clear , no wheezes or rhonchi noted. Nebs have been reduced to Tid so patient can sleep. Have left prn neb if needed. Patient can do 1200 cc on incentive. Saturation does drop in 80's with oxygen removed, which he does remove. He does not show increased work of breathing or discomfort even with low oxygen saturation. Suspect his oxygen saturation has been low for some time. ( Before this visit to hospital ). Patient still smokes , had pass prostate ca. Has enlarged heart by x-ray. Suspect this is more atelectasis because his life style is limited do to Back Issues.

## 2022-06-28 NOTE — Progress Notes (Addendum)
PROGRESS NOTE    Mark Cannon  ZOX:[id number] DOB: August 04, 1951 DOA: 06/27/2022 PCP: Jake Samples, PA-C   Brief Narrative: Mark Cannon is a 71 y.o. male with a history of hypertension, hyperlipidemia, OSA, CKD stage IIIa, tobacco use, lumbar stenosis, prostate cancer s/p prostatectomy, CVA. Patient presented secondary to shortness of breath and was found to have evidence of pneumonia and COPD exacerbation in addition to hypoxia. Ceftriaxone/azithromycin, prednisone and supplemental oxygen started.   Assessment and Plan:  Acute respiratory failure with hypoxia Patient is officially on room air, however it appears he may have qualified for chronic oxygen use on previous admission. Current hypoxia in setting of pneumonia and COPD exacerbation. Currently requiring 4 L/min of oxygen. -Wean to room air as able -Ambulatory pulse ox  Community acquired pneumonia Chest x-ray significant for left lingular opacity with concern for pneumonia. Patient started on Ceftriaxone and azithromycin for empiric treatment. -Continue Ceftriaxone and azithromycin -Sputum culture -Strep pneumoniae/legionella ag  COPD exacerbation Unsure of etiology for exacerbation. Associated sputum production. Patient started on steroids in addition to antibiotics as mentioned above. -Continue prednisone and antibiotics -Continue Duonebs  History of CVA Associated right central vision loss. Intolerant to statin therapy. -Continue aspirin  Hyperlipidemia Intolerant to statin therapy.  CKD stage IIIa Baseline creatinine of about 1.5. Slightly elevated to 1.78. Some hypotension overnight which is resolved. -BMP in AM  Primary hypertension Patient is on hydrochlorothiazide, olmesartan and amlodipine as an outpatient -Continue amlodipine -Hold olmesartan and hydrochlorothiazide pending stabilization of creatinine  Elevated troponin Mildly elevated. Not consistent with ACS.  Tobacco use -Continue  nicotine patch  OSA Not on CPAP secondary to intolerance.  Obesity Body mass index is 35.73 kg/m.    DVT prophylaxis: Heparin subq Code Status:   Code Status: Full Code Family Communication: None at bedside Disposition Plan: Discharge home likely in 1-2 days pending ability to wean down oxygen   Consultants:  None  Procedures:  None  Antimicrobials: Ceftriaxone Azithromycin    Subjective: Patient reports no dyspnea this morning. Continues to have brownish sputum production.  Objective: BP (!) 118/51   Pulse 81   Temp 98.2 F (36.8 C)   Resp 19   Ht '5\' 8"'$  (1.727 m)   Wt 106.6 kg   SpO2 (!) 86%   BMI 35.73 kg/m   Examination:  General exam: Appears calm and comfortable Respiratory system: Clear to auscultation. Respiratory effort normal. Cardiovascular system: S1 & S2 heard, RRR. No murmurs, rubs, gallops or clicks. Gastrointestinal system: Abdomen is nondistended, soft and nontender. Normal bowel sounds heard. Central nervous system: Alert and oriented. No focal neurological deficits. Musculoskeletal: No edema. No calf tenderness Skin: No cyanosis. No rashes Psychiatry: Judgement and insight appear normal. Mood & affect appropriate.    Data Reviewed: I have personally reviewed following labs and imaging studies  CBC Lab Results  Component Value Date   WBC 11.5 (H) 06/28/2022   RBC 4.31 06/28/2022   HGB 11.0 (L) 06/28/2022   HCT 38.4 (L) 06/28/2022   MCV 89.1 06/28/2022   MCH 25.5 (L) 06/28/2022   PLT 314 06/28/2022   MCHC 28.6 (L) 06/28/2022   RDW 15.8 (H) 06/28/2022   LYMPHSABS 1.5 06/27/2022   MONOABS 0.8 06/27/2022   EOSABS 0.2 06/27/2022   BASOSABS 0.1 81/19/1478     Last metabolic panel Lab Results  Component Value Date   NA 137 06/28/2022   K 4.4 06/28/2022   CL 94 (L) 06/28/2022   CO2 32 06/28/2022  BUN 25 (H) 06/28/2022   CREATININE 1.78 (H) 06/28/2022   GLUCOSE 170 (H) 06/28/2022   GFRNONAA 40 (L) 06/28/2022   GFRAA >60  03/25/2019   CALCIUM 8.5 (L) 06/28/2022   PROT 7.1 03/25/2019   ALBUMIN 3.8 03/25/2019   BILITOT 0.4 03/25/2019   ALKPHOS 66 03/25/2019   AST 21 03/25/2019   ALT 31 03/25/2019   ANIONGAP 11 06/28/2022    GFR: Estimated Creatinine Clearance: 45.1 mL/min (A) (by C-G formula based on SCr of 1.78 mg/dL (H)).  Recent Results (from the past 240 hour(s))  Resp Panel by RT-PCR (Flu A&B, Covid) Anterior Nasal Swab     Status: None   Collection Time: 06/27/22  1:57 PM   Specimen: Anterior Nasal Swab  Result Value Ref Range Status   SARS Coronavirus 2 by RT PCR NEGATIVE NEGATIVE Final    Comment: (NOTE) SARS-CoV-2 target nucleic acids are NOT DETECTED.  The SARS-CoV-2 RNA is generally detectable in upper respiratory specimens during the acute phase of infection. The lowest concentration of SARS-CoV-2 viral copies this assay can detect is 138 copies/mL. A negative result does not preclude SARS-Cov-2 infection and should not be used as the sole basis for treatment or other patient management decisions. A negative result may occur with  improper specimen collection/handling, submission of specimen other than nasopharyngeal swab, presence of viral mutation(s) within the areas targeted by this assay, and inadequate number of viral copies(<138 copies/mL). A negative result must be combined with clinical observations, patient history, and epidemiological information. The expected result is Negative.  Fact Sheet for Patients:  EntrepreneurPulse.com.au  Fact Sheet for Healthcare Providers:  IncredibleEmployment.be  This test is no t yet approved or cleared by the [place] FDA and  has been authorized for detection and/or diagnosis of SARS-CoV-2 by FDA under an Emergency Use Authorization (EUA). This EUA will remain  in effect (meaning this test can be used) for the duration of the COVID-19 declaration under Section 564(b)(1) of the Act,  21 U.S.C.section 360bbb-3(b)(1), unless the authorization is terminated  or revoked sooner.       Influenza A by PCR NEGATIVE NEGATIVE Final   Influenza B by PCR NEGATIVE NEGATIVE Final    Comment: (NOTE) The Xpert Xpress SARS-CoV-2/FLU/RSV plus assay is intended as an aid in the diagnosis of influenza from Nasopharyngeal swab specimens and should not be used as a sole basis for treatment. Nasal washings and aspirates are unacceptable for Xpert Xpress SARS-CoV-2/FLU/RSV testing.  Fact Sheet for Patients: EntrepreneurPulse.com.au  Fact Sheet for Healthcare Providers: IncredibleEmployment.be  This test is not yet approved or cleared by the [place] FDA and has been authorized for detection and/or diagnosis of SARS-CoV-2 by FDA under an Emergency Use Authorization (EUA). This EUA will remain in effect (meaning this test can be used) for the duration of the COVID-19 declaration under Section 564(b)(1) of the Act, 21 U.S.C. section 360bbb-3(b)(1), unless the authorization is terminated or revoked.  Performed at [facility], [address], [place],  [address]       Radiology Studies: DG Chest Portable 1 View  Result Date: 06/27/2022 CLINICAL DATA:  Shortness of breath. EXAM: PORTABLE CHEST 1 VIEW COMPARISON:  March 25, 2019. FINDINGS: Stable cardiomediastinal silhouette. Right lung is clear. Mild left lingular opacity is noted concerning for pneumonia or atelectasis. Bony thorax is unremarkable. IMPRESSION: Mild left lingular opacity is noted concerning for pneumonia or atelectasis. Followup PA and lateral chest X-ray is recommended in 3-4 weeks following trial of antibiotic therapy  to ensure resolution and exclude underlying malignancy. Electronically Signed   By: Marijo Conception M.D.   On: 06/27/2022 14:14      LOS: 1 day    Cordelia Poche, MD [facility] 06/28/2022, 12:12 PM   If 7PM-7AM, please contact  night-coverage [email or web address]

## 2022-06-28 NOTE — Care Management Important Message (Signed)
Important Message  Patient Details  Name: Mark Cannon MRN: [id number] Date of Birth: [birth year]   Medicare Important Message Given:  Yes     Tommy Medal 06/28/2022, 11:45 AM

## 2022-06-28 NOTE — Progress Notes (Signed)
SATURATION QUALIFICATIONS: (This note is used to comply with regulatory documentation for home oxygen)  Patient Saturations on Room Air at Rest = 86%  Patient Saturations on Room Air while Ambulating = 80%  Patient Saturations on 4 Liters of oxygen while Ambulating = 92%  Please briefly explain why patient needs home oxygen:

## 2022-06-28 NOTE — Hospital Course (Signed)
Mark Cannon is a 71 y.o. male with a history of hypertension, hyperlipidemia, OSA, CKD stage IIIa, tobacco use, lumbar stenosis, prostate cancer s/p prostatectomy, CVA. Patient presented secondary to shortness of breath and was found to have evidence of pneumonia and COPD exacerbation in addition to hypoxia. Ceftriaxone/azithromycin, prednisone and supplemental oxygen started.

## 2022-06-29 DIAGNOSIS — C61 Malignant neoplasm of prostate: Secondary | ICD-10-CM | POA: Diagnosis not present

## 2022-06-29 DIAGNOSIS — J441 Chronic obstructive pulmonary disease with (acute) exacerbation: Secondary | ICD-10-CM | POA: Diagnosis not present

## 2022-06-29 DIAGNOSIS — J189 Pneumonia, unspecified organism: Secondary | ICD-10-CM | POA: Diagnosis not present

## 2022-06-29 DIAGNOSIS — J9601 Acute respiratory failure with hypoxia: Secondary | ICD-10-CM | POA: Diagnosis not present

## 2022-06-29 LAB — BASIC METABOLIC PANEL
Anion gap: 8 (ref 5–15)
BUN: 37 mg/dL — ABNORMAL HIGH (ref 8–23)
CO2: 32 mmol/L (ref 22–32)
Calcium: 8.2 mg/dL — ABNORMAL LOW (ref 8.9–10.3)
Chloride: 95 mmol/L — ABNORMAL LOW (ref 98–111)
Creatinine, Ser: 1.99 mg/dL — ABNORMAL HIGH (ref 0.61–1.24)
GFR, Estimated: 35 mL/min — ABNORMAL LOW (ref >60)
Glucose, Bld: 172 mg/dL — ABNORMAL HIGH (ref 70–99)
Potassium: 4.1 mmol/L (ref 3.5–5.1)
Sodium: 135 mmol/L (ref 135–145)

## 2022-06-29 MED ORDER — MELATONIN 3 MG PO TABS
6.0000 mg | ORAL_TABLET | Freq: Every day | ORAL | Status: DC
  Administered 2022-06-29: 6 mg via ORAL
  Filled 2022-06-29: qty 2

## 2022-06-29 MED ORDER — SODIUM CHLORIDE 0.9 % IV BOLUS
500.0000 mL | Freq: Once | INTRAVENOUS | Status: AC
  Administered 2022-06-29: 500 mL via INTRAVENOUS

## 2022-06-29 NOTE — Progress Notes (Signed)
Patients blood pressure 109/49, pulse 62, patient has Norvasc 10 mg ordered. MD Nettey made aware. New orders placed to discontinue medication.

## 2022-06-29 NOTE — Progress Notes (Signed)
PROGRESS NOTE    Mark Cannon  TWS:[id number] DOB: 03-Dec-1950 DOA: 06/27/2022 PCP: Jake Samples, PA-C   Brief Narrative: Mark Cannon is a 71 y.o. male with a history of hypertension, hyperlipidemia, OSA, CKD stage IIIa, tobacco use, lumbar stenosis, prostate cancer s/p prostatectomy, CVA. Patient presented secondary to shortness of breath and was found to have evidence of pneumonia and COPD exacerbation in addition to hypoxia. Ceftriaxone/azithromycin, prednisone and supplemental oxygen started.   Assessment and Plan:  Acute respiratory failure with hypoxia Patient is officially on room air, however it appears he may have qualified for chronic oxygen use on previous admission. Current hypoxia in setting of pneumonia and COPD exacerbation. Currently requiring 4 L/min of oxygen. Requiring 4 L/min with ambulation. -Wean to room air as able -Ambulatory pulse ox  Community acquired pneumonia Chest x-ray significant for left lingular opacity with concern for pneumonia. Patient started on Ceftriaxone and azithromycin for empiric treatment. -Continue Ceftriaxone and azithromycin -Sputum culture -Strep pneumoniae/legionella ag  COPD exacerbation Unsure of etiology for exacerbation. Associated sputum production. Patient started on steroids in addition to antibiotics as mentioned above. -Continue prednisone and antibiotics -Continue Duonebs  History of CVA Associated right central vision loss. Intolerant to statin therapy. -Continue aspirin  Hyperlipidemia Intolerant to statin therapy.  AKI on CKD stage IIIa Baseline creatinine of about 1.5. Creatinine up to 1.99 on BMP today. Complicated by hypotension and olmesartan/hydrochlorothiazide use as an outpatient. -BMP in AM -NS IV bolus  Primary hypertension Patient is on hydrochlorothiazide, olmesartan and amlodipine as an outpatient -Continue amlodipine -Hold olmesartan and hydrochlorothiazide pending stabilization of  creatinine  Elevated troponin Mildly elevated. Not consistent with ACS.  Tobacco use -Continue nicotine patch  OSA Not on CPAP secondary to intolerance.  Obesity Body mass index is 35.73 kg/m.    DVT prophylaxis: Heparin subq Code Status:   Code Status: Full Code Family Communication: None at bedside Disposition Plan: Discharge home likely in 1-2 days pending ability to wean down oxygen and improvement of AKI   Consultants:  None  Procedures:  None  Antimicrobials: Ceftriaxone Azithromycin    Subjective: Patient reports no issues overnight. Patient reports continued productive cough.   Objective: BP (!) 109/49 Comment: MD Johnette Teigen made aware  Pulse 69   Temp (!) 97.5 F (36.4 C) (Oral)   Resp 19   Ht '5\' 8"'$  (1.727 m)   Wt 106.6 kg   SpO2 (!) 78%   BMI 35.73 kg/m   Examination:  General exam: Appears calm and comfortable Respiratory system: Clear to auscultation. Respiratory effort normal. Cardiovascular system: S1 & S2 heard, RRR. No murmurs, rubs, gallops or clicks. Gastrointestinal system: Abdomen is nondistended, soft and nontender. Normal bowel sounds heard. Central nervous system: Alert and oriented. No focal neurological deficits. Musculoskeletal: No edema. No calf tenderness Skin: No cyanosis. No rashes Psychiatry: Judgement and insight appear normal. Mood & affect appropriate.    Data Reviewed: I have personally reviewed following labs and imaging studies  CBC Lab Results  Component Value Date   WBC 11.5 (H) 06/28/2022   RBC 4.31 06/28/2022   HGB 11.0 (L) 06/28/2022   HCT 38.4 (L) 06/28/2022   MCV 89.1 06/28/2022   MCH 25.5 (L) 06/28/2022   PLT 314 06/28/2022   MCHC 28.6 (L) 06/28/2022   RDW 15.8 (H) 06/28/2022   LYMPHSABS 1.5 06/27/2022   MONOABS 0.8 06/27/2022   EOSABS 0.2 06/27/2022   BASOSABS 0.1 00/17/4944     Last metabolic panel Lab Results  Component Value Date   NA 135 06/29/2022   K 4.1 06/29/2022   CL 95 (L)  06/29/2022   CO2 32 06/29/2022   BUN 37 (H) 06/29/2022   CREATININE 1.99 (H) 06/29/2022   GLUCOSE 172 (H) 06/29/2022   GFRNONAA 35 (L) 06/29/2022   GFRAA >60 03/25/2019   CALCIUM 8.2 (L) 06/29/2022   PROT 7.1 03/25/2019   ALBUMIN 3.8 03/25/2019   BILITOT 0.4 03/25/2019   ALKPHOS 66 03/25/2019   AST 21 03/25/2019   ALT 31 03/25/2019   ANIONGAP 8 06/29/2022    GFR: Estimated Creatinine Clearance: 40.3 mL/min (A) (by C-G formula based on SCr of 1.99 mg/dL (H)).  Recent Results (from the past 240 hour(s))  Resp Panel by RT-PCR (Flu A&B, Covid) Anterior Nasal Swab     Status: None   Collection Time: 06/27/22  1:57 PM   Specimen: Anterior Nasal Swab  Result Value Ref Range Status   SARS Coronavirus 2 by RT PCR NEGATIVE NEGATIVE Final    Comment: (NOTE) SARS-CoV-2 target nucleic acids are NOT DETECTED.  The SARS-CoV-2 RNA is generally detectable in upper respiratory specimens during the acute phase of infection. The lowest concentration of SARS-CoV-2 viral copies this assay can detect is 138 copies/mL. A negative result does not preclude SARS-Cov-2 infection and should not be used as the sole basis for treatment or other patient management decisions. A negative result may occur with  improper specimen collection/handling, submission of specimen other than nasopharyngeal swab, presence of viral mutation(s) within the areas targeted by this assay, and inadequate number of viral copies(<138 copies/mL). A negative result must be combined with clinical observations, patient history, and epidemiological information. The expected result is Negative.  Fact Sheet for Patients:  EntrepreneurPulse.com.au  Fact Sheet for Healthcare Providers:  IncredibleEmployment.be  This test is no t yet approved or cleared by the [place] FDA and  has been authorized for detection and/or diagnosis of SARS-CoV-2 by FDA under an Emergency Use Authorization  (EUA). This EUA will remain  in effect (meaning this test can be used) for the duration of the COVID-19 declaration under Section 564(b)(1) of the Act, 21 U.S.C.section 360bbb-3(b)(1), unless the authorization is terminated  or revoked sooner.       Influenza A by PCR NEGATIVE NEGATIVE Final   Influenza B by PCR NEGATIVE NEGATIVE Final    Comment: (NOTE) The Xpert Xpress SARS-CoV-2/FLU/RSV plus assay is intended as an aid in the diagnosis of influenza from Nasopharyngeal swab specimens and should not be used as a sole basis for treatment. Nasal washings and aspirates are unacceptable for Xpert Xpress SARS-CoV-2/FLU/RSV testing.  Fact Sheet for Patients: EntrepreneurPulse.com.au  Fact Sheet for Healthcare Providers: IncredibleEmployment.be  This test is not yet approved or cleared by the [place] FDA and has been authorized for detection and/or diagnosis of SARS-CoV-2 by FDA under an Emergency Use Authorization (EUA). This EUA will remain in effect (meaning this test can be used) for the duration of the COVID-19 declaration under Section 564(b)(1) of the Act, 21 U.S.C. section 360bbb-3(b)(1), unless the authorization is terminated or revoked.  Performed at [facility], [address], [place], [place] [address]       Radiology Studies: DG Chest Portable 1 View  Result Date: 06/27/2022 CLINICAL DATA:  Shortness of breath. EXAM: PORTABLE CHEST 1 VIEW COMPARISON:  March 25, 2019. FINDINGS: Stable cardiomediastinal silhouette. Right lung is clear. Mild left lingular opacity is noted concerning for pneumonia or atelectasis. Bony thorax is unremarkable. IMPRESSION: Mild  left lingular opacity is noted concerning for pneumonia or atelectasis. Followup PA and lateral chest X-ray is recommended in 3-4 weeks following trial of antibiotic therapy to ensure resolution and exclude underlying malignancy. Electronically Signed   By: Marijo Conception M.D.    On: 06/27/2022 14:14      LOS: 2 days    Cordelia Poche, MD [facility] 06/29/2022, 9:53 AM   If 7PM-7AM, please contact night-coverage [email or web address]

## 2022-06-29 NOTE — Progress Notes (Signed)
SATURATION QUALIFICATIONS: (This note is used to comply with regulatory documentation for home oxygen)  Patient Saturations on Room Air at Rest = 87%  Patient Saturations on Room Air while Ambulating = 82%  Patient Saturations on 4 Liters of oxygen while Ambulating = 93%  Please briefly explain why patient needs home oxygen: Patient reported no complaints of shortness or breath.

## 2022-06-30 DIAGNOSIS — J189 Pneumonia, unspecified organism: Secondary | ICD-10-CM | POA: Diagnosis not present

## 2022-06-30 DIAGNOSIS — J9601 Acute respiratory failure with hypoxia: Secondary | ICD-10-CM | POA: Diagnosis not present

## 2022-06-30 DIAGNOSIS — J441 Chronic obstructive pulmonary disease with (acute) exacerbation: Secondary | ICD-10-CM | POA: Diagnosis not present

## 2022-06-30 LAB — BASIC METABOLIC PANEL
Anion gap: 6 (ref 5–15)
BUN: 37 mg/dL — ABNORMAL HIGH (ref 8–23)
CO2: 36 mmol/L — ABNORMAL HIGH (ref 22–32)
Calcium: 8.4 mg/dL — ABNORMAL LOW (ref 8.9–10.3)
Chloride: 94 mmol/L — ABNORMAL LOW (ref 98–111)
Creatinine, Ser: 1.48 mg/dL — ABNORMAL HIGH (ref 0.61–1.24)
GFR, Estimated: 50 mL/min — ABNORMAL LOW (ref >60)
Glucose, Bld: 170 mg/dL — ABNORMAL HIGH (ref 70–99)
Potassium: 5.2 mmol/L — ABNORMAL HIGH (ref 3.5–5.1)
Sodium: 136 mmol/L (ref 135–145)

## 2022-06-30 LAB — POTASSIUM: Potassium: 5 mmol/L (ref 3.5–5.1)

## 2022-06-30 MED ORDER — PREDNISONE 20 MG PO TABS: 40.0000 mg | ORAL_TABLET | Freq: Every day | ORAL | 0 refills | Status: AC

## 2022-06-30 MED ORDER — AZITHROMYCIN 500 MG PO TABS: 500.0000 mg | ORAL_TABLET | Freq: Every day | ORAL | 0 refills | Status: AC

## 2022-06-30 MED ORDER — SODIUM CHLORIDE 0.9 % IV SOLN: INTRAVENOUS | Status: DC | PRN

## 2022-06-30 MED ORDER — IPRATROPIUM-ALBUTEROL 0.5-2.5 (3) MG/3ML IN SOLN: RESPIRATORY_TRACT | 2 refills | Status: DC

## 2022-06-30 MED ORDER — SODIUM ZIRCONIUM CYCLOSILICATE 10 G PO PACK
10.0000 g | PACK | Freq: Once | ORAL | Status: AC
  Administered 2022-06-30: 10 g via ORAL
  Filled 2022-06-30: qty 1

## 2022-06-30 MED ORDER — CEFDINIR 300 MG PO CAPS: 300.0000 mg | ORAL_CAPSULE | Freq: Two times a day (BID) | ORAL | 0 refills | Status: AC

## 2022-06-30 NOTE — Discharge Summary (Signed)
Physician Discharge Summary   Patient: Mark Cannon MRN: [id number] DOB: October 22, 1950  Admit date:     06/27/2022  Discharge date: 06/30/22  Discharge Physician: Cordelia Poche, MD   PCP: Jake Samples, PA-C   Recommendations at discharge:  PCP follow-up Refer to pulmonology (referral order placed on discharge) BMP in 3-5 days  Discharge Diagnoses: Principal Problem:   Pneumonia Active Problems:   Prostate cancer ([place])   Stroke (cerebrum) ([place])   COPD with acute exacerbation ([place])   Acute respiratory failure with hypoxia ([place])  Resolved Problems:   * No resolved hospital problems. *  Hospital Course: Mark Cannon is a 71 y.o. male with a history of hypertension, hyperlipidemia, OSA, CKD stage IIIa, tobacco use, lumbar stenosis, prostate cancer s/p prostatectomy, CVA. Patient presented secondary to shortness of breath and was found to have evidence of pneumonia and COPD exacerbation in addition to hypoxia. Ceftriaxone/azithromycin, prednisone and supplemental oxygen started. Patient to continue antibiotics and prednisone. Discharge on oxygen. Recommendation for pulmonology follow-up.  Assessment and Plan:  Acute respiratory failure with hypoxia Patient is officially on room air, however it appears he may have qualified for chronic oxygen use on previous admission. Current hypoxia in setting of pneumonia and COPD exacerbation. Requiring 4 L/min with ambulation; outpatient oxygen set up for discharge.   Community acquired pneumonia Chest x-ray significant for left lingular opacity with concern for pneumonia. Patient started on Ceftriaxone and azithromycin for empiric treatment. Patient transitioned to Cefdinir and azithromycin to complete course.   COPD exacerbation Unsure of etiology for exacerbation. Associated sputum production. Patient started on steroids in addition to antibiotics as mentioned above. Patient discharged to complete prednisone burst. New prescription for  Duoneb placed. Antibiotics as mentioned above. Recommendation for outpatient pulmonology follow-up.   History of CVA Associated right central vision loss. Intolerant to statin therapy. Continue aspirin.   Hyperlipidemia Intolerant to statin therapy.   AKI on CKD stage IIIa Baseline creatinine of about 1.5. Creatinine up to a peak of 1.99. Complicated by hypotension and olmesartan/hydrochlorothiazide use as an outpatient. NS IV fluid bolus given on 9/16 with resultant creatinine of 1.48 on discharge. Recommend repeat BMP.   Primary hypertension Patient is on hydrochlorothiazide, olmesartan and amlodipine as an outpatient. Hydrochlorothiazide and olmesartan held secondary to AKI. Recommendation to hold on discharge.   Elevated troponin Mildly elevated. Not consistent with ACS.   Tobacco use Patient counseled on cessation.   OSA Not on CPAP secondary to intolerance.   Obesity Estimated body mass index is 35.73 kg/m as calculated from the following:   Height as of this encounter: '5\' 8"'$  (1.727 m).   Weight as of this encounter: 106.6 kg.   Consultants: None Procedures performed: None  Disposition: Home Diet recommendation: Cardiac diet  DISCHARGE MEDICATION: Allergies as of 06/30/2022       Reactions   Lidocaine Hives   Benzocaine Other (See Comments)   Blisters    Other    Novocaine - blisters in mouth        Medication List     STOP taking these medications    hydrochlorothiazide 25 MG tablet Commonly known as: HYDRODIURIL   meloxicam 15 MG tablet Commonly known as: MOBIC   olmesartan 20 MG tablet Commonly known as: BENICAR       TAKE these medications    amLODipine 10 MG tablet Commonly known as: NORVASC Take 10 mg by mouth daily.   aspirin EC 81 MG tablet Take 1 tablet (81 mg total) by  mouth daily. Swallow whole.   azithromycin 500 MG tablet Commonly known as: Zithromax Take 1 tablet (500 mg total) by mouth daily for 1 day. Start taking on:  July 01, 2022   cefdinir 300 MG capsule Commonly known as: OMNICEF Take 1 capsule (300 mg total) by mouth 2 (two) times daily for 3 days. Start taking on: July 01, 2022   HYDROcodone-acetaminophen 10-325 MG tablet Commonly known as: NORCO Take 1 tablet by mouth every 6 (six) hours.   ipratropium-albuterol 0.5-2.5 (3) MG/3ML Soln Commonly known as: DUONEB Take 3 mLs by nebulization every 4 (four) hours while awake for 2 days, THEN 3 mLs every 4 (four) hours as needed. Start taking on: June 30, 2022   pantoprazole 20 MG tablet Commonly known as: PROTONIX Take 20 mg by mouth daily.   predniSONE 20 MG tablet Commonly known as: DELTASONE Take 2 tablets (40 mg total) by mouth daily with breakfast for 1 day. Start taking on: July 01, 2022               Durable Medical Equipment  (From admission, onward)           Start     Ordered   06/29/22 0759  For home use only DME oxygen  Once       Question Answer Comment  Length of Need Lifetime   Mode or (Route) Nasal cannula   Liters per Minute 4   Frequency Continuous (stationary and portable oxygen unit needed)   Oxygen delivery system Gas      06/29/22 0758            Follow-up Information     Jake Samples, PA-C. Schedule an appointment as soon as possible for a visit in 1 week(s).   Specialty: Family Medicine Why: For hospital follow-up Contact information: [address] [place] [address] [phone]                Discharge Exam: BP (!) 133/50 (BP Location: Left Arm)   Pulse 89   Temp 97.7 F (36.5 C) (Oral)   Resp 20   Ht '5\' 8"'$  (1.727 m)   Wt 106.6 kg   SpO2 91%   BMI 35.73 kg/m   General exam: Appears calm and comfortable Respiratory system: Mild bilateral wheezing. Respiratory effort normal. Cardiovascular system: S1 & S2 heard, RRR. No murmurs, rubs, gallops or clicks. Gastrointestinal system: Abdomen is nondistended, soft and nontender. No  organomegaly or masses felt. Normal bowel sounds heard. Central nervous system: Alert and oriented. No focal neurological deficits. Musculoskeletal: No edema. No calf tenderness Skin: No cyanosis. No rashes Psychiatry: Judgement and insight appear normal. Mood & affect appropriate.   Condition at discharge: stable  The results of significant diagnostics from this hospitalization (including imaging, microbiology, ancillary and laboratory) are listed below for reference.   Imaging Studies: DG Chest Portable 1 View  Result Date: 06/27/2022 CLINICAL DATA:  Shortness of breath. EXAM: PORTABLE CHEST 1 VIEW COMPARISON:  March 25, 2019. FINDINGS: Stable cardiomediastinal silhouette. Right lung is clear. Mild left lingular opacity is noted concerning for pneumonia or atelectasis. Bony thorax is unremarkable. IMPRESSION: Mild left lingular opacity is noted concerning for pneumonia or atelectasis. Followup PA and lateral chest X-ray is recommended in 3-4 weeks following trial of antibiotic therapy to ensure resolution and exclude underlying malignancy. Electronically Signed   By: Marijo Conception M.D.   On: 06/27/2022 14:14    Microbiology: Results for orders placed or performed during the  hospital encounter of 06/27/22  Resp Panel by RT-PCR (Flu A&B, Covid) Anterior Nasal Swab     Status: None   Collection Time: 06/27/22  1:57 PM   Specimen: Anterior Nasal Swab  Result Value Ref Range Status   SARS Coronavirus 2 by RT PCR NEGATIVE NEGATIVE Final    Comment: (NOTE) SARS-CoV-2 target nucleic acids are NOT DETECTED.  The SARS-CoV-2 RNA is generally detectable in upper respiratory specimens during the acute phase of infection. The lowest concentration of SARS-CoV-2 viral copies this assay can detect is 138 copies/mL. A negative result does not preclude SARS-Cov-2 infection and should not be used as the sole basis for treatment or other patient management decisions. A negative result may occur with   improper specimen collection/handling, submission of specimen other than nasopharyngeal swab, presence of viral mutation(s) within the areas targeted by this assay, and inadequate number of viral copies(<138 copies/mL). A negative result must be combined with clinical observations, patient history, and epidemiological information. The expected result is Negative.  Fact Sheet for Patients:  EntrepreneurPulse.com.au  Fact Sheet for Healthcare Providers:  IncredibleEmployment.be  This test is no t yet approved or cleared by the [place] FDA and  has been authorized for detection and/or diagnosis of SARS-CoV-2 by FDA under an Emergency Use Authorization (EUA). This EUA will remain  in effect (meaning this test can be used) for the duration of the COVID-19 declaration under Section 564(b)(1) of the Act, 21 U.S.C.section 360bbb-3(b)(1), unless the authorization is terminated  or revoked sooner.       Influenza A by PCR NEGATIVE NEGATIVE Final   Influenza B by PCR NEGATIVE NEGATIVE Final    Comment: (NOTE) The Xpert Xpress SARS-CoV-2/FLU/RSV plus assay is intended as an aid in the diagnosis of influenza from Nasopharyngeal swab specimens and should not be used as a sole basis for treatment. Nasal washings and aspirates are unacceptable for Xpert Xpress SARS-CoV-2/FLU/RSV testing.  Fact Sheet for Patients: EntrepreneurPulse.com.au  Fact Sheet for Healthcare Providers: IncredibleEmployment.be  This test is not yet approved or cleared by the [place] FDA and has been authorized for detection and/or diagnosis of SARS-CoV-2 by FDA under an Emergency Use Authorization (EUA). This EUA will remain in effect (meaning this test can be used) for the duration of the COVID-19 declaration under Section 564(b)(1) of the Act, 21 U.S.C. section 360bbb-3(b)(1), unless the authorization is terminated  or revoked.  Performed at [facility], [address] [place], [place] [address]     Labs: CBC: Recent Labs  Lab 06/27/22 1432 06/27/22 2049 06/28/22 0501  WBC 11.1* 11.4* 11.5*  NEUTROABS 8.5*  --   --   HGB 11.0* 11.1* 11.0*  HCT 37.9* 37.5* 38.4*  MCV 88.3 87.8 89.1  PLT 303 309 109   Basic Metabolic Panel: Recent Labs  Lab 06/27/22 1432 06/27/22 2049 06/28/22 0501 06/29/22 0531 06/30/22 0528 06/30/22 1158  NA 137  --  137 135 136  --   K 4.0  --  4.4 4.1 5.2* 5.0  CL 94*  --  94* 95* 94*  --   CO2 36*  --  32 32 36*  --   GLUCOSE 145*  --  170* 172* 170*  --   BUN 20  --  25* 37* 37*  --   CREATININE 1.50* 1.72* 1.78* 1.99* 1.48*  --   CALCIUM 8.8*  --  8.5* 8.2* 8.4*  --     Discharge time spent: 35 minutes.  Signed: Cordelia Poche, MD  [facility] 06/30/2022

## 2022-06-30 NOTE — Progress Notes (Addendum)
TOC CSW spoke with Zayante from [facility], [phone]. Edwyna Ready is currently working on order.  Shyah Cadmus Tarpley-Weidmann, MSW, LCSW-A Pronouns:  She/Her/Hers [facility] Care Clinical Social Worker Direct Number:  [phone] [email or web address]

## 2022-06-30 NOTE — Progress Notes (Signed)
Walk test complete.  Needs 4 liters ambulating to have O2 sat at 91%

## 2022-06-30 NOTE — Progress Notes (Signed)
IV removed and discharge instructions reviewed with patient.  O2 delivered to room and taken down by [facility] to main entrance.  Neighbor to drive home.

## 2022-06-30 NOTE — Discharge Instructions (Addendum)
Mark Cannon,  You were in the hospital with pneumonia and a COPD exacerbation. You have improved from admission but are still requiring oxygen. You likely have significant underlying COPD and would benefit from seeing a pulmonologist. I will place a referral for you. Please continue your steroids and antibiotics as recommended. The recommended oxygen range for you is 88-92%. Please do not use your oxygen to keep yourself at 100% oxygen as this may be harmful in the long term. Also, I have recommended to hold your olmesartan and hydrochlorothiazide as these can worsen your kidney function in the short term; once your kidney function is stable, these may be restarted. I am also recommending for you to hold off on taking meloxicam (Mobic) until your kidney function is improved.

## 2022-06-30 NOTE — Progress Notes (Signed)
SATURATION QUALIFICATIONS: (This note is used to comply with regulatory documentation for home oxygen)  Patient Saturations on Room Air at Rest = 75%  Patient Saturations on Room Air while Ambulating = 75%  Patient Saturations on 4 Liters of oxygen while Ambulating = 91%  Please briefly explain why patient needs home oxygen:  [place] on room air

## 2022-07-05 DIAGNOSIS — E039 Hypothyroidism, unspecified: Secondary | ICD-10-CM | POA: Diagnosis not present

## 2022-07-05 DIAGNOSIS — Z6837 Body mass index (BMI) 37.0-37.9, adult: Secondary | ICD-10-CM | POA: Diagnosis not present

## 2022-07-05 DIAGNOSIS — J449 Chronic obstructive pulmonary disease, unspecified: Secondary | ICD-10-CM | POA: Diagnosis not present

## 2022-07-05 DIAGNOSIS — I10 Essential (primary) hypertension: Secondary | ICD-10-CM | POA: Diagnosis not present

## 2022-07-05 DIAGNOSIS — J1289 Other viral pneumonia: Secondary | ICD-10-CM | POA: Diagnosis not present

## 2022-08-02 DIAGNOSIS — E039 Hypothyroidism, unspecified: Secondary | ICD-10-CM | POA: Diagnosis not present

## 2022-08-02 DIAGNOSIS — Z125 Encounter for screening for malignant neoplasm of prostate: Secondary | ICD-10-CM | POA: Diagnosis not present

## 2022-08-02 DIAGNOSIS — R7309 Other abnormal glucose: Secondary | ICD-10-CM | POA: Diagnosis not present

## 2022-08-02 DIAGNOSIS — D518 Other vitamin B12 deficiency anemias: Secondary | ICD-10-CM | POA: Diagnosis not present

## 2022-08-02 DIAGNOSIS — Z0001 Encounter for general adult medical examination with abnormal findings: Secondary | ICD-10-CM | POA: Diagnosis not present

## 2022-08-02 DIAGNOSIS — E559 Vitamin D deficiency, unspecified: Secondary | ICD-10-CM | POA: Diagnosis not present

## 2022-08-08 ENCOUNTER — Ambulatory Visit (INDEPENDENT_AMBULATORY_CARE_PROVIDER_SITE_OTHER): Payer: Medicare Other | Admitting: Internal Medicine

## 2022-08-08 ENCOUNTER — Other Ambulatory Visit: Payer: Self-pay

## 2022-08-08 ENCOUNTER — Encounter (HOSPITAL_COMMUNITY): Payer: Self-pay | Admitting: Emergency Medicine

## 2022-08-08 ENCOUNTER — Observation Stay (HOSPITAL_COMMUNITY)
Admission: EM | Admit: 2022-08-08 | Discharge: 2022-08-09 | Disposition: A | Discharge status: Home / Self Care | Payer: Medicare Other | Attending: Internal Medicine | Admitting: Internal Medicine

## 2022-08-08 ENCOUNTER — Emergency Department (HOSPITAL_COMMUNITY): Payer: Medicare Other

## 2022-08-08 ENCOUNTER — Encounter: Payer: Self-pay | Admitting: Internal Medicine

## 2022-08-08 DIAGNOSIS — Z7982 Long term (current) use of aspirin: Secondary | ICD-10-CM | POA: Diagnosis not present

## 2022-08-08 DIAGNOSIS — Z8673 Personal history of transient ischemic attack (TIA), and cerebral infarction without residual deficits: Secondary | ICD-10-CM | POA: Insufficient documentation

## 2022-08-08 DIAGNOSIS — J449 Chronic obstructive pulmonary disease, unspecified: Secondary | ICD-10-CM | POA: Insufficient documentation

## 2022-08-08 DIAGNOSIS — D509 Iron deficiency anemia, unspecified: Secondary | ICD-10-CM | POA: Diagnosis not present

## 2022-08-08 DIAGNOSIS — R0602 Shortness of breath: Secondary | ICD-10-CM | POA: Diagnosis not present

## 2022-08-08 DIAGNOSIS — Z6834 Body mass index (BMI) 34.0-34.9, adult: Secondary | ICD-10-CM | POA: Insufficient documentation

## 2022-08-08 DIAGNOSIS — F17210 Nicotine dependence, cigarettes, uncomplicated: Secondary | ICD-10-CM | POA: Diagnosis not present

## 2022-08-08 DIAGNOSIS — J9601 Acute respiratory failure with hypoxia: Secondary | ICD-10-CM | POA: Diagnosis not present

## 2022-08-08 DIAGNOSIS — E669 Obesity, unspecified: Secondary | ICD-10-CM | POA: Diagnosis not present

## 2022-08-08 DIAGNOSIS — Z79899 Other long term (current) drug therapy: Secondary | ICD-10-CM | POA: Diagnosis not present

## 2022-08-08 DIAGNOSIS — R0609 Other forms of dyspnea: Secondary | ICD-10-CM

## 2022-08-08 DIAGNOSIS — Z8546 Personal history of malignant neoplasm of prostate: Secondary | ICD-10-CM | POA: Diagnosis not present

## 2022-08-08 DIAGNOSIS — K219 Gastro-esophageal reflux disease without esophagitis: Secondary | ICD-10-CM

## 2022-08-08 DIAGNOSIS — D649 Anemia, unspecified: Secondary | ICD-10-CM | POA: Diagnosis not present

## 2022-08-08 DIAGNOSIS — I10 Essential (primary) hypertension: Secondary | ICD-10-CM | POA: Diagnosis not present

## 2022-08-08 DIAGNOSIS — D631 Anemia in chronic kidney disease: Secondary | ICD-10-CM | POA: Diagnosis present

## 2022-08-08 DIAGNOSIS — J90 Pleural effusion, not elsewhere classified: Secondary | ICD-10-CM | POA: Diagnosis not present

## 2022-08-08 DIAGNOSIS — K760 Fatty (change of) liver, not elsewhere classified: Secondary | ICD-10-CM | POA: Diagnosis not present

## 2022-08-08 LAB — CBG MONITORING, ED: Glucose-Capillary: 125 mg/dL — ABNORMAL HIGH (ref 70–99)

## 2022-08-08 LAB — BASIC METABOLIC PANEL
Anion gap: 6 (ref 5–15)
BUN: 15 mg/dL (ref 8–23)
CO2: 35 mmol/L — ABNORMAL HIGH (ref 22–32)
Calcium: 8.7 mg/dL — ABNORMAL LOW (ref 8.9–10.3)
Chloride: 97 mmol/L — ABNORMAL LOW (ref 98–111)
Creatinine, Ser: 1.31 mg/dL — ABNORMAL HIGH (ref 0.61–1.24)
GFR, Estimated: 58 mL/min — ABNORMAL LOW (ref >60)
Glucose, Bld: 150 mg/dL — ABNORMAL HIGH (ref 70–99)
Potassium: 4 mmol/L (ref 3.5–5.1)
Sodium: 138 mmol/L (ref 135–145)

## 2022-08-08 LAB — URINALYSIS, ROUTINE W REFLEX MICROSCOPIC
Bilirubin Urine: NEGATIVE
Glucose, UA: NEGATIVE mg/dL
Hgb urine dipstick: NEGATIVE
Ketones, ur: NEGATIVE mg/dL
Leukocytes,Ua: NEGATIVE
Nitrite: NEGATIVE
Protein, ur: NEGATIVE mg/dL
Specific Gravity, Urine: 1.039 — ABNORMAL HIGH (ref 1.005–1.030)
pH: 6 (ref 5.0–8.0)

## 2022-08-08 LAB — CBC
HCT: 26.4 % — ABNORMAL LOW (ref 39.0–52.0)
Hemoglobin: 7.6 g/dL — ABNORMAL LOW (ref 13.0–17.0)
MCH: 24.4 pg — ABNORMAL LOW (ref 26.0–34.0)
MCHC: 28.8 g/dL — ABNORMAL LOW (ref 30.0–36.0)
MCV: 84.6 fL (ref 80.0–100.0)
Platelets: 326 10*3/uL (ref 150–400)
RBC: 3.12 MIL/uL — ABNORMAL LOW (ref 4.22–5.81)
RDW: 15.1 % (ref 11.5–15.5)
WBC: 9.2 10*3/uL (ref 4.0–10.5)
nRBC: 0 % (ref 0.0–0.2)

## 2022-08-08 LAB — BLOOD GAS, VENOUS
Acid-Base Excess: 13.4 mmol/L — ABNORMAL HIGH (ref 0.0–2.0)
Bicarbonate: 38.9 mmol/L — ABNORMAL HIGH (ref 20.0–28.0)
Drawn by: 66297
O2 Saturation: 84.4 %
Patient temperature: 36.8
pCO2, Ven: 56 mmHg (ref 44–60)
pH, Ven: 7.45 — ABNORMAL HIGH (ref 7.25–7.43)
pO2, Ven: 50 mmHg — ABNORMAL HIGH (ref 32–45)

## 2022-08-08 LAB — TROPONIN I (HIGH SENSITIVITY)
Troponin I (High Sensitivity): 11 ng/L (ref <18)
Troponin I (High Sensitivity): 11 ng/L (ref <18)

## 2022-08-08 LAB — BRAIN NATRIURETIC PEPTIDE: B Natriuretic Peptide: 155 pg/mL — ABNORMAL HIGH (ref 0.0–100.0)

## 2022-08-08 LAB — POC OCCULT BLOOD, ED: Fecal Occult Bld: NEGATIVE

## 2022-08-08 LAB — PREPARE RBC (CROSSMATCH)

## 2022-08-08 MED ORDER — FUROSEMIDE 10 MG/ML IJ SOLN
20.0000 mg | Freq: Once | INTRAMUSCULAR | Status: AC
  Administered 2022-08-08: 20 mg via INTRAVENOUS
  Filled 2022-08-08: qty 2

## 2022-08-08 MED ORDER — SODIUM CHLORIDE 0.9% IV SOLUTION: Freq: Once | INTRAVENOUS | Status: AC

## 2022-08-08 MED ORDER — ACETAMINOPHEN 650 MG RE SUPP: 650.0000 mg | Freq: Four times a day (QID) | RECTAL | Status: DC | PRN

## 2022-08-08 MED ORDER — PANTOPRAZOLE SODIUM 20 MG PO TBEC
20.0000 mg | DELAYED_RELEASE_TABLET | Freq: Every day | ORAL | Status: DC
  Filled 2022-08-08 (×4): qty 1

## 2022-08-08 MED ORDER — MORPHINE SULFATE (PF) 2 MG/ML IV SOLN: 2.0000 mg | INTRAVENOUS | Status: DC | PRN

## 2022-08-08 MED ORDER — IOHEXOL 350 MG/ML SOLN
100.0000 mL | Freq: Once | INTRAVENOUS | Status: AC | PRN
  Administered 2022-08-08: 100 mL via INTRAVENOUS

## 2022-08-08 MED ORDER — ONDANSETRON HCL 4 MG/2ML IJ SOLN: 4.0000 mg | Freq: Four times a day (QID) | INTRAMUSCULAR | Status: DC | PRN

## 2022-08-08 MED ORDER — ACETAMINOPHEN 325 MG PO TABS: 650.0000 mg | ORAL_TABLET | Freq: Four times a day (QID) | ORAL | Status: DC | PRN

## 2022-08-08 MED ORDER — IPRATROPIUM-ALBUTEROL 0.5-2.5 (3) MG/3ML IN SOLN: 3.0000 mL | RESPIRATORY_TRACT | Status: DC | PRN

## 2022-08-08 MED ORDER — OXYCODONE HCL 5 MG PO TABS: 5.0000 mg | ORAL_TABLET | ORAL | Status: DC | PRN

## 2022-08-08 MED ORDER — ONDANSETRON HCL 4 MG PO TABS: 4.0000 mg | ORAL_TABLET | Freq: Four times a day (QID) | ORAL | Status: DC | PRN

## 2022-08-08 MED ORDER — AMLODIPINE BESYLATE 5 MG PO TABS
10.0000 mg | ORAL_TABLET | Freq: Every day | ORAL | Status: DC
  Administered 2022-08-09: 10 mg via ORAL
  Filled 2022-08-08 (×2): qty 2

## 2022-08-08 NOTE — ED Notes (Signed)
Pt eating food from outside source; hamburger and milkshake.

## 2022-08-08 NOTE — ED Provider Notes (Signed)
[facility] EMERGENCY DEPARTMENT Provider Note  CSN: [id number] Arrival date & time: 08/08/22 1519  Chief Complaint(s) Shortness of Breath and Near Syncope  HPI Mark Cannon is a 71 y.o. male with PMH prostate cancer status post removal, HTN, HLD, COPD who presents emergency department for evaluation of shortness of breath and near syncope.  Patient recently admitted in the middle of September 2023 for COPD exacerbation with pneumonia and was unable to wean off of oxygen and was ultimately discharged with home oxygen.  Patient followed up with pulmonology and is having persistent shortness of breath on exertion, for which she was transferred to the emergency department for a PE rule out and evaluation of possible new onset heart failure.  Currently the patient denies chest pain, abdominal pain, nausea, vomiting or other systemic symptoms.  Endorses mild shortness of breath.   Past Medical History Past Medical History:  Diagnosis Date   Arthritis    Cancer [facility])    Prostate, has had prostate removed, has had radiation and still has cancer,   Colon polyps    Cough    GERD (gastroesophageal reflux disease)    occasional    H/O tooth extraction week of 03/15/2015    History of kidney stones    Hypercholesteremia    Hypertension    Lumbar stenosis with neurogenic claudication    Pneumonia    Pre-diabetes    Renal disorder    Sleep apnea    No cpap   Patient Active Problem List   Diagnosis Date Noted   DOE (dyspnea on exertion) 08/08/2022   Symptomatic anemia 08/08/2022   Pneumonia 06/27/2022   COPD with acute exacerbation ([place]) 06/27/2022   Acute respiratory failure with hypoxia ([place]) 06/27/2022   Stroke (cerebrum) ([place]) 05/14/2022   Rectal bleeding 07/16/2021   Personal history of colonic polyps    Diverticulosis of large intestine without diverticulitis    Lumbar stenosis with neurogenic claudication 06/01/2018   Prostate cancer ([place]) 03/22/2015   Home  Medication(s) Prior to Admission medications   Medication Sig Start Date End Date Taking? Authorizing Provider  amLODipine (NORVASC) 10 MG tablet Take 10 mg by mouth daily.   Yes [provider]  aspirin EC 81 MG tablet Take 1 tablet (81 mg total) by mouth daily. Swallow whole. 05/16/22  Yes Ghimire, Dante Gang, MD  nicotine (NICODERM CQ - DOSED IN MG/24 HOURS) 14 mg/24hr patch Place 14 mg onto the skin daily. 07/05/22  Yes [provider]  pantoprazole (PROTONIX) 20 MG tablet Take 20 mg by mouth daily. 05/20/22  Yes [provider]  DULoxetine (CYMBALTA) 60 MG capsule Take 60 mg by mouth daily. Patient not taking: Reported on 08/08/2022 07/05/22   [provider]  HYDROcodone-acetaminophen (NORCO) 10-325 MG tablet Take 1 tablet by mouth every 6 (six) hours. Patient not taking: Reported on 08/08/2022 05/16/22   [provider]  ipratropium-albuterol (DUONEB) 0.5-2.5 (3) MG/3ML SOLN Take 3 mLs by nebulization every 4 (four) hours while awake for 2 days, THEN 3 mLs every 4 (four) hours as needed. Patient not taking: Reported on 08/08/2022 06/30/22 08/08/22  Mariel Aloe, MD  Past Surgical History Past Surgical History:  Procedure Laterality Date   APPENDECTOMY     BACK SURGERY     CHOLECYSTECTOMY     COLONOSCOPY  02/04/2012   Procedure: COLONOSCOPY;  Surgeon: Jamesetta So, MD;  [facility];  Service: Gastroenterology;  Laterality: N/A;   COLONOSCOPY N/A 09/22/2018   Procedure: COLONOSCOPY;  Surgeon: Aviva Signs, MD;  Location: [facility];  Service: Gastroenterology;  Laterality: N/A;   COLONOSCOPY W/ POLYPECTOMY     HERNIA REPAIR     Bilateral inguinal hernia repair X 4   hydrocelectomy     left knee arthroscopy     LITHOTRIPSY     LYMPHADENECTOMY Bilateral 03/22/2015   Procedure: PELVIC LYMPHADENECTOMY;   Surgeon: Alexis Frock, MD;  Location: [facility];  Service: Urology;  Laterality: Bilateral;   NM MYOVIEW LTD  2011   POLYPECTOMY  09/22/2018   Procedure: POLYPECTOMY;  Surgeon: Aviva Signs, MD;  Location: [facility];  Service: Gastroenterology;;   [place] N/A 03/22/2015   Procedure: ROBOTIC ASSISTED LAPAROSCOPIC RADICAL PROSTATECTOMY WITH INDOCYANINE GREEN DYE INJECTION;  Surgeon: Alexis Frock, MD;  Location: [facility];  Service: Urology;  Laterality: N/A;   ROBOTIC ASSISTED LAPAROSCOPIC LYSIS OF ADHESION  03/22/2015   Procedure: ROBOTIC ASSISTED LAPAROSCOPIC LYSIS OF ADHESION;  Surgeon: Alexis Frock, MD;  Location: [facility];  Service: Urology;;   SHOULDER SURGERY Right    VASECTOMY     Family History Family History  Problem Relation Age of Onset   Heart failure Father    Stroke Father    Colon cancer Neg Hx     Social History Social History   Tobacco Use   Smoking status: Every Day    Packs/day: 1.00    Years: 55.00    Total pack years: 55.00    Types: Cigarettes   Smokeless tobacco: Never  Vaping Use   Vaping Use: Never used  Substance Use Topics   Alcohol use: Yes    Alcohol/week: 1.0 standard drink of alcohol    Types: 1 Glasses of wine per week    Comment: one glass of wine once a month   Drug use: No   Allergies Lidocaine, Benzocaine, and Other  Review of Systems Review of Systems  Respiratory:  Positive for shortness of breath.     Physical Exam Vital Signs  I have reviewed the triage vital signs BP 132/62   Pulse 83   Temp 98.1 F (36.7 C) (Oral)   Resp 11   Ht '5\' 8"'$  (1.727 m)   Wt 104.1 kg   SpO2 97%   BMI 34.90 kg/m   Physical Exam Constitutional:      General: He is not in acute distress.    Appearance: Normal appearance.  HENT:     Head: Normocephalic and atraumatic.     Nose: No congestion or rhinorrhea.  Eyes:     General:        Right eye: No discharge.        Left eye: No discharge.      Extraocular Movements: Extraocular movements intact.     Pupils: Pupils are equal, round, and reactive to light.  Cardiovascular:     Rate and Rhythm: Normal rate and regular rhythm.     Heart sounds: No murmur heard. Pulmonary:     Effort: No respiratory distress.     Breath sounds: No wheezing or rales.  Abdominal:     General: There is no distension.  Tenderness: There is no abdominal tenderness.  Musculoskeletal:        General: Normal range of motion.     Cervical back: Normal range of motion.  Skin:    General: Skin is warm and dry.  Neurological:     General: No focal deficit present.     Mental Status: He is alert.     ED Results and Treatments Labs (all labs ordered are listed, but only abnormal results are displayed) Labs Reviewed  BASIC METABOLIC PANEL - Abnormal; Notable for the following components:      Result Value   Chloride 97 (*)    CO2 35 (*)    Glucose, Bld 150 (*)    Creatinine, Ser 1.31 (*)    Calcium 8.7 (*)    GFR, Estimated 58 (*)    All other components within normal limits  CBC - Abnormal; Notable for the following components:   RBC 3.12 (*)    Hemoglobin 7.6 (*)    HCT 26.4 (*)    MCH 24.4 (*)    MCHC 28.8 (*)    All other components within normal limits  URINALYSIS, ROUTINE W REFLEX MICROSCOPIC - Abnormal; Notable for the following components:   Specific Gravity, Urine 1.039 (*)    All other components within normal limits  BRAIN NATRIURETIC PEPTIDE - Abnormal; Notable for the following components:   B Natriuretic Peptide 155.0 (*)    All other components within normal limits  BLOOD GAS, VENOUS - Abnormal; Notable for the following components:   pH, Ven 7.45 (*)    pO2, Ven 50 (*)    Bicarbonate 38.9 (*)    Acid-Base Excess 13.4 (*)    All other components within normal limits  CBG MONITORING, ED - Abnormal; Notable for the following components:   Glucose-Capillary 125 (*)    All other components within normal limits  VITAMIN  B12  FOLATE  IRON AND TIBC  FERRITIN  RETICULOCYTES  COMPREHENSIVE METABOLIC PANEL  MAGNESIUM  CBC WITH DIFFERENTIAL/PLATELET  POC OCCULT BLOOD, ED  TYPE AND SCREEN  PREPARE RBC (CROSSMATCH)  TROPONIN I (HIGH SENSITIVITY)  TROPONIN I (HIGH SENSITIVITY)                                                                                                                          Radiology CT Angio Chest PE W and/or Wo Contrast  Result Date: 08/08/2022 CLINICAL DATA:  Concern for pulmonary embolism. EXAM: CT ANGIOGRAPHY CHEST WITH CONTRAST TECHNIQUE: Multidetector CT imaging of the chest was performed using the standard protocol during bolus administration of intravenous contrast. Multiplanar CT image reconstructions and MIPs were obtained to evaluate the vascular anatomy. RADIATION DOSE REDUCTION: This exam was performed according to the departmental dose-optimization program which includes automated exposure control, adjustment of the mA and/or kV according to patient size and/or use of iterative reconstruction technique. CONTRAST:  145m OMNIPAQUE IOHEXOL 350 MG/ML SOLN COMPARISON:  Chest radiograph dated 06/27/2022. FINDINGS: Evaluation of this  exam is limited due to respiratory motion artifact. Cardiovascular: There is no cardiomegaly or pericardial effusion. There is 3 vessel coronary vascular calcification. Moderate calcified and noncalcified plaque of the thoracic aorta. No aneurysmal dilatation or dissection. The origins of the great vessels of the aortic arch appear patent as visualized. No pulmonary artery embolus identified. Mediastinum/Nodes: No hilar or mediastinal adenopathy. The esophagus is grossly unremarkable. No mediastinal fluid collection. There is mediastinal lipomatosis. Lungs/Pleura: There is partial consolidation of the lingula with air bronchogram which may represent atelectasis. Developing infiltrate or underlying mass is not entirely excluded. Follow-up to resolution  recommended. Small bilateral pleural effusions present. There is no pneumothorax. The central airways are patent. Upper Abdomen: Fatty liver.  Cholecystectomy. Musculoskeletal: Degenerative changes of the spine. Partially visualized cervical ACDF. No acute osseous pathology. Review of the MIP images confirms the above findings. IMPRESSION: 1. No CT evidence of pulmonary artery embolus. 2. Partial lingular atelectasis or infiltrate. 3. Small bilateral pleural effusions. 4. Fatty liver. 5.  Aortic Atherosclerosis (ICD10-I70.0). Electronically Signed   By: Anner Crete M.D.   On: 08/08/2022 17:46    Pertinent labs & imaging results that were available during my care of the patient were reviewed by me and considered in my medical decision making (see MDM for details).  Medications Ordered in ED Medications  0.9 %  sodium chloride infusion (Manually program via Guardrails IV Fluids) (has no administration in time range)  amLODipine (NORVASC) tablet 10 mg (has no administration in time range)  pantoprazole (PROTONIX) EC tablet 20 mg (has no administration in time range)  ipratropium-albuterol (DUONEB) 0.5-2.5 (3) MG/3ML nebulizer solution 3 mL (has no administration in time range)  acetaminophen (TYLENOL) tablet 650 mg (has no administration in time range)    Or  acetaminophen (TYLENOL) suppository 650 mg (has no administration in time range)  oxyCODONE (Oxy IR/ROXICODONE) immediate release tablet 5 mg (has no administration in time range)  morphine (PF) 2 MG/ML injection 2 mg (has no administration in time range)  ondansetron (ZOFRAN) tablet 4 mg (has no administration in time range)    Or  ondansetron (ZOFRAN) injection 4 mg (has no administration in time range)  iohexol (OMNIPAQUE) 350 MG/ML injection 100 mL (100 mLs Intravenous Contrast Given 08/08/22 1729)  furosemide (LASIX) injection 20 mg (20 mg Intravenous Given 08/08/22 2006)                                                                                                                                      Procedures .Critical Care  Performed by: Teressa Lower, MD Authorized by: Teressa Lower, MD   Critical care provider statement:    Critical care time (minutes):  30   Critical care was necessary to treat or prevent imminent or life-threatening deterioration of the following conditions: Symptomatic anemia requiring blood transfusion.   Critical care was time spent personally by me on the following activities:  Development  of treatment plan with patient or surrogate, discussions with consultants, evaluation of patient's response to treatment, examination of patient, ordering and review of laboratory studies, ordering and review of radiographic studies, ordering and performing treatments and interventions, pulse oximetry, re-evaluation of patient's condition and review of old charts   (including critical care time)  Medical Decision Making / ED Course   This patient presents to the ED for concern of shortness of breath, dizziness, this involves an extensive number of treatment options, and is a complaint that carries with it a high risk of complications and morbidity.  The differential diagnosis includes CHF exacerbation, COPD, PE, pneumonia, symptomatic anemia  MDM: Patient seen the emergency room for evaluation of exertional shortness of breath and dizziness.  Physical exam largely unremarkable.  Laboratory evaluation with a pH of 7.45 with no hypercarbia, creatinine 1.31.  He does have a mildly elevated BNP to 155 but most concerning is a significant hemoglobin drop from 11.0 to 7.6.  CT angio PE study without evidence of pulmonary embolism, shows small bilateral pleural effusions and partial lingular atelectasis.  Patient was Hemoccult negative on rectal exam and we attempted to perform a walking ambulatory pulse ox trial.  During this, patient had significant dizziness and was unable to continue.  Due to suspected  symptomatic anemia, we we will trial a single unit of packed red blood cells and monitoring for improvement.  Patient will require hospital admission as he is unable to ambulate without significant symptoms.  Patient then admitted.   Additional history obtained: -Additional history obtained from wife -External records from outside source obtained and reviewed including: Chart review including previous notes, labs, imaging, consultation notes   Lab Tests: -I ordered, reviewed, and interpreted labs.   The pertinent results include:   Labs Reviewed  BASIC METABOLIC PANEL - Abnormal; Notable for the following components:      Result Value   Chloride 97 (*)    CO2 35 (*)    Glucose, Bld 150 (*)    Creatinine, Ser 1.31 (*)    Calcium 8.7 (*)    GFR, Estimated 58 (*)    All other components within normal limits  CBC - Abnormal; Notable for the following components:   RBC 3.12 (*)    Hemoglobin 7.6 (*)    HCT 26.4 (*)    MCH 24.4 (*)    MCHC 28.8 (*)    All other components within normal limits  URINALYSIS, ROUTINE W REFLEX MICROSCOPIC - Abnormal; Notable for the following components:   Specific Gravity, Urine 1.039 (*)    All other components within normal limits  BRAIN NATRIURETIC PEPTIDE - Abnormal; Notable for the following components:   B Natriuretic Peptide 155.0 (*)    All other components within normal limits  BLOOD GAS, VENOUS - Abnormal; Notable for the following components:   pH, Ven 7.45 (*)    pO2, Ven 50 (*)    Bicarbonate 38.9 (*)    Acid-Base Excess 13.4 (*)    All other components within normal limits  CBG MONITORING, ED - Abnormal; Notable for the following components:   Glucose-Capillary 125 (*)    All other components within normal limits  VITAMIN B12  FOLATE  IRON AND TIBC  FERRITIN  RETICULOCYTES  COMPREHENSIVE METABOLIC PANEL  MAGNESIUM  CBC WITH DIFFERENTIAL/PLATELET  POC OCCULT BLOOD, ED  TYPE AND SCREEN  PREPARE RBC (CROSSMATCH)  TROPONIN I  (HIGH SENSITIVITY)  TROPONIN I (HIGH SENSITIVITY)      EKG  EKG Interpretation  Date/Time:  Thursday August 08 2022 15:45:21 EDT Ventricular Rate:  79 PR Interval:  198 QRS Duration: 79 QT Interval:  372 QTC Calculation: 427 R Axis:   78 Text Interpretation: Sinus rhythm Low voltage, precordial leads Confirmed by [place] ([id number]) on 08/08/2022 11:20:47 PM         Imaging Studies ordered: I ordered imaging studies including CT PE I independently visualized and interpreted imaging. I agree with the radiologist interpretation   Medicines ordered and prescription drug management: Meds ordered this encounter  Medications   iohexol (OMNIPAQUE) 350 MG/ML injection 100 mL   furosemide (LASIX) injection 20 mg   0.9 %  sodium chloride infusion (Manually program via Guardrails IV Fluids)   amLODipine (NORVASC) tablet 10 mg   pantoprazole (PROTONIX) EC tablet 20 mg   ipratropium-albuterol (DUONEB) 0.5-2.5 (3) MG/3ML nebulizer solution 3 mL   OR Linked Order Group    acetaminophen (TYLENOL) tablet 650 mg    acetaminophen (TYLENOL) suppository 650 mg   oxyCODONE (Oxy IR/ROXICODONE) immediate release tablet 5 mg   morphine (PF) 2 MG/ML injection 2 mg   OR Linked Order Group    ondansetron (ZOFRAN) tablet 4 mg    ondansetron (ZOFRAN) injection 4 mg    -I have reviewed the patients home medicines and have made adjustments as needed  Critical interventions Symptomatic anemia requiring blood transfusion   Cardiac Monitoring: The patient was maintained on a cardiac monitor.  I personally viewed and interpreted the cardiac monitored which showed an underlying rhythm of: NSR  Social Determinants of Health:  Factors impacting patients care include: none   Reevaluation: After the interventions noted above, I reevaluated the patient and found that they have :stayed the same  Co morbidities that complicate the patient evaluation  Past Medical History:  Diagnosis Date    Arthritis    Cancer [facility])    Prostate, has had prostate removed, has had radiation and still has cancer,   Colon polyps    Cough    GERD (gastroesophageal reflux disease)    occasional    H/O tooth extraction week of 03/15/2015    History of kidney stones    Hypercholesteremia    Hypertension    Lumbar stenosis with neurogenic claudication    Pneumonia    Pre-diabetes    Renal disorder    Sleep apnea    No cpap      Dispostion: I considered admission for this patient, and due to symptomatic anemia interfering with ability to ambulate, patient require hospital admission     Final Clinical Impression(s) / ED Diagnoses Final diagnoses:  None     '@PCDICTATION'$ @    Teressa Lower, MD 08/08/22 2321

## 2022-08-08 NOTE — ED Triage Notes (Signed)
Patient c/o dizziness x 2 days.  Patient seen at PCP today and sent from there because "he came close to passing out." PCP is concerned patient may have a PE.

## 2022-08-08 NOTE — ED Notes (Signed)
Lab at bedside

## 2022-08-08 NOTE — Patient Instructions (Signed)
Go to er for evaluation for PE

## 2022-08-08 NOTE — Progress Notes (Signed)
Mark Cannon, male    DOB: 22-Mar-1951    MRN: [id number]   Brief patient profile:  67   yowm  active smoker / wife is RN  referred to pulmonary clinic in [place]  08/08/2022 by [facility]  for f/u pna and 02 dependence. Had previously eval by [name] with dx of COPD but never pfts nor need for resp meds though very sedentary due to back pain     Admit date:     06/27/2022  Discharge date: 06/30/22       Discharge Diagnoses:     Pneumonia   Prostate cancer [facility])   Stroke (cerebrum) ([place])   COPD with acute exacerbation ([place])   Acute respiratory failure with hypoxia [facility])     Hospital Course: STYLES FAMBRO is a 71 y.o. male with a history of hypertension, hyperlipidemia, OSA, CKD stage IIIa, tobacco use, lumbar stenosis, prostate cancer s/p prostatectomy, CVA. Patient presented secondary to shortness of breath and was found to have evidence of pneumonia and COPD exacerbation in addition to hypoxia. Ceftriaxone/azithromycin, prednisone and supplemental oxygen started. Patient to continue antibiotics and prednisone. Discharge on oxygen. Recommendation for pulmonology follow-up.   Assessment and Plan:   Acute respiratory failure with hypoxia Patient is officially on room air, however it appears he may have qualified for chronic oxygen use on previous admission. Current hypoxia in setting of pneumonia and COPD exacerbation. Requiring 4 L/min with ambulation; outpatient oxygen set up for discharge.   Community acquired pneumonia Chest x-ray significant for left lingular opacity with concern for pneumonia. Patient started on Ceftriaxone and azithromycin for empiric treatment. Patient transitioned to Cefdinir and azithromycin to complete course.   COPD exacerbation Unsure of etiology for exacerbation. Associated sputum production. Patient started on steroids in addition to antibiotics as mentioned above. Patient discharged to complete prednisone burst. New prescription for Duoneb placed.  Antibiotics as mentioned above. Recommendation for outpatient pulmonology follow-up.   History of CVA Associated right central vision loss. Intolerant to statin therapy. Continue aspirin.   Hyperlipidemia Intolerant to statin therapy.   AKI on CKD stage IIIa Baseline creatinine of about 1.5. Creatinine up to a peak of 1.99. Complicated by hypotension and olmesartan/hydrochlorothiazide use as an outpatient. NS IV fluid bolus given on 9/16 with resultant creatinine of 1.48 on discharge. Recommend repeat BMP.   Primary hypertension Patient is on hydrochlorothiazide, olmesartan and amlodipine as an outpatient. Hydrochlorothiazide and olmesartan held secondary to AKI. Recommendation to hold on discharge.   Elevated troponin Mildly elevated. Not consistent with ACS.   Tobacco use Patient counseled on cessation.   OSA Not on CPAP secondary to intolerance.   Obesity   Height as of this encounter: '5\' 8"'$  (1.727 m).   Weight as of this encounter: 106.6 kg.    History of Present Illness  08/08/2022  Pulmonary/ 1st office eval/ [facility] / [facility]  Chief Complaint  Patient presents with   Consult    Copd exacerbation and pneumonia of LL lobe  Uses 3LO2 cont 24/7   Dyspnea:  back to baseline but 02 at 3 (was 4 lpm)  Cough: none  Sleep: flat  SABA use: has duoneb not using  02 3lpm  Presyncope since d/c  Chronic L > R leg swelling no change   No obvious day to day or daytime pattern/variability or assoc excess/ purulent sputum or mucus plugs or hemoptysis or cp or chest tightness, subjective wheeze or overt sinus or hb symptoms.   Sleeping ok  without  nocturnal  or early am exacerbation  of respiratory  c/o's or need for noct saba. Also denies any obvious fluctuation of symptoms with weather or environmental changes or other aggravating or alleviating factors except as outlined above   No unusual exposure hx or h/o childhood pna/ asthma or knowledge of premature  birth.  Current Allergies, Complete Past Medical History, Past Surgical History, Family History, and Social History were reviewed in [employer] record.  ROS  The following are not active complaints unless bolded Hoarseness, sore throat, dysphagia, dental problems, itching, sneezing,  nasal congestion or discharge of excess mucus or purulent secretions, ear ache,   fever, chills, sweats, unintended wt loss or wt gain, classically pleuritic or exertional cp,  orthopnea pnd or arm/hand swelling  or leg swelling, presyncope, palpitations, abdominal pain, anorexia, nausea, vomiting, diarrhea  or change in bowel habits or change in bladder habits, change in stools or change in urine, dysuria, hematuria,  rash, arthralgias, visual complaints, headache, numbness, weakness or ataxia or problems with walking or coordination,  change in mood or  memory.          Past Medical History:  Diagnosis Date   Arthritis    Cancer [facility])    Prostate, has had prostate removed, has had radiation and still has cancer,   Colon polyps    Cough    GERD (gastroesophageal reflux disease)    occasional    H/O tooth extraction week of 03/15/2015    History of kidney stones    Hypercholesteremia    Hypertension    Lumbar stenosis with neurogenic claudication    Pneumonia    Pre-diabetes    Renal disorder    Sleep apnea    No cpap    Outpatient Medications Prior to Visit  Medication Sig Dispense Refill   amLODipine (NORVASC) 10 MG tablet Take 10 mg by mouth daily.     aspirin EC 81 MG tablet Take 1 tablet (81 mg total) by mouth daily. Swallow whole. 30 tablet 12   HYDROcodone-acetaminophen (NORCO) 10-325 MG tablet Take 1 tablet by mouth every 6 (six) hours.     pantoprazole (PROTONIX) 20 MG tablet Take 20 mg by mouth daily.     ipratropium-albuterol (DUONEB) 0.5-2.5 (3) MG/3ML SOLN Take 3 mLs by nebulization every 4 (four) hours while awake for 2 days, THEN 3 mLs every 4 (four) hours as  needed. 360 mL 2   No facility-administered medications prior to visit.     Objective:     BP 128/72 (BP Location: Left Arm, Patient Position: Sitting)   Pulse 92   Temp 98.4 F (36.9 C) (Temporal)   Ht '5\' 8"'$  (1.727 m)   Wt 229 lb 6.4 oz (104.1 kg)   SpO2 90% Comment: 3LO2 CONT  BMI 34.88 kg/m   SpO2: 90 % (3LO2 CONT)  Obese amb wm nad    HEENT : Oropharynx  clear      Nasal turbinates nl    NECK :  without  apparent JVD/ palpable Nodes/TM    LUNGS: no acc muscle use,  Nl contour chest distant bs  bilaterally without cough on insp or exp maneuvers   CV:  RRR  no s3 or murmur or increase in P2, and no edema   ABD:  soft and nontender with nl inspiratory excursion in the supine position. No bruits or organomegaly appreciated   MS:  Nl gait/ ext warm without deformities Or obvious joint restrictions  calf tenderness, cyanosis or  clubbing    SKIN: warm and dry without lesions    NEURO:  alert, approp, nl sensorium with  no motor or cerebellar deficits apparent.     I personally reviewed images and agree with radiology impression as follows:  CXR:  portable 06/27/22  Mild left lingular opacity is noted concerning for pneumonia or atelectasis. Followup PA and lateral chest X-ray is recommended in 3-4 weeks following trial of antibiotic therapy to ensure resolution and exclude underlying malignancy  Labs ordered/ reviewed:      Chemistry      Component Value Date/Time   NA 136 06/30/2022 0528   K 5.0 06/30/2022 1158   CL 94 (L) 06/30/2022 0528   CO2 36 (H) 06/30/2022 0528   BUN 37 (H) 06/30/2022 0528   CREATININE 1.48 (H) 06/30/2022 0528      Component Value Date/Time   CALCIUM 8.4 (L) 06/30/2022 0528   ALKPHOS 66 03/25/2019 2126   AST 21 03/25/2019 2126   ALT 31 03/25/2019 2126   BILITOT 0.4 03/25/2019 2126        Lab Results  Component Value Date   WBC 11.5 (H) 06/28/2022   HGB 11.0 (L) 06/28/2022   HCT 38.4 (L) 06/28/2022   MCV 89.1 06/28/2022    PLT 314 06/28/2022              Assessment   DOE (dyspnea on exertion) Onset Sept 06/20/22 with bad cough and new hypoxemia at rest  With pressyncope and clear lungs on exam   >>> w/u for chf/PE expedite > to ER  Discussed in detail all the  indications, usual  risks and alternatives  relative to the benefits with patient who agrees to proceed with w/u as outlined.                   Acute respiratory failure with hypoxia (HCC) Also has hypercarbic component but new onset presyncope and persistent hypoxemia since d/c worrisome for PE   Pt developed presyncope in office as tried to get up to go to lab /cxr so rec he go to ER for expedited w/u as high risk for PE   Each maintenance medication was reviewed in detail including emphasizing most importantly the difference between maintenance and prns and under what circumstances the prns are to be triggered using an action plan format where appropriate.  Total time for H and P, chart review, counseling, reviewing 02 device(s) and generating customized AVS unique to this office visit / same day charting  > 45 min new pt eval      Christinia Gully, MD 08/08/2022

## 2022-08-08 NOTE — ED Notes (Signed)
Pt ambulated with 3L of o2 at this time pt stayed at 94 percent but did have to stop and set down pt stated that he was very dizzy at this time. Niamya Vittitow

## 2022-08-08 NOTE — ED Notes (Signed)
Pharmacy at bedside

## 2022-08-08 NOTE — Assessment & Plan Note (Addendum)
Onset Sept 06/20/22 with bad cough and new hypoxemia at rest  With pressyncope and clear lungs on exam   >>> w/u for chf/PE expedite > to ER  Discussed in detail all the  indications, usual  risks and alternatives  relative to the benefits with patient who agrees to proceed with w/u as outlined.

## 2022-08-08 NOTE — Assessment & Plan Note (Addendum)
Also has hypercarbic component but new onset presyncope and persistent hypoxemia since d/c worrisome for PE   Pt developed presyncope in office as tried to get up to go to lab /cxr so rec he go to ER for expedited w/u as high risk for PE   Each maintenance medication was reviewed in detail including emphasizing most importantly the difference between maintenance and prns and under what circumstances the prns are to be triggered using an action plan format where appropriate.  Total time for H and P, chart review, counseling, reviewing 02 device(s) and generating customized AVS unique to this office visit / same day charting  > 45 min new pt eval

## 2022-08-09 ENCOUNTER — Telehealth: Payer: Self-pay | Admitting: Gastroenterology

## 2022-08-09 ENCOUNTER — Encounter (HOSPITAL_COMMUNITY): Payer: Self-pay | Admitting: Family Medicine

## 2022-08-09 DIAGNOSIS — J9601 Acute respiratory failure with hypoxia: Secondary | ICD-10-CM

## 2022-08-09 DIAGNOSIS — J441 Chronic obstructive pulmonary disease with (acute) exacerbation: Secondary | ICD-10-CM

## 2022-08-09 DIAGNOSIS — K219 Gastro-esophageal reflux disease without esophagitis: Secondary | ICD-10-CM

## 2022-08-09 DIAGNOSIS — D509 Iron deficiency anemia, unspecified: Secondary | ICD-10-CM

## 2022-08-09 DIAGNOSIS — E66812 Obesity, class 2: Secondary | ICD-10-CM | POA: Diagnosis present

## 2022-08-09 DIAGNOSIS — D649 Anemia, unspecified: Secondary | ICD-10-CM | POA: Diagnosis not present

## 2022-08-09 DIAGNOSIS — E669 Obesity, unspecified: Secondary | ICD-10-CM | POA: Diagnosis present

## 2022-08-09 DIAGNOSIS — I10 Essential (primary) hypertension: Secondary | ICD-10-CM | POA: Diagnosis not present

## 2022-08-09 LAB — IRON AND TIBC
Iron: 44 ug/dL — ABNORMAL LOW (ref 45–182)
Saturation Ratios: 10 % — ABNORMAL LOW (ref 17.9–39.5)
TIBC: 449 ug/dL (ref 250–450)
UIBC: 405 ug/dL

## 2022-08-09 LAB — TYPE AND SCREEN
ABO/RH(D): A POS
Antibody Screen: NEGATIVE
Unit division: 0

## 2022-08-09 LAB — COMPREHENSIVE METABOLIC PANEL
ALT: 25 U/L (ref 0–44)
AST: 18 U/L (ref 15–41)
Albumin: 3.4 g/dL — ABNORMAL LOW (ref 3.5–5.0)
Alkaline Phosphatase: 64 U/L (ref 38–126)
Anion gap: 8 (ref 5–15)
BUN: 15 mg/dL (ref 8–23)
CO2: 36 mmol/L — ABNORMAL HIGH (ref 22–32)
Calcium: 9.2 mg/dL (ref 8.9–10.3)
Chloride: 96 mmol/L — ABNORMAL LOW (ref 98–111)
Creatinine, Ser: 1.15 mg/dL (ref 0.61–1.24)
GFR, Estimated: >60 mL/min (ref >60)
Glucose, Bld: 165 mg/dL — ABNORMAL HIGH (ref 70–99)
Potassium: 3.9 mmol/L (ref 3.5–5.1)
Sodium: 140 mmol/L (ref 135–145)
Total Bilirubin: 0.4 mg/dL (ref 0.3–1.2)
Total Protein: 6.8 g/dL (ref 6.5–8.1)

## 2022-08-09 LAB — RETICULOCYTES
Immature Retic Fract: 27.1 % — ABNORMAL HIGH (ref 2.3–15.9)
RBC.: 3.59 MIL/uL — ABNORMAL LOW (ref 4.22–5.81)
Retic Count, Absolute: 87.6 10*3/uL (ref 19.0–186.0)
Retic Ct Pct: 2.4 % (ref 0.4–3.1)

## 2022-08-09 LAB — CBC WITH DIFFERENTIAL/PLATELET
Abs Immature Granulocytes: 0.05 10*3/uL (ref 0.00–0.07)
Basophils Absolute: 0.1 10*3/uL (ref 0.0–0.1)
Basophils Relative: 1 %
Eosinophils Absolute: 0.5 10*3/uL (ref 0.0–0.5)
Eosinophils Relative: 6 %
HCT: 29.8 % — ABNORMAL LOW (ref 39.0–52.0)
Hemoglobin: 8.9 g/dL — ABNORMAL LOW (ref 13.0–17.0)
Immature Granulocytes: 1 %
Lymphocytes Relative: 18 %
Lymphs Abs: 1.7 10*3/uL (ref 0.7–4.0)
MCH: 24.9 pg — ABNORMAL LOW (ref 26.0–34.0)
MCHC: 29.9 g/dL — ABNORMAL LOW (ref 30.0–36.0)
MCV: 83.2 fL (ref 80.0–100.0)
Monocytes Absolute: 0.6 10*3/uL (ref 0.1–1.0)
Monocytes Relative: 6 %
Neutro Abs: 6.6 10*3/uL (ref 1.7–7.7)
Neutrophils Relative %: 68 %
Platelets: 301 10*3/uL (ref 150–400)
RBC: 3.58 MIL/uL — ABNORMAL LOW (ref 4.22–5.81)
RDW: 14.9 % (ref 11.5–15.5)
WBC: 9.5 10*3/uL (ref 4.0–10.5)
nRBC: 0 % (ref 0.0–0.2)

## 2022-08-09 LAB — BPAM RBC
Blood Product Expiration Date: 202311242359
ISSUE DATE / TIME: 202310262213
Unit Type and Rh: 6200

## 2022-08-09 LAB — FOLATE: Folate: 6.8 ng/mL (ref >5.9)

## 2022-08-09 LAB — FERRITIN: Ferritin: 11 ng/mL — ABNORMAL LOW (ref 24–336)

## 2022-08-09 LAB — VITAMIN B12: Vitamin B-12: 262 pg/mL (ref 180–914)

## 2022-08-09 LAB — MAGNESIUM: Magnesium: 2 mg/dL (ref 1.7–2.4)

## 2022-08-09 MED ORDER — PANTOPRAZOLE SODIUM 40 MG PO TBEC
40.0000 mg | DELAYED_RELEASE_TABLET | Freq: Every day | ORAL | Status: DC
  Administered 2022-08-09: 40 mg via ORAL
  Filled 2022-08-09: qty 1

## 2022-08-09 MED ORDER — PANTOPRAZOLE SODIUM 40 MG PO TBEC: 40.0000 mg | DELAYED_RELEASE_TABLET | Freq: Every day | ORAL | 2 refills | Status: AC

## 2022-08-09 MED ORDER — SODIUM CHLORIDE 0.9 % IV SOLN
500.0000 mg | Freq: Once | INTRAVENOUS | Status: AC
  Administered 2022-08-09: 500 mg via INTRAVENOUS
  Filled 2022-08-09: qty 25

## 2022-08-09 MED ORDER — IPRATROPIUM-ALBUTEROL 0.5-2.5 (3) MG/3ML IN SOLN: 3.0000 mL | Freq: Four times a day (QID) | RESPIRATORY_TRACT | 2 refills | Status: DC | PRN

## 2022-08-09 MED ORDER — CYANOCOBALAMIN 1000 MCG/ML IJ SOLN
1000.0000 ug | Freq: Once | INTRAMUSCULAR | Status: AC
  Administered 2022-08-09: 1000 ug via INTRAMUSCULAR
  Filled 2022-08-09: qty 1

## 2022-08-09 NOTE — Assessment & Plan Note (Addendum)
-  Hemoglobin down to 7.6 - Latest hemoglobin about 1 month ago around 11 - Excellent response to 1 unit PRBC transfusion during hospitalization -At discharge hemoglobin 8.9  -No signs of overt bleeding appreciated, patient's symptoms completely improved/resolved. -Case discussed with gastroenterology who recommended outpatient work-up for anemia and endoscopic evaluation if needed.   -Continue PPI and avoid any NSAIDs outside the use of baby enteric-coated aspirin.   -IV iron x1 also provided while inpatient as anemia panel demonstrated component of iron deficiency.

## 2022-08-09 NOTE — H&P (Signed)
History and Physical    Patient: Mark Cannon XBD:[id number] DOB: 10-26-1950 DOA: 08/08/2022 DOS: the patient was seen and examined on 08/09/2022 PCP: Jake Samples, PA-C  Patient coming from: Home  Chief Complaint:  Chief Complaint  Patient presents with   Shortness of Breath   Near Syncope   HPI: Mark Cannon is a 71 y.o. male with medical history significant of arthritis, cancer, GERD, hyperlipidemia, hypertension presents to the ED with a chief complaint of difficulty breathing.  Patient reports that has been gradually in onset over the last 2 weeks.  The dyspnea is exertional.  His oxygen sats dropped down to 75% while he is exerting himself.  Sitting down situating his oxygen and trying to rest makes it better.  He reports the lowest oxygen setting he is seen is 65% that was just prior to his previous hospitalization.  Patient reports no fevers, no cough.  He is wearing 4 L nasal cannula at baseline at home.  He reports he is not using his nebulizer because he ran out of medication for it.  He does not have any chest pains.  He does have some dizziness.  He has had no loss of consciousness.  No change in vision.  He reports he does have a ringing in his ear sometimes.  Patient reports that he did quit smoking last month.  He has orthopnea at home that he reports is resolved but laying on his left or right side.  He reports no peripheral edema.  Patient reports a family history with his mother dying of "low hemoglobin."  According to patient, mother had been anemic for the last couple years of her life and had to have recurrent transfusions and there was never any etiology identified.  Patient does not smoke, does not drink, does not use illicit drugs.  He is vaccinated for COVID.  Patient is full code.   review of Systems: As mentioned in the history of present illness. All other systems reviewed and are negative. Past Medical History:  Diagnosis Date   Arthritis    Cancer  [facility])    Prostate, has had prostate removed, has had radiation and still has cancer,   Colon polyps    Cough    GERD (gastroesophageal reflux disease)    occasional    H/O tooth extraction week of 03/15/2015    History of kidney stones    Hypercholesteremia    Hypertension    Lumbar stenosis with neurogenic claudication    Pneumonia    Pre-diabetes    Renal disorder    Sleep apnea    No cpap   Past Surgical History:  Procedure Laterality Date   APPENDECTOMY     BACK SURGERY     CHOLECYSTECTOMY     COLONOSCOPY  02/04/2012   Procedure: COLONOSCOPY;  Surgeon: Jamesetta So, MD;  [facility];  Service: Gastroenterology;  Laterality: N/A;   COLONOSCOPY N/A 09/22/2018   Procedure: COLONOSCOPY;  Surgeon: Aviva Signs, MD;  Location: [facility];  Service: Gastroenterology;  Laterality: N/A;   COLONOSCOPY W/ POLYPECTOMY     HERNIA REPAIR     Bilateral inguinal hernia repair X 4   hydrocelectomy     left knee arthroscopy     LITHOTRIPSY     LYMPHADENECTOMY Bilateral 03/22/2015   Procedure: PELVIC LYMPHADENECTOMY;  Surgeon: Alexis Frock, MD;  Location: [facility];  Service: Urology;  Laterality: Bilateral;   NM MYOVIEW LTD  2011   POLYPECTOMY  09/22/2018   Procedure: POLYPECTOMY;  Surgeon: Aviva Signs, MD;  Location: [facility];  Service: Gastroenterology;;   ROBOT ASSISTED LAPAROSCOPIC RADICAL PROSTATECTOMY N/A 03/22/2015   Procedure: ROBOTIC ASSISTED LAPAROSCOPIC RADICAL PROSTATECTOMY WITH INDOCYANINE GREEN DYE INJECTION;  Surgeon: Alexis Frock, MD;  Location: [facility];  Service: Urology;  Laterality: N/A;   ROBOTIC ASSISTED LAPAROSCOPIC LYSIS OF ADHESION  03/22/2015   Procedure: ROBOTIC ASSISTED LAPAROSCOPIC LYSIS OF ADHESION;  Surgeon: Alexis Frock, MD;  Location: [facility];  Service: Urology;;   SHOULDER SURGERY Right    VASECTOMY     Social History:  reports that he has been smoking cigarettes. He has a 55.00 pack-year smoking history. He has never used  smokeless tobacco. He reports current alcohol use of about 1.0 standard drink of alcohol per week. He reports that he does not use drugs.  Allergies  Allergen Reactions   Lidocaine Hives   Benzocaine Other (See Comments)    Blisters    Other     Novocaine - blisters in mouth    Family History  Problem Relation Age of Onset   Heart failure Father    Stroke Father    Colon cancer Neg Hx     Prior to Admission medications   Medication Sig Start Date End Date Taking? Authorizing Provider  amLODipine (NORVASC) 10 MG tablet Take 10 mg by mouth daily.   Yes [provider]  aspirin EC 81 MG tablet Take 1 tablet (81 mg total) by mouth daily. Swallow whole. 05/16/22  Yes Ghimire, Dante Gang, MD  nicotine (NICODERM CQ - DOSED IN MG/24 HOURS) 14 mg/24hr patch Place 14 mg onto the skin daily. 07/05/22  Yes [provider]  pantoprazole (PROTONIX) 20 MG tablet Take 20 mg by mouth daily. 05/20/22  Yes [provider]  DULoxetine (CYMBALTA) 60 MG capsule Take 60 mg by mouth daily. Patient not taking: Reported on 08/08/2022 07/05/22   [provider]  HYDROcodone-acetaminophen (NORCO) 10-325 MG tablet Take 1 tablet by mouth every 6 (six) hours. Patient not taking: Reported on 08/08/2022 05/16/22   [provider]  ipratropium-albuterol (DUONEB) 0.5-2.5 (3) MG/3ML SOLN Take 3 mLs by nebulization every 4 (four) hours while awake for 2 days, THEN 3 mLs every 4 (four) hours as needed. Patient not taking: Reported on 08/08/2022 06/30/22 08/08/22  Mariel Aloe, MD    Physical Exam: Vitals:   08/09/22 0300 08/09/22 0317 08/09/22 0340 08/09/22 0344  BP: (!) 139/58   133/61  Pulse: (!) 102   96  Resp: 20   16  Temp:  97.8 F (36.6 C)  98.5 F (36.9 C)  TempSrc:  Oral  Oral  SpO2: 96%   94%  Weight:   104 kg   Height:       1.  General: Patient lying supine in bed,  no acute distress   2. Psychiatric: Alert and oriented x 3, mood and behavior normal for  situation, pleasant and cooperative with exam   3. Neurologic: Speech and language are normal, face is symmetric, moves all 4 extremities voluntarily, at baseline without acute deficits on limited exam   4. HEENMT:  Head is atraumatic, normocephalic, pupils reactive to light, neck is supple, trachea is midline, mucous membranes are moist   5. Respiratory : Diminished breath sounds in the lower lung fields, lungs are clear to auscultation bilaterally without wheezing, rhonchi, rales, no cyanosis, no increase in work of breathing or accessory muscle use, maintaining oxygen sats on 4 L nasal  cannula   6. Cardiovascular : Heart rate normal, rhythm is regular, no murmurs, rubs or gallops, no peripheral edema, peripheral pulses palpated   7. Gastrointestinal:  Abdomen is soft, nondistended, nontender to palpation bowel sounds active, no masses or organomegaly palpated   8. Skin:  Skin is warm, dry and intact without rashes, acute lesions, or ulcers on limited exam   9.Musculoskeletal:  No acute deformities or trauma, no asymmetry in tone, no peripheral edema, peripheral pulses palpated, no tenderness to palpation in the extremities  Data Reviewed: In the ED Temp 97.7, heart rate 79-109, respiratory rate 10-23, blood pressure 111/53-143/72, satting 90-100% UA is not indicative of UTI FOBT negative CTA chest shows no PE pH 7.45, PO2 50 Creatinine is elevated but at baseline Monitor  Assessment and Plan: * Symptomatic anemia - Hemoglobin down to 7.6 - Hemoglobin baseline around 11 - 1 unit transfused - Hemoglobin improvement to 8.9 - Dyspnea on exertion could be related to anemia?? - FOBT negative - Anemia panel ordered - Continue to monitor  GERD (gastroesophageal reflux disease) - Continue Protonix  Essential hypertension - Continue Norvasc  Acute respiratory failure with hypoxia (HCC) On oxygen at home only since last month - Reports exertional hypoxia down to 75% at  home - Admitted last month for COPD exacerbation and pneumonia - Denies any fever or cough at this time - Currently wearing 4 L nasal cannula - Reports that he is not able to take his nebulizer because he is run out of the prescription for it - No chest discomfort - Continue as needed DuoNeb - Concern for symptomatic anemia, transferred 1 unit - Continue to monitor      Advance Care Planning:   Code Status: Full Code  Consults: none  Family Communication: No family at bedside  Severity of Illness: The appropriate patient status for this patient is OBSERVATION. Observation status is judged to be reasonable and necessary in order to provide the required intensity of service to ensure the patient's safety. The patient's presenting symptoms, physical exam findings, and initial radiographic and laboratory data in the context of their medical condition is felt to place them at decreased risk for further clinical deterioration. Furthermore, it is anticipated that the patient will be medically stable for discharge from the hospital within 2 midnights of admission.   Author: Rolla Plate, DO 08/09/2022 5:38 AM  For on call review [email or web address].

## 2022-08-09 NOTE — Discharge Summary (Signed)
Physician Discharge Summary   Patient: Mark Cannon MRN: [id number] DOB: [birth year]  Admit date:     08/08/2022  Discharge date: 08/09/22  Discharge Physician: Barton Dubois   PCP: Jake Samples, PA-C   Recommendations at discharge:  Repeat CBC to follow hemoglobin trend/stability. Repeat basic metabolic panel to follow electrolytes and renal function Make sure patient has follow-up with pulmonologist as previously recommended for PFTs and further adjustment to his bronchodilator management. Patient instructed to follow-up with gastroenterology as an outpatient for further evaluation of anemia including endoscopy procedures.  Discharge Diagnoses: Principal Problem:   Symptomatic anemia Active Problems:   Acute respiratory failure with hypoxia (HCC)   Essential hypertension   GERD (gastroesophageal reflux disease)   Iron deficiency anemia  Brief Hospital Course: As per H&P written by Dr. Clearence Ped on 08/09/22 Mark Cannon is a 71 y.o. male with medical history significant of arthritis, cancer, GERD, hyperlipidemia, hypertension presents to the ED with a chief complaint of difficulty breathing.  Patient reports that has been gradually in onset over the last 2 weeks.  The dyspnea is exertional.  His oxygen sats dropped down to 75% while he is exerting himself.  Sitting down situating his oxygen and trying to rest makes it better.  He reports the lowest oxygen setting he is seen is 65% that was just prior to his previous hospitalization.  Patient reports no fevers, no cough.  He is wearing 4 L nasal cannula at baseline at home.  He reports he is not using his nebulizer because he ran out of medication for it.  He does not have any chest pains.  He does have some dizziness.  He has had no loss of consciousness.  No change in vision.  He reports he does have a ringing in his ear sometimes.  Patient reports that he did quit smoking last month.  He has orthopnea at home that he reports  is resolved but laying on his left or right side.  He reports no peripheral edema.  Patient reports a family history with his mother dying of "low hemoglobin."  According to patient, mother had been anemic for the last couple years of her life and had to have recurrent transfusions and there was never any etiology identified.   Patient does not smoke, does not drink, does not use illicit drugs.  He is vaccinated for COVID.  Patient is full code.   Assessment and Plan: * Symptomatic anemia -Hemoglobin down to 7.6 - Latest hemoglobin about 1 month ago around 11 - Excellent response to 1 unit PRBC transfusion during hospitalization -At discharge hemoglobin 8.9  -No signs of overt bleeding appreciated, patient's symptoms completely improved/resolved. -Case discussed with gastroenterology who recommended outpatient work-up for anemia and endoscopic evaluation if needed.   -Continue PPI and avoid any NSAIDs outside the use of baby enteric-coated aspirin.   -IV iron x1 also provided while inpatient as anemia panel demonstrated component of iron deficiency.  Obesity, Class I, BMI 30-34.9 -Body mass index is 34.86 kg/m. -Low-calorie diet, portion control and increase physical activity discussed with patient.  Iron deficiency anemia - Anemia panel demonstrating component of iron deficiency anemia -Endoscopic evaluation GI work-up for anemia to be pursued as an outpatient -Continue PPI -Fecal occult blood test negative -Patient hemodynamically stable.  GERD (gastroesophageal reflux disease) -Continue Protonix -Lifestyle changes discussed with patient.  Essential hypertension -Resume home antihypertensive regimen. -Heart healthy diet discussed with patient.  Acute respiratory failure with hypoxia (HCC) -On oxygen at  home since last month -Stable saturation appreciated -Continue 4 L nasal cannula supplementation -Continue as needed bronchodilators -Very likely component of hypoxia unsure  when the sensation driven by anemia. -Patient feeling ready to go home currently. -Outpatient follow-up with pulmonologist recommended. -No acute abnormalities appreciated on chest images.   Consultants: GI service curbside. Procedures performed: See below for x-ray reports. Disposition: Home Diet recommendation: Heart healthy diet.  DISCHARGE MEDICATION: Allergies as of 08/09/2022       Reactions   Lidocaine Hives   Benzocaine Other (See Comments)   Blisters    Other    Novocaine - blisters in mouth        Medication List     STOP taking these medications    DULoxetine 60 MG capsule Commonly known as: CYMBALTA       TAKE these medications    amLODipine 10 MG tablet Commonly known as: NORVASC Take 10 mg by mouth daily.   aspirin EC 81 MG tablet Take 1 tablet (81 mg total) by mouth daily. Swallow whole.   HYDROcodone-acetaminophen 10-325 MG tablet Commonly known as: NORCO Take 1 tablet by mouth every 6 (six) hours.   ipratropium-albuterol 0.5-2.5 (3) MG/3ML Soln Commonly known as: DUONEB Take 3 mLs by nebulization every 6 (six) hours as needed (SOB/wheezing.). What changed: See the new instructions.   nicotine 14 mg/24hr patch Commonly known as: NICODERM CQ - dosed in mg/24 hours Place 14 mg onto the skin daily.   pantoprazole 40 MG tablet Commonly known as: PROTONIX Take 1 tablet (40 mg total) by mouth daily. What changed:  medication strength how much to take        Follow-up Information     Jake Samples, PA-C. Schedule an appointment as soon as possible for a visit in 10 day(s).   Specialty: Family Medicine Contact information: [address] [place] [address] [phone]         Montez Morita, Quillian Quince, MD. Schedule an appointment as soon as possible for a visit in 3 week(s).   Specialty: Gastroenterology Contact information: [address] [place] [address] [phone]                 Discharge Exam: Danley Danker Weights   08/08/22 1525 08/09/22 0340  Weight: 104.1 kg 104 kg   General exam: Alert, awake, oriented x 3; good saturation appreciated on chronic supplementation.  No chest pain, no nausea, no vomiting.  No overt bleeding appreciated. Respiratory system: No wheezing or crackles on exam; positive scattered rhonchi.  No using accessory muscles. Cardiovascular system:RRR. No murmurs, rubs, gallops. Gastrointestinal system: Abdomen is obese, nondistended, soft and nontender. No organomegaly or masses felt. Normal bowel sounds heard. Central nervous system: Alert and oriented. No focal neurological deficits. Extremities: No cyanosis or clubbing. Skin: No rashes, lesions or ulcers Psychiatry: Judgement and insight appear normal. Mood & affect appropriate.    Condition at discharge: Stable and improved.  The results of significant diagnostics from this hospitalization (including imaging, microbiology, ancillary and laboratory) are listed below for reference.   Imaging Studies: CT Angio Chest PE W and/or Wo Contrast  Result Date: 08/08/2022 CLINICAL DATA:  Concern for pulmonary embolism. EXAM: CT ANGIOGRAPHY CHEST WITH CONTRAST TECHNIQUE: Multidetector CT imaging of the chest was performed using the standard protocol during bolus administration of intravenous contrast. Multiplanar CT image reconstructions and MIPs were obtained to evaluate the vascular anatomy. RADIATION DOSE REDUCTION: This exam was performed according to the departmental dose-optimization program which includes  automated exposure control, adjustment of the mA and/or kV according to patient size and/or use of iterative reconstruction technique. CONTRAST:  118m OMNIPAQUE IOHEXOL 350 MG/ML SOLN COMPARISON:  Chest radiograph dated 06/27/2022. FINDINGS: Evaluation of this exam is limited due to respiratory motion artifact. Cardiovascular: There is no cardiomegaly or pericardial effusion. There is 3 vessel  coronary vascular calcification. Moderate calcified and noncalcified plaque of the thoracic aorta. No aneurysmal dilatation or dissection. The origins of the great vessels of the aortic arch appear patent as visualized. No pulmonary artery embolus identified. Mediastinum/Nodes: No hilar or mediastinal adenopathy. The esophagus is grossly unremarkable. No mediastinal fluid collection. There is mediastinal lipomatosis. Lungs/Pleura: There is partial consolidation of the lingula with air bronchogram which may represent atelectasis. Developing infiltrate or underlying mass is not entirely excluded. Follow-up to resolution recommended. Small bilateral pleural effusions present. There is no pneumothorax. The central airways are patent. Upper Abdomen: Fatty liver.  Cholecystectomy. Musculoskeletal: Degenerative changes of the spine. Partially visualized cervical ACDF. No acute osseous pathology. Review of the MIP images confirms the above findings. IMPRESSION: 1. No CT evidence of pulmonary artery embolus. 2. Partial lingular atelectasis or infiltrate. 3. Small bilateral pleural effusions. 4. Fatty liver. 5.  Aortic Atherosclerosis (ICD10-I70.0). Electronically Signed   By: AAnner CreteM.D.   On: 08/08/2022 17:46    Microbiology: Results for orders placed or performed during the hospital encounter of 06/27/22  Resp Panel by RT-PCR (Flu A&B, Covid) Anterior Nasal Swab     Status: None   Collection Time: 06/27/22  1:57 PM   Specimen: Anterior Nasal Swab  Result Value Ref Range Status   SARS Coronavirus 2 by RT PCR NEGATIVE NEGATIVE Final    Comment: (NOTE) SARS-CoV-2 target nucleic acids are NOT DETECTED.  The SARS-CoV-2 RNA is generally detectable in upper respiratory specimens during the acute phase of infection. The lowest concentration of SARS-CoV-2 viral copies this assay can detect is 138 copies/mL. A negative result does not preclude SARS-Cov-2 infection and should not be used as the sole basis  for treatment or other patient management decisions. A negative result may occur with  improper specimen collection/handling, submission of specimen other than nasopharyngeal swab, presence of viral mutation(s) within the areas targeted by this assay, and inadequate number of viral copies(<138 copies/mL). A negative result must be combined with clinical observations, patient history, and epidemiological information. The expected result is Negative.  Fact Sheet for Patients:  hEntrepreneurPulse.com.au Fact Sheet for Healthcare Providers:  hIncredibleEmployment.be This test is no t yet approved or cleared by the [place] and  has been authorized for detection and/or diagnosis of SARS-CoV-2 by FDA under an Emergency Use Authorization (EUA). This EUA will remain  in effect (meaning this test can be used) for the duration of the COVID-19 declaration under Section 564(b)(1) of the Act, 21 U.S.C.section 360bbb-3(b)(1), unless the authorization is terminated  or revoked sooner.       Influenza A by PCR NEGATIVE NEGATIVE Final   Influenza B by PCR NEGATIVE NEGATIVE Final    Comment: (NOTE) The Xpert Xpress SARS-CoV-2/FLU/RSV plus assay is intended as an aid in the diagnosis of influenza from Nasopharyngeal swab specimens and should not be used as a sole basis for treatment. Nasal washings and aspirates are unacceptable for Xpert Xpress SARS-CoV-2/FLU/RSV testing.  Fact Sheet for Patients: hEntrepreneurPulse.com.au Fact Sheet for Healthcare Providers: hIncredibleEmployment.be This test is not yet approved or cleared by the [place] has been authorized for  detection and/or diagnosis of SARS-CoV-2 by FDA under an Emergency Use Authorization (EUA). This EUA will remain in effect (meaning this test can be used) for the duration of the COVID-19 declaration under Section 564(b)(1) of the Act, 21  U.S.C. section 360bbb-3(b)(1), unless the authorization is terminated or revoked.  Performed at [facility], [address], [place], [place] [address]     Labs: CBC: Recent Labs  Lab 08/08/22 1611 08/09/22 0326  WBC 9.2 9.5  NEUTROABS  --  6.6  HGB 7.6* 8.9*  HCT 26.4* 29.8*  MCV 84.6 83.2  PLT 326 726   Basic Metabolic Panel: Recent Labs  Lab 08/08/22 1611 08/09/22 0326  NA 138 140  K 4.0 3.9  CL 97* 96*  CO2 35* 36*  GLUCOSE 150* 165*  BUN 15 15  CREATININE 1.31* 1.15  CALCIUM 8.7* 9.2  MG  --  2.0   Liver Function Tests: Recent Labs  Lab 08/09/22 0326  AST 18  ALT 25  ALKPHOS 64  BILITOT 0.4  PROT 6.8  ALBUMIN 3.4*   CBG: Recent Labs  Lab 08/08/22 1657  GLUCAP 125*    Discharge time spent: greater than 30 minutes.  Signed: Barton Dubois, MD [facility] 08/09/2022

## 2022-08-09 NOTE — Assessment & Plan Note (Addendum)
-  On oxygen at home since last month -Stable saturation appreciated -Continue 4 L nasal cannula supplementation -Continue as needed bronchodilators -Very likely component of hypoxia unsure when the sensation driven by anemia. -Patient feeling ready to go home currently. -Outpatient follow-up with pulmonologist recommended. -No acute abnormalities appreciated on chest images.

## 2022-08-09 NOTE — Assessment & Plan Note (Signed)
-   Anemia panel demonstrating component of iron deficiency anemia -Endoscopic evaluation GI work-up for anemia to be pursued as an outpatient -Continue PPI -Fecal occult blood test negative -Patient hemodynamically stable.

## 2022-08-09 NOTE — Progress Notes (Signed)
  Transition of Care [facility]) Screening Note   Patient Details  Name: Mark Cannon Date of Birth: 1951/02/24   Transition of Care [facility]) CM/SW Contact:    Iona Beard, [place] Phone Number: 08/09/2022, 11:04 AM    Transition of Care Department [facility]) has reviewed patient and no TOC needs have been identified at this time. We will continue to monitor patient advancement through interdisciplinary progression rounds. If new patient transition needs arise, please place a TOC consult.

## 2022-08-09 NOTE — Assessment & Plan Note (Addendum)
-  Resume home antihypertensive regimen. -Heart healthy diet discussed with patient.

## 2022-08-09 NOTE — Progress Notes (Signed)
Discharge instructions reviewed, given AVS, patient verbalized understanding of instructions, follow-up, home meds and picking up prescriptions, states he will call PCP office this afternoon to schedule follow-up next week. Home O2 tank at bedside for transport home. IV site removed, site clean dry and intact with gauze dressing applied. Patient left unit via w/c accompanied by Aleen Sells, RN. Discharged home.

## 2022-08-09 NOTE — Assessment & Plan Note (Addendum)
-  Continue Protonix -Lifestyle changes discussed with patient.

## 2022-08-09 NOTE — Assessment & Plan Note (Signed)
-  Body mass index is 34.86 kg/m. -Low-calorie diet, portion control and increase physical activity discussed with patient.

## 2022-08-09 NOTE — Progress Notes (Signed)
Pt completed iron infusion without any difficulty.Pt aware of pending discharge.

## 2022-08-09 NOTE — Plan of Care (Signed)

## 2022-08-09 NOTE — ED Notes (Signed)
Lab at bedside

## 2022-08-09 NOTE — Telephone Encounter (Signed)
Patient needs to be seen for hospital follow-up to discuss endoscopic evaluation for anemia work-up.  He is a patient of Dr. Colman Cater.  Make appointment with any APP.   Venetia Night, MSN, APRN, FNP-BC, AGACNP-BC [facility]astroenter[facility] at [facility]

## 2022-08-12 ENCOUNTER — Telehealth (INDEPENDENT_AMBULATORY_CARE_PROVIDER_SITE_OTHER): Payer: Self-pay

## 2022-08-12 ENCOUNTER — Encounter (INDEPENDENT_AMBULATORY_CARE_PROVIDER_SITE_OTHER): Payer: Self-pay

## 2022-08-12 ENCOUNTER — Other Ambulatory Visit (INDEPENDENT_AMBULATORY_CARE_PROVIDER_SITE_OTHER): Payer: Self-pay

## 2022-08-12 ENCOUNTER — Ambulatory Visit (INDEPENDENT_AMBULATORY_CARE_PROVIDER_SITE_OTHER): Payer: Medicare Other | Admitting: Gastroenterology

## 2022-08-12 ENCOUNTER — Encounter (INDEPENDENT_AMBULATORY_CARE_PROVIDER_SITE_OTHER): Payer: Self-pay | Admitting: Gastroenterology

## 2022-08-12 VITALS — BP 127/69 | HR 80 | Temp 98.1°F | Ht 68.0 in | Wt 231.6 lb

## 2022-08-12 DIAGNOSIS — D509 Iron deficiency anemia, unspecified: Secondary | ICD-10-CM

## 2022-08-12 DIAGNOSIS — K625 Hemorrhage of anus and rectum: Secondary | ICD-10-CM

## 2022-08-12 MED ORDER — PEG 3350-KCL-NA BICARB-NACL 420 G PO SOLR: 4000.0000 mL | ORAL | 0 refills | Status: DC

## 2022-08-12 MED ORDER — SUTAB 1479-225-188 MG PO TABS: 1.0000 | ORAL_TABLET | Freq: Once | ORAL | 0 refills | Status: AC

## 2022-08-12 NOTE — Telephone Encounter (Signed)
Cashe Gatt Ann Starsky Nanna, CMA  ?

## 2022-08-12 NOTE — Telephone Encounter (Signed)
Warda Mcqueary Ann Zachrey Deutscher, CMA  ?

## 2022-08-12 NOTE — Telephone Encounter (Signed)
Valoria Tamburri Ann Mishell Donalson, CMA  ?

## 2022-08-12 NOTE — Patient Instructions (Signed)
It was nice to meet you! We will get you scheduled for colonoscopy and upper endoscopy for further evaluation of your iron deficiency anemia. Let's recheck blood counts on Friday to ensure they are stable If you have worsening dizziness, lightheadedness, pass out, please let us know as this could mean blood counts have dropped again.  Follow up 3 months

## 2022-08-12 NOTE — Progress Notes (Signed)
Referring Provider: Jake Samples, PA* Primary Care Physician:  Scherrie Bateman Primary GI Physician: Jenetta Downer  Chief Complaint  Patient presents with   Anemia    Hospital follow up on anemia. Has concerns low hemoglobin is due to hormone therapy he is receiving through [facility]. Takes injection every 6 months. Has had 3 -4 injections.    HPI:   Mark Cannon is a 71 y.o. male with past medical history of GERD, prostate cancer status post radiation therapy, HLD, HTN, COPD  and OSA  Patient presenting today for hospital follow up of anemia.  Last seen 07/2021, at that time having intermittent rectal bleeding since 2018 when he had radiation therapy for prostate cancer. Reported fresh blood on top of stool that would occur 3-4 days then clear up, also having some associated rectal pain. Using preparation H but does not feel it had helped much.  Patient recommended to proceed with colonoscopy for further evaluation. Colonoscopy was not completed.   Recent hospital admission on 08/08/22 for SOB and near syncope. Hgb on admission was 7.6 with negative FOBT. Iron 44, TIBC 449, saturation 10%, ferritin 11, transfused 1 unit PRBCs, also received IV iron x1.   Last hgb on 10/27 8.9  Patient reports that he had been feeling lightheaded and dizzy. Notes that he did have one episode of syncope prior to recent ED visit as above. Feeling better now after getting IV iron and PRBC transfusion in the hospital. Denies abdominal pain, weight loss or changes in appetite. Denies melena. No nausea or vomiting. He has 2-3 BMs per day, still seeing occasional toilet tissue hematochezia though feels this has slowed down since his last OV. States that he did not complete colonoscopy as previously recommended as he is concerned about drinking the prep and getting to the restroom on time. He has oxycodone on hand at home but takes this very sparingly. Denies issues with constipation or  diarrhea. Maintained on supplemental Oxygen at 2L during the day and 4L at night chronically due to COPD.   Last Colonoscopy: 2019 performed by Dr. Arnoldo Morale - One 4 mm polyp in the distal ascending colon, removed with a hot snare. Path TA. - One 2 mm polyp in the proximal transverse colon, removed with a hot snare. Path TA. - One 5 mm polyp in the mid sigmoid colon, removed with a hot snare. Path TA. - Diverticulosis in the sigmoid colon. - The examination was otherwise normal on direct and retroflexion views. Last Endoscopy: never  Recommendations:    Past Medical History:  Diagnosis Date   Arthritis    Cancer [facility])    Prostate, has had prostate removed, has had radiation and still has cancer,   Colon polyps    Cough    GERD (gastroesophageal reflux disease)    occasional    H/O tooth extraction week of 03/15/2015    History of kidney stones    Hypercholesteremia    Hypertension    Lumbar stenosis with neurogenic claudication    Pneumonia    Pre-diabetes    Renal disorder    Sleep apnea    No cpap    Past Surgical History:  Procedure Laterality Date   APPENDECTOMY     BACK SURGERY     CHOLECYSTECTOMY     COLONOSCOPY  02/04/2012   Procedure: COLONOSCOPY;  Surgeon: Jamesetta So, MD;  [facility];  Service: Gastroenterology;  Laterality: N/A;   COLONOSCOPY N/A 09/22/2018  Procedure: COLONOSCOPY;  Surgeon: Aviva Signs, MD;  Location[facility];  Service: Gastroenterology;  Laterality: N/A;   COLONOSCOPY W/ POLYPECTOMY     HERNIA REPAIR     Bilateral inguinal hernia repair X 4   hydrocelectomy     left knee arthroscopy     LITHOTRIPSY     LYMPHADENECTOMY Bilateral 03/22/2015   Procedure: PELVIC LYMPHADENECTOMY;  Surgeon: Alexis Frock, MD;  Location: [facility];  Service: Urology;  Laterality: Bilateral;   NM MYOVIEW LTD  2011   POLYPECTOMY  09/22/2018   Procedure: POLYPECTOMY;  Surgeon: Aviva Signs, MD;  Location[facility];  Service:  Gastroenterology;;   [place] N/A 03/22/2015   Procedure: ROBOTIC ASSISTED LAPAROSCOPIC RADICAL PROSTATECTOMY WITH INDOCYANINE GREEN DYE INJECTION;  Surgeon: Alexis Frock, MD;  Location: [facility];  Service: Urology;  Laterality: N/A;   ROBOTIC ASSISTED LAPAROSCOPIC LYSIS OF ADHESION  03/22/2015   Procedure: ROBOTIC ASSISTED LAPAROSCOPIC LYSIS OF ADHESION;  Surgeon: Alexis Frock, MD;  Location: [facility];  Service: Urology;;   SHOULDER SURGERY Right    VASECTOMY      Current Outpatient Medications  Medication Sig Dispense Refill   amLODipine (NORVASC) 10 MG tablet Take 10 mg by mouth daily.     aspirin EC 81 MG tablet Take 1 tablet (81 mg total) by mouth daily. Swallow whole. 30 tablet 12   HYDROcodone-acetaminophen (NORCO) 10-325 MG tablet Take 1 tablet by mouth every 6 (six) hours.     ipratropium-albuterol (DUONEB) 0.5-2.5 (3) MG/3ML SOLN Take 3 mLs by nebulization every 6 (six) hours as needed (SOB/wheezing.). 360 mL 2   nicotine (NICODERM CQ - DOSED IN MG/24 HOURS) 14 mg/24hr patch Place 14 mg onto the skin daily.     OXYGEN Inhale into the lungs. 2 liters during the day and 4 liters at night     pantoprazole (PROTONIX) 40 MG tablet Take 1 tablet (40 mg total) by mouth daily. 30 tablet 2   PRESCRIPTION MEDICATION Hormone therapy injection every 6 months through [facility] due to prostate cancer.     No current facility-administered medications for this visit.    Allergies as of 08/12/2022 - Review Complete 08/12/2022  Allergen Reaction Noted   Lidocaine Hives 01/03/2015   Benzocaine Other (See Comments) 03/20/2015   Other  07/16/2021    Family History  Problem Relation Age of Onset   Heart failure Father    Stroke Father    Colon cancer Neg Hx     Social History   Socioeconomic History   Marital status: Married    Spouse name: Not on file   Number of children: Not on file   Years of education: Not on file   Highest  education level: Not on file  Occupational History   Not on file  Tobacco Use   Smoking status: Former    Packs/day: 1.00    Years: 55.00    Total pack years: 55.00    Types: Cigarettes    Passive exposure: Past   Smokeless tobacco: Never  Vaping Use   Vaping Use: Never used  Substance and Sexual Activity   Alcohol use: Yes    Alcohol/week: 1.0 standard drink of alcohol    Types: 1 Glasses of wine per week    Comment: one glass of wine once a month   Drug use: No   Sexual activity: Not on file  Other Topics Concern   Not on file  Social History Narrative   Not  on file   Social Determinants of Health   Financial Resource Strain: Not on file  Food Insecurity: No Food Insecurity (08/09/2022)   Hunger Vital Sign    Worried About Running Out of Food in the Last Year: Never true    Ran Out of Food in the Last Year: Never true  Transportation Needs: No Transportation Needs (08/09/2022)   PRAPARE - Hydrologist (Medical): No    Lack of Transportation (Non-Medical): No  Physical Activity: Not on file  Stress: Not on file  Social Connections: Not on file   Review of systems General: negative for malaise, night sweats, fever, chills, weight loss Neck: Negative for lumps, goiter, pain and significant neck swelling Resp: Negative for cough, wheezing, dyspnea at rest CV: Negative for chest pain, leg swelling, palpitations, orthopnea GI: denies melena, nausea, vomiting, diarrhea, constipation, dysphagia, odyonophagia, early satiety or unintentional weight loss. +occasional toilet tissue hematochezia MSK: Negative for joint pain or swelling, back pain, and muscle pain. Derm: Negative for itching or rash Psych: Denies depression, anxiety, memory loss, confusion. No homicidal or suicidal ideation.  Heme: Negative for prolonged bleeding, bruising easily, and swollen nodes. Endocrine: Negative for cold or heat intolerance, polyuria, polydipsia and  goiter. Neuro: negative for tremor, gait imbalance, syncope and seizures. The remainder of the review of systems is noncontributory.  Physical Exam: BP 127/69 (BP Location: Right Arm, Patient Position: Sitting, Cuff Size: Large)   Pulse 80   Temp 98.1 F (36.7 C) (Oral)   Ht '5\' 8"'$  (1.727 m)   Wt 231 lb 9.6 oz (105.1 kg)   BMI 35.21 kg/m  General:   Alert and oriented. No distress noted. Pleasant and cooperative.  Head:  Normocephalic and atraumatic. Eyes:  Conjuctiva clear without scleral icterus. Mouth:  Oral mucosa pink and moist. Good dentition. No lesions. Heart: Normal rate and rhythm, s1 and s2 heart sounds present.  Lungs: Clear lung sounds in all lobes. Respirations equal and unlabored. Abdomen:  +BS, soft, non-tender and non-distended. No rebound or guarding. No HSM or masses noted. Derm: No palmar erythema or jaundice Msk:  Symmetrical without gross deformities. Normal posture. Extremities:  Without edema. Neurologic:  Alert and  oriented x4 Psych:  Alert and cooperative. Normal mood and affect.  Invalid input(s): "6 MONTHS"   ASSESSMENT: Mark Cannon is a 71 y.o. male presenting today for hospital follow up of [place].  Recent hospital admission with symptomatic iron deficiency anemia, notably FOBT x1 negative at that time. Hgb 7.6 on admission, improved to 8.9 after 1 transfusion of PRBCs. He did not have colonoscopy last October as previously recommended for hematochezia. He continues to have some intermittent hematochezia, denies melena. No abdominal pain, changes in bowel habits, nausea, vomiting, weight loss. He is not feeling symptomatic since IV iron and PRBC transfusion in hospital. Recommend proceeding with EGD and Colonoscopy given IDA as we cannot rule out PUD, gastritis, AVMs, bleeding polyps or malignancy. Indications, risks and benefits of procedure discussed in detail with patient. Patient verbalized understanding and is in agreement to proceed with  EGD/Colonoscopy at this time.    PLAN:  Colonoscopy and EGD ASA III, ENDO 3 2. Repeat CBC on Friday 11/3. 3. Iron rich diet 4. Pt to make me aware of worsening SOB, dizziness, lightheadedness, syncope  All questions were answered, patient verbalized understanding and is in agreement with plan as outlined above.   Follow Up: 3 months   Kadeidra Coryell L. Alver Sorrow, MSN, APRN, AGNP-C Adult-Gerontology  Nurse Practitioner [facility]  I have reviewed the note and agree with the APP's assessment as described in this progress note  Maylon Peppers, MD Gastroenterology and Hepatology [facility] Gastroenterology

## 2022-08-13 ENCOUNTER — Other Ambulatory Visit: Payer: Self-pay | Admitting: *Deleted

## 2022-08-13 ENCOUNTER — Encounter (INDEPENDENT_AMBULATORY_CARE_PROVIDER_SITE_OTHER): Payer: Self-pay

## 2022-08-13 DIAGNOSIS — Z87891 Personal history of nicotine dependence: Secondary | ICD-10-CM

## 2022-08-13 DIAGNOSIS — F17210 Nicotine dependence, cigarettes, uncomplicated: Secondary | ICD-10-CM

## 2022-08-13 DIAGNOSIS — Z122 Encounter for screening for malignant neoplasm of respiratory organs: Secondary | ICD-10-CM

## 2022-08-14 NOTE — Addendum Note (Signed)
Addended by: Harvel Quale on: 08/14/2022 04:03 PM   Modules accepted: Level of Service

## 2022-08-15 ENCOUNTER — Ambulatory Visit (HOSPITAL_COMMUNITY): Payer: Medicare Other

## 2022-08-16 DIAGNOSIS — J449 Chronic obstructive pulmonary disease, unspecified: Secondary | ICD-10-CM | POA: Diagnosis not present

## 2022-08-16 DIAGNOSIS — R7309 Other abnormal glucose: Secondary | ICD-10-CM | POA: Diagnosis not present

## 2022-08-16 DIAGNOSIS — R7301 Impaired fasting glucose: Secondary | ICD-10-CM | POA: Diagnosis not present

## 2022-08-16 DIAGNOSIS — Z6836 Body mass index (BMI) 36.0-36.9, adult: Secondary | ICD-10-CM | POA: Diagnosis not present

## 2022-08-16 DIAGNOSIS — D649 Anemia, unspecified: Secondary | ICD-10-CM | POA: Diagnosis not present

## 2022-08-19 DIAGNOSIS — N393 Stress incontinence (female) (male): Secondary | ICD-10-CM | POA: Diagnosis not present

## 2022-08-19 DIAGNOSIS — C775 Secondary and unspecified malignant neoplasm of intrapelvic lymph nodes: Secondary | ICD-10-CM | POA: Diagnosis not present

## 2022-08-19 DIAGNOSIS — C61 Malignant neoplasm of prostate: Secondary | ICD-10-CM | POA: Diagnosis not present

## 2022-08-22 ENCOUNTER — Telehealth: Payer: MEDICARE

## 2022-08-22 DIAGNOSIS — D649 Anemia, unspecified: Secondary | ICD-10-CM | POA: Diagnosis not present

## 2022-08-23 NOTE — Telephone Encounter
New Rx Request      Last seen by MD: 2023    Reason for the request: refill medication     Any Symptoms:  []  Yes  [x]  No       If yes, what symptoms are you experiencing:    o Duration of symptoms (how long):    o Have you taken medication for symptoms (OTC or Rx):      Is a particular medication being requested? albuterol    Was an appointment offered? No     Patient or caller has been notified of the turnaround time of 1-2 business day(s).

## 2022-08-27 MED ORDER — PROAIR HFA 108 (90 BASE) MCG/ACT IN AERS
2 | RESPIRATORY_TRACT | 1 refills | Status: AC | PRN
Start: 2022-08-27 — End: ?

## 2022-09-13 NOTE — Patient Instructions (Signed)
BAYDEN GIL  09/13/2022     '@PREFPERIOPPHARMACY'$ @   Your procedure is scheduled on  09/19/2022.   Report to [facility] at  0700 A.M.   Call this number if you have problems the morning of surgery:  [phone]  If you experience any cold or flu symptoms such as cough, fever, chills, shortness of breath, etc. between now and your scheduled surgery, please notify us at the above number.   Remember:  Follow the diet and prep instructions given to you by the office.      Use your nebulizer before you come and bring your rescue inhaler with you.     Take these medicines the morning of surgery with A SIP OF WATER        amlodipine, hydrocodone(If needed), pantoprazole.     Do not wear jewelry, make-up or nail polish.  Do not wear lotions, powders, or perfumes, or deodorant.  Do not shave 48 hours prior to surgery.  Men may shave face and neck.  Do not bring valuables to the hospital.  [facility] is not responsible for any belongings or valuables.  Contacts, dentures or bridgework may not be worn into surgery.  Leave your suitcase in the car.  After surgery it may be brought to your room.  For patients admitted to the hospital, discharge time will be determined by your treatment team.  Patients discharged the day of surgery will not be allowed to drive home and must have someone with them for 24 hours.    Special instructions:   DO NOT smoke tobacco or vape for 24 hours before your procedure.  Please read over the following fact sheets that you were given. Anesthesia Post-op Instructions and Care and Recovery After Surgery      Upper Endoscopy, Adult, Care After After the procedure, it is common to have a sore throat. It is also common to have: Mild stomach pain or discomfort. Bloating. Nausea. Follow these instructions at home: The instructions below may help you care for yourself at home. Your health care provider may give you more instructions. If you  have questions, ask your health care provider. If you were given a sedative during the procedure, it can affect you for several hours. Do not drive or operate machinery until your health care provider says that it is safe. If you will be going home right after the procedure, plan to have a responsible adult: Take you home from the hospital or clinic. You will not be allowed to drive. Care for you for the time you are told. Follow instructions from your health care provider about what you may eat and drink. Return to your normal activities as told by your health care provider. Ask your health care provider what activities are safe for you. Take over-the-counter and prescription medicines only as told by your health care provider. Contact a health care provider if you: Have a sore throat that lasts longer than one day. Have trouble swallowing. Have a fever. Get help right away if you: Vomit blood or your vomit looks like coffee grounds. Have bloody, black, or tarry stools. Have a very bad sore throat or you cannot swallow. Have difficulty breathing or very bad pain in your chest or abdomen. These symptoms may be an emergency. Get help right away. Call 911. Do not wait to see if the symptoms will go away. Do not drive yourself to the hospital. Summary After the procedure, it  is common to have a sore throat, mild stomach discomfort, bloating, and nausea. If you were given a sedative during the procedure, it can affect you for several hours. Do not drive until your health care provider says that it is safe. Follow instructions from your health care provider about what you may eat and drink. Return to your normal activities as told by your health care provider. This information is not intended to replace advice given to you by your health care provider. Make sure you discuss any questions you have with your health care provider. Document Revised: 01/09/2022 Document Reviewed: 01/09/2022 Elsevier  Patient Education  [place]. Colonoscopy, Adult, Care After The following information offers guidance on how to care for yourself after your procedure. Your health care provider may also give you more specific instructions. If you have problems or questions, contact your health care provider. What can I expect after the procedure? After the procedure, it is common to have: A small amount of blood in your stool for 24 hours after the procedure. Some gas. Mild cramping or bloating of your abdomen. Follow these instructions at home: Eating and drinking  Drink enough fluid to keep your urine pale yellow. Follow instructions from your health care provider about eating or drinking restrictions. Resume your normal diet as told by your health care provider. Avoid heavy or fried foods that are hard to digest. Activity Rest as told by your health care provider. Avoid sitting for a long time without moving. Get up to take short walks every 1-2 hours. This is important to improve blood flow and breathing. Ask for help if you feel weak or unsteady. Return to your normal activities as told by your health care provider. Ask your health care provider what activities are safe for you. Managing cramping and bloating  Try walking around when you have cramps or feel bloated. If directed, apply heat to your abdomen as told by your health care provider. Use the heat source that your health care provider recommends, such as a moist heat pack or a heating pad. Place a towel between your skin and the heat source. Leave the heat on for 20-30 minutes. Remove the heat if your skin turns bright red. This is especially important if you are unable to feel pain, heat, or cold. You have a greater risk of getting burned. General instructions If you were given a sedative during the procedure, it can affect you for several hours. Do not drive or operate machinery until your health care provider says that it is  safe. For the first 24 hours after the procedure: Do not sign important documents. Do not drink alcohol. Do your regular daily activities at a slower pace than normal. Eat soft foods that are easy to digest. Take over-the-counter and prescription medicines only as told by your health care provider. Keep all follow-up visits. This is important. Contact a health care provider if: You have blood in your stool 2-3 days after the procedure. Get help right away if: You have more than a small spotting of blood in your stool. You have large blood clots in your stool. You have swelling of your abdomen. You have nausea or vomiting. You have a fever. You have increasing pain in your abdomen that is not relieved with medicine. These symptoms may be an emergency. Get help right away. Call 911. Do not wait to see if the symptoms will go away. Do not drive yourself to the hospital. Summary After the procedure, it  is common to have a small amount of blood in your stool. You may also have mild cramping and bloating of your abdomen. If you were given a sedative during the procedure, it can affect you for several hours. Do not drive or operate machinery until your health care provider says that it is safe. Get help right away if you have a lot of blood in your stool, nausea or vomiting, a fever, or increased pain in your abdomen. This information is not intended to replace advice given to you by your health care provider. Make sure you discuss any questions you have with your health care provider. Document Revised: 05/23/2021 Document Reviewed: 05/23/2021 Elsevier Patient Education  [place] After The following information offers guidance on how to care for yourself after your procedure. Your health care provider may also give you more specific instructions. If you have problems or questions, contact your health care provider. What can I expect after the  procedure? After the procedure, it is common to have: Tiredness. Little or no memory about what happened during or after the procedure. Impaired judgment when it comes to making decisions. Nausea or vomiting. Some trouble with balance. Follow these instructions at home: For the time period you were told by your health care provider:  Rest. Do not participate in activities where you could fall or become injured. Do not drive or use machinery. Do not drink alcohol. Do not take sleeping pills or medicines that cause drowsiness. Do not make important decisions or sign legal documents. Do not take care of children on your own. Medicines Take over-the-counter and prescription medicines only as told by your health care provider. If you were prescribed antibiotics, take them as told by your health care provider. Do not stop using the antibiotic even if you start to feel better. Eating and drinking Follow instructions from your health care provider about what you may eat and drink. Drink enough fluid to keep your urine pale yellow. If you vomit: Drink clear fluids slowly and in small amounts as you are able. Clear fluids include water, ice chips, low-calorie sports drinks, and fruit juice that has water added to it (diluted fruit juice). Eat light and bland foods in small amounts as you are able. These foods include bananas, applesauce, rice, lean meats, toast, and crackers. General instructions  Have a responsible adult stay with you for the time you are told. It is important to have someone help care for you until you are awake and alert. If you have sleep apnea, surgery and some medicines can increase your risk for breathing problems. Follow instructions from your health care provider about wearing your sleep device: When you are sleeping. This includes during daytime naps. While taking prescription pain medicines, sleeping medicines, or medicines that make you drowsy. Do not use any  products that contain nicotine or tobacco. These products include cigarettes, chewing tobacco, and vaping devices, such as e-cigarettes. If you need help quitting, ask your health care provider. Contact a health care provider if: You feel nauseous or vomit every time you eat or drink. You feel light-headed. You are still sleepy or having trouble with balance after 24 hours. You get a rash. You have a fever. You have redness or swelling around the IV site. Get help right away if: You have trouble breathing. You have new confusion after you get home. These symptoms may be an emergency. Get help right away. Call 911. Do not wait to see  if the symptoms will go away. Do not drive yourself to the hospital. This information is not intended to replace advice given to you by your health care provider. Make sure you discuss any questions you have with your health care provider. Document Revised: 02/25/2022 Document Reviewed: 02/25/2022 Elsevier Patient Education  [place].

## 2022-09-16 ENCOUNTER — Encounter (HOSPITAL_COMMUNITY): Payer: Self-pay

## 2022-09-16 ENCOUNTER — Encounter (HOSPITAL_COMMUNITY)
Admission: RE | Admit: 2022-09-16 | Discharge: 2022-09-16 | Disposition: A | Discharge status: Home / Self Care | Payer: Medicare Other | Source: Ambulatory Visit | Attending: Gastroenterology | Admitting: Gastroenterology

## 2022-09-16 VITALS — BP 154/60 | HR 87 | Temp 98.5°F | Resp 18 | Ht 68.0 in | Wt 231.7 lb

## 2022-09-16 DIAGNOSIS — R7303 Prediabetes: Secondary | ICD-10-CM | POA: Diagnosis not present

## 2022-09-16 DIAGNOSIS — D509 Iron deficiency anemia, unspecified: Secondary | ICD-10-CM | POA: Diagnosis not present

## 2022-09-16 DIAGNOSIS — Z01812 Encounter for preprocedural laboratory examination: Secondary | ICD-10-CM | POA: Insufficient documentation

## 2022-09-16 DIAGNOSIS — D649 Anemia, unspecified: Secondary | ICD-10-CM

## 2022-09-16 LAB — CBC WITH DIFFERENTIAL/PLATELET
Abs Immature Granulocytes: 0.02 10*3/uL (ref 0.00–0.07)
Basophils Absolute: 0.1 10*3/uL (ref 0.0–0.1)
Basophils Relative: 1 %
Eosinophils Absolute: 0.4 10*3/uL (ref 0.0–0.5)
Eosinophils Relative: 6 %
HCT: 28.7 % — ABNORMAL LOW (ref 39.0–52.0)
Hemoglobin: 8.1 g/dL — ABNORMAL LOW (ref 13.0–17.0)
Immature Granulocytes: 0 %
Lymphocytes Relative: 16 %
Lymphs Abs: 1.2 10*3/uL (ref 0.7–4.0)
MCH: 24.3 pg — ABNORMAL LOW (ref 26.0–34.0)
MCHC: 28.2 g/dL — ABNORMAL LOW (ref 30.0–36.0)
MCV: 86.2 fL (ref 80.0–100.0)
Monocytes Absolute: 0.4 10*3/uL (ref 0.1–1.0)
Monocytes Relative: 6 %
Neutro Abs: 5.4 10*3/uL (ref 1.7–7.7)
Neutrophils Relative %: 71 %
Platelets: 329 10*3/uL (ref 150–400)
RBC: 3.33 MIL/uL — ABNORMAL LOW (ref 4.22–5.81)
RDW: 15.3 % (ref 11.5–15.5)
WBC: 7.5 10*3/uL (ref 4.0–10.5)
nRBC: 0 % (ref 0.0–0.2)

## 2022-09-16 LAB — BASIC METABOLIC PANEL
Anion gap: 9 (ref 5–15)
BUN: 14 mg/dL (ref 8–23)
CO2: 34 mmol/L — ABNORMAL HIGH (ref 22–32)
Calcium: 8.8 mg/dL — ABNORMAL LOW (ref 8.9–10.3)
Chloride: 91 mmol/L — ABNORMAL LOW (ref 98–111)
Creatinine, Ser: 1.28 mg/dL — ABNORMAL HIGH (ref 0.61–1.24)
GFR, Estimated: 60 mL/min — ABNORMAL LOW (ref >60)
Glucose, Bld: 362 mg/dL — ABNORMAL HIGH (ref 70–99)
Potassium: 4.5 mmol/L (ref 3.5–5.1)
Sodium: 134 mmol/L — ABNORMAL LOW (ref 135–145)

## 2022-09-17 ENCOUNTER — Telehealth (INDEPENDENT_AMBULATORY_CARE_PROVIDER_SITE_OTHER): Payer: Self-pay | Admitting: *Deleted

## 2022-09-17 NOTE — Progress Notes (Signed)
Patient CBG 362.  EGD/TCS cancelled for now.  He will follow up with his PCP for diabetic management. Patient notified by voicemail.

## 2022-09-17 NOTE — Pre-Procedure Instructions (Addendum)
Messaged Dr Jenetta Downer about glucose level at pre-op. Dr Charna Elizabeth wants patient seen by PCP and glucose under better control before procedure. I messaged Dr Jenetta Downer and Mindy @ [facility] to let them know. I also contacted Blair Hailey, OR scheduler to have patient placed in depot. TGreene, RN, left message for patient about above cancellation and following up with PCP prior to procedure.

## 2022-09-17 NOTE — Telephone Encounter (Signed)
Per kim in endo "Mark Cannon is not currently on meds for diabetes but glucose was 362 at his preop. Dr. Wyatt Haste wants PCP to have him under better control before procedure. [name] is getting ready to call patient and let him know"

## 2022-09-19 ENCOUNTER — Ambulatory Visit (HOSPITAL_COMMUNITY): Admission: RE | Admit: 2022-09-19 | Payer: Medicare Other | Source: Home / Self Care | Admitting: Gastroenterology

## 2022-09-19 ENCOUNTER — Encounter (HOSPITAL_COMMUNITY): Admission: RE | Payer: Self-pay | Source: Home / Self Care

## 2022-09-19 SURGERY — COLONOSCOPY WITH PROPOFOL
Anesthesia: Monitor Anesthesia Care

## 2022-10-11 ENCOUNTER — Emergency Department (HOSPITAL_COMMUNITY): Payer: Medicare Other

## 2022-10-11 ENCOUNTER — Encounter (HOSPITAL_COMMUNITY): Payer: Self-pay

## 2022-10-11 ENCOUNTER — Inpatient Hospital Stay (HOSPITAL_COMMUNITY)
Admission: EM | Admit: 2022-10-11 | Discharge: 2022-10-15 | DRG: 291 | Disposition: A | Discharge status: Home / Self Care | Payer: Medicare Other | Attending: Internal Medicine | Admitting: Internal Medicine

## 2022-10-11 DIAGNOSIS — E7800 Pure hypercholesterolemia, unspecified: Secondary | ICD-10-CM | POA: Diagnosis present

## 2022-10-11 DIAGNOSIS — Z9981 Dependence on supplemental oxygen: Secondary | ICD-10-CM | POA: Diagnosis not present

## 2022-10-11 DIAGNOSIS — Z91199 Patient's noncompliance with other medical treatment and regimen due to unspecified reason: Secondary | ICD-10-CM

## 2022-10-11 DIAGNOSIS — Z9079 Acquired absence of other genital organ(s): Secondary | ICD-10-CM

## 2022-10-11 DIAGNOSIS — J181 Lobar pneumonia, unspecified organism: Secondary | ICD-10-CM | POA: Diagnosis not present

## 2022-10-11 DIAGNOSIS — K219 Gastro-esophageal reflux disease without esophagitis: Secondary | ICD-10-CM | POA: Diagnosis not present

## 2022-10-11 DIAGNOSIS — D509 Iron deficiency anemia, unspecified: Secondary | ICD-10-CM | POA: Diagnosis not present

## 2022-10-11 DIAGNOSIS — J9621 Acute and chronic respiratory failure with hypoxia: Secondary | ICD-10-CM | POA: Diagnosis present

## 2022-10-11 DIAGNOSIS — Z823 Family history of stroke: Secondary | ICD-10-CM

## 2022-10-11 DIAGNOSIS — D649 Anemia, unspecified: Principal | ICD-10-CM

## 2022-10-11 DIAGNOSIS — R918 Other nonspecific abnormal finding of lung field: Secondary | ICD-10-CM | POA: Diagnosis not present

## 2022-10-11 DIAGNOSIS — D62 Acute posthemorrhagic anemia: Secondary | ICD-10-CM | POA: Diagnosis present

## 2022-10-11 DIAGNOSIS — C61 Malignant neoplasm of prostate: Secondary | ICD-10-CM | POA: Diagnosis present

## 2022-10-11 DIAGNOSIS — I5031 Acute diastolic (congestive) heart failure: Secondary | ICD-10-CM | POA: Diagnosis not present

## 2022-10-11 DIAGNOSIS — M1990 Unspecified osteoarthritis, unspecified site: Secondary | ICD-10-CM | POA: Diagnosis present

## 2022-10-11 DIAGNOSIS — Z8249 Family history of ischemic heart disease and other diseases of the circulatory system: Secondary | ICD-10-CM

## 2022-10-11 DIAGNOSIS — R0902 Hypoxemia: Secondary | ICD-10-CM | POA: Diagnosis not present

## 2022-10-11 DIAGNOSIS — Z8673 Personal history of transient ischemic attack (TIA), and cerebral infarction without residual deficits: Secondary | ICD-10-CM

## 2022-10-11 DIAGNOSIS — J449 Chronic obstructive pulmonary disease, unspecified: Secondary | ICD-10-CM | POA: Diagnosis not present

## 2022-10-11 DIAGNOSIS — K635 Polyp of colon: Secondary | ICD-10-CM | POA: Diagnosis not present

## 2022-10-11 DIAGNOSIS — Z1152 Encounter for screening for COVID-19: Secondary | ICD-10-CM

## 2022-10-11 DIAGNOSIS — D631 Anemia in chronic kidney disease: Secondary | ICD-10-CM | POA: Diagnosis present

## 2022-10-11 DIAGNOSIS — K5730 Diverticulosis of large intestine without perforation or abscess without bleeding: Secondary | ICD-10-CM | POA: Diagnosis not present

## 2022-10-11 DIAGNOSIS — E1165 Type 2 diabetes mellitus with hyperglycemia: Secondary | ICD-10-CM | POA: Diagnosis present

## 2022-10-11 DIAGNOSIS — Z8546 Personal history of malignant neoplasm of prostate: Secondary | ICD-10-CM | POA: Diagnosis not present

## 2022-10-11 DIAGNOSIS — E669 Obesity, unspecified: Secondary | ICD-10-CM | POA: Diagnosis present

## 2022-10-11 DIAGNOSIS — Z87891 Personal history of nicotine dependence: Secondary | ICD-10-CM | POA: Diagnosis not present

## 2022-10-11 DIAGNOSIS — D120 Benign neoplasm of cecum: Secondary | ICD-10-CM | POA: Diagnosis not present

## 2022-10-11 DIAGNOSIS — E1122 Type 2 diabetes mellitus with diabetic chronic kidney disease: Secondary | ICD-10-CM | POA: Diagnosis present

## 2022-10-11 DIAGNOSIS — Z8719 Personal history of other diseases of the digestive system: Secondary | ICD-10-CM

## 2022-10-11 DIAGNOSIS — Z79899 Other long term (current) drug therapy: Secondary | ICD-10-CM | POA: Diagnosis not present

## 2022-10-11 DIAGNOSIS — Z7982 Long term (current) use of aspirin: Secondary | ICD-10-CM

## 2022-10-11 DIAGNOSIS — N1831 Chronic kidney disease, stage 3a: Secondary | ICD-10-CM | POA: Diagnosis present

## 2022-10-11 DIAGNOSIS — D123 Benign neoplasm of transverse colon: Secondary | ICD-10-CM | POA: Diagnosis not present

## 2022-10-11 DIAGNOSIS — Z6836 Body mass index (BMI) 36.0-36.9, adult: Secondary | ICD-10-CM | POA: Diagnosis not present

## 2022-10-11 DIAGNOSIS — G4733 Obstructive sleep apnea (adult) (pediatric): Secondary | ICD-10-CM | POA: Diagnosis present

## 2022-10-11 DIAGNOSIS — R002 Palpitations: Secondary | ICD-10-CM | POA: Diagnosis present

## 2022-10-11 DIAGNOSIS — D124 Benign neoplasm of descending colon: Secondary | ICD-10-CM | POA: Diagnosis not present

## 2022-10-11 DIAGNOSIS — I10 Essential (primary) hypertension: Secondary | ICD-10-CM | POA: Diagnosis not present

## 2022-10-11 DIAGNOSIS — I130 Hypertensive heart and chronic kidney disease with heart failure and stage 1 through stage 4 chronic kidney disease, or unspecified chronic kidney disease: Principal | ICD-10-CM | POA: Diagnosis present

## 2022-10-11 DIAGNOSIS — D125 Benign neoplasm of sigmoid colon: Secondary | ICD-10-CM | POA: Diagnosis not present

## 2022-10-11 DIAGNOSIS — Z884 Allergy status to anesthetic agent status: Secondary | ICD-10-CM

## 2022-10-11 DIAGNOSIS — J90 Pleural effusion, not elsewhere classified: Secondary | ICD-10-CM | POA: Diagnosis not present

## 2022-10-11 DIAGNOSIS — D500 Iron deficiency anemia secondary to blood loss (chronic): Secondary | ICD-10-CM | POA: Diagnosis not present

## 2022-10-11 DIAGNOSIS — I959 Hypotension, unspecified: Secondary | ICD-10-CM | POA: Diagnosis not present

## 2022-10-11 DIAGNOSIS — R0602 Shortness of breath: Secondary | ICD-10-CM | POA: Diagnosis not present

## 2022-10-11 DIAGNOSIS — J9611 Chronic respiratory failure with hypoxia: Secondary | ICD-10-CM | POA: Diagnosis not present

## 2022-10-11 DIAGNOSIS — Z87442 Personal history of urinary calculi: Secondary | ICD-10-CM

## 2022-10-11 LAB — CBC
HCT: 22.7 % — ABNORMAL LOW (ref 39.0–52.0)
Hemoglobin: 6.3 g/dL — CL (ref 13.0–17.0)
MCH: 22.8 pg — ABNORMAL LOW (ref 26.0–34.0)
MCHC: 27.8 g/dL — ABNORMAL LOW (ref 30.0–36.0)
MCV: 82.2 fL (ref 80.0–100.0)
Platelets: 299 10*3/uL (ref 150–400)
RBC: 2.76 MIL/uL — ABNORMAL LOW (ref 4.22–5.81)
RDW: 15.6 % — ABNORMAL HIGH (ref 11.5–15.5)
WBC: 9.7 10*3/uL (ref 4.0–10.5)
nRBC: 0.3 % — ABNORMAL HIGH (ref 0.0–0.2)

## 2022-10-11 LAB — BASIC METABOLIC PANEL
Anion gap: 8 (ref 5–15)
BUN: 16 mg/dL (ref 8–23)
CO2: 35 mmol/L — ABNORMAL HIGH (ref 22–32)
Calcium: 8.7 mg/dL — ABNORMAL LOW (ref 8.9–10.3)
Chloride: 94 mmol/L — ABNORMAL LOW (ref 98–111)
Creatinine, Ser: 1.18 mg/dL (ref 0.61–1.24)
GFR, Estimated: >60 mL/min (ref >60)
Glucose, Bld: 211 mg/dL — ABNORMAL HIGH (ref 70–99)
Potassium: 4.4 mmol/L (ref 3.5–5.1)
Sodium: 137 mmol/L (ref 135–145)

## 2022-10-11 NOTE — ED Triage Notes (Signed)
Pt reports exertional dyspnea and palpitations.  Pt is concerned he has Afib.

## 2022-10-12 ENCOUNTER — Observation Stay (HOSPITAL_COMMUNITY): Payer: Medicare Other

## 2022-10-12 DIAGNOSIS — J9611 Chronic respiratory failure with hypoxia: Secondary | ICD-10-CM | POA: Diagnosis not present

## 2022-10-12 DIAGNOSIS — E669 Obesity, unspecified: Secondary | ICD-10-CM

## 2022-10-12 DIAGNOSIS — D649 Anemia, unspecified: Secondary | ICD-10-CM | POA: Diagnosis not present

## 2022-10-12 DIAGNOSIS — C61 Malignant neoplasm of prostate: Secondary | ICD-10-CM | POA: Diagnosis not present

## 2022-10-12 DIAGNOSIS — D500 Iron deficiency anemia secondary to blood loss (chronic): Secondary | ICD-10-CM | POA: Diagnosis not present

## 2022-10-12 DIAGNOSIS — I10 Essential (primary) hypertension: Secondary | ICD-10-CM

## 2022-10-12 DIAGNOSIS — J90 Pleural effusion, not elsewhere classified: Secondary | ICD-10-CM | POA: Diagnosis not present

## 2022-10-12 LAB — CBC
HCT: 28.8 % — ABNORMAL LOW (ref 39.0–52.0)
Hemoglobin: 8.5 g/dL — ABNORMAL LOW (ref 13.0–17.0)
MCH: 24.7 pg — ABNORMAL LOW (ref 26.0–34.0)
MCHC: 29.5 g/dL — ABNORMAL LOW (ref 30.0–36.0)
MCV: 83.7 fL (ref 80.0–100.0)
Platelets: 280 10*3/uL (ref 150–400)
RBC: 3.44 MIL/uL — ABNORMAL LOW (ref 4.22–5.81)
RDW: 15.3 % (ref 11.5–15.5)
WBC: 10.4 10*3/uL (ref 4.0–10.5)
nRBC: 0.3 % — ABNORMAL HIGH (ref 0.0–0.2)

## 2022-10-12 LAB — PREPARE RBC (CROSSMATCH)

## 2022-10-12 LAB — CBG MONITORING, ED: Glucose-Capillary: 164 mg/dL — ABNORMAL HIGH (ref 70–99)

## 2022-10-12 LAB — GLUCOSE, CAPILLARY
Glucose-Capillary: 131 mg/dL — ABNORMAL HIGH (ref 70–99)
Glucose-Capillary: 166 mg/dL — ABNORMAL HIGH (ref 70–99)

## 2022-10-12 LAB — IRON AND TIBC
Iron: 107 ug/dL (ref 45–182)
Saturation Ratios: 23 % (ref 17.9–39.5)
TIBC: 469 ug/dL — ABNORMAL HIGH (ref 250–450)
UIBC: 362 ug/dL

## 2022-10-12 LAB — FERRITIN: Ferritin: 8 ng/mL — ABNORMAL LOW (ref 24–336)

## 2022-10-12 MED ORDER — ONDANSETRON HCL 4 MG/2ML IJ SOLN: 4.0000 mg | Freq: Four times a day (QID) | INTRAMUSCULAR | Status: DC | PRN

## 2022-10-12 MED ORDER — PANTOPRAZOLE SODIUM 40 MG IV SOLR
40.0000 mg | Freq: Two times a day (BID) | INTRAVENOUS | Status: DC
  Administered 2022-10-12 – 2022-10-15 (×7): 40 mg via INTRAVENOUS
  Filled 2022-10-12 (×7): qty 10

## 2022-10-12 MED ORDER — ACETAMINOPHEN 650 MG RE SUPP: 650.0000 mg | Freq: Four times a day (QID) | RECTAL | Status: DC | PRN

## 2022-10-12 MED ORDER — INSULIN ASPART 100 UNIT/ML IJ SOLN
0.0000 [IU] | Freq: Three times a day (TID) | INTRAMUSCULAR | Status: DC
  Administered 2022-10-12: 2 [IU] via SUBCUTANEOUS
  Administered 2022-10-12: 1 [IU] via SUBCUTANEOUS
  Administered 2022-10-13: 2 [IU] via SUBCUTANEOUS
  Administered 2022-10-13: 1 [IU] via SUBCUTANEOUS
  Administered 2022-10-13: 2 [IU] via SUBCUTANEOUS
  Administered 2022-10-14 (×2): 1 [IU] via SUBCUTANEOUS
  Administered 2022-10-14: 2 [IU] via SUBCUTANEOUS
  Administered 2022-10-15: 1 [IU] via SUBCUTANEOUS
  Filled 2022-10-12: qty 1

## 2022-10-12 MED ORDER — ACETAMINOPHEN 325 MG PO TABS: 650.0000 mg | ORAL_TABLET | Freq: Four times a day (QID) | ORAL | Status: DC | PRN

## 2022-10-12 MED ORDER — SODIUM CHLORIDE 0.9% IV SOLUTION: Freq: Once | INTRAVENOUS | Status: DC

## 2022-10-12 MED ORDER — ONDANSETRON HCL 4 MG PO TABS: 4.0000 mg | ORAL_TABLET | Freq: Four times a day (QID) | ORAL | Status: DC | PRN

## 2022-10-12 MED ORDER — IPRATROPIUM-ALBUTEROL 0.5-2.5 (3) MG/3ML IN SOLN
3.0000 mL | Freq: Four times a day (QID) | RESPIRATORY_TRACT | Status: DC | PRN
  Filled 2022-10-12: qty 3

## 2022-10-12 NOTE — Progress Notes (Signed)
Patient called and expressed sob.  Patient states he is chronically on 4 liters of oxygen.  When primary nurse entered room, oxygen saturations were in the 70s.  Patient placed on 10 liters of oxygen and oxygen saturations were still in the 80s.  Patient placed on nrb and oxygen saturation increased to the 90s.  Notified MD on call and received order for chest x-ray and and ABG.  MD came to floor and access patient.  Patient was decreased down to 10 liters of oxygen  via high flow.

## 2022-10-12 NOTE — ED Notes (Signed)
Breakfast tray given. °

## 2022-10-12 NOTE — Hospital Course (Signed)
71 year old male with a history of hypertension, hyperlipidemia, prostate cancer status post prostatectomy, OSA, CKD stage III, chronic respiratory failure on 4 L, tobacco abuse in remission presenting with dyspnea on exertion over the past 2 to 3 days prior to admission.  The patient states that he had a similar presentation back in October 2023 when he was admitted for symptomatic anemia.  He was transfused with 1 unit PRBC at that time and discharged home in stable condition.  GI workup was deferred to the outpatient setting.  He subsequently followed up with [facility] in the office on 08/12/2022.  Colonoscopy was planned given his history of iron deficiency anemia.  However this was delayed due to his poorly controlled sugars. He presents again with generalized weakness and dyspnea on exertion.  He denies any hematochezia, melena, hematemesis, hematuria.  He has not had any fevers, chills, chest pain, nausea, vomiting, diarrhea, abdominal pain.  He denies any NSAIDs.  He denies any worsening lower extremity edema. In the ED, the patient was afebrile and hemodynamically stable with oxygen saturation 94-96% on 4 L.  WBC 9.7, hemoglobin 6.3, platelets 299,000.  Sodium 137, potassium 4.4, bicarbonate 35, serum creatinine 1.18. FOBT was neg in ED.  GI was consulted to assist with management.  He was transfused with 2 units PRBC

## 2022-10-12 NOTE — ED Notes (Signed)
Pt wheeled to bathroom with o2. When helped back in room. Pt was labored breathing and sats 77 on 4L. Took approx 4 min for pt to get to 97% 4L and pt states he is feeling better. Sob has resided as well.

## 2022-10-12 NOTE — Plan of Care (Signed)
  Problem: Education: Goal: Ability to describe self-care measures that may prevent or decrease complications (Diabetes Survival Skills Education) will improve Outcome: Progressing   Problem: Education: Goal: Knowledge of General Education information will improve Description: Including pain rating scale, medication(s)/side effects and non-pharmacologic comfort measures Outcome: Progressing   

## 2022-10-12 NOTE — Consult Note (Signed)
Referring Provider: No ref. provider found Primary Care Physician:  Scherrie Bateman Primary Gastroenterologist:  Doneen Poisson  Reason for Consultation: Iron deficiency anemia  HPI: 71 year old gentleman with a past medical history significant for GERD prostate cancer status post prostatectomy and XRT, hypertension, O2 dependent COPD/OCA-noncompliant with CPAP him to the hospital yesterday with progressive fusion of dyspnea on exertion. It admission hemoglobin 6.5; he is received 2 units of packed red blood cells hemoglobin 8.5.  Stool for occult blood testing negative in the ED this admission and negative back in October.  Patient denies melena or rectal bleeding although he did have small-volume rectal bleeding elated to prostate irradiation last year.  He does have chronic GERD for which she takes Protonix on a sporadic basis.  No dysphagia, odynophagia, nausea or vomiting.  He denies abdominal pain. Last colonoscopy 2019-Dr. Jenkins-multiple small adenomas removed. Plans for colonoscopy earlier this year were derailed by metabolic abnormality-elevated blood sugars. Has marked exercise limitation. ED staff is noted desaturation on 4 L nasal prongs by just getting out of bed and moving towards the door (after 2 unit transfusion).  Standing GERD-no prior EGD.   Past Medical History:  Diagnosis Date   Arthritis    Cancer [facility])    Prostate, has had prostate removed, has had radiation and still has cancer,   Colon polyps    Cough    GERD (gastroesophageal reflux disease)    occasional    H/O tooth extraction week of 03/15/2015    History of kidney stones    Hypercholesteremia    Hypertension    Lumbar stenosis with neurogenic claudication    Pneumonia    Pre-diabetes    Renal disorder    Sleep apnea    No cpap    Past Surgical History:  Procedure Laterality Date   APPENDECTOMY     BACK SURGERY     CHOLECYSTECTOMY     COLONOSCOPY  02/04/2012   Procedure:  COLONOSCOPY;  Surgeon: Jamesetta So, MD;  [facility];  Service: Gastroenterology;  Laterality: N/A;   COLONOSCOPY N/A 09/22/2018   Procedure: COLONOSCOPY;  Surgeon: Aviva Signs, MD;  Location: [facility];  Service: Gastroenterology;  Laterality: N/A;   COLONOSCOPY W/ POLYPECTOMY     HERNIA REPAIR     Bilateral inguinal hernia repair X 4   hydrocelectomy     left knee arthroscopy     LITHOTRIPSY     LYMPHADENECTOMY Bilateral 03/22/2015   Procedure: PELVIC LYMPHADENECTOMY;  Surgeon: Alexis Frock, MD;  Location: [facility];  Service: Urology;  Laterality: Bilateral;   NM MYOVIEW LTD  2011   POLYPECTOMY  09/22/2018   Procedure: POLYPECTOMY;  Surgeon: Aviva Signs, MD;  Location: [facility];  Service: Gastroenterology;;   [place] N/A 03/22/2015   Procedure: ROBOTIC ASSISTED LAPAROSCOPIC RADICAL PROSTATECTOMY WITH INDOCYANINE GREEN DYE INJECTION;  Surgeon: Alexis Frock, MD;  Location: [facility];  Service: Urology;  Laterality: N/A;   ROBOTIC ASSISTED LAPAROSCOPIC LYSIS OF ADHESION  03/22/2015   Procedure: ROBOTIC ASSISTED LAPAROSCOPIC LYSIS OF ADHESION;  Surgeon: Alexis Frock, MD;  Location: [facility];  Service: Urology;;   SHOULDER SURGERY Right    VASECTOMY      Prior to Admission medications   Medication Sig Start Date End Date Taking? Authorizing Provider  amLODipine (NORVASC) 10 MG tablet Take 10 mg by mouth daily.    [provider]  aspirin EC 81 MG tablet Take 1 tablet (81 mg total)  by mouth daily. Swallow whole. 05/16/22   Barb Merino, MD  HYDROcodone-acetaminophen (NORCO) 10-325 MG tablet Take 0.5-1 tablets by mouth every 6 (six) hours as needed for moderate pain. 05/16/22   [provider]  ipratropium-albuterol (DUONEB) 0.5-2.5 (3) MG/3ML SOLN Take 3 mLs by nebulization every 6 (six) hours as needed (SOB/wheezing.). 08/09/22 11/07/22  Barton Dubois, MD  OXYGEN Inhale 4 L into the lungs continuous. 2  liters during the day and 4 liters at night    [provider]  pantoprazole (PROTONIX) 40 MG tablet Take 1 tablet (40 mg total) by mouth daily. Patient taking differently: Take 40 mg by mouth daily as needed (acid reflux). 08/09/22   Barton Dubois, MD  polyethylene glycol-electrolytes (TRILYTE) 420 g solution Take 4,000 mLs by mouth as directed. 08/12/22   Harvel Quale, MD  PRESCRIPTION MEDICATION Hormone therapy injection every 6 months through [facility] due to prostate cancer.    [provider]    Current Facility-Administered Medications  Medication Dose Route Frequency Provider Last Rate Last Admin   0.9 %  sodium chloride infusion (Manually program via Guardrails IV Fluids)   Intravenous Once Veryl Speak, MD       acetaminophen (TYLENOL) tablet 650 mg  650 mg Oral Q6H PRN Tat, Shanon Brow, MD       Or   acetaminophen (TYLENOL) suppository 650 mg  650 mg Rectal Q6H PRN Tat, Shanon Brow, MD       insulin aspart (novoLOG) injection 0-9 Units  0-9 Units Subcutaneous TID WC Tat, David, MD       ipratropium-albuterol (DUONEB) 0.5-2.5 (3) MG/3ML nebulizer solution 3 mL  3 mL Nebulization Q6H PRN Tat, Shanon Brow, MD       ondansetron (ZOFRAN) tablet 4 mg  4 mg Oral Q6H PRN Tat, David, MD       Or   ondansetron (ZOFRAN) injection 4 mg  4 mg Intravenous Q6H PRN Tat, Shanon Brow, MD       pantoprazole (PROTONIX) injection 40 mg  40 mg Intravenous Therisa Doyne, MD   40 mg at 10/12/22 6644   Current Outpatient Medications  Medication Sig Dispense Refill   amLODipine (NORVASC) 10 MG tablet Take 10 mg by mouth daily.     aspirin EC 81 MG tablet Take 1 tablet (81 mg total) by mouth daily. Swallow whole. 30 tablet 12   HYDROcodone-acetaminophen (NORCO) 10-325 MG tablet Take 0.5-1 tablets by mouth every 6 (six) hours as needed for moderate pain.     ipratropium-albuterol (DUONEB) 0.5-2.5 (3) MG/3ML SOLN Take 3 mLs by nebulization every 6 (six) hours as needed (SOB/wheezing.). 360  mL 2   OXYGEN Inhale 4 L into the lungs continuous. 2 liters during the day and 4 liters at night     pantoprazole (PROTONIX) 40 MG tablet Take 1 tablet (40 mg total) by mouth daily. (Patient taking differently: Take 40 mg by mouth daily as needed (acid reflux).) 30 tablet 2   polyethylene glycol-electrolytes (TRILYTE) 420 g solution Take 4,000 mLs by mouth as directed. 4000 mL 0   PRESCRIPTION MEDICATION Hormone therapy injection every 6 months through [facility] due to prostate cancer.      Allergies as of 10/11/2022 - Review Complete 10/11/2022  Allergen Reaction Noted   Lidocaine Hives 01/03/2015   Benzocaine Other (See Comments) 03/20/2015   Other  07/16/2021    Family History  Problem Relation Age of Onset   Heart failure Father    Stroke Father    Colon  cancer Neg Hx     Social History   Socioeconomic History   Marital status: Married    Spouse name: Not on file   Number of children: Not on file   Years of education: Not on file   Highest education level: Not on file  Occupational History   Not on file  Tobacco Use   Smoking status: Former    Packs/day: 1.00    Years: 55.00    Total pack years: 55.00    Types: Cigarettes    Passive exposure: Past   Smokeless tobacco: Never  Vaping Use   Vaping Use: Never used  Substance and Sexual Activity   Alcohol use: Not Currently    Alcohol/week: 1.0 standard drink of alcohol    Types: 1 Glasses of wine per week    Comment: one glass of wine once a month   Drug use: No   Sexual activity: Not on file  Other Topics Concern   Not on file  Social History Narrative   Not on file   Social Determinants of Health   Financial Resource Strain: Not on file  Food Insecurity: No Food Insecurity (08/09/2022)   Hunger Vital Sign    Worried About Running Out of Food in the Last Year: Never true    Ran Out of Food in the Last Year: Never true  Transportation Needs: No Transportation Needs (08/09/2022)   PRAPARE -  Hydrologist (Medical): No    Lack of Transportation (Non-Medical): No  Physical Activity: Not on file  Stress: Not on file  Social Connections: Not on file  Intimate Partner Violence: Not At Risk (08/09/2022)   Humiliation, Afraid, Rape, and Kick questionnaire    Fear of Current or Ex-Partner: No    Emotionally Abused: No    Physically Abused: No    Sexually Abused: No    Review of Systems: As in history of present illness  Physical Exam: Vital signs in last 24 hours: Temp:  [97.6 F (36.4 C)-99.9 F (37.7 C)] 98.5 F (36.9 C) (12/30 0912) Pulse Rate:  [75-104] 91 (12/30 0912) Resp:  [12-31] 20 (12/30 0912) BP: (102-173)/(40-90) 136/47 (12/30 0912) SpO2:  [79 %-97 %] 96 % (12/30 0912) Weight:  [108.9 kg] 108.9 kg (12/29 2202)   General:   Alert,  pleasant and cooperative in NAD; found patient sitting on the side of his bed ; ED room 11 Conjunctiva-pale Heart:  Regular rate and rhythm; no murmurs, clicks, rubs,  or gallops. Abdomen: Significantly obese.  Positive bowel sounds soft and nontender no obvious mass organomegaly .   Rectal: Done in ED-Hemoccult negative Intake/Output from previous day: 12/29 0701 - 12/30 0700 In: 315 [Blood:315] Out: 300 [Urine:300] Intake/Output this shift: No intake/output data recorded.  Lab Results: Recent Labs    10/11/22 2223  WBC 9.7  HGB 6.3*  HCT 22.7*  PLT 299   BMET Recent Labs    10/11/22 2223  NA 137  K 4.4  CL 94*  CO2 35*  GLUCOSE 211*  BUN 16  CREATININE 1.18  CALCIUM 8.7*   LFT No results for input(s): "PROT", "ALBUMIN", "AST", "ALT", "ALKPHOS", "BILITOT", "BILIDIR", "IBILI" in the last 72 hours. PT/INR No results for input(s): "LABPROT", "INR" in the last 72 hours. Hepatitis Panel No results for input(s): "HEPBSAG", "HCVAB", "HEPAIGM", "HEPBIGM" in the last 72 hours. C-Diff No results for input(s): "CDIFFTOX" in the last 72 hours.  Studies/Results: DG Chest 2  View  Result Date:  10/11/2022 CLINICAL DATA:  Shortness of breath EXAM: CHEST - 2 VIEW COMPARISON:  08/08/2022 FINDINGS: Cardiac shadow is stable. Blunting of the left costophrenic angle is noted consistent with small pleural effusion. Mild opacity is noted in the left mid lung stable from prior CT. No focal infiltrate is seen. Postsurgical changes in the cervical spine are noted. IMPRESSION: Stable opacity left mid lung likely related to scarring. New small left effusion is seen. Electronically Signed   By: Inez Catalina M.D.   On: 10/11/2022 22:52   Notice:  This dictation was prepared with Dragon dictation along with smaller phrase technology. Any transcriptional errors that result from this process are unintentional and may not be corrected upon review.   Impression: Pleasant 71 year old obese gentleman with iron deficiency anemia in the setting of multiple medical problems including O2 dependent COPD,.  Obstructive sleep apnea-noncompliant with CPAP, chronic kidney disease stage III, extremely limited exercise capacity.  Patient presents with profound iron deficiency.  He required transfusion earlier this year.  No prior EGD.  Colonoscopy delayed as outlined above.  He denies melena or rectal bleeding recently.  Hemoccult negative now and back in October.  History of colonic adenomas removed previously.  Longstanding GERD without any other alarm upper GI tract symptoms.    IDA may or may not be to GI blood loss.  However, he certainly needs a GI evaluation-ideally, while he is here.  Patient is not fit for endoscopy at this time.  Recommendations:  Patient needs further optimization..  In this clinical setting, would consider third unit of packed RBCs to facilitate pulmonary function.  Agree with PPI empirically.  Plan for a EGD and colonoscopy prior to discharge.  I foresee completing EGD and colonoscopy no sooner than January 1.  Keep him on a clear liquid diet.  We will reassess him  tomorrow morning.

## 2022-10-12 NOTE — ED Provider Notes (Signed)
[facility] EMERGENCY DEPARTMENT Provider Note   CSN: [id number] Arrival date & time: 10/11/22  2135     History  Chief Complaint  Patient presents with   Palpitations    Mark Cannon is a 71 y.o. male.  Patient is a 71 year old male with past medical history of prostate cancer, COPD, hypertension, anemia.  Patient presenting today with complaints of shortness of breath and weakness.  This has been worsening over the past few days.  Patient was admitted with similar complaints several months ago and was found to have a low hemoglobin requiring transfusion.  He was also found to be hypoxic and was discharged to home on oxygen.  He tells me he has seen a pulmonologist who is recommended pulmonary function testing, however this has not been performed.  He denies to me he is having any fevers, chills, or cough.  He does feel fatigued and becomes dyspneic with ambulating short distances.  His heart also begins to race when he ambulates.  The history is provided by the patient.       Home Medications Prior to Admission medications   Medication Sig Start Date End Date Taking? Authorizing Provider  amLODipine (NORVASC) 10 MG tablet Take 10 mg by mouth daily.    [provider]  aspirin EC 81 MG tablet Take 1 tablet (81 mg total) by mouth daily. Swallow whole. 05/16/22   Barb Merino, MD  HYDROcodone-acetaminophen (NORCO) 10-325 MG tablet Take 0.5-1 tablets by mouth every 6 (six) hours as needed for moderate pain. 05/16/22   [provider]  ipratropium-albuterol (DUONEB) 0.5-2.5 (3) MG/3ML SOLN Take 3 mLs by nebulization every 6 (six) hours as needed (SOB/wheezing.). 08/09/22 11/07/22  Barton Dubois, MD  OXYGEN Inhale 4 L into the lungs continuous. 2 liters during the day and 4 liters at night    [provider]  pantoprazole (PROTONIX) 40 MG tablet Take 1 tablet (40 mg total) by mouth daily. Patient taking differently: Take 40 mg by mouth daily as needed (acid  reflux). 08/09/22   Barton Dubois, MD  polyethylene glycol-electrolytes (TRILYTE) 420 g solution Take 4,000 mLs by mouth as directed. 08/12/22   Harvel Quale, MD  PRESCRIPTION MEDICATION Hormone therapy injection every 6 months through [facility] due to prostate cancer.    [provider]      Allergies    Lidocaine, Benzocaine, and Other    Review of Systems   Review of Systems  All other systems reviewed and are negative.   Physical Exam Updated Vital Signs BP (!) 143/40   Pulse 91   Temp 98.7 F (37.1 C) (Oral)   Resp 16   Ht '5\' 8"'$  (1.727 m)   Wt 108.9 kg   SpO2 94%   BMI 36.49 kg/m  Physical Exam Vitals and nursing note reviewed.  Constitutional:      General: He is not in acute distress.    Appearance: He is well-developed. He is not diaphoretic.  HENT:     Head: Normocephalic and atraumatic.  Cardiovascular:     Rate and Rhythm: Normal rate and regular rhythm.     Heart sounds: No murmur heard.    No friction rub.  Pulmonary:     Effort: Pulmonary effort is normal. No respiratory distress.     Breath sounds: Normal breath sounds. No wheezing or rales.  Abdominal:     General: Bowel sounds are normal. There is no distension.     Palpations: Abdomen is soft.  Tenderness: There is no abdominal tenderness.  Genitourinary:    Rectum: Normal.     Comments: Heme-negative stool. Musculoskeletal:        General: Normal range of motion.     Cervical back: Normal range of motion and neck supple.  Skin:    General: Skin is warm and dry.  Neurological:     Mental Status: He is alert and oriented to person, place, and time.     Coordination: Coordination normal.     ED Results / Procedures / Treatments   Labs (all labs ordered are listed, but only abnormal results are displayed) Labs Reviewed  BASIC METABOLIC PANEL - Abnormal; Notable for the following components:      Result Value   Chloride 94 (*)    CO2 35 (*)    Glucose, Bld  211 (*)    Calcium 8.7 (*)    All other components within normal limits  CBC - Abnormal; Notable for the following components:   RBC 2.76 (*)    Hemoglobin 6.3 (*)    HCT 22.7 (*)    MCH 22.8 (*)    MCHC 27.8 (*)    RDW 15.6 (*)    nRBC 0.3 (*)    All other components within normal limits  POC OCCULT BLOOD, ED  TYPE AND SCREEN    EKG EKG Interpretation  Date/Time:  Friday October 11 2022 22:08:04 EST Ventricular Rate:  92 PR Interval:  198 QRS Duration: 70 QT Interval:  334 QTC Calculation: 413 R Axis:   51 Text Interpretation: Sinus rhythm with Premature supraventricular complexes and with occasional Premature ventricular complexes Low voltage QRS Nonspecific ST and T wave abnormality Abnormal ECG When compared with ECG of 08-Aug-2022 15:45, no significant change is noted Confirmed by Veryl Speak [phone]) on 10/12/2022 2:00:18 AM  Radiology DG Chest 2 View  Result Date: 10/11/2022 CLINICAL DATA:  Shortness of breath EXAM: CHEST - 2 VIEW COMPARISON:  08/08/2022 FINDINGS: Cardiac shadow is stable. Blunting of the left costophrenic angle is noted consistent with small pleural effusion. Mild opacity is noted in the left mid lung stable from prior CT. No focal infiltrate is seen. Postsurgical changes in the cervical spine are noted. IMPRESSION: Stable opacity left mid lung likely related to scarring. New small left effusion is seen. Electronically Signed   By: Inez Catalina M.D.   On: 10/11/2022 22:52    Procedures Procedures    Medications Ordered in ED Medications  0.9 %  sodium chloride infusion (Manually program via Guardrails IV Fluids) (has no administration in time range)    ED Course/ Medical Decision Making/ A&P  Patient is a 71 year old male with past medical history as per HPI presenting with complaints of shortness of breath/dyspnea on exertion.  This has been worsening over the past several days.  He has received transfusion in the past for symptomatic anemia  with a similar presentation.  Patient arrives here with stable vital signs and no hypoxia.  His physical examination is basically unremarkable.  Workup initiated including CBC, metabolic panel, showing significant anemia with hemoglobin of 6.3, is unremarkable.  Chest x-ray shows no acute process.  Patient's dyspnea on exertion most likely related to his anemia.  I did perform a rectal examination showing brown, heme-negative stool.  I doubt acute GI blood loss as the cause.  Patient will be admitted for transfusion.  I have spoken with the hospitalist who agrees to admit.  CRITICAL CARE Performed by: Veryl Speak Total critical care  time: 35 minutes Critical care time was exclusive of separately billable procedures and treating other patients. Critical care was necessary to treat or prevent imminent or life-threatening deterioration. Critical care was time spent personally by me on the following activities: development of treatment plan with patient and/or surrogate as well as nursing, discussions with consultants, evaluation of patient's response to treatment, examination of patient, obtaining history from patient or surrogate, ordering and performing treatments and interventions, ordering and review of laboratory studies, ordering and review of radiographic studies, pulse oximetry and re-evaluation of patient's condition.   Final Clinical Impression(s) / ED Diagnoses Final diagnoses:  None    Rx / DC Orders ED Discharge Orders     None         Veryl Speak, MD 10/12/22 [phone]

## 2022-10-12 NOTE — H&P (Addendum)
History and Physical    Patient: Mark Cannon YYQ:[id number] DOB: [birth year] DOA: 10/11/2022 DOS: the patient was seen and examined on 10/12/2022 PCP: Jake Samples, PA-C  Patient coming from: Home  Chief Complaint:  Chief Complaint  Patient presents with   Palpitations   HPI: Mark Cannon is a 71 year old male with a history of hypertension, hyperlipidemia, prostate cancer status post prostatectomy, OSA, CKD stage III, chronic respiratory failure on 4 L, tobacco abuse in remission presenting with dyspnea on exertion over the past 2 to 3 days prior to admission.  The patient states that he had a similar presentation back in October 2023 when he was admitted for symptomatic anemia.  He was transfused with 1 unit PRBC at that time and discharged home in stable condition.  GI workup was deferred to the outpatient setting.  He subsequently followed up with [facility] in the office on 08/12/2022.  Colonoscopy was planned given his history of iron deficiency anemia.  However this was delayed due to his poorly controlled sugars. He presents again with generalized weakness and dyspnea on exertion.  He denies any hematochezia, melena, hematemesis, hematuria.  He has not had any fevers, chills, chest pain, nausea, vomiting, diarrhea, abdominal pain.  He denies any NSAIDs.  He denies any worsening lower extremity edema. In the ED, the patient was afebrile and hemodynamically stable with oxygen saturation 94-96% on 4 L.  WBC 9.7, hemoglobin 6.3, platelets 299,000.  Sodium 137, potassium 4.4, bicarbonate 35, serum creatinine 1.18. FOBT was neg in ED.  GI was consulted to assist with management.  He was transfused with 2 units PRBC  Review of Systems: As mentioned in the history of present illness. All other systems reviewed and are negative. Past Medical History:  Diagnosis Date   Arthritis    Cancer [facility])    Prostate, has had prostate removed, has had radiation and still has cancer,   Colon  polyps    Cough    GERD (gastroesophageal reflux disease)    occasional    H/O tooth extraction week of 03/15/2015    History of kidney stones    Hypercholesteremia    Hypertension    Lumbar stenosis with neurogenic claudication    Pneumonia    Pre-diabetes    Renal disorder    Sleep apnea    No cpap   Past Surgical History:  Procedure Laterality Date   APPENDECTOMY     BACK SURGERY     CHOLECYSTECTOMY     COLONOSCOPY  02/04/2012   Procedure: COLONOSCOPY;  Surgeon: Jamesetta So, MD;  [facility];  Service: Gastroenterology;  Laterality: N/A;   COLONOSCOPY N/A 09/22/2018   Procedure: COLONOSCOPY;  Surgeon: Aviva Signs, MD;  [facility];  Service: Gastroenterology;  Laterality: N/A;   COLONOSCOPY W/ POLYPECTOMY     HERNIA REPAIR     Bilateral inguinal hernia repair X 4   hydrocelectomy     left knee arthroscopy     LITHOTRIPSY     LYMPHADENECTOMY Bilateral 03/22/2015   Procedure: PELVIC LYMPHADENECTOMY;  Surgeon: Alexis Frock, MD;  [facility];  Service: Urology;  Laterality: Bilateral;   NM MYOVIEW LTD  2011   POLYPECTOMY  09/22/2018   Procedure: POLYPECTOMY;  Surgeon: Aviva Signs, MD;  [facility];  Service: Gastroenterology;;   [place] N/A 03/22/2015   Procedure: ROBOTIC ASSISTED LAPAROSCOPIC RADICAL PROSTATECTOMY WITH INDOCYANINE GREEN DYE INJECTION;  Surgeon: Alexis Frock, MD;  Location:  [facility];  Service: Urology;  Laterality: N/A;   ROBOTIC ASSISTED LAPAROSCOPIC LYSIS OF ADHESION  03/22/2015   Procedure: ROBOTIC ASSISTED LAPAROSCOPIC LYSIS OF ADHESION;  Surgeon: Alexis Frock, MD;  [facility];  Service: Urology;;   SHOULDER SURGERY Right    VASECTOMY     Social History:  reports that he has quit smoking. His smoking use included cigarettes. He has a 55.00 pack-year smoking history. He has been exposed to tobacco smoke. He has never used smokeless tobacco. He reports that he  does not currently use alcohol after a past usage of about 1.0 standard drink of alcohol per week. He reports that he does not use drugs.  Allergies  Allergen Reactions   Lidocaine Hives   Benzocaine Other (See Comments)    Blisters    Other     Novocaine - blisters in mouth    Family History  Problem Relation Age of Onset   Heart failure Father    Stroke Father    Colon cancer Neg Hx     Prior to Admission medications   Medication Sig Start Date End Date Taking? Authorizing Provider  amLODipine (NORVASC) 10 MG tablet Take 10 mg by mouth daily.    [provider]  aspirin EC 81 MG tablet Take 1 tablet (81 mg total) by mouth daily. Swallow whole. 05/16/22   Barb Merino, MD  HYDROcodone-acetaminophen (NORCO) 10-325 MG tablet Take 0.5-1 tablets by mouth every 6 (six) hours as needed for moderate pain. 05/16/22   [provider]  ipratropium-albuterol (DUONEB) 0.5-2.5 (3) MG/3ML SOLN Take 3 mLs by nebulization every 6 (six) hours as needed (SOB/wheezing.). 08/09/22 11/07/22  Barton Dubois, MD  OXYGEN Inhale 4 L into the lungs continuous. 2 liters during the day and 4 liters at night    [provider]  pantoprazole (PROTONIX) 40 MG tablet Take 1 tablet (40 mg total) by mouth daily. Patient taking differently: Take 40 mg by mouth daily as needed (acid reflux). 08/09/22   Barton Dubois, MD  polyethylene glycol-electrolytes (TRILYTE) 420 g solution Take 4,000 mLs by mouth as directed. 08/12/22   Harvel Quale, MD  PRESCRIPTION MEDICATION Hormone therapy injection every 6 months through [facility] urology due to prostate cancer.    [provider]    Physical Exam: Vitals:   10/12/22 0962 10/12/22 0738 10/12/22 0739 10/12/22 0740  BP:      Pulse: (!) 102 98 93 75  Resp: (!) '21 19 14 14  '$ Temp:      TempSrc:      SpO2: 93% 95% 96% 96%  Weight:      Height:       GENERAL:  A&O x 3, NAD, well developed, cooperative, follows commands HEENT:  [place]/AT, No thrush, No icterus, No oral ulcers Neck:  No neck mass, No meningismus, soft, supple CV: RRR, no S3, no S4, no rub, no JVD Lungs: Diminished breath sounds bilaterally, no wheezing Abd: soft/NT +BS, nondistended Ext: trace LE edema, no lymphangitis, no cyanosis, no rashes Neuro:  CN II-XII intact, strength 4/5 in RUE, RLE, strength 4/5 LUE, LLE; sensation intact bilateral; no dysmetria; babinski equivocal  Data Reviewed: Data reviewed above in history  Assessment and Plan: Symptomatic anemia -Patient has chronic blood loss -Hemoglobin 8.9 at the time of his discharge on 08/09/2022 -Check iron studies -Colonoscopy was planned in the outpatient setting, but delayed secondary to poorly controlled diabetes -GI consult -Transfused 2 units PRBC -clear liquids for now -pantoprazole IV bid  Chronic respiratory failure with hypoxia -Chronically on 4 L nasal cannula at home -stable  CKD stage IIIa -Baseline creatinine 1.1-1.4  Uncontrolled diabetes mellitus type 2 with hyperglycemia -Holding Amaryl -05/14/2022 A1c 7.7 -NovoLog sliding scale -Repeat A1c  Essential hypertension -Holding amlodipine secondary to soft blood pressure  COPD -stable -he is not on any maintenance medications at home -Quit smoking in September 2023 after 50 pack years  History of stroke -Intolerant to statin -Holding aspirin temporarily  GERD -Continue pantoprazole  Hyperlipidemia -Intolerant to statin  Class II obesity -BMI 36.49 -Lifestyle modification  OSA Not on CPAP secondary to intolerance.   Advance Care Planning: FULL CODE  Consults: GI  Family Communication: none  Severity of Illness: The appropriate patient status for this patient is OBSERVATION. Observation status is judged to be reasonable and necessary in order to provide the required intensity of service to ensure the patient's safety. The patient's presenting symptoms, physical exam findings, and initial  radiographic and laboratory data in the context of their medical condition is felt to place them at decreased risk for further clinical deterioration. Furthermore, it is anticipated that the patient will be medically stable for discharge from the hospital within 2 midnights of admission.   Author: Orson Eva, MD 10/12/2022 8:06 AM  For on call review [email or web address].

## 2022-10-12 NOTE — ED Notes (Signed)
Received report from [employer], pt a/o. Denies pain or needs at this time. Nad. Color slightly pale. Aware awaiting admission bed. Per Dr Stark Jock, he performed guiac and was negative. Pt receiving 2nd unit of blood at this time. Pt usually walks with cane at home

## 2022-10-12 NOTE — H&P (View-Only) (Signed)
Referring Provider: No ref. provider found Primary Care Physician:  Scherrie Bateman Primary Gastroenterologist:  Doneen Poisson  Reason for Consultation: Iron deficiency anemia  HPI: 71 year old gentleman with a past medical history significant for GERD prostate cancer status post prostatectomy and XRT, hypertension, O2 dependent COPD/OCA-noncompliant with CPAP him to the hospital yesterday with progressive fusion of dyspnea on exertion. It admission hemoglobin 6.5; he is received 2 units of packed red blood cells hemoglobin 8.5.  Stool for occult blood testing negative in the ED this admission and negative back in October.  Patient denies melena or rectal bleeding although he did have small-volume rectal bleeding elated to prostate irradiation last year.  He does have chronic GERD for which she takes Protonix on a sporadic basis.  No dysphagia, odynophagia, nausea or vomiting.  He denies abdominal pain. Last colonoscopy 2019-Dr. Jenkins-multiple small adenomas removed. Plans for colonoscopy earlier this year were derailed by metabolic abnormality-elevated blood sugars. Has marked exercise limitation. ED staff is noted desaturation on 4 L nasal prongs by just getting out of bed and moving towards the door (after 2 unit transfusion).  Standing GERD-no prior EGD.   Past Medical History:  Diagnosis Date   Arthritis    Cancer [facility])    Prostate, has had prostate removed, has had radiation and still has cancer,   Colon polyps    Cough    GERD (gastroesophageal reflux disease)    occasional    H/O tooth extraction week of 03/15/2015    History of kidney stones    Hypercholesteremia    Hypertension    Lumbar stenosis with neurogenic claudication    Pneumonia    Pre-diabetes    Renal disorder    Sleep apnea    No cpap    Past Surgical History:  Procedure Laterality Date   APPENDECTOMY     BACK SURGERY     CHOLECYSTECTOMY     COLONOSCOPY  02/04/2012   Procedure:  COLONOSCOPY;  Surgeon: Jamesetta So, MD;  [facility];  Service: Gastroenterology;  Laterality: N/A;   COLONOSCOPY N/A 09/22/2018   Procedure: COLONOSCOPY;  Surgeon: Aviva Signs, MD;  [facility];  Service: Gastroenterology;  Laterality: N/A;   COLONOSCOPY W/ POLYPECTOMY     HERNIA REPAIR     Bilateral inguinal hernia repair X 4   hydrocelectomy     left knee arthroscopy     LITHOTRIPSY     LYMPHADENECTOMY Bilateral 03/22/2015   Procedure: PELVIC LYMPHADENECTOMY;  Surgeon: Alexis Frock, MD;  [facility];  Service: Urology;  Laterality: Bilateral;   NM MYOVIEW LTD  2011   POLYPECTOMY  09/22/2018   Procedure: POLYPECTOMY;  Surgeon: Aviva Signs, MD;  [facility];  Service: Gastroenterology;;   [place] N/A 03/22/2015   Procedure: ROBOTIC ASSISTED LAPAROSCOPIC RADICAL PROSTATECTOMY WITH INDOCYANINE GREEN DYE INJECTION;  Surgeon: Alexis Frock, MD;  [facility];  Service: Urology;  Laterality: N/A;   ROBOTIC ASSISTED LAPAROSCOPIC LYSIS OF ADHESION  03/22/2015   Procedure: ROBOTIC ASSISTED LAPAROSCOPIC LYSIS OF ADHESION;  Surgeon: Alexis Frock, MD;  [facility];  Service: Urology;;   SHOULDER SURGERY Right    VASECTOMY      Prior to Admission medications   Medication Sig Start Date End Date Taking? Authorizing Provider  amLODipine (NORVASC) 10 MG tablet Take 10 mg by mouth daily.    [provider]  aspirin EC 81 MG tablet Take 1 tablet (81 mg total)  by mouth daily. Swallow whole. 05/16/22   Barb Merino, MD  HYDROcodone-acetaminophen (NORCO) 10-325 MG tablet Take 0.5-1 tablets by mouth every 6 (six) hours as needed for moderate pain. 05/16/22   [provider]  ipratropium-albuterol (DUONEB) 0.5-2.5 (3) MG/3ML SOLN Take 3 mLs by nebulization every 6 (six) hours as needed (SOB/wheezing.). 08/09/22 11/07/22  Barton Dubois, MD  OXYGEN Inhale 4 L into the lungs continuous. 2  liters during the day and 4 liters at night    [provider]  pantoprazole (PROTONIX) 40 MG tablet Take 1 tablet (40 mg total) by mouth daily. Patient taking differently: Take 40 mg by mouth daily as needed (acid reflux). 08/09/22   Barton Dubois, MD  polyethylene glycol-electrolytes (TRILYTE) 420 g solution Take 4,000 mLs by mouth as directed. 08/12/22   Harvel Quale, MD  PRESCRIPTION MEDICATION Hormone therapy injection every 6 months through [facility] urology due to prostate cancer.    [provider]    Current Facility-Administered Medications  Medication Dose Route Frequency Provider Last Rate Last Admin   0.9 %  sodium chloride infusion (Manually program via Guardrails IV Fluids)   Intravenous Once Veryl Speak, MD       acetaminophen (TYLENOL) tablet 650 mg  650 mg Oral Q6H PRN Tat, Shanon Brow, MD       Or   acetaminophen (TYLENOL) suppository 650 mg  650 mg Rectal Q6H PRN Tat, Shanon Brow, MD       insulin aspart (novoLOG) injection 0-9 Units  0-9 Units Subcutaneous TID WC Tat, David, MD       ipratropium-albuterol (DUONEB) 0.5-2.5 (3) MG/3ML nebulizer solution 3 mL  3 mL Nebulization Q6H PRN Tat, Shanon Brow, MD       ondansetron (ZOFRAN) tablet 4 mg  4 mg Oral Q6H PRN Tat, David, MD       Or   ondansetron (ZOFRAN) injection 4 mg  4 mg Intravenous Q6H PRN Tat, Shanon Brow, MD       pantoprazole (PROTONIX) injection 40 mg  40 mg Intravenous Therisa Doyne, MD   40 mg at 10/12/22 8841   Current Outpatient Medications  Medication Sig Dispense Refill   amLODipine (NORVASC) 10 MG tablet Take 10 mg by mouth daily.     aspirin EC 81 MG tablet Take 1 tablet (81 mg total) by mouth daily. Swallow whole. 30 tablet 12   HYDROcodone-acetaminophen (NORCO) 10-325 MG tablet Take 0.5-1 tablets by mouth every 6 (six) hours as needed for moderate pain.     ipratropium-albuterol (DUONEB) 0.5-2.5 (3) MG/3ML SOLN Take 3 mLs by nebulization every 6 (six) hours as needed (SOB/wheezing.). 360  mL 2   OXYGEN Inhale 4 L into the lungs continuous. 2 liters during the day and 4 liters at night     pantoprazole (PROTONIX) 40 MG tablet Take 1 tablet (40 mg total) by mouth daily. (Patient taking differently: Take 40 mg by mouth daily as needed (acid reflux).) 30 tablet 2   polyethylene glycol-electrolytes (TRILYTE) 420 g solution Take 4,000 mLs by mouth as directed. 4000 mL 0   PRESCRIPTION MEDICATION Hormone therapy injection every 6 months through [facility] due to prostate cancer.      Allergies as of 10/11/2022 - Review Complete 10/11/2022  Allergen Reaction Noted   Lidocaine Hives 01/03/2015   Benzocaine Other (See Comments) 03/20/2015   Other  07/16/2021    Family History  Problem Relation Age of Onset   Heart failure Father    Stroke Father    Colon  cancer Neg Hx     Social History   Socioeconomic History   Marital status: Married    Spouse name: Not on file   Number of children: Not on file   Years of education: Not on file   Highest education level: Not on file  Occupational History   Not on file  Tobacco Use   Smoking status: Former    Packs/day: 1.00    Years: 55.00    Total pack years: 55.00    Types: Cigarettes    Passive exposure: Past   Smokeless tobacco: Never  Vaping Use   Vaping Use: Never used  Substance and Sexual Activity   Alcohol use: Not Currently    Alcohol/week: 1.0 standard drink of alcohol    Types: 1 Glasses of wine per week    Comment: one glass of wine once a month   Drug use: No   Sexual activity: Not on file  Other Topics Concern   Not on file  Social History Narrative   Not on file   Social Determinants of Health   Financial Resource Strain: Not on file  Food Insecurity: No Food Insecurity (08/09/2022)   Hunger Vital Sign    Worried About Running Out of Food in the Last Year: Never true    Ran Out of Food in the Last Year: Never true  Transportation Needs: No Transportation Needs (08/09/2022)   PRAPARE -  Hydrologist (Medical): No    Lack of Transportation (Non-Medical): No  Physical Activity: Not on file  Stress: Not on file  Social Connections: Not on file  Intimate Partner Violence: Not At Risk (08/09/2022)   Humiliation, Afraid, Rape, and Kick questionnaire    Fear of Current or Ex-Partner: No    Emotionally Abused: No    Physically Abused: No    Sexually Abused: No    Review of Systems: As in history of present illness  Physical Exam: Vital signs in last 24 hours: Temp:  [97.6 F (36.4 C)-99.9 F (37.7 C)] 98.5 F (36.9 C) (12/30 0912) Pulse Rate:  [75-104] 91 (12/30 0912) Resp:  [12-31] 20 (12/30 0912) BP: (102-173)/(40-90) 136/47 (12/30 0912) SpO2:  [79 %-97 %] 96 % (12/30 0912) Weight:  [108.9 kg] 108.9 kg (12/29 2202)   General:   Alert,  pleasant and cooperative in NAD; found patient sitting on the side of his bed ; ED room 11 Conjunctiva-pale Heart:  Regular rate and rhythm; no murmurs, clicks, rubs,  or gallops. Abdomen: Significantly obese.  Positive bowel sounds soft and nontender no obvious mass organomegaly .   Rectal: Done in ED-Hemoccult negative Intake/Output from previous day: 12/29 0701 - 12/30 0700 In: 315 [Blood:315] Out: 300 [Urine:300] Intake/Output this shift: No intake/output data recorded.  Lab Results: Recent Labs    10/11/22 2223  WBC 9.7  HGB 6.3*  HCT 22.7*  PLT 299   BMET Recent Labs    10/11/22 2223  NA 137  K 4.4  CL 94*  CO2 35*  GLUCOSE 211*  BUN 16  CREATININE 1.18  CALCIUM 8.7*   LFT No results for input(s): "PROT", "ALBUMIN", "AST", "ALT", "ALKPHOS", "BILITOT", "BILIDIR", "IBILI" in the last 72 hours. PT/INR No results for input(s): "LABPROT", "INR" in the last 72 hours. Hepatitis Panel No results for input(s): "HEPBSAG", "HCVAB", "HEPAIGM", "HEPBIGM" in the last 72 hours. C-Diff No results for input(s): "CDIFFTOX" in the last 72 hours.  Studies/Results: DG Chest 2  View  Result Date:  10/11/2022 CLINICAL DATA:  Shortness of breath EXAM: CHEST - 2 VIEW COMPARISON:  08/08/2022 FINDINGS: Cardiac shadow is stable. Blunting of the left costophrenic angle is noted consistent with small pleural effusion. Mild opacity is noted in the left mid lung stable from prior CT. No focal infiltrate is seen. Postsurgical changes in the cervical spine are noted. IMPRESSION: Stable opacity left mid lung likely related to scarring. New small left effusion is seen. Electronically Signed   By: Inez Catalina M.D.   On: 10/11/2022 22:52   Notice:  This dictation was prepared with Dragon dictation along with smaller phrase technology. Any transcriptional errors that result from this process are unintentional and may not be corrected upon review.   Impression: Pleasant 71 year old obese gentleman with iron deficiency anemia in the setting of multiple medical problems including O2 dependent COPD,.  Obstructive sleep apnea-noncompliant with CPAP, chronic kidney disease stage III, extremely limited exercise capacity.  Patient presents with profound iron deficiency.  He required transfusion earlier this year.  No prior EGD.  Colonoscopy delayed as outlined above.  He denies melena or rectal bleeding recently.  Hemoccult negative now and back in October.  History of colonic adenomas removed previously.  Longstanding GERD without any other alarm upper GI tract symptoms.    IDA may or may not be to GI blood loss.  However, he certainly needs a GI evaluation-ideally, while he is here.  Patient is not fit for endoscopy at this time.  Recommendations:  Patient needs further optimization..  In this clinical setting, would consider third unit of packed RBCs to facilitate pulmonary function.  Agree with PPI empirically.  Plan for a EGD and colonoscopy prior to discharge.  I foresee completing EGD and colonoscopy no sooner than January 1.  Keep him on a clear liquid diet.  We will reassess him  tomorrow morning.

## 2022-10-12 NOTE — TOC Progression Note (Signed)
  Transition of Care [facility]) Screening Note   Patient Details  Name: Mark Cannon Date of Birth: 1951/10/05   [facility]) CM/SW Contact:    Boneta Lucks, RN Phone Number: 10/12/2022, 12:50 PM    Transition of Care Department [facility]) has reviewed patient and no TOC needs have been identified at this time. We will continue to monitor patient advancement through interdisciplinary progression rounds. If new patient transition needs arise, please place a TOC consult.       Barriers to Discharge: Continued Medical Work up  Expected Discharge Plan and Services       Living arrangements for the past 2 months: [place] Determinants of Health (SDOH) Interventions [place]: No Food Insecurity (08/09/2022)  Housing: Low Risk  (08/09/2022)  Transportation Needs: No Transportation Needs (08/09/2022)  Utilities: Not At Risk (08/09/2022)  Tobacco Use: Medium Risk (10/11/2022)    Readmission Risk Interventions     No data to display

## 2022-10-12 NOTE — Care Management Obs Status (Signed)
[place] NOTIFICATION   Patient Details  Name: DUQUAN GILLOOLY MRN: [id number] Date of Birth: 1951-04-14   Medicare Observation Status Notification Given:  Yes    Boneta Lucks, RN 10/12/2022, 4:34 PM

## 2022-10-13 ENCOUNTER — Inpatient Hospital Stay (HOSPITAL_COMMUNITY): Payer: Medicare Other

## 2022-10-13 ENCOUNTER — Other Ambulatory Visit: Payer: Self-pay

## 2022-10-13 DIAGNOSIS — D62 Acute posthemorrhagic anemia: Secondary | ICD-10-CM | POA: Diagnosis present

## 2022-10-13 DIAGNOSIS — R918 Other nonspecific abnormal finding of lung field: Secondary | ICD-10-CM | POA: Diagnosis not present

## 2022-10-13 DIAGNOSIS — D500 Iron deficiency anemia secondary to blood loss (chronic): Secondary | ICD-10-CM | POA: Diagnosis not present

## 2022-10-13 DIAGNOSIS — Z79899 Other long term (current) drug therapy: Secondary | ICD-10-CM | POA: Diagnosis not present

## 2022-10-13 DIAGNOSIS — D509 Iron deficiency anemia, unspecified: Secondary | ICD-10-CM | POA: Diagnosis present

## 2022-10-13 DIAGNOSIS — Z6836 Body mass index (BMI) 36.0-36.9, adult: Secondary | ICD-10-CM | POA: Diagnosis not present

## 2022-10-13 DIAGNOSIS — E1122 Type 2 diabetes mellitus with diabetic chronic kidney disease: Secondary | ICD-10-CM | POA: Diagnosis present

## 2022-10-13 DIAGNOSIS — E7800 Pure hypercholesterolemia, unspecified: Secondary | ICD-10-CM | POA: Diagnosis present

## 2022-10-13 DIAGNOSIS — K219 Gastro-esophageal reflux disease without esophagitis: Secondary | ICD-10-CM | POA: Diagnosis present

## 2022-10-13 DIAGNOSIS — K5730 Diverticulosis of large intestine without perforation or abscess without bleeding: Secondary | ICD-10-CM | POA: Diagnosis present

## 2022-10-13 DIAGNOSIS — Z8546 Personal history of malignant neoplasm of prostate: Secondary | ICD-10-CM | POA: Diagnosis not present

## 2022-10-13 DIAGNOSIS — I5031 Acute diastolic (congestive) heart failure: Secondary | ICD-10-CM | POA: Diagnosis present

## 2022-10-13 DIAGNOSIS — Z8249 Family history of ischemic heart disease and other diseases of the circulatory system: Secondary | ICD-10-CM | POA: Diagnosis not present

## 2022-10-13 DIAGNOSIS — Z1152 Encounter for screening for COVID-19: Secondary | ICD-10-CM | POA: Diagnosis not present

## 2022-10-13 DIAGNOSIS — J9621 Acute and chronic respiratory failure with hypoxia: Secondary | ICD-10-CM | POA: Diagnosis present

## 2022-10-13 DIAGNOSIS — D649 Anemia, unspecified: Secondary | ICD-10-CM | POA: Diagnosis not present

## 2022-10-13 DIAGNOSIS — D125 Benign neoplasm of sigmoid colon: Secondary | ICD-10-CM | POA: Diagnosis not present

## 2022-10-13 DIAGNOSIS — Z823 Family history of stroke: Secondary | ICD-10-CM | POA: Diagnosis not present

## 2022-10-13 DIAGNOSIS — Z87891 Personal history of nicotine dependence: Secondary | ICD-10-CM | POA: Diagnosis not present

## 2022-10-13 DIAGNOSIS — E1165 Type 2 diabetes mellitus with hyperglycemia: Secondary | ICD-10-CM | POA: Diagnosis present

## 2022-10-13 DIAGNOSIS — J181 Lobar pneumonia, unspecified organism: Secondary | ICD-10-CM | POA: Diagnosis not present

## 2022-10-13 DIAGNOSIS — J449 Chronic obstructive pulmonary disease, unspecified: Secondary | ICD-10-CM | POA: Diagnosis present

## 2022-10-13 DIAGNOSIS — D124 Benign neoplasm of descending colon: Secondary | ICD-10-CM | POA: Diagnosis not present

## 2022-10-13 DIAGNOSIS — I130 Hypertensive heart and chronic kidney disease with heart failure and stage 1 through stage 4 chronic kidney disease, or unspecified chronic kidney disease: Secondary | ICD-10-CM | POA: Diagnosis present

## 2022-10-13 DIAGNOSIS — Z9981 Dependence on supplemental oxygen: Secondary | ICD-10-CM | POA: Diagnosis not present

## 2022-10-13 DIAGNOSIS — G4733 Obstructive sleep apnea (adult) (pediatric): Secondary | ICD-10-CM | POA: Diagnosis present

## 2022-10-13 DIAGNOSIS — Z8673 Personal history of transient ischemic attack (TIA), and cerebral infarction without residual deficits: Secondary | ICD-10-CM | POA: Diagnosis not present

## 2022-10-13 DIAGNOSIS — I10 Essential (primary) hypertension: Secondary | ICD-10-CM | POA: Diagnosis not present

## 2022-10-13 DIAGNOSIS — K635 Polyp of colon: Secondary | ICD-10-CM | POA: Diagnosis present

## 2022-10-13 DIAGNOSIS — E669 Obesity, unspecified: Secondary | ICD-10-CM | POA: Diagnosis present

## 2022-10-13 DIAGNOSIS — D120 Benign neoplasm of cecum: Secondary | ICD-10-CM | POA: Diagnosis not present

## 2022-10-13 DIAGNOSIS — D123 Benign neoplasm of transverse colon: Secondary | ICD-10-CM | POA: Diagnosis not present

## 2022-10-13 DIAGNOSIS — N1831 Chronic kidney disease, stage 3a: Secondary | ICD-10-CM | POA: Diagnosis present

## 2022-10-13 DIAGNOSIS — R002 Palpitations: Secondary | ICD-10-CM | POA: Diagnosis present

## 2022-10-13 LAB — CBC
HCT: 31.1 % — ABNORMAL LOW (ref 39.0–52.0)
Hemoglobin: 9.1 g/dL — ABNORMAL LOW (ref 13.0–17.0)
MCH: 24.8 pg — ABNORMAL LOW (ref 26.0–34.0)
MCHC: 29.3 g/dL — ABNORMAL LOW (ref 30.0–36.0)
MCV: 84.7 fL (ref 80.0–100.0)
Platelets: 346 10*3/uL (ref 150–400)
RBC: 3.67 MIL/uL — ABNORMAL LOW (ref 4.22–5.81)
RDW: 16 % — ABNORMAL HIGH (ref 11.5–15.5)
WBC: 11.7 10*3/uL — ABNORMAL HIGH (ref 4.0–10.5)
nRBC: 0 % (ref 0.0–0.2)

## 2022-10-13 LAB — RESPIRATORY PANEL BY PCR

## 2022-10-13 LAB — BASIC METABOLIC PANEL
Anion gap: 8 (ref 5–15)
BUN: 11 mg/dL (ref 8–23)
CO2: 37 mmol/L — ABNORMAL HIGH (ref 22–32)
Calcium: 9 mg/dL (ref 8.9–10.3)
Chloride: 97 mmol/L — ABNORMAL LOW (ref 98–111)
Creatinine, Ser: 0.97 mg/dL (ref 0.61–1.24)
GFR, Estimated: >60 mL/min (ref >60)
Glucose, Bld: 179 mg/dL — ABNORMAL HIGH (ref 70–99)
Potassium: 4.2 mmol/L (ref 3.5–5.1)
Sodium: 142 mmol/L (ref 135–145)

## 2022-10-13 LAB — GLUCOSE, CAPILLARY
Glucose-Capillary: 161 mg/dL — ABNORMAL HIGH (ref 70–99)
Glucose-Capillary: 194 mg/dL — ABNORMAL HIGH (ref 70–99)
Glucose-Capillary: 147 mg/dL — ABNORMAL HIGH (ref 70–99)
Glucose-Capillary: 134 mg/dL — ABNORMAL HIGH (ref 70–99)

## 2022-10-13 LAB — RESP PANEL BY RT-PCR (RSV, FLU A&B, COVID)  RVPGX2
Influenza A by PCR: NEGATIVE
Influenza B by PCR: NEGATIVE
Resp Syncytial Virus by PCR: NEGATIVE
SARS Coronavirus 2 by RT PCR: NEGATIVE

## 2022-10-13 LAB — BRAIN NATRIURETIC PEPTIDE: B Natriuretic Peptide: 245 pg/mL — ABNORMAL HIGH (ref 0.0–100.0)

## 2022-10-13 LAB — PROCALCITONIN: Procalcitonin: 0.15 ng/mL

## 2022-10-13 MED ORDER — AMLODIPINE BESYLATE 5 MG PO TABS
5.0000 mg | ORAL_TABLET | Freq: Every day | ORAL | Status: DC
  Administered 2022-10-13 – 2022-10-15 (×3): 5 mg via ORAL
  Filled 2022-10-13 (×3): qty 1

## 2022-10-13 MED ORDER — FUROSEMIDE 10 MG/ML IJ SOLN
40.0000 mg | Freq: Once | INTRAMUSCULAR | Status: AC
  Administered 2022-10-13: 40 mg via INTRAVENOUS
  Filled 2022-10-13: qty 4

## 2022-10-13 NOTE — Progress Notes (Addendum)
PROGRESS NOTE  Mark Cannon CMK:[id number] DOB: 25-Nov-1950 DOA: 10/11/2022 PCP: Jake Samples, PA-C  Brief History:  71 year old male with a history of hypertension, hyperlipidemia, prostate cancer status post prostatectomy, OSA, CKD stage III, chronic respiratory failure on 4 L, tobacco abuse in remission presenting with dyspnea on exertion over the past 2 to 3 days prior to admission.  The patient states that he had a similar presentation back in October 2023 when he was admitted for symptomatic anemia.  He was transfused with 1 unit PRBC at that time and discharged home in stable condition.  GI workup was deferred to the outpatient setting.  He subsequently followed up with [facility] in the office on 08/12/2022.  Colonoscopy was planned given his history of iron deficiency anemia.  However this was delayed due to his poorly controlled sugars. He presents again with generalized weakness and dyspnea on exertion.  He denies any hematochezia, melena, hematemesis, hematuria.  He has not had any fevers, chills, chest pain, nausea, vomiting, diarrhea, abdominal pain.  He denies any NSAIDs.  He denies any worsening lower extremity edema. In the ED, the patient was afebrile and hemodynamically stable with oxygen saturation 94-96% on 4 L.  WBC 9.7, hemoglobin 6.3, platelets 299,000.  Sodium 137, potassium 4.4, bicarbonate 35, serum creatinine 1.18. FOBT was neg in ED.  GI was consulted to assist with management.  He was transfused with 2 units PRBC  During the evening 12/30-12/31, patient developed worsening sob and oxygen desaturation with increased oxygen demand up to 9L.   COVID, RSV, Flu were ordered, but CXR and hx/exam suggest fluid overload.  He was given lasix IV x1.   Assessment/Plan: Acute on chronic respiratory failure with hypoxia -due to fluid overload -Check COVID, RSV -personally reviewed CXR--increased interstitial markings, new L-pleural effusion -on 4L at  baseline -no on 9L HFNC  Symptomatic anemia -Patient has chronic blood loss -Hemoglobin 8.9 at the time of his discharge on 08/09/2022 -Check iron studies -Colonoscopy was planned in the outpatient setting, but delayed secondary to poorly controlled diabetes -GI consult appreciated -Transfused 2 units PRBC -clear liquids for now -pantoprazole IV bid -plan endoscopy once pt is optimized from pulm/metabolic standpoint  CKD stage IIIa -Baseline creatinine 1.1-1.4   Uncontrolled diabetes mellitus type 2 with hyperglycemia -Holding Amaryl -05/14/2022 A1c 7.7 -NovoLog sliding scale -Repeat A1c--pending  Iron deficiency Anemia -iron sat 23 -ferritin 8 -iron IV once endoscopic work up done   Essential hypertension -Holding amlodipine secondary to soft blood pressure   COPD -stable -he is not on any maintenance medications at home -Quit smoking in September 2023 after 50 pack years   History of stroke -Intolerant to statin -Holding aspirin temporarily   GERD -Continue pantoprazole   Hyperlipidemia -Intolerant to statin   Class II obesity -BMI 36.49 -Lifestyle modification   OSA Not on CPAP secondary to intolerance.  Family Communication:  no Family at bedside  Consultants:  GI  Code Status:  FULL   DVT Prophylaxis:  SCDs   Procedures: As Listed in Progress Note Above  Antibiotics: None       Subjective: Complains of sob with minimal exertion.  Has orthopnea symptoms.  No cp, f/c, cough, n/v/d, abd pain, hematochezia or melena  Objective: Vitals:   10/12/22 2105 10/12/22 2107 10/13/22 0031 10/13/22 0519  BP:   (!) 151/61 (!) 140/50  Pulse:   98 100  Resp:   20 20  Temp:  98.2 F (36.8 C) 97.9 F (36.6 C)  TempSrc:    Oral  SpO2: 94% 92% (!) 88% (!) 86%  Weight:      Height:        Intake/Output Summary (Last 24 hours) at 10/13/2022 1107 Last data filed at 10/13/2022 1016 Gross per 24 hour  Intake 240 ml  Output --  Net 240 ml    Weight change: -0.68 kg Exam:  General:  Pt is alert, follows commands appropriately, not in acute distress HEENT: No icterus, No thrush, No neck mass, [place]/AT Cardiovascular: RRR, S1/S2, no rubs, no gallops Respiratory: bibasilar crackles L?R. No wheeze Abdomen: Soft/+BS, non tender, non distended, no guarding Extremities: trace LE edema, No lymphangitis, No petechiae, No rashes, no synovitis   Data Reviewed: I have personally reviewed following labs and imaging studies Basic Metabolic Panel: Recent Labs  Lab 10/11/22 2223 10/13/22 0528  NA 137 142  K 4.4 4.2  CL 94* 97*  CO2 35* 37*  GLUCOSE 211* 179*  BUN 16 11  CREATININE 1.18 0.97  CALCIUM 8.7* 9.0   Liver Function Tests: No results for input(s): "AST", "ALT", "ALKPHOS", "BILITOT", "PROT", "ALBUMIN" in the last 168 hours. No results for input(s): "LIPASE", "AMYLASE" in the last 168 hours. No results for input(s): "AMMONIA" in the last 168 hours. Coagulation Profile: No results for input(s): "INR", "PROTIME" in the last 168 hours. CBC: Recent Labs  Lab 10/11/22 2223 10/12/22 1014 10/13/22 0528  WBC 9.7 10.4 11.7*  HGB 6.3* 8.5* 9.1*  HCT 22.7* 28.8* 31.1*  MCV 82.2 83.7 84.7  PLT 299 280 346   Cardiac Enzymes: No results for input(s): "CKTOTAL", "CKMB", "CKMBINDEX", "TROPONINI" in the last 168 hours. BNP: Invalid input(s): "POCBNP" CBG: Recent Labs  Lab 10/12/22 1156 10/12/22 1642 10/12/22 2209 10/13/22 0748  GLUCAP 164* 131* 166* 161*   HbA1C: No results for input(s): "HGBA1C" in the last 72 hours. Urine analysis:    Component Value Date/Time   COLORURINE YELLOW 08/08/2022 1922   APPEARANCEUR CLEAR 08/08/2022 1922   LABSPEC 1.039 (H) 08/08/2022 1922   PHURINE 6.0 08/08/2022 1922   GLUCOSEU NEGATIVE 08/08/2022 [place] NEGATIVE 08/08/2022 [place] NEGATIVE 08/08/2022 [place] NEGATIVE 08/08/2022 1922   PROTEINUR NEGATIVE 08/08/2022 1922   NITRITE NEGATIVE 08/08/2022  1922   LEUKOCYTESUR NEGATIVE 08/08/2022 1922   Sepsis Labs: '@LABRCNTIP'$ (procalcitonin:4,lacticidven:4) )No results found for this or any previous visit (from the past 240 hour(s)).   Scheduled Meds:  sodium chloride   Intravenous Once   insulin aspart  0-9 Units Subcutaneous TID WC   pantoprazole (PROTONIX) IV  40 mg Intravenous Q12H   Continuous Infusions:  Procedures/Studies: DG CHEST PORT 1 VIEW  Result Date: 10/12/2022 CLINICAL DATA:  Acute respiratory EXAM: PORTABLE CHEST 1 VIEW COMPARISON:  10/11/2022 FINDINGS: Cardiac shadow is enlarged but stable. Lungs are well aerated bilaterally. Mild atelectatic changes are noted in the right base. Left-sided effusion is less well visualized on today's exam. Some increasing left basilar opacity is noted. No bony abnormality is seen. Postsurgical changes in the cervical spine are noted. IMPRESSION: Increasing bibasilar opacities left greater than right. Electronically Signed   By: Inez Catalina M.D.   On: 10/12/2022 21:44   DG Chest 2 View  Result Date: 10/11/2022 CLINICAL DATA:  Shortness of breath EXAM: CHEST - 2 VIEW COMPARISON:  08/08/2022 FINDINGS: Cardiac shadow is stable. Blunting of the left costophrenic angle is noted consistent with small pleural effusion. Mild opacity is noted  in the left mid lung stable from prior CT. No focal infiltrate is seen. Postsurgical changes in the cervical spine are noted. IMPRESSION: Stable opacity left mid lung likely related to scarring. New small left effusion is seen. Electronically Signed   By: Inez Catalina M.D.   On: 10/11/2022 22:52    Orson Eva, DO  [facility]  If 7PM-7AM, please contact night-coverage [email or web address] Password [email or web address] 10/13/2022, 11:07 AM   LOS: 0 days

## 2022-10-14 DIAGNOSIS — J9621 Acute and chronic respiratory failure with hypoxia: Secondary | ICD-10-CM | POA: Diagnosis not present

## 2022-10-14 DIAGNOSIS — D500 Iron deficiency anemia secondary to blood loss (chronic): Secondary | ICD-10-CM | POA: Diagnosis not present

## 2022-10-14 DIAGNOSIS — D649 Anemia, unspecified: Secondary | ICD-10-CM | POA: Diagnosis not present

## 2022-10-14 DIAGNOSIS — I5031 Acute diastolic (congestive) heart failure: Secondary | ICD-10-CM | POA: Insufficient documentation

## 2022-10-14 LAB — PHOSPHORUS: Phosphorus: 3.1 mg/dL (ref 2.5–4.6)

## 2022-10-14 LAB — CBC
HCT: 28.4 % — ABNORMAL LOW (ref 39.0–52.0)
Hemoglobin: 8.2 g/dL — ABNORMAL LOW (ref 13.0–17.0)
MCH: 24.3 pg — ABNORMAL LOW (ref 26.0–34.0)
MCHC: 28.9 g/dL — ABNORMAL LOW (ref 30.0–36.0)
MCV: 84 fL (ref 80.0–100.0)
Platelets: 285 10*3/uL (ref 150–400)
RBC: 3.38 MIL/uL — ABNORMAL LOW (ref 4.22–5.81)
RDW: 16.7 % — ABNORMAL HIGH (ref 11.5–15.5)
WBC: 9.6 10*3/uL (ref 4.0–10.5)
nRBC: 0 % (ref 0.0–0.2)

## 2022-10-14 LAB — MAGNESIUM: Magnesium: 2 mg/dL (ref 1.7–2.4)

## 2022-10-14 LAB — GLUCOSE, CAPILLARY
Glucose-Capillary: 145 mg/dL — ABNORMAL HIGH (ref 70–99)
Glucose-Capillary: 127 mg/dL — ABNORMAL HIGH (ref 70–99)
Glucose-Capillary: 124 mg/dL — ABNORMAL HIGH (ref 70–99)

## 2022-10-14 LAB — BASIC METABOLIC PANEL
Anion gap: 7 (ref 5–15)
BUN: 10 mg/dL (ref 8–23)
CO2: 38 mmol/L — ABNORMAL HIGH (ref 22–32)
Calcium: 8.6 mg/dL — ABNORMAL LOW (ref 8.9–10.3)
Chloride: 95 mmol/L — ABNORMAL LOW (ref 98–111)
Creatinine, Ser: 1.11 mg/dL (ref 0.61–1.24)
GFR, Estimated: >60 mL/min (ref >60)
Glucose, Bld: 149 mg/dL — ABNORMAL HIGH (ref 70–99)
Potassium: 3.6 mmol/L (ref 3.5–5.1)
Sodium: 140 mmol/L (ref 135–145)

## 2022-10-14 MED ORDER — FUROSEMIDE 10 MG/ML IJ SOLN
40.0000 mg | Freq: Once | INTRAMUSCULAR | Status: AC
  Filled 2022-10-14: qty 4

## 2022-10-14 MED ORDER — SODIUM CHLORIDE 0.9 % IV SOLN
1.0000 g | INTRAVENOUS | Status: DC
  Administered 2022-10-14: 1 g via INTRAVENOUS
  Filled 2022-10-14: qty 10

## 2022-10-14 MED ORDER — SODIUM CHLORIDE 0.9 % IV SOLN
500.0000 mg | INTRAVENOUS | Status: DC
  Administered 2022-10-14: 500 mg via INTRAVENOUS
  Filled 2022-10-14: qty 5

## 2022-10-14 MED ORDER — ORAL CARE MOUTH RINSE: 15.0000 mL | OROMUCOSAL | Status: DC | PRN

## 2022-10-14 MED ORDER — PEG 3350-KCL-NA BICARB-NACL 420 G PO SOLR
4000.0000 mL | Freq: Once | ORAL | Status: AC
  Administered 2022-10-14: 4000 mL via ORAL
  Filled 2022-10-14: qty 4000

## 2022-10-14 MED ORDER — FUROSEMIDE 10 MG/ML IJ SOLN
INTRAMUSCULAR | Status: AC
  Administered 2022-10-14: 40 mg via INTRAVENOUS
  Filled 2022-10-14: qty 4

## 2022-10-14 NOTE — Progress Notes (Signed)
PROGRESS NOTE  Mark Cannon MVH:[id number] DOB: 11/24/1950 DOA: 10/11/2022 PCP: Jake Samples, PA-C  Brief History:  72 year old male with a history of hypertension, hyperlipidemia, prostate cancer status post prostatectomy, OSA, CKD stage III, chronic respiratory failure on 4 L, tobacco abuse in remission presenting with dyspnea on exertion over the past 2 to 3 days prior to admission.  The patient states that he had a similar presentation back in October 2023 when he was admitted for symptomatic anemia.  He was transfused with 1 unit PRBC at that time and discharged home in stable condition.  GI workup was deferred to the outpatient setting.  He subsequently followed up with [facility] in the office on 08/12/2022.  Colonoscopy was planned given his history of iron deficiency anemia.  However this was delayed due to his poorly controlled sugars. He presents again with generalized weakness and dyspnea on exertion.  He denies any hematochezia, melena, hematemesis, hematuria.  He has not had any fevers, chills, chest pain, nausea, vomiting, diarrhea, abdominal pain.  He denies any NSAIDs.  He denies any worsening lower extremity edema. In the ED, the patient was afebrile and hemodynamically stable with oxygen saturation 94-96% on 4 L.  WBC 9.7, hemoglobin 6.3, platelets 299,000.  Sodium 137, potassium 4.4, bicarbonate 35, serum creatinine 1.18. FOBT was neg in ED.  GI was consulted to assist with management.  He was transfused with 2 units PRBC  During the evening 12/30-12/31, patient developed worsening sob and oxygen desaturation with increased oxygen demand up to 9L.   COVID, RSV, Flu were ordered, but CXR and hx/exam suggest fluid overload.  He was given lasix IV x1.   Assessment/Plan: Acute on chronic respiratory failure with hypoxia -due to fluid overload -PCT 0.15 -Check COVID, RSV, Flu--all neg -CT chest--Right upper lobe masslike consolidation with multiple satellite  nodular opacities within all lobes of the right lung as well as interstitial and interposed ground-glass opacities -on 4L at baseline -now on 9L HFNC>>8L>>5L   Symptomatic anemia -Patient has chronic blood loss -Hemoglobin 8.9 at the time of his discharge on 08/09/2022 -ferritin 8, iron sat 23% -Colonoscopy was planned in the outpatient setting, but delayed secondary to poorly controlled diabetes -GI consult appreciated -Transfused 2 units PRBC -clear liquids for now -pantoprazole IV bid -plan endoscopy once pt is optimized from pulm/metabolic standpoint  Acute HFpEF -remains clinically fluid overloaded -started on IV lasix -05/15/22 Echo--EF 60-65%, G1DD, no WMA, normal RVF -10/14/22 ReDS = 36   CKD stage IIIa -Baseline creatinine 1.1-1.4 -monitor with diuresis  Pulmonary infiltrates -12/31 CT chest--RUL mass like consolidations -clinically not consistent with PNA -give empiric course abx -repeat CT chest 2 months -PCT 0.15   Uncontrolled diabetes mellitus type 2 with hyperglycemia -Holding Amaryl -05/14/2022 A1c 7.7 -NovoLog sliding scale -Repeat A1c--pending   Iron deficiency Anemia -iron sat 23 -ferritin 8 -iron IV once endoscopic work up done   Essential hypertension -Holding amlodipine secondary to soft blood pressure   COPD -stable -he is not on any maintenance medications at home -Quit smoking in September 2023 after 50 pack years   History of stroke -Intolerant to statin -Holding aspirin temporarily   GERD -Continue pantoprazole   Hyperlipidemia -Intolerant to statin   Class II obesity -BMI 36.49 -Lifestyle modification   OSA Not on CPAP secondary to intolerance.   Family Communication:  no Family at bedside   Consultants:  GI   Code Status:  FULL  DVT Prophylaxis:  SCDs     Procedures: As Listed in Progress Note Above   Antibiotics: None            Subjective: Breathing better.  Denies f/c,cp, n/v/d, abd pain.  No  further hematochezia  Objective: Vitals:   10/13/22 1434 10/13/22 1756 10/13/22 1945 10/14/22 0516  BP: (!) 152/103 133/71 125/72 129/64  Pulse: 95  84 97  Resp: '18  17 16  '$ Temp: 98.6 F (37 C)  99.1 F (37.3 C) 99.4 F (37.4 C)  TempSrc: Oral  Oral Oral  SpO2: 98%  96% 93%  Weight:      Height:        Intake/Output Summary (Last 24 hours) at 10/14/2022 1313 Last data filed at 10/14/2022 1006 Gross per 24 hour  Intake 1720 ml  Output 1 ml  Net 1719 ml   Weight change:  Exam:  General:  Pt is alert, follows commands appropriately, not in acute distress HEENT: No icterus, No thrush, No neck mass, [place]/AT Cardiovascular: RRR, S1/S2, no rubs, no gallops Respiratory: bibasilar crackles. No wheeze Abdomen: Soft/+BS, non tender, non distended, no guarding Extremities: 1 + LE edema, No lymphangitis, No petechiae, No rashes, no synovitis   Data Reviewed: I have personally reviewed following labs and imaging studies Basic Metabolic Panel: Recent Labs  Lab 10/11/22 2223 10/13/22 0528 10/14/22 0657  NA 137 142 140  K 4.4 4.2 3.6  CL 94* 97* 95*  CO2 35* 37* 38*  GLUCOSE 211* 179* 149*  BUN '16 11 10  '$ CREATININE 1.18 0.97 1.11  CALCIUM 8.7* 9.0 8.6*  MG  --   --  2.0  PHOS  --   --  3.1   Liver Function Tests: No results for input(s): "AST", "ALT", "ALKPHOS", "BILITOT", "PROT", "ALBUMIN" in the last 168 hours. No results for input(s): "LIPASE", "AMYLASE" in the last 168 hours. No results for input(s): "AMMONIA" in the last 168 hours. Coagulation Profile: No results for input(s): "INR", "PROTIME" in the last 168 hours. CBC: Recent Labs  Lab 10/11/22 2223 10/12/22 1014 10/13/22 0528 10/14/22 0657  WBC 9.7 10.4 11.7* 9.6  HGB 6.3* 8.5* 9.1* 8.2*  HCT 22.7* 28.8* 31.1* 28.4*  MCV 82.2 83.7 84.7 84.0  PLT 299 280 346 285   Cardiac Enzymes: No results for input(s): "CKTOTAL", "CKMB", "CKMBINDEX", "TROPONINI" in the last 168 hours. BNP: Invalid input(s):  "POCBNP" CBG: Recent Labs  Lab 10/13/22 0748 10/13/22 1132 10/13/22 1703 10/13/22 2004 10/14/22 0806  GLUCAP 161* 194* 147* 134* 145*   HbA1C: No results for input(s): "HGBA1C" in the last 72 hours. Urine analysis:    Component Value Date/Time   COLORURINE YELLOW 08/08/2022 1922   APPEARANCEUR CLEAR 08/08/2022 1922   LABSPEC 1.039 (H) 08/08/2022 1922   PHURINE 6.0 08/08/2022 1922   GLUCOSEU NEGATIVE 08/08/2022 [place] NEGATIVE 08/08/2022 [place] NEGATIVE 08/08/2022 [place] NEGATIVE 08/08/2022 [place] NEGATIVE 08/08/2022 1922   NITRITE NEGATIVE 08/08/2022 1922   LEUKOCYTESUR NEGATIVE 08/08/2022 1922   Sepsis Labs: '@LABRCNTIP'$ (procalcitonin:4,lacticidven:4) ) Recent Results (from the past 240 hour(s))  Resp panel by RT-PCR (RSV, Flu A&B, Covid) Anterior Nasal Swab     Status: None   Collection Time: 10/13/22  9:59 AM   Specimen: Anterior Nasal Swab  Result Value Ref Range Status   SARS Coronavirus 2 by RT PCR NEGATIVE NEGATIVE Final    Comment: (NOTE) SARS-CoV-2 target nucleic acids are NOT DETECTED.  The SARS-CoV-2 RNA  is generally detectable in upper respiratory specimens during the acute phase of infection. The lowest concentration of SARS-CoV-2 viral copies this assay can detect is 138 copies/mL. A negative result does not preclude SARS-Cov-2 infection and should not be used as the sole basis for treatment or other patient management decisions. A negative result may occur with  improper specimen collection/handling, submission of specimen other than nasopharyngeal swab, presence of viral mutation(s) within the areas targeted by this assay, and inadequate number of viral copies(<138 copies/mL). A negative result must be combined with clinical observations, patient history, and epidemiological information. The expected result is Negative.  Fact Sheet for Patients:  EntrepreneurPulse.com.au  Fact Sheet for  Healthcare Providers:  IncredibleEmployment.be  This test is no t yet approved or cleared by the [place] FDA and  has been authorized for detection and/or diagnosis of SARS-CoV-2 by FDA under an Emergency Use Authorization (EUA). This EUA will remain  in effect (meaning this test can be used) for the duration of the COVID-19 declaration under Section 564(b)(1) of the Act, 21 U.S.C.section 360bbb-3(b)(1), unless the authorization is terminated  or revoked sooner.       Influenza A by PCR NEGATIVE NEGATIVE Final   Influenza B by PCR NEGATIVE NEGATIVE Final    Comment: (NOTE) The Xpert Xpress SARS-CoV-2/FLU/RSV plus assay is intended as an aid in the diagnosis of influenza from Nasopharyngeal swab specimens and should not be used as a sole basis for treatment. Nasal washings and aspirates are unacceptable for Xpert Xpress SARS-CoV-2/FLU/RSV testing.  Fact Sheet for Patients: EntrepreneurPulse.com.au  Fact Sheet for Healthcare Providers: IncredibleEmployment.be  This test is not yet approved or cleared by the [place] FDA and has been authorized for detection and/or diagnosis of SARS-CoV-2 by FDA under an Emergency Use Authorization (EUA). This EUA will remain in effect (meaning this test can be used) for the duration of the COVID-19 declaration under Section 564(b)(1) of the Act, 21 U.S.C. section 360bbb-3(b)(1), unless the authorization is terminated or revoked.     Resp Syncytial Virus by PCR NEGATIVE NEGATIVE Final    Comment: (NOTE) Fact Sheet for Patients: EntrepreneurPulse.com.au  Fact Sheet for Healthcare Providers: IncredibleEmployment.be  This test is not yet approved or cleared by the [place] FDA and has been authorized for detection and/or diagnosis of SARS-CoV-2 by FDA under an Emergency Use Authorization (EUA). This EUA will remain in effect (meaning this  test can be used) for the duration of the COVID-19 declaration under Section 564(b)(1) of the Act, 21 U.S.C. section 360bbb-3(b)(1), unless the authorization is terminated or revoked.  Performed at [facility], [address], [place], [place] [address]   Respiratory (~20 pathogens) panel by PCR     Status: None   Collection Time: 10/13/22 10:02 AM  Result Value Ref Range Status   Adenovirus NOT DETECTED NOT DETECTED Final   Coronavirus 229E NOT DETECTED NOT DETECTED Final    Comment: (NOTE) The Coronavirus on the Respiratory Panel, DOES NOT test for the novel  Coronavirus (2019 nCoV)    Coronavirus HKU1 NOT DETECTED NOT DETECTED Final   Coronavirus NL63 NOT DETECTED NOT DETECTED Final   Coronavirus OC43 NOT DETECTED NOT DETECTED Final   Metapneumovirus NOT DETECTED NOT DETECTED Final   Rhinovirus / Enterovirus NOT DETECTED NOT DETECTED Final   Influenza A NOT DETECTED NOT DETECTED Final   Influenza B NOT DETECTED NOT DETECTED Final   Parainfluenza Virus 1 NOT DETECTED NOT DETECTED Final   Parainfluenza Virus 2 NOT DETECTED NOT  DETECTED Final   Parainfluenza Virus 3 NOT DETECTED NOT DETECTED Final   Parainfluenza Virus 4 NOT DETECTED NOT DETECTED Final   Respiratory Syncytial Virus NOT DETECTED NOT DETECTED Final   Bordetella pertussis NOT DETECTED NOT DETECTED Final   Bordetella Parapertussis NOT DETECTED NOT DETECTED Final   Chlamydophila pneumoniae NOT DETECTED NOT DETECTED Final   Mycoplasma pneumoniae NOT DETECTED NOT DETECTED Final    Comment: Performed at [facility], [place] [address] [place], [place] [address]     Scheduled Meds:  sodium chloride   Intravenous Once   amLODipine  5 mg Oral Daily   insulin aspart  0-9 Units Subcutaneous TID WC   pantoprazole (PROTONIX) IV  40 mg Intravenous Q12H   polyethylene glycol  4,000 mL Oral Once   Continuous Infusions:  Procedures/Studies: CT CHEST WO CONTRAST  Result Date: 10/13/2022 CLINICAL DATA:  Dyspnea,  respiratory illness. History of prostate cancer. * Tracking Code: BO * EXAM: CT CHEST WITHOUT CONTRAST TECHNIQUE: Multidetector CT imaging of the chest was performed following the standard protocol without IV contrast. RADIATION DOSE REDUCTION: This exam was performed according to the departmental dose-optimization program which includes automated exposure control, adjustment of the mA and/or kV according to patient size and/or use of iterative reconstruction technique. COMPARISON:  CT chest August 08, 2022. FINDINGS: Cardiovascular: Aortic atherosclerosis. Coronary artery calcifications. Normal size heart. No significant pericardial effusion/thickening. Mediastinum/Nodes: No suspicious thyroid nodule. No pathologically enlarged mediastinal, hilar or axillary lymph nodes, noting limited sensitivity for the detection of hilar adenopathy on this noncontrast study. The esophagus is grossly unremarkable. Lungs/Pleura: Right upper lobe masslike consolidation with multiple satellite nodular opacities within all lobes of the right lung as well as interstitial and interposed ground-glass opacities. Small bilateral pleural effusions. Upper Abdomen: Hepatic steatosis with morphologic changes suggestive of cirrhosis. Musculoskeletal: Multilevel degenerative changes spine. Degenerative change of the bilateral shoulders. IMPRESSION: 1. Right upper lobe masslike consolidation with multiple satellite nodular opacities within all lobes of the right lung as well as interstitial and interposed ground-glass opacities. Findings are favored to reflect multifocal pneumonia. However, recommend short-term interval follow-up CT in 6-8 weeks after treatment to ensure resolution and exclude underlying neoplasm. 2. Small bilateral pleural effusions. 3. Hepatic steatosis with morphologic changes suggestive of cirrhosis. 4.  Aortic Atherosclerosis (ICD10-I70.0). Electronically Signed   By: Dahlia Bailiff M.D.   On: 10/13/2022 12:40   DG CHEST  PORT 1 VIEW  Result Date: 10/12/2022 CLINICAL DATA:  Acute respiratory EXAM: PORTABLE CHEST 1 VIEW COMPARISON:  10/11/2022 FINDINGS: Cardiac shadow is enlarged but stable. Lungs are well aerated bilaterally. Mild atelectatic changes are noted in the right base. Left-sided effusion is less well visualized on today's exam. Some increasing left basilar opacity is noted. No bony abnormality is seen. Postsurgical changes in the cervical spine are noted. IMPRESSION: Increasing bibasilar opacities left greater than right. Electronically Signed   By: Inez Catalina M.D.   On: 10/12/2022 21:44   DG Chest 2 View  Result Date: 10/11/2022 CLINICAL DATA:  Shortness of breath EXAM: CHEST - 2 VIEW COMPARISON:  08/08/2022 FINDINGS: Cardiac shadow is stable. Blunting of the left costophrenic angle is noted consistent with small pleural effusion. Mild opacity is noted in the left mid lung stable from prior CT. No focal infiltrate is seen. Postsurgical changes in the cervical spine are noted. IMPRESSION: Stable opacity left mid lung likely related to scarring. New small left effusion is seen. Electronically Signed   By: Linus Mako.D.  On: 10/11/2022 22:52    Orson Eva, DO  [facility]  If 7PM-7AM, please contact night-coverage [email or web address] Password [email or web address] 10/14/2022, 1:13 PM   LOS: 1 day

## 2022-10-14 NOTE — Progress Notes (Signed)
done

## 2022-10-14 NOTE — Progress Notes (Signed)
Pt given Golytely. Ambulated to restroom and had bowel movement.

## 2022-10-14 NOTE — Progress Notes (Signed)
Patient states he is breathing much better today.  Currently on 8 L nasal prongs; O2 saturation 93%.  Mouth breathing  Vital signs in last 24 hours: Temp:  [98.6 F (37 C)-99.4 F (37.4 C)] 99.4 F (37.4 C) (01/01 0516) Pulse Rate:  [84-97] 97 (01/01 0516) Resp:  [16-18] 16 (01/01 0516) BP: (125-152)/(64-103) 129/64 (01/01 0516) SpO2:  [93 %-98 %] 93 % (01/01 0516) Last BM Date : 10/13/22 General:   Found patient sitting in the chair in his room.  Absolutely no acute distress.  Speaking in complete sentences without any objective evidence of dyspnea. Abdomen: Nondistended.  Positive bowel sounds soft and nontender   Intake/Output from previous day: 12/31 0701 - 01/01 0700 In: 1460 [P.O.:1460] Out: 1 [Urine:1] Intake/Output this shift: Total I/O In: 500 [P.O.:500] Out: -   Lab Results: Recent Labs    10/12/22 1014 10/13/22 0528 10/14/22 0657  WBC 10.4 11.7* 9.6  HGB 8.5* 9.1* 8.2*  HCT 28.8* 31.1* 28.4*  PLT 280 346 285   BMET Recent Labs    10/11/22 2223 10/13/22 0528 10/14/22 0657  NA 137 142 140  K 4.4 4.2 3.6  CL 94* 97* 95*  CO2 35* 37* 38*  GLUCOSE 211* 179* 149*  BUN '16 11 10  '$ CREATININE 1.18 0.97 1.11  CALCIUM 8.7* 9.0 8.6*   LFT No results for input(s): "PROT", "ALBUMIN", "AST", "ALT", "ALKPHOS", "BILITOT", "BILIDIR", "IBILI" in the last 72 hours. PT/INR No results for input(s): "LABPROT", "INR" in the last 72 hours. Hepatitis Panel No results for input(s): "HEPBSAG", "HCVAB", "HEPAIGM", "HEPBIGM" in the last 72 hours. C-Diff No results for input(s): "CDIFFTOX" in the last 72 hours.  Studies/Results: CT CHEST WO CONTRAST  Result Date: 10/13/2022 CLINICAL DATA:  Dyspnea, respiratory illness. History of prostate cancer. * Tracking Code: BO * EXAM: CT CHEST WITHOUT CONTRAST TECHNIQUE: Multidetector CT imaging of the chest was performed following the standard protocol without IV contrast. RADIATION DOSE REDUCTION: This exam was performed according  to the departmental dose-optimization program which includes automated exposure control, adjustment of the mA and/or kV according to patient size and/or use of iterative reconstruction technique. COMPARISON:  CT chest August 08, 2022. FINDINGS: Cardiovascular: Aortic atherosclerosis. Coronary artery calcifications. Normal size heart. No significant pericardial effusion/thickening. Mediastinum/Nodes: No suspicious thyroid nodule. No pathologically enlarged mediastinal, hilar or axillary lymph nodes, noting limited sensitivity for the detection of hilar adenopathy on this noncontrast study. The esophagus is grossly unremarkable. Lungs/Pleura: Right upper lobe masslike consolidation with multiple satellite nodular opacities within all lobes of the right lung as well as interstitial and interposed ground-glass opacities. Small bilateral pleural effusions. Upper Abdomen: Hepatic steatosis with morphologic changes suggestive of cirrhosis. Musculoskeletal: Multilevel degenerative changes spine. Degenerative change of the bilateral shoulders. IMPRESSION: 1. Right upper lobe masslike consolidation with multiple satellite nodular opacities within all lobes of the right lung as well as interstitial and interposed ground-glass opacities. Findings are favored to reflect multifocal pneumonia. However, recommend short-term interval follow-up CT in 6-8 weeks after treatment to ensure resolution and exclude underlying neoplasm. 2. Small bilateral pleural effusions. 3. Hepatic steatosis with morphologic changes suggestive of cirrhosis. 4.  Aortic Atherosclerosis (ICD10-I70.0). Electronically Signed   By: Dahlia Bailiff M.D.   On: 10/13/2022 12:40   DG CHEST PORT 1 VIEW  Result Date: 10/12/2022 CLINICAL DATA:  Acute respiratory EXAM: PORTABLE CHEST 1 VIEW COMPARISON:  10/11/2022 FINDINGS: Cardiac shadow is enlarged but stable. Lungs are well aerated bilaterally. Mild atelectatic changes are noted in the right  base. Left-sided  effusion is less well visualized on today's exam. Some increasing left basilar opacity is noted. No bony abnormality is seen. Postsurgical changes in the cervical spine are noted. IMPRESSION: Increasing bibasilar opacities left greater than right. Electronically Signed   By: Inez Catalina M.D.   On: 10/12/2022 21:44     Impression: 72 year old gentleman with GERD, history of prostate cancer, O2 dependent COPD, OSA admitted with profound dyspnea on exertion.  Patient was found to be significantly anemic with a hemoglobin of 6.5 requiring 2 units of packed RBCs.  Hemoccult negative and, clinically, no GI bleeding.  Well-documented iron deficiency anemia  Similar episode in October when he was admitted and transfused. Respiratory status appears clinically much improved.  Last colonoscopy 2019-Dr. Arnoldo Morale.  Multiple adenomas removed.  Longstanding GERD controlled with PPI.-No prior EGD.  Recommendations: I have offered the patient both a diagnostic EGD and colonoscopy tomorrow pending reassessment of his respiratory status.  I discussed this approach with the patient along with potential, risk, benefits, limitations and alternatives.  His questions were answered.  He is eager to proceed.  I spoke with Dr. Carles Collet about his current respiratory status.  He felt patient was somewhat fluid overloaded and he is going to provide diuretic therapy today to more optimize him for tomorrow's procedure  Furthermore, patient understands Dr. Abbey Chatters will be performing the procedures in my absence.  Patient is agreeable.

## 2022-10-14 NOTE — Care Management Important Message (Signed)
Important Message  Patient Details  Name: Mark Cannon MRN: [id number] Date of Birth: 1951-01-22   Medicare Important Message Given:  N/A - LOS <3 / Initial given by admissions     Dannette Barbara 10/14/2022, 12:08 PM

## 2022-10-15 ENCOUNTER — Inpatient Hospital Stay (HOSPITAL_COMMUNITY): Payer: Medicare Other | Admitting: Anesthesiology

## 2022-10-15 ENCOUNTER — Telehealth: Payer: Self-pay | Admitting: Gastroenterology

## 2022-10-15 ENCOUNTER — Encounter (HOSPITAL_COMMUNITY)
Admission: EM | Disposition: A | Discharge status: Home / Self Care | Payer: Self-pay | Source: Home / Self Care | Attending: Internal Medicine

## 2022-10-15 ENCOUNTER — Encounter (HOSPITAL_COMMUNITY): Payer: Self-pay | Admitting: Internal Medicine

## 2022-10-15 ENCOUNTER — Other Ambulatory Visit: Payer: Self-pay | Admitting: *Deleted

## 2022-10-15 DIAGNOSIS — K219 Gastro-esophageal reflux disease without esophagitis: Secondary | ICD-10-CM

## 2022-10-15 DIAGNOSIS — D124 Benign neoplasm of descending colon: Secondary | ICD-10-CM

## 2022-10-15 DIAGNOSIS — D649 Anemia, unspecified: Secondary | ICD-10-CM | POA: Diagnosis not present

## 2022-10-15 DIAGNOSIS — E669 Obesity, unspecified: Secondary | ICD-10-CM | POA: Diagnosis not present

## 2022-10-15 DIAGNOSIS — K5730 Diverticulosis of large intestine without perforation or abscess without bleeding: Secondary | ICD-10-CM | POA: Diagnosis not present

## 2022-10-15 DIAGNOSIS — D509 Iron deficiency anemia, unspecified: Secondary | ICD-10-CM | POA: Diagnosis not present

## 2022-10-15 DIAGNOSIS — D123 Benign neoplasm of transverse colon: Secondary | ICD-10-CM

## 2022-10-15 DIAGNOSIS — K635 Polyp of colon: Secondary | ICD-10-CM | POA: Diagnosis not present

## 2022-10-15 DIAGNOSIS — I5031 Acute diastolic (congestive) heart failure: Secondary | ICD-10-CM | POA: Diagnosis not present

## 2022-10-15 DIAGNOSIS — D120 Benign neoplasm of cecum: Secondary | ICD-10-CM

## 2022-10-15 HISTORY — PX: ESOPHAGOGASTRODUODENOSCOPY (EGD) WITH PROPOFOL: SHX5813

## 2022-10-15 HISTORY — PX: COLONOSCOPY WITH PROPOFOL: SHX5780

## 2022-10-15 HISTORY — PX: POLYPECTOMY: SHX5525

## 2022-10-15 LAB — CBC
HCT: 29.3 % — ABNORMAL LOW (ref 39.0–52.0)
Hemoglobin: 8.4 g/dL — ABNORMAL LOW (ref 13.0–17.0)
MCH: 24.3 pg — ABNORMAL LOW (ref 26.0–34.0)
MCHC: 28.7 g/dL — ABNORMAL LOW (ref 30.0–36.0)
MCV: 84.7 fL (ref 80.0–100.0)
Platelets: 295 10*3/uL (ref 150–400)
RBC: 3.46 MIL/uL — ABNORMAL LOW (ref 4.22–5.81)
RDW: 16.8 % — ABNORMAL HIGH (ref 11.5–15.5)
WBC: 8.8 10*3/uL (ref 4.0–10.5)
nRBC: 0 % (ref 0.0–0.2)

## 2022-10-15 LAB — HEMOGLOBIN A1C
Hgb A1c MFr Bld: 7.2 % — ABNORMAL HIGH (ref 4.8–5.6)
Mean Plasma Glucose: 160 mg/dL

## 2022-10-15 LAB — BASIC METABOLIC PANEL
Anion gap: 10 (ref 5–15)
BUN: 11 mg/dL (ref 8–23)
CO2: 35 mmol/L — ABNORMAL HIGH (ref 22–32)
Calcium: 8.8 mg/dL — ABNORMAL LOW (ref 8.9–10.3)
Chloride: 94 mmol/L — ABNORMAL LOW (ref 98–111)
Creatinine, Ser: 1.14 mg/dL (ref 0.61–1.24)
GFR, Estimated: >60 mL/min (ref >60)
Glucose, Bld: 127 mg/dL — ABNORMAL HIGH (ref 70–99)
Potassium: 3.4 mmol/L — ABNORMAL LOW (ref 3.5–5.1)
Sodium: 139 mmol/L (ref 135–145)

## 2022-10-15 LAB — GLUCOSE, CAPILLARY
Glucose-Capillary: 163 mg/dL — ABNORMAL HIGH (ref 70–99)
Glucose-Capillary: 128 mg/dL — ABNORMAL HIGH (ref 70–99)
Glucose-Capillary: 117 mg/dL — ABNORMAL HIGH (ref 70–99)

## 2022-10-15 LAB — MAGNESIUM: Magnesium: 1.9 mg/dL (ref 1.7–2.4)

## 2022-10-15 SURGERY — ESOPHAGOGASTRODUODENOSCOPY (EGD) WITH PROPOFOL
Anesthesia: General

## 2022-10-15 MED ORDER — AMLODIPINE BESYLATE 5 MG PO TABS: 5.0000 mg | ORAL_TABLET | Freq: Every day | ORAL | 1 refills | Status: DC

## 2022-10-15 MED ORDER — PROPOFOL 10 MG/ML IV BOLUS
INTRAVENOUS | Status: DC | PRN
  Administered 2022-10-15: 20 mg via INTRAVENOUS
  Administered 2022-10-15: 40 mg via INTRAVENOUS
  Administered 2022-10-15: 10 mg via INTRAVENOUS
  Administered 2022-10-15 (×2): 50 mg via INTRAVENOUS
  Administered 2022-10-15: 40 mg via INTRAVENOUS
  Administered 2022-10-15: 50 mg via INTRAVENOUS
  Administered 2022-10-15 (×2): 20 mg via INTRAVENOUS
  Administered 2022-10-15: 50 mg via INTRAVENOUS
  Administered 2022-10-15 (×3): 20 mg via INTRAVENOUS

## 2022-10-15 MED ORDER — CEFDINIR 300 MG PO CAPS: 300.0000 mg | ORAL_CAPSULE | Freq: Two times a day (BID) | ORAL | 0 refills | Status: DC

## 2022-10-15 MED ORDER — FERROUS SULFATE 325 (65 FE) MG PO TABS: 325.0000 mg | ORAL_TABLET | Freq: Every day | ORAL | Status: DC

## 2022-10-15 MED ORDER — CEFDINIR 300 MG PO CAPS: 300.0000 mg | ORAL_CAPSULE | Freq: Two times a day (BID) | ORAL | Status: DC

## 2022-10-15 MED ORDER — OXYMETAZOLINE HCL 0.05 % NA SOLN
1.0000 | Freq: Two times a day (BID) | NASAL | Status: DC
  Filled 2022-10-15: qty 15

## 2022-10-15 MED ORDER — LACTATED RINGERS IV SOLN: INTRAVENOUS | Status: DC | PRN

## 2022-10-15 MED ORDER — SODIUM CHLORIDE 0.9 % IV SOLN: INTRAVENOUS | Status: DC

## 2022-10-15 MED ORDER — STERILE WATER FOR IRRIGATION IR SOLN
Status: DC | PRN
  Administered 2022-10-15: 120 mL

## 2022-10-15 MED ORDER — FUROSEMIDE 40 MG PO TABS: 40.0000 mg | ORAL_TABLET | Freq: Every day | ORAL | 1 refills | Status: DC

## 2022-10-15 MED ORDER — AZITHROMYCIN 250 MG PO TABS: 500.0000 mg | ORAL_TABLET | Freq: Every day | ORAL | Status: DC

## 2022-10-15 MED ORDER — FUROSEMIDE 40 MG PO TABS: 40.0000 mg | ORAL_TABLET | Freq: Every day | ORAL | Status: DC

## 2022-10-15 MED ORDER — FERROUS SULFATE 325 (65 FE) MG PO TABS: 325.0000 mg | ORAL_TABLET | Freq: Every day | ORAL | 3 refills | Status: DC

## 2022-10-15 MED ORDER — AZITHROMYCIN 500 MG PO TABS: 500.0000 mg | ORAL_TABLET | Freq: Every day | ORAL | 0 refills | Status: DC

## 2022-10-15 MED ORDER — POTASSIUM CHLORIDE 10 MEQ/100ML IV SOLN
10.0000 meq | Freq: Once | INTRAVENOUS | Status: AC
  Administered 2022-10-15: 10 meq via INTRAVENOUS
  Filled 2022-10-15: qty 100

## 2022-10-15 NOTE — Telephone Encounter (Signed)
Please make patient a hospital follow up with [facility] in 3-4 weeks. If no availability can be with any APP.  Marua Qin S - CBC in 1 week (Dx: anemia)  Venetia Night, MSN, APRN, FNP-BC, AGACNP-BC [facility] at [facility]

## 2022-10-15 NOTE — Progress Notes (Signed)
Patient stable and ready for discharge home. Patient dressed hisself and packed his belongings. Writer removed IV and tele. Writer went over discharge paperwork with patient and he verbalized understanding. Writer transported patient via [place] to wife's car to go home.

## 2022-10-15 NOTE — Op Note (Signed)
[facility] Patient Name: Mark Cannon Procedure Date: 10/15/2022 10:35 AM MRN: [id number] Date of Birth: 1951/09/01 Attending MD: Elon Alas. Edgar Frisk, [id number] CSN: [id number] Age: 72 Admit Type: Inpatient Procedure:                Upper GI endoscopy Indications:              Iron deficiency anemia, Gastro-esophageal reflux                            disease Providers:                Elon Alas. Abbey Chatters, DO, Tammy Vaught, RN, Caprice Kluver, [place] Gene Technician, Technician Referring MD:              Medicines:                See the Anesthesia note for documentation of the                            administered medications Complications:            No immediate complications. Estimated Blood Loss:     Estimated blood loss: none. Procedure:                Pre-Anesthesia Assessment:                           - The anesthesia plan was to use monitored                            anesthesia care (MAC).                           After obtaining informed consent, the endoscope was                            passed under direct vision. Throughout the                            procedure, the patient's blood pressure, pulse, and                            oxygen saturations were monitored continuously. The                            [id number]-H190 ([id number]) scope was introduced through the                            mouth, and advanced to the second part of duodenum.                            The upper GI endoscopy was accomplished without                            difficulty. The patient tolerated the  procedure                            well. Scope In: 10:51:17 AM Scope Out: 10:59:10 AM Total Procedure Duration: 0 hours 7 minutes 53 seconds  Findings:      The Z-line was regular and was found 38 cm from the incisors.      There is no endoscopic evidence of bleeding, areas of erosion,       esophagitis, ulcerations or varices in the entire esophagus.       The entire examined stomach was normal.      The duodenal bulb, first portion of the duodenum and second portion of       the duodenum were normal. Impression:               - Z-line regular, 38 cm from the incisors.                           - Normal stomach.                           - Normal duodenal bulb, first portion of the                            duodenum and second portion of the duodenum.                           - No specimens collected. Moderate Sedation:      Per Anesthesia Care Recommendation:           - Use a proton pump inhibitor PO daily.                           - Proceed with colonoscopy Procedure Code(s):        --- Professional ---                           [phone], Esophagogastroduodenoscopy, flexible,                            transoral; diagnostic, including collection of                            specimen(s) by brushing or washing, when performed                            (separate procedure) Diagnosis Code(s):        --- Professional ---                           D50.9, Iron deficiency anemia, unspecified                           K21.9, Gastro-esophageal reflux disease without                            esophagitis CPT copyright 2022 [employer] Association. All rights reserved. The codes documented in this report are preliminary and upon coder  review may  be revised to meet current compliance requirements. Elon Alas. Abbey Chatters, DO [place] Abbey Chatters, DO 10/15/2022 11:02:34 AM This report has been signed electronically. Number of Addenda: 0

## 2022-10-15 NOTE — Interval H&P Note (Signed)
History and Physical Interval Note:  10/15/2022 10:32 AM  Mark Cannon  has presented today for surgery, with the diagnosis of GERD, iron deficiency anemia and history of colon polyps.  The various methods of treatment have been discussed with the patient and family. After consideration of risks, benefits and other options for treatment, the patient has consented to  Procedure(s): ESOPHAGOGASTRODUODENOSCOPY (EGD) WITH PROPOFOL (N/A) COLONOSCOPY WITH PROPOFOL (N/A) as a surgical intervention.  The patient's history has been reviewed, patient examined, no change in status, stable for surgery.  I have reviewed the patient's chart and labs.  Questions were answered to the patient's satisfaction.     Eloise Harman

## 2022-10-15 NOTE — Anesthesia Preprocedure Evaluation (Signed)
Anesthesia Evaluation  Patient identified by MRN, date of birth, ID band Patient awake    Airway Mallampati: II  TM Distance: >3 FB Neck ROM: Full    Dental no notable dental hx.    Pulmonary sleep apnea , pneumonia, COPD, former smoker Resent Acute on chronic respiratory failure with hypoxia   Pulmonary exam normal breath sounds clear to auscultation       Cardiovascular hypertension, +CHF and + DOE  Normal cardiovascular exam Rhythm:Regular Rate:Normal     Neuro/Psych CVA    GI/Hepatic ,GERD  ,,  Endo/Other  diabetes, Poorly Controlled, Type 2  Morbid obesity  Renal/GU Renal disease     Musculoskeletal  (+) Arthritis ,    Abdominal   Peds  Hematology  (+) Blood dyscrasia, anemia Hb after PRBC X 2 = 8.4   Anesthesia Other Findings H/O Prostate Ca  Reproductive/Obstetrics                              Anesthesia Physical Anesthesia Plan  ASA: 4  Anesthesia Plan: General   Post-op Pain Management:    Induction: Intravenous  PONV Risk Score and Plan:   Airway Management Planned: Nasal Cannula and Natural Airway  Additional Equipment:   Intra-op Plan:   Post-operative Plan:   Informed Consent: I have reviewed the patients History and Physical, chart, labs and discussed the procedure including the risks, benefits and alternatives for the proposed anesthesia with the patient or authorized representative who has indicated his/her understanding and acceptance.     Dental advisory given  Plan Discussed with: CRNA  Anesthesia Plan Comments:          Anesthesia Quick Evaluation

## 2022-10-15 NOTE — Progress Notes (Signed)
Patient seen and evaluated prior to undergoing procedure today. He is currently on 4L [place] with oxygen saturations above 93% and without any significant shortness of breath. He is clear but diminished in the bases on exam. Reports clear liquid diet for the last 4 days and stools have been clear overnight with prep. No reports of melena or BRBPR. He is stable to undergo EGD and TCS today with Dr. Abbey Chatters. Will receive 1 dose of potassium for K 3.4.   Venetia Night, MSN, APRN, FNP-BC, AGACNP-BC [facility] at [facility]

## 2022-10-15 NOTE — Telephone Encounter (Signed)
Spoke to pt, informed him to have labs completed next week. Labs entered into Epic. Pt would like results sent to PCP.

## 2022-10-15 NOTE — Discharge Summary (Signed)
Physician Discharge Summary   Patient: Mark Cannon MRN: [id number] DOB: [birth year]  Admit date:     10/11/2022  Discharge date: 10/15/22  Discharge Physician: Shanon Brow Edyn Qazi   PCP: Jake Samples, PA-C   Recommendations at discharge:   Please follow up with primary care provider within 1-2 weeks  Please repeat BMP and CBC in one week    Hospital Course: 72 year old male with a history of hypertension, hyperlipidemia, prostate cancer status post prostatectomy, OSA, CKD stage III, chronic respiratory failure on 4 L, tobacco abuse in remission presenting with dyspnea on exertion over the past 2 to 3 days prior to admission.  The patient states that he had a similar presentation back in October 2023 when he was admitted for symptomatic anemia.  He was transfused with 1 unit PRBC at that time and discharged home in stable condition.  GI workup was deferred to the outpatient setting.  He subsequently followed up with [facility] in the office on 08/12/2022.  Colonoscopy was planned given his history of iron deficiency anemia.  However this was delayed due to his poorly controlled sugars. He presents again with generalized weakness and dyspnea on exertion.  He denies any hematochezia, melena, hematemesis, hematuria.  He has not had any fevers, chills, chest pain, nausea, vomiting, diarrhea, abdominal pain.  He denies any NSAIDs.  He denies any worsening lower extremity edema. In the ED, the patient was afebrile and hemodynamically stable with oxygen saturation 94-96% on 4 L.  WBC 9.7, hemoglobin 6.3, platelets 299,000.  Sodium 137, potassium 4.4, bicarbonate 35, serum creatinine 1.18. FOBT was neg in ED.  GI was consulted to assist with management.  He was transfused with 2 units PRBC  During the evening 12/30-12/31, patient developed worsening sob and oxygen desaturation with increased oxygen demand up to 9L.   COVID, RSV, Flu were ordered, but CXR and hx/exam suggest fluid overload.  He was  started on IV lasix with clinical improvement.  He was weaned down to 4L [place].  He will d/c home with po lasix  Assessment and Plan: Acute on chronic respiratory failure with hypoxia -due to fluid overload -PCT 0.15 -Check COVID, RSV, Flu--all neg -CT chest--Right upper lobe masslike consolidation with multiple satellite nodular opacities within all lobes of the right lung as well as interstitial and interposed ground-glass opacities -on 4L at baseline -now on 9L HFNC>>8L>>5L>>4L   Symptomatic anemia -Patient has chronic blood loss -Hemoglobin 8.9 at the time of his discharge on 08/09/2022 -ferritin 8, iron sat 23% -Colonoscopy was planned in the outpatient setting, but delayed secondary to poorly controlled diabetes -GI consult appreciated -Transfused 2 units PRBC>>Hgb remained stable thereafter -pantoprazole IV bid>>d/c home with protonix 40 mg qday -10/15/22 EGD--normal -10/15/22 colonoscopy--8 polyps removed. +diverticulosis -Hgb 8.4 on day of dc   Acute HFpEF -started on IV lasix -05/15/22 Echo--EF 60-65%, G1DD, no WMA, normal RVF -10/14/22 ReDS = 36 -d/c home with lasix 40 mg po daily   CKD stage IIIa -Baseline creatinine 1.1-1.4 -monitor with diuresis -serum creatinine 1.14 on day of d/c   Pulmonary nodular infiltrates -12/31 CT chest--RUL mass like consolidations -clinically not consistent with PNA -give empiric course abx>>d/c home with cefdinir and azithro x 5 more days -repeat CT chest 2 months -PCT 0.15   Uncontrolled diabetes mellitus type 2 with hyperglycemia -Holding Amaryl--can restart after dc -05/14/2022 A1c 7.7 -NovoLog sliding scale -12/30 Repeat A1c--7.3   Iron deficiency Anemia -iron sat 23 -ferritin 8 -d/c home with ferrous sulfate  Essential hypertension -restarted amlodipine 5 mg daily   COPD -stable -he is not on any maintenance medications at home -Quit smoking in September 2023 after 50 pack years   History of stroke -Intolerant to  statin -Holding aspirin temporarily>>restart after d/c   GERD -Continue pantoprazole   Hyperlipidemia -Intolerant to statin   Class II obesity -BMI 36.49 -Lifestyle modification   OSA Not on CPAP secondary to intolerance.      Consultants: GI Procedures performed: none  Disposition: Home Diet recommendation:  Carb modified diet DISCHARGE MEDICATION: Allergies as of 10/15/2022       Reactions   Lidocaine Hives   Benzocaine Other (See Comments)   Blisters    Other    Novocaine - blisters in mouth        Medication List     TAKE these medications    amLODipine 5 MG tablet Commonly known as: NORVASC Take 1 tablet (5 mg total) by mouth daily. Start taking on: October 16, 2022 What changed:  medication strength how much to take   aspirin EC 81 MG tablet Take 1 tablet (81 mg total) by mouth daily. Swallow whole.   azithromycin 500 MG tablet Commonly known as: ZITHROMAX Take 1 tablet (500 mg total) by mouth daily.   cefdinir 300 MG capsule Commonly known as: OMNICEF Take 1 capsule (300 mg total) by mouth every 12 (twelve) hours.   ferrous sulfate 325 (65 FE) MG tablet Take 1 tablet (325 mg total) by mouth daily with breakfast. Start taking on: October 16, 2022   furosemide 40 MG tablet Commonly known as: LASIX Take 1 tablet (40 mg total) by mouth daily.   glimepiride 1 MG tablet Commonly known as: AMARYL Take 1 mg by mouth 2 (two) times daily.   ipratropium-albuterol 0.5-2.5 (3) MG/3ML Soln Commonly known as: DUONEB Take 3 mLs by nebulization every 6 (six) hours as needed (SOB/wheezing.).   OXYGEN Inhale 4 L into the lungs continuous.   pantoprazole 40 MG tablet Commonly known as: PROTONIX Take 1 tablet (40 mg total) by mouth daily.   PRESCRIPTION MEDICATION Hormone therapy injection every 6 months through [facility] due to prostate cancer.        Discharge Exam: Filed Weights   10/11/22 2202 10/12/22 1615 10/15/22 1006  Weight:  108.9 kg 108.2 kg 108.2 kg   HEENT:  [place]/AT, No thrush, no icterus CV:  RRR, no rub, no S3, no S4 Lung:  fine bibasilar rales. No wheeze Abd:  soft/+BS, NT Ext:  No edema, no lymphangitis, no synovitis, no rash   Condition at discharge: stable  The results of significant diagnostics from this hospitalization (including imaging, microbiology, ancillary and laboratory) are listed below for reference.   Imaging Studies: CT CHEST WO CONTRAST  Result Date: 10/13/2022 CLINICAL DATA:  Dyspnea, respiratory illness. History of prostate cancer. * Tracking Code: BO * EXAM: CT CHEST WITHOUT CONTRAST TECHNIQUE: Multidetector CT imaging of the chest was performed following the standard protocol without IV contrast. RADIATION DOSE REDUCTION: This exam was performed according to the departmental dose-optimization program which includes automated exposure control, adjustment of the mA and/or kV according to patient size and/or use of iterative reconstruction technique. COMPARISON:  CT chest August 08, 2022. FINDINGS: Cardiovascular: Aortic atherosclerosis. Coronary artery calcifications. Normal size heart. No significant pericardial effusion/thickening. Mediastinum/Nodes: No suspicious thyroid nodule. No pathologically enlarged mediastinal, hilar or axillary lymph nodes, noting limited sensitivity for the detection of hilar adenopathy on this noncontrast study. The esophagus is grossly unremarkable.  Lungs/Pleura: Right upper lobe masslike consolidation with multiple satellite nodular opacities within all lobes of the right lung as well as interstitial and interposed ground-glass opacities. Small bilateral pleural effusions. Upper Abdomen: Hepatic steatosis with morphologic changes suggestive of cirrhosis. Musculoskeletal: Multilevel degenerative changes spine. Degenerative change of the bilateral shoulders. IMPRESSION: 1. Right upper lobe masslike consolidation with multiple satellite nodular opacities within all  lobes of the right lung as well as interstitial and interposed ground-glass opacities. Findings are favored to reflect multifocal pneumonia. However, recommend short-term interval follow-up CT in 6-8 weeks after treatment to ensure resolution and exclude underlying neoplasm. 2. Small bilateral pleural effusions. 3. Hepatic steatosis with morphologic changes suggestive of cirrhosis. 4.  Aortic Atherosclerosis (ICD10-I70.0). Electronically Signed   By: Dahlia Bailiff M.D.   On: 10/13/2022 12:40   DG CHEST PORT 1 VIEW  Result Date: 10/12/2022 CLINICAL DATA:  Acute respiratory EXAM: PORTABLE CHEST 1 VIEW COMPARISON:  10/11/2022 FINDINGS: Cardiac shadow is enlarged but stable. Lungs are well aerated bilaterally. Mild atelectatic changes are noted in the right base. Left-sided effusion is less well visualized on today's exam. Some increasing left basilar opacity is noted. No bony abnormality is seen. Postsurgical changes in the cervical spine are noted. IMPRESSION: Increasing bibasilar opacities left greater than right. Electronically Signed   By: Inez Catalina M.D.   On: 10/12/2022 21:44   DG Chest 2 View  Result Date: 10/11/2022 CLINICAL DATA:  Shortness of breath EXAM: CHEST - 2 VIEW COMPARISON:  08/08/2022 FINDINGS: Cardiac shadow is stable. Blunting of the left costophrenic angle is noted consistent with small pleural effusion. Mild opacity is noted in the left mid lung stable from prior CT. No focal infiltrate is seen. Postsurgical changes in the cervical spine are noted. IMPRESSION: Stable opacity left mid lung likely related to scarring. New small left effusion is seen. Electronically Signed   By: Inez Catalina M.D.   On: 10/11/2022 22:52    Microbiology: Results for orders placed or performed during the hospital encounter of 10/11/22  Resp panel by RT-PCR (RSV, Flu A&B, Covid) Anterior Nasal Swab     Status: None   Collection Time: 10/13/22  9:59 AM   Specimen: Anterior Nasal Swab  Result Value Ref  Range Status   SARS Coronavirus 2 by RT PCR NEGATIVE NEGATIVE Final    Comment: (NOTE) SARS-CoV-2 target nucleic acids are NOT DETECTED.  The SARS-CoV-2 RNA is generally detectable in upper respiratory specimens during the acute phase of infection. The lowest concentration of SARS-CoV-2 viral copies this assay can detect is 138 copies/mL. A negative result does not preclude SARS-Cov-2 infection and should not be used as the sole basis for treatment or other patient management decisions. A negative result may occur with  improper specimen collection/handling, submission of specimen other than nasopharyngeal swab, presence of viral mutation(s) within the areas targeted by this assay, and inadequate number of viral copies(<138 copies/mL). A negative result must be combined with clinical observations, patient history, and epidemiological information. The expected result is Negative.  Fact Sheet for Patients:  EntrepreneurPulse.com.au  Fact Sheet for Healthcare Providers:  IncredibleEmployment.be  This test is no t yet approved or cleared by the [place] FDA and  has been authorized for detection and/or diagnosis of SARS-CoV-2 by FDA under an Emergency Use Authorization (EUA). This EUA will remain  in effect (meaning this test can be used) for the duration of the COVID-19 declaration under Section 564(b)(1) of the Act, 21 U.S.C.section 360bbb-3(b)(1), unless the authorization is  terminated  or revoked sooner.       Influenza A by PCR NEGATIVE NEGATIVE Final   Influenza B by PCR NEGATIVE NEGATIVE Final    Comment: (NOTE) The Xpert Xpress SARS-CoV-2/FLU/RSV plus assay is intended as an aid in the diagnosis of influenza from Nasopharyngeal swab specimens and should not be used as a sole basis for treatment. Nasal washings and aspirates are unacceptable for Xpert Xpress SARS-CoV-2/FLU/RSV testing.  Fact Sheet for  Patients: EntrepreneurPulse.com.au  Fact Sheet for Healthcare Providers: IncredibleEmployment.be  This test is not yet approved or cleared by the [place] FDA and has been authorized for detection and/or diagnosis of SARS-CoV-2 by FDA under an Emergency Use Authorization (EUA). This EUA will remain in effect (meaning this test can be used) for the duration of the COVID-19 declaration under Section 564(b)(1) of the Act, 21 U.S.C. section 360bbb-3(b)(1), unless the authorization is terminated or revoked.     Resp Syncytial Virus by PCR NEGATIVE NEGATIVE Final    Comment: (NOTE) Fact Sheet for Patients: EntrepreneurPulse.com.au  Fact Sheet for Healthcare Providers: IncredibleEmployment.be  This test is not yet approved or cleared by the [place] FDA and has been authorized for detection and/or diagnosis of SARS-CoV-2 by FDA under an Emergency Use Authorization (EUA). This EUA will remain in effect (meaning this test can be used) for the duration of the COVID-19 declaration under Section 564(b)(1) of the Act, 21 U.S.C. section 360bbb-3(b)(1), unless the authorization is terminated or revoked.  Performed at [facility], [address], [place], [place] [address]   Respiratory (~20 pathogens) panel by PCR     Status: None   Collection Time: 10/13/22 10:02 AM  Result Value Ref Range Status   Adenovirus NOT DETECTED NOT DETECTED Final   Coronavirus 229E NOT DETECTED NOT DETECTED Final    Comment: (NOTE) The Coronavirus on the Respiratory Panel, DOES NOT test for the novel  Coronavirus (2019 nCoV)    Coronavirus HKU1 NOT DETECTED NOT DETECTED Final   Coronavirus NL63 NOT DETECTED NOT DETECTED Final   Coronavirus OC43 NOT DETECTED NOT DETECTED Final   Metapneumovirus NOT DETECTED NOT DETECTED Final   Rhinovirus / Enterovirus NOT DETECTED NOT DETECTED Final   Influenza A NOT DETECTED NOT DETECTED Final    Influenza B NOT DETECTED NOT DETECTED Final   Parainfluenza Virus 1 NOT DETECTED NOT DETECTED Final   Parainfluenza Virus 2 NOT DETECTED NOT DETECTED Final   Parainfluenza Virus 3 NOT DETECTED NOT DETECTED Final   Parainfluenza Virus 4 NOT DETECTED NOT DETECTED Final   Respiratory Syncytial Virus NOT DETECTED NOT DETECTED Final   Bordetella pertussis NOT DETECTED NOT DETECTED Final   Bordetella Parapertussis NOT DETECTED NOT DETECTED Final   Chlamydophila pneumoniae NOT DETECTED NOT DETECTED Final   Mycoplasma pneumoniae NOT DETECTED NOT DETECTED Final    Comment: Performed at [facility], [place] [address], [place], [place] [address]    Labs: CBC: Recent Labs  Lab 10/11/22 2223 10/12/22 1014 10/13/22 0528 10/14/22 0657 10/15/22 0433  WBC 9.7 10.4 11.7* 9.6 8.8  HGB 6.3* 8.5* 9.1* 8.2* 8.4*  HCT 22.7* 28.8* 31.1* 28.4* 29.3*  MCV 82.2 83.7 84.7 84.0 84.7  PLT 299 280 346 285 622   Basic Metabolic Panel: Recent Labs  Lab 10/11/22 2223 10/13/22 0528 10/14/22 0657 10/15/22 0433  NA 137 142 140 139  K 4.4 4.2 3.6 3.4*  CL 94* 97* 95* 94*  CO2 35* 37* 38* 35*  GLUCOSE 211* 179* 149* 127*  BUN '16 11 10 11  '$ CREATININE 1.18 0.97 1.11 1.14  CALCIUM 8.7* 9.0 8.6* 8.8*  MG  --   --  2.0 1.9  PHOS  --   --  3.1  --    Liver Function Tests: No results for input(s): "AST", "ALT", "ALKPHOS", "BILITOT", "PROT", "ALBUMIN" in the last 168 hours. CBG: Recent Labs  Lab 10/14/22 1223 10/14/22 1617 10/14/22 2100 10/15/22 0746 10/15/22 1142  GLUCAP 163* 127* 124* 128* 117*    Discharge time spent: greater than 30 minutes.  Signed: Orson Eva, MD [facility] 10/15/2022

## 2022-10-15 NOTE — Transfer of Care (Signed)
Immediate Anesthesia Transfer of Care Note  Patient: SHENOUDA GENOVA  Procedure(s) Performed: ESOPHAGOGASTRODUODENOSCOPY (EGD) WITH PROPOFOL COLONOSCOPY WITH PROPOFOL POLYPECTOMY  Patient Location: PACU  Anesthesia Type:General  Level of Consciousness: awake  Airway & Oxygen Therapy: Patient Spontanous Breathing  Post-op Assessment: Report given to RN  Post vital signs: Reviewed and stable  Last Vitals:  Vitals Value Taken Time  BP    Temp    Pulse 116 10/15/22 1139  Resp 17 10/15/22 1141  SpO2 100 % 10/15/22 1139  Vitals shown include unvalidated device data.  Last Pain:  Vitals:   10/15/22 1045  TempSrc:   PainSc: 0-No pain         Complications: No notable events documented.

## 2022-10-15 NOTE — Op Note (Signed)
[facility] Patient Name: Mark Cannon Procedure Date: 10/15/2022 10:30 AM MRN: [id number] Date of Birth: 1950-12-26 Attending MD: Elon Alas. Edgar Frisk, [id number] CSN: [id number] Age: 72 Admit Type: Inpatient Procedure:                Colonoscopy Indications:              Iron deficiency anemia Providers:                Elon Alas. Abbey Chatters, DO, Caprice Kluver, Rosina Lowenstein,                            RN, Ladoris Gene Technician, Technician Referring MD:              Medicines:                See the Anesthesia note for documentation of the                            administered medications Complications:            No immediate complications. Estimated Blood Loss:     Estimated blood loss was minimal. Procedure:                Pre-Anesthesia Assessment:                           - The anesthesia plan was to use monitored                            anesthesia care (MAC).                           After obtaining informed consent, the colonoscope                            was passed under direct vision. Throughout the                            procedure, the patient's blood pressure, pulse, and                            oxygen saturations were monitored continuously. The                            [id number]-HQ190L ([id number]) scope was introduced through                            the anus and advanced to the the cecum, identified                            by appendiceal orifice and ileocecal valve. The                            colonoscopy was performed without difficulty. The                            patient tolerated the procedure  well. The quality                            of the bowel preparation was evaluated using the                            BBPS [facility] Bowel Preparation Scale) with scores                            of: Right Colon = 2 (minor amount of residual                            staining, small fragments of stool and/or opaque                            liquid, but  mucosa seen well), Transverse Colon = 2                            (minor amount of residual staining, small fragments                            of stool and/or opaque liquid, but mucosa seen                            well) and Left Colon = 2 (minor amount of residual                            staining, small fragments of stool and/or opaque                            liquid, but mucosa seen well). The total BBPS score                            equals 6. Fair. Scope In: 11:03:51 AM Scope Out: 11:30:46 AM Scope Withdrawal Time: 0 hours 21 minutes 28 seconds  Total Procedure Duration: 0 hours 26 minutes 55 seconds  Findings:      The perianal and digital rectal examinations were normal.      Many large-mouthed and small-mouthed diverticula were found in the       sigmoid colon and descending colon.      A 5 mm polyp was found in the cecum. The polyp was sessile. The polyp       was removed with a cold snare. Resection and retrieval were complete.      A 15 mm polyp was found in the ascending colon. The polyp was sessile.       The polyp was removed with a cold snare in piecemeal fashion. Resection       and retrieval were complete.      Five sessile polyps were found in the descending colon and transverse       colon. The polyps were 6 to 10 mm in size. These polyps were removed       with a cold snare. Resection and retrieval were complete.      A 12 mm polyp was found in the  sigmoid colon. The polyp was       pedunculated. The polyp was removed with a cold snare. Resection and       retrieval were complete. Impression:               - Diverticulosis in the sigmoid colon and in the                            descending colon.                           - One 5 mm polyp in the cecum, removed with a cold                            snare. Resected and retrieved.                           - One 15 mm polyp in the ascending colon, removed                            with a cold snare.  Resected and retrieved.                           - Five 6 to 10 mm polyps in the descending colon                            and in the transverse colon, removed with a cold                            snare. Resected and retrieved.                           - One 12 mm polyp in the sigmoid colon, removed                            with a cold snare. Resected and retrieved. Moderate Sedation:      Per Anesthesia Care Recommendation:           - Resume previous diet.                           - Continue present medications.                           - Await pathology results.                           - Repeat colonoscopy in 3 years for surveillance. Procedure Code(s):        --- Professional ---                           [phone], Colonoscopy, flexible; with removal of                            tumor(s), polyp(s), or other lesion(s) by snare  technique Diagnosis Code(s):        --- Professional ---                           D12.0, Benign neoplasm of cecum                           D12.2, Benign neoplasm of ascending colon                           D12.5, Benign neoplasm of sigmoid colon                           D12.4, Benign neoplasm of descending colon                           D12.3, Benign neoplasm of transverse colon (hepatic                            flexure or splenic flexure)                           D50.9, Iron deficiency anemia, unspecified                           K57.30, Diverticulosis of large intestine without                            perforation or abscess without bleeding CPT copyright 2022 American Medical Association. All rights reserved. The codes documented in this report are preliminary and upon coder review may  be revised to meet current compliance requirements. Elon Alas. Abbey Chatters, DO August Abbey Chatters, DO 10/15/2022 11:35:23 AM This report has been signed electronically. Number of Addenda: 0

## 2022-10-16 LAB — TYPE AND SCREEN
ABO/RH(D): A POS
Antibody Screen: NEGATIVE
Unit division: 0
Unit division: 0
Unit division: 0

## 2022-10-16 LAB — BPAM RBC
Blood Product Expiration Date: 202401172359
Blood Product Expiration Date: 202401172359
Blood Product Expiration Date: 202401182359
ISSUE DATE / TIME: 202312300412
ISSUE DATE / TIME: 202312300648
Unit Type and Rh: 6200
Unit Type and Rh: 6200
Unit Type and Rh: 6200

## 2022-10-16 LAB — SURGICAL PATHOLOGY

## 2022-10-16 NOTE — Anesthesia Postprocedure Evaluation (Signed)
Anesthesia Post Note  Patient: Mark Cannon  Procedure(s) Performed: ESOPHAGOGASTRODUODENOSCOPY (EGD) WITH PROPOFOL COLONOSCOPY WITH PROPOFOL POLYPECTOMY  Patient location during evaluation: PACU Anesthesia Type: General Level of consciousness: awake and alert Pain management: pain level controlled Vital Signs Assessment: post-procedure vital signs reviewed and stable Respiratory status: spontaneous breathing, nonlabored ventilation, respiratory function stable and patient connected to nasal cannula oxygen Cardiovascular status: blood pressure returned to baseline and stable Postop Assessment: no apparent nausea or vomiting Anesthetic complications: no   No notable events documented.   Last Vitals:  Vitals:   10/15/22 1139 10/15/22 1300  BP:  120/80  Pulse: (!) 116 97  Resp: 18   Temp: 36.7 C   SpO2: 100% 96%    Last Pain:  Vitals:   10/15/22 1142  TempSrc:   PainSc: 0-No pain                 Nicanor Alcon

## 2022-10-18 DIAGNOSIS — Z6836 Body mass index (BMI) 36.0-36.9, adult: Secondary | ICD-10-CM | POA: Diagnosis not present

## 2022-10-18 DIAGNOSIS — J449 Chronic obstructive pulmonary disease, unspecified: Secondary | ICD-10-CM | POA: Diagnosis not present

## 2022-10-18 DIAGNOSIS — E6609 Other obesity due to excess calories: Secondary | ICD-10-CM | POA: Diagnosis not present

## 2022-10-18 DIAGNOSIS — I959 Hypotension, unspecified: Secondary | ICD-10-CM | POA: Diagnosis not present

## 2022-10-18 DIAGNOSIS — D649 Anemia, unspecified: Secondary | ICD-10-CM | POA: Diagnosis not present

## 2022-10-22 ENCOUNTER — Encounter (HOSPITAL_COMMUNITY): Payer: Self-pay | Admitting: Internal Medicine

## 2022-10-24 ENCOUNTER — Ambulatory Visit: Payer: MEDICARE

## 2022-10-24 ENCOUNTER — Telehealth: Payer: MEDICARE

## 2022-10-24 DIAGNOSIS — J45909 Unspecified asthma, uncomplicated: Secondary | ICD-10-CM

## 2022-10-24 DIAGNOSIS — G4733 Obstructive sleep apnea (adult) (pediatric): Secondary | ICD-10-CM

## 2022-10-24 NOTE — Progress Notes
PULMONARY/SLEEP MEDICINE NOTE    PCP: Babs Sciara, MD      CC: OSA fu     DIAGNOSTIC SLEEP TEST:  In 2006    PULMONARY FUNCTION TEST(s):  N/A    CARDIODIAGNOSTIC EXAM(s):  N/A      INTERVAL HISTORY:  Maurice Ramirez is a 72 y.o. male with  moderate persistent asthma, intermittent episodes of allergic bronchopulmonary aspergillosis , h/o LLL nodule (bx in 2012 benign), OSA on PAP, and h/o afib s/p cardioversion for fu OSA     Interim history:  Has not had a drink for 6 years. Using PAP nightly and 1/4 of time wakes up and mask is off his face.   Had no episode of asthma exacerbation since last visit. Has not gone to hospital for >3 years.   Exercising daily, walks 45 min a day, [employer] daily    LCV: 7/23: Doing well. 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 Does the [employer], bands, dumbbells - 1.5-2 hours work out then walks and [employer]. Gets enough sleep. Stopped alcohol.    He still reports using his device every night, still wakes up with the mask off and does not recall why, but this is not every night, and on the whole, he denies fatigue, daytime sleepiness, snoring and morning headaches. Uses [employer] when he travels.  No asthma exacerbations during this time. Denies shortness of breath, wheezing, chest tightness and dizziness. Compliant on his inhalers. Able to run/jog every few days. Does Bruce yearly and does better every year.    11/01/21: Patient returns after 2 years away from clinic. He has been doing quite well. He still reports using his device every night, still wakes up with the mask off and does not recall why, but this is not every night, and on the whole, he denies fatigue, daytime sleepiness, snoring and morning headaches. Uses [employer] when he travels. He is exercising daily (brisk walk/run outside). He increases his exercise tolerance every year. No asthma exacerbations during this time. Denies shortness of breath, wheezing, chest tightness and dizziness. Compliant on his inhalers.      Asthma meds and hx:  Singulair 10mg  daily, advair 500/50 BID and albuterol (1x/week)  Last hospital visit for asthma was many years ago, intubated about >40yrs ago for a severe attack  Last use of prednisone dose for asthma (prior to sinus infections) was several years ago    Current bedtime 10 PM, falls asleep within few min  with PAP, wakes up at 5 am  Patient is tolerating PAP therapy: Yes  On PAP therapy, patient reports improvement in: sleep quality, daytime energy, alertness, and No improvement  Abdominal bloating, nausea or eructation associated with PAP therapy: No  Snoring with PAP: No  Sleepiness with driving or motor vehicle collisions due to drowsy driving: No  The patient is using a standard nasal interface.  Patient is routinely getting replacement supplies: Yes     The patient is willing to continue with PAP therapy     PMH: asthma    Social History     Tobacco Use    Smoking status: Never    Smokeless tobacco: Never       No family history on file.    Allergies   Allergen Reactions    Chocolate     Pollen Extract     Wheat Bran        No outpatient medications have been marked as taking  for the 10/24/22 encounter (Appointment) with Lazaro Arms., MD.       ROS: pertinent review of systems as detailed in the HPI. The rest of the complete review of systems is negative except as detailed in the written health and history questionnaire.      PHYSICAL EXAM:  There were no vitals taken for this visit.  General: Well-developed male, in no acute distress.   HEENT: Normocephalic and atraumatic. Pupils are equal, round,  Mallampati class III airway. No exudates or lesions.   Neck: Supple.   Respiratory/Chest: Respirations non-labored. No accessory muscle use. clear to auscultation bilaterally. No crackles. No wheezes.   Cardiovascular: Regular rate and rhythm. No murmur, rub or gallop. No heave. Abdomen: Soft, non-tender, non-distended. Active bowel sounds.   Extremities: none lower extremity edema. No clubbing or cyanosis.  Neuro: Alert, oriented and appropriately interactive. No focal deficits. Speech fluent.   Psychological: normal affect    LABS: last from 2019     COMPLIANCE DATA:  N/A  NEEDS to link the device to the system    gE 1666 07/2018 (<-1422)    Imaging:  CT chest 2012  Unchanged biapical fibronodular pleural parenchymal scarring and calcified subcentimeter nodule within the right middle lobe (2-51), consistent with prior granulomatous disease.   Persistent diffuse peribronchial and peribronchiolar thickening with associated airway irregularity and occasional ectasia, consistent with chronic asthma/bronchitis, with scattered bronchocentric bands and architectural distortion within both upper, right middle and left   greater than right lower lobes, consistent with postinflammatory pleural parenchymal scarring.  Near-complete resolution of mass-like consolidation within the left lower lobe with residual architectural distortion (2-56). Several fluctuating subcentimeter nodules within the left lower lobe and resolved within the right lower lobe since 05/28/2011, likely airway-related inflammatory and mucus impaction.     Pathology:  LLL nodule 05/2011  LUNG NODULE, LEFT LOWER LOBE (BIOPSY):  - Acute and organizing pneumonia (see Comment)  - Prominent chronic inflammation in interstitium  - Gram stain is negative for bacterial organisms  - GMS stain is negative for fungal organisms  - AFB stain is negative for acid-fast bacilli  - No neoplasms identified     PFT 12/2019  FVC  (preBD) 3.60/90% (postBD) 4.26/108%   FEV1 (preBD 1.22/40% (postBD) 1.60/52%  Has positive BD response         PFT 07/2018  FVC 3.95 (94.8%),  FEV1  1.51 (48.7%)  DLCO 28.4 (100%)     PFT 09/2014:  FVC 4.04 (93.7%), post 4.436 (103.6%)  FEV1  1.29 (39.8%), post 1.67 (51.6%)  DLCO 100.7%     PFT 08/2010  FVC 4.80 (109%), post 4.99 (114%)  FEV 1.85 (55%), post 2.01 (60%) ASSESSMENT:  72 y.o. man with  moderate persistent asthma (FEV1 40.1%), intermittent episodes of allergic bronchopulmonary aspergillosis , h/o LLL nodule (bx in 2012 benign) and OSA on PAP presents for follow up. Has been doing well with no issues. No compliance data recently, patient is due for a new device, so can assess data from new device.       Obstructive Sleep Apnea:  Has new DS, which needs to be registered with system.   Continue with PAP therapy, the patient has acclimated well and is experiencing symptomatic improvement.  The patient is willing to continue with PAP therapy.  Will link his device, he will bring his PAP next time to get it linked in system        Asthma  Well controlled  No change in therapy  Repeat PFTs with next appointment , has not done it , will get it scheduled   Will order CBC with D, IgE     I discussed the plan in detail with the patient who voiced understanding and is in agreement.    >~ 25 minutes was spent on this visit.    Follow up: ~6 months, sooner if needed.    Dw Dr. Serena Colonel MD  [facility] Sleep Medicine Fellow

## 2022-10-24 NOTE — Telephone Encounter
Call Back Request      Reason for call back:   pt's assistant calling regarding PFT. pt was advised that the PFT should not be completed until a few months. however, order for current PFT expires on 1/19. pt is scheduled to complete PFT on 1/18. assistant requesting for call back to get clarity on whether the PFT should be completed on the 1/18 or at a future appt. if need to be completed at future appt then a new PFT order will need to be placed. anticipating call back.      Any Symptoms:  []  Yes  [x]  No      If yes, what symptoms are you experiencing:    Duration of symptoms (how long):    Have you taken medication for symptoms (OTC or Rx):      If call was taken outside of clinic hours:    [] Patient or caller has been notified that this message was sent outside of normal clinic hours.     [] Patient or caller has been warm transferred to the physician's answering service. If applicable, patient or caller informed to please call us back if symptoms progress.  Patient or caller has been notified of the turnaround time of 1-2 business day(s).

## 2022-10-25 DIAGNOSIS — R0609 Other forms of dyspnea: Secondary | ICD-10-CM | POA: Diagnosis not present

## 2022-10-25 DIAGNOSIS — D649 Anemia, unspecified: Secondary | ICD-10-CM | POA: Diagnosis not present

## 2022-10-28 ENCOUNTER — Ambulatory Visit: Payer: BLUE CROSS/BLUE SHIELD

## 2022-10-28 DIAGNOSIS — J4489 Chronic asthmatic bronchitis: Secondary | ICD-10-CM

## 2022-10-29 LAB — BASIC METABOLIC PANEL
BUN/Creatinine Ratio: 11 (ref 10–24)
BUN: 11 mg/dL (ref 8–27)
CO2: 34 mmol/L — ABNORMAL HIGH (ref 20–29)
Calcium: 9.3 mg/dL (ref 8.6–10.2)
Chloride: 92 mmol/L — ABNORMAL LOW (ref 96–106)
Creatinine, Ser: 1.03 mg/dL (ref 0.76–1.27)
Glucose: 187 mg/dL — ABNORMAL HIGH (ref 70–99)
Potassium: 4.4 mmol/L (ref 3.5–5.2)
Sodium: 142 mmol/L (ref 134–144)
eGFR: 78 mL/min/{1.73_m2} (ref >59)

## 2022-10-29 LAB — CBC
Hematocrit: 28 % — ABNORMAL LOW (ref 37.5–51.0)
Hemoglobin: 8.2 g/dL — ABNORMAL LOW (ref 13.0–17.7)
MCH: 23.2 pg — ABNORMAL LOW (ref 26.6–33.0)
MCHC: 29.3 g/dL — ABNORMAL LOW (ref 31.5–35.7)
MCV: 79 fL (ref 79–97)
Platelets: 413 10*3/uL (ref 150–450)
RBC: 3.54 x10E6/uL — ABNORMAL LOW (ref 4.14–5.80)
RDW: 15.9 % — ABNORMAL HIGH (ref 11.6–15.4)
WBC: 7.4 10*3/uL (ref 3.4–10.8)

## 2022-10-29 LAB — D-DIMER, QUANTITATIVE: D-DIMER: 1.71 mg/L FEU — ABNORMAL HIGH (ref 0.00–0.49)

## 2022-10-29 LAB — BRAIN NATRIURETIC PEPTIDE: BNP: 101.8 pg/mL — ABNORMAL HIGH (ref 0.0–100.0)

## 2022-10-31 ENCOUNTER — Ambulatory Visit: Payer: MEDICARE

## 2022-11-01 NOTE — Telephone Encounter
Spoke to pt assistant Lorin Glass [phone])    Advised that new order was placed by Dr. Rozanna Box and ok to schedule appropriate PFT appt for pt now

## 2022-11-08 ENCOUNTER — Ambulatory Visit (HOSPITAL_COMMUNITY): Admission: RE | Admit: 2022-11-08 | Payer: Medicare Other | Source: Ambulatory Visit

## 2022-11-11 ENCOUNTER — Telehealth (INDEPENDENT_AMBULATORY_CARE_PROVIDER_SITE_OTHER): Payer: Self-pay | Admitting: *Deleted

## 2022-11-11 ENCOUNTER — Encounter (INDEPENDENT_AMBULATORY_CARE_PROVIDER_SITE_OTHER): Payer: Self-pay | Admitting: Gastroenterology

## 2022-11-11 ENCOUNTER — Ambulatory Visit: Payer: Medicare Other | Admitting: Gastroenterology

## 2022-11-11 ENCOUNTER — Ambulatory Visit (INDEPENDENT_AMBULATORY_CARE_PROVIDER_SITE_OTHER): Payer: Medicare Other | Admitting: Gastroenterology

## 2022-11-11 VITALS — BP 140/70

## 2022-11-11 DIAGNOSIS — D509 Iron deficiency anemia, unspecified: Secondary | ICD-10-CM

## 2022-11-11 MED ORDER — FERROUS SULFATE 325 (65 FE) MG PO TABS: 325.0000 mg | ORAL_TABLET | Freq: Every day | ORAL | 3 refills | Status: DC

## 2022-11-11 NOTE — H&P (View-Only) (Signed)
Primary Care Physician:  Jake Samples, PA-C  Primary GI: Jenetta Downer   Patient Location: Home   Provider Location: [facility]   Reason for Visit: hospital follow up of anemia    Persons present on the virtual encounter, with roles: Mark Self L. Alver Sorrow, MSN, APRN, AGNP-C, Alyson Locket. [name]   Total time (minutes) spent on medical discussion: 10 minutes  Virtual Visit via telephone visit is conducted virtually and was requested by patient.   I connected with Mark Cannon on 11/11/22 at  2:30 PM EST by telephone and verified that I am speaking with the correct person using two identifiers.   I discussed the limitations, risks, security and privacy concerns of performing an evaluation and management service by telephone and the availability of in person appointments. I also discussed with the patient that there may be a patient responsible charge related to this service. The patient expressed understanding and agreed to proceed.  Chief Complaint  Patient presents with   Anemia    Follow up on anemia. Has some sob. Reports it has been that way since leaving the hospital.    History of Present Illness: Mark Cannon is a 72 y.o. male with past medical history of GERD, prostate cancer status post radiation therapy, HLD, HTN, COPD  and OSA   Presenting today for follow up of hospital admission for IDA   History:  intermittent rectal bleeding since 2018 when he had radiation therapy for prostate cancer. Seen 07/2021 Reported fresh blood on top of stool that would occur 3-4 days then clear up. Patient recommended to proceed with colonoscopy for further evaluation. Colonoscopy was not completed.    hospital admission on 08/08/22 for SOB and near syncope. Hgb on admission was 7.6 with negative FOBT. Iron 44, TIBC 449, saturation 10%, ferritin 11, transfused 1 unit PRBCs, also received IV iron x1.    Last OV 08/12/22, hgb on 10/27 8.9   Patient reported feeling  lightheaded and dizzy. one episode of syncope prior to recent ED visit. Feeling better now after getting IV iron and PRBC transfusion in the hospital. still seeing occasional toilet tissue hematochezia though feels this has slowed down since his last OV. did not complete colonoscopy as previously recommended as he is concerned about drinking the prep and getting to the restroom on time. Maintained on supplemental Oxygen at 2L during the day and 4L at night chronically due to COPD.   Recommended to proceed with EGD and colonoscopy, repeat CBC, iron rich diet. Repeat CBC was not completed, nor was EGD/Colonoscopy  ED visit on 12/20 for dyspnea, hemoglobin 6.5; he is received 2 units of packed red blood cells hemoglobin 8.5. Stool for occult blood testing negative in the ED. Ferritin 8,  iron 107, TIBC 469, sat 23. Underwent EGD and Colonoscopy with findings as below, advised to start PPI and ferrous sulfate once daily.   Last hgb on 10/25/22 8.2   Present:  Patient reports he is having more SOB today prompting visit to be changed to virtual. Notes he is currently on 4L supplemental O2 constantly now. He denies dizziness or syncope. He is having desaturation of around 10% when he gets up to move around. Denies any abdominal pain. No rectal bleeding, has noted some dark, looser stools but does not think these are black. Weight and appetite are stable. He is not taking iron pills as he was not aware that he was supposed to. He is taking protonix 83m daily.  No nausea, vomiting.    EGD- 10/15/22 Z-line regular, 38 cm from the incisors. - Normal stomach.- Normal duodenal bulb, first portion of the  duodenum and second portion of the duodenum. - No specimens collected. Colonoscopy: 10/15/22- Diverticulosis in the sigmoid colon and in the  descending colon. - One 5 mm polyp in the cecum - One 15 mm polyp in the ascending colon - Five 6 to 10 mm polyps in the descending colon and in the transverse colon  - One  12 mm polyp in the sigmoid colon  Repeat colonoscopy in 3 years   Past Medical History:  Diagnosis Date   Arthritis    Cancer [facility])    Prostate, has had prostate removed, has had radiation and still has cancer,   Colon polyps    Cough    GERD (gastroesophageal reflux disease)    occasional    H/O tooth extraction week of 03/15/2015    History of kidney stones    Hypercholesteremia    Hypertension    Lumbar stenosis with neurogenic claudication    Pneumonia    Pre-diabetes    Renal disorder    Sleep apnea    No cpap     Past Surgical History:  Procedure Laterality Date   APPENDECTOMY     BACK SURGERY     CHOLECYSTECTOMY     COLONOSCOPY  02/04/2012   Procedure: COLONOSCOPY;  Surgeon: Jamesetta So, MD;  [facility];  Service: Gastroenterology;  Laterality: N/A;   COLONOSCOPY N/A 09/22/2018   Procedure: COLONOSCOPY;  Surgeon: Aviva Signs, MD;  [facility];  Service: Gastroenterology;  Laterality: N/A;   COLONOSCOPY W/ POLYPECTOMY     COLONOSCOPY WITH PROPOFOL N/A 10/15/2022   Procedure: COLONOSCOPY WITH PROPOFOL;  Surgeon: Eloise Harman, DO;  [facility];  Service: Endoscopy;  Laterality: N/A;   ESOPHAGOGASTRODUODENOSCOPY (EGD) WITH PROPOFOL N/A 10/15/2022   Procedure: ESOPHAGOGASTRODUODENOSCOPY (EGD) WITH PROPOFOL;  Surgeon: Eloise Harman, DO;  [facility];  Service: Endoscopy;  Laterality: N/A;   HERNIA REPAIR     Bilateral inguinal hernia repair X 4   hydrocelectomy     left knee arthroscopy     LITHOTRIPSY     LYMPHADENECTOMY Bilateral 03/22/2015   Procedure: PELVIC LYMPHADENECTOMY;  Surgeon: Alexis Frock, MD;  Location: [facility];  Service: Urology;  Laterality: Bilateral;   NM MYOVIEW LTD  2011   POLYPECTOMY  09/22/2018   Procedure: POLYPECTOMY;  Surgeon: Aviva Signs, MD;  [facility];  Service: Gastroenterology;;   POLYPECTOMY  10/15/2022   Procedure: POLYPECTOMY;  Surgeon: Eloise Harman, DO;   [facility];  Service: Endoscopy;;   ROBOT ASSISTED LAPAROSCOPIC RADICAL PROSTATECTOMY N/A 03/22/2015   Procedure: ROBOTIC ASSISTED LAPAROSCOPIC RADICAL PROSTATECTOMY WITH INDOCYANINE GREEN DYE INJECTION;  Surgeon: Alexis Frock, MD;  Location: [facility];  Service: Urology;  Laterality: N/A;   ROBOTIC ASSISTED LAPAROSCOPIC LYSIS OF ADHESION  03/22/2015   Procedure: ROBOTIC ASSISTED LAPAROSCOPIC LYSIS OF ADHESION;  Surgeon: Alexis Frock, MD;  Location: [facility];  Service: Urology;;   SHOULDER SURGERY Right    VASECTOMY       Current Meds  Medication Sig   amLODipine (NORVASC) 5 MG tablet Take 1 tablet (5 mg total) by mouth daily.   furosemide (LASIX) 40 MG tablet Take 1 tablet (40 mg total) by mouth daily.   glimepiride (AMARYL) 1 MG tablet Take 1 mg by mouth 2 (two) times daily.   ipratropium-albuterol (  DUONEB) 0.5-2.5 (3) MG/3ML SOLN Take 3 mLs by nebulization every 6 (six) hours as needed (SOB/wheezing.).   OXYGEN Inhale 4 L into the lungs continuous.   pantoprazole (PROTONIX) 40 MG tablet Take 1 tablet (40 mg total) by mouth daily.   PRESCRIPTION MEDICATION Hormone therapy injection every 6 months through [facility] due to prostate cancer.     Family History  Problem Relation Age of Onset   Heart failure Father    Stroke Father    Colon cancer Neg Hx     Social History   Socioeconomic History   Marital status: Married    Spouse name: Not on file   Number of children: Not on file   Years of education: Not on file   Highest education level: Not on file  Occupational History   Not on file  Tobacco Use   Smoking status: Former    Packs/day: 1.00    Years: 55.00    Total pack years: 55.00    Types: Cigarettes    Passive exposure: Past   Smokeless tobacco: Never  Vaping Use   Vaping Use: Never used  Substance and Sexual Activity   Alcohol use: Not Currently    Alcohol/week: 1.0 standard drink of alcohol    Types: 1 Glasses of wine per week    Comment:  one glass of wine once a month   Drug use: No   Sexual activity: Not on file  Other Topics Concern   Not on file  Social History Narrative   Not on file   Social Determinants of Health   Financial Resource Strain: Not on file  Food Insecurity: No Food Insecurity (10/13/2022)   Hunger Vital Sign    Worried About Running Out of Food in the Last Year: Never true    Ran Out of Food in the Last Year: Never true  Transportation Needs: No Transportation Needs (10/13/2022)   PRAPARE - Hydrologist (Medical): No    Lack of Transportation (Non-Medical): No  Physical Activity: Not on file  Stress: Not on file  Social Connections: Not on file   Review of Systems: Gen: Denies fever, chills, anorexia. Denies fatigue, weakness, weight loss.  CV: Denies chest pain, palpitations, syncope, peripheral edema, and claudication. Resp: Denies cough, wheezing, coughing up blood, and pleurisy. +dyspnea  GI: see HPI Derm: Denies rash, itching, dry skin Psych: Denies depression, anxiety, memory loss, confusion. No homicidal or suicidal ideation.  Heme: Denies bruising, bleeding, and enlarged lymph nodes.  Observations/Objective: No distress. Unable to perform physical exam due to telephone encounter. No video available.   Assessment and Plan: Mark Cannon is a 72 y.o. male with past medical history of GERD, prostate cancer status post radiation therapy, HLD, HTN, COPD  and OSA who presents for hospital follow up of IDA.  Recent admission in December 2023 for IDA, EGD and Colonoscopy without findings to explain IDA. Last hgb on 1/12 was 8.2. no rectal bleeding or melena. He continues to have SOB with increase supplemental O2 demand to 4L chronically, though likely multifactorial in setting of COPD and anemia. Was sent PO iron at recent discharge but was unaware he needed to be taking it so he is not currently on iron. Will resend ferrous sulfate 333m PO to pharmacy. As EGD  and colonoscopy were unrevealing in regards to etiology of his continued IDA, Recommend proceeding with givens capsule study for further evaluation of IDA. Will also check iron studies as last  were in December and low. Indications, risks and benefits of procedure discussed in detail with patient. Patient verbalized understanding and is in agreement to proceed with Givens Capsule study.    -check iron studies -start PO Iron, will resend Rx -Continue PPI daily -perform Givens capsule study  -pt to make me aware of any rectal bleeding or melena    Follow Up Instructions: F/u 2 months    I discussed the assessment and treatment plan with the patient. The patient was provided an opportunity to ask questions and all were answered. The patient agreed with the plan and demonstrated an understanding of the instructions.   The patient was advised to call back or seek an in-person evaluation if the symptoms worsen or if the condition fails to improve as anticipated.  I provided 10 minutes of NON face-to-face time during this telephone encounter.  Mark Briles L. Alver Sorrow, MSN, APRN, AGNP-C Adult-Gerontology Nurse Practitioner [facility]  I have reviewed the note and agree with the APP's assessment as described in this progress note  Maylon Peppers, MD Gastroenterology and Hepatology [facility]

## 2022-11-11 NOTE — Telephone Encounter (Signed)
LMOVM to call back to schedule givens capsule study

## 2022-11-11 NOTE — Progress Notes (Unsigned)
 Primary Care Physician:  Jackson, Samantha J, PA-C  Primary GI: [facility]   Patient Location: Home   Provider Location: [facility]   Reason for Visit: hospital follow up of anemia    Persons present on the virtual encounter, with roles: Katurah Karapetian L. Nikka Hakimian, MSN, APRN, AGNP-C, Lacharles M. Hammond "Mike" patient   Total time (minutes) spent on medical discussion: 10 minutes  Virtual Visit via telephone visit is conducted virtually and was requested by patient.   I connected with Jasaun M Bickley on 11/11/22 at  2:30 PM EST by telephone and verified that I am speaking with the correct person using two identifiers.   I discussed the limitations, risks, security and privacy concerns of performing an evaluation and management service by telephone and the availability of in person appointments. I also discussed with the patient that there may be a patient responsible charge related to this service. The patient expressed understanding and agreed to proceed.  Chief Complaint  Patient presents with   Anemia    Follow up on anemia. Has some sob. Reports it has been that way since leaving the hospital.    History of Present Illness: Jewett M Glennie is a 72 y.o. male with past medical history of GERD, prostate cancer status post radiation therapy, HLD, HTN, COPD  and OSA   Presenting today for follow up of hospital admission for IDA   History:  intermittent rectal bleeding since 2018 when he had radiation therapy for prostate cancer. Seen 07/2021 Reported fresh blood on top of stool that would occur 3-4 days then clear up. Patient recommended to proceed with colonoscopy for further evaluation. Colonoscopy was not completed.    hospital admission on 08/08/22 for SOB and near syncope. Hgb on admission was 7.6 with negative FOBT. Iron 44, TIBC 449, saturation 10%, ferritin 11, transfused 1 unit PRBCs, also received IV iron x1.    Last OV 08/12/22, hgb on 10/27 8.9   Patient reported feeling  lightheaded and dizzy. one episode of syncope prior to recent ED visit. Feeling better now after getting IV iron and PRBC transfusion in the hospital. still seeing occasional toilet tissue hematochezia though feels this has slowed down since his last OV. did not complete colonoscopy as previously recommended as he is concerned about drinking the prep and getting to the restroom on time. Maintained on supplemental Oxygen at 2L during the day and 4L at night chronically due to COPD.   Recommended to proceed with EGD and colonoscopy, repeat CBC, iron rich diet. Repeat CBC was not completed, nor was EGD/Colonoscopy  ED visit on 12/20 for dyspnea, hemoglobin 6.5; he is received 2 units of packed red blood cells hemoglobin 8.5. Stool for occult blood testing negative in the ED. Ferritin 8,  iron 107, TIBC 469, sat 23. Underwent EGD and Colonoscopy with findings as below, advised to start PPI and ferrous sulfate once daily.   Last hgb on 10/25/22 8.2   Present:  Patient reports he is having more SOB today prompting visit to be changed to virtual. Notes he is currently on 4L supplemental O2 constantly now. He denies dizziness or syncope. He is having desaturation of around 10% when he gets up to move around. Denies any abdominal pain. No rectal bleeding, has noted some dark, looser stools but does not think these are black. Weight and appetite are stable. He is not taking iron pills as he was not aware that he was supposed to. He is taking protonix 40mg daily.   No nausea, vomiting.    EGD- 10/15/22 Z-line regular, 38 cm from the incisors. - Normal stomach.- Normal duodenal bulb, first portion of the  duodenum and second portion of the duodenum. - No specimens collected. Colonoscopy: 10/15/22- Diverticulosis in the sigmoid colon and in the  descending colon. - One 5 mm polyp in the cecum - One 15 mm polyp in the ascending colon - Five 6 to 10 mm polyps in the descending colon and in the transverse colon  - One  12 mm polyp in the sigmoid colon  Repeat colonoscopy in 3 years   Past Medical History:  Diagnosis Date   Arthritis    Cancer (HCC)    Prostate, has had prostate removed, has had radiation and still has cancer,   Colon polyps    Cough    GERD (gastroesophageal reflux disease)    occasional    H/O tooth extraction week of 03/15/2015    History of kidney stones    Hypercholesteremia    Hypertension    Lumbar stenosis with neurogenic claudication    Pneumonia    Pre-diabetes    Renal disorder    Sleep apnea    No cpap     Past Surgical History:  Procedure Laterality Date   APPENDECTOMY     BACK SURGERY     CHOLECYSTECTOMY     COLONOSCOPY  02/04/2012   Procedure: COLONOSCOPY;  Surgeon: Mark A Jenkins, MD;  Location: [facility];  Service: Gastroenterology;  Laterality: N/A;   COLONOSCOPY N/A 09/22/2018   Procedure: COLONOSCOPY;  Surgeon: Jenkins, Mark, MD;  Location: [facility];  Service: Gastroenterology;  Laterality: N/A;   COLONOSCOPY W/ POLYPECTOMY     COLONOSCOPY WITH PROPOFOL N/A 10/15/2022   Procedure: COLONOSCOPY WITH PROPOFOL;  Surgeon: Carver, Charles K, DO;  Location: [facility];  Service: Endoscopy;  Laterality: N/A;   ESOPHAGOGASTRODUODENOSCOPY (EGD) WITH PROPOFOL N/A 10/15/2022   Procedure: ESOPHAGOGASTRODUODENOSCOPY (EGD) WITH PROPOFOL;  Surgeon: Carver, Charles K, DO;  Location: [facility];  Service: Endoscopy;  Laterality: N/A;   HERNIA REPAIR     Bilateral inguinal hernia repair X 4   hydrocelectomy     left knee arthroscopy     LITHOTRIPSY     LYMPHADENECTOMY Bilateral 03/22/2015   Procedure: PELVIC LYMPHADENECTOMY;  Surgeon: Theodore Manny, MD;  Location: [facility];  Service: Urology;  Laterality: Bilateral;   NM MYOVIEW LTD  2011   POLYPECTOMY  09/22/2018   Procedure: POLYPECTOMY;  Surgeon: Jenkins, Mark, MD;  Location: [facility];  Service: Gastroenterology;;   POLYPECTOMY  10/15/2022   Procedure: POLYPECTOMY;  Surgeon: Carver, Charles K, DO;   Location: [facility];  Service: Endoscopy;;   ROBOT ASSISTED LAPAROSCOPIC RADICAL PROSTATECTOMY N/A 03/22/2015   Procedure: ROBOTIC ASSISTED LAPAROSCOPIC RADICAL PROSTATECTOMY WITH INDOCYANINE GREEN DYE INJECTION;  Surgeon: Theodore Manny, MD;  Location: [facility];  Service: Urology;  Laterality: N/A;   ROBOTIC ASSISTED LAPAROSCOPIC LYSIS OF ADHESION  03/22/2015   Procedure: ROBOTIC ASSISTED LAPAROSCOPIC LYSIS OF ADHESION;  Surgeon: Theodore Manny, MD;  Location: [facility];  Service: Urology;;   SHOULDER SURGERY Right    VASECTOMY       Current Meds  Medication Sig   amLODipine (NORVASC) 5 MG tablet Take 1 tablet (5 mg total) by mouth daily.   furosemide (LASIX) 40 MG tablet Take 1 tablet (40 mg total) by mouth daily.   glimepiride (AMARYL) 1 MG tablet Take 1 mg by mouth 2 (two) times daily.   ipratropium-albuterol (  DUONEB) 0.5-2.5 (3) MG/3ML SOLN Take 3 mLs by nebulization every 6 (six) hours as needed (SOB/wheezing.).   OXYGEN Inhale 4 L into the lungs continuous.   pantoprazole (PROTONIX) 40 MG tablet Take 1 tablet (40 mg total) by mouth daily.   PRESCRIPTION MEDICATION Hormone therapy injection every 6 months through [facility] due to prostate cancer.     Family History  Problem Relation Age of Onset   Heart failure Father    Stroke Father    Colon cancer Neg Hx     Social History   Socioeconomic History   Marital status: Married    Spouse name: Not on file   Number of children: Not on file   Years of education: Not on file   Highest education level: Not on file  Occupational History   Not on file  Tobacco Use   Smoking status: Former    Packs/day: 1.00    Years: 55.00    Total pack years: 55.00    Types: Cigarettes    Passive exposure: Past   Smokeless tobacco: Never  Vaping Use   Vaping Use: Never used  Substance and Sexual Activity   Alcohol use: Not Currently    Alcohol/week: 1.0 standard drink of alcohol    Types: 1 Glasses of wine per week    Comment:  one glass of wine once a month   Drug use: No   Sexual activity: Not on file  Other Topics Concern   Not on file  Social History Narrative   Not on file   Social Determinants of Health   Financial Resource Strain: Not on file  Food Insecurity: No Food Insecurity (10/13/2022)   Hunger Vital Sign    Worried About Running Out of Food in the Last Year: Never true    Ran Out of Food in the Last Year: Never true  Transportation Needs: No Transportation Needs (10/13/2022)   PRAPARE - Transportation    Lack of Transportation (Medical): No    Lack of Transportation (Non-Medical): No  Physical Activity: Not on file  Stress: Not on file  Social Connections: Not on file   Review of Systems: Gen: Denies fever, chills, anorexia. Denies fatigue, weakness, weight loss.  CV: Denies chest pain, palpitations, syncope, peripheral edema, and claudication. Resp: Denies cough, wheezing, coughing up blood, and pleurisy. +dyspnea  GI: see HPI Derm: Denies rash, itching, dry skin Psych: Denies depression, anxiety, memory loss, confusion. No homicidal or suicidal ideation.  Heme: Denies bruising, bleeding, and enlarged lymph nodes.  Observations/Objective: No distress. Unable to perform physical exam due to telephone encounter. No video available.   Assessment and Plan: Elise M Mallo is a 72 y.o. male with past medical history of GERD, prostate cancer status post radiation therapy, HLD, HTN, COPD  and OSA who presents for hospital follow up of IDA.  Recent admission in December 2023 for IDA, EGD and Colonoscopy without findings to explain IDA. Last hgb on 1/12 was 8.2. no rectal bleeding or melena. He continues to have SOB with increase supplemental O2 demand to 4L chronically, though likely multifactorial in setting of COPD and anemia. Was sent PO iron at recent discharge but was unaware he needed to be taking it so he is not currently on iron. Will resend ferrous sulfate 325mg PO to pharmacy. As EGD  and colonoscopy were unrevealing in regards to etiology of his continued IDA, Recommend proceeding with givens capsule study for further evaluation of IDA. Will also check iron studies as last   were in December and low. Indications, risks and benefits of procedure discussed in detail with patient. Patient verbalized understanding and is in agreement to proceed with Givens Capsule study.    -check iron studies -start PO Iron, will resend Rx -Continue PPI daily -perform Givens capsule study  -pt to make me aware of any rectal bleeding or melena    Follow Up Instructions: F/u 2 months    I discussed the assessment and treatment plan with the patient. The patient was provided an opportunity to ask questions and all were answered. The patient agreed with the plan and demonstrated an understanding of the instructions.   The patient was advised to call back or seek an in-person evaluation if the symptoms worsen or if the condition fails to improve as anticipated.  I provided 10 minutes of NON face-to-face time during this telephone encounter.  Terry Abila L. Shanterria Franta, MSN, APRN, AGNP-C Adult-Gerontology Nurse Practitioner [facility]  I have reviewed the note and agree with the APP's assessment as described in this progress note  Daniel Castaneda, MD Gastroenterology and Hepatology [facility]  

## 2022-11-11 NOTE — Patient Instructions (Signed)
Please continue to take your protonix '40mg'$  once daily I am sending ferrous sulfate '325mg'$  for you to take daily (iron pill), this can cause some constipation and turn stools very dark or green, this is normal. I will see if we can add on iron studies to your previously drawn blood, if not we will contact you to have your iron levels checked at the lab Someone will be in touch to schedule the capsule study that we discussed over the phone   Follow up 2 months

## 2022-11-13 ENCOUNTER — Observation Stay (HOSPITAL_COMMUNITY): Payer: Medicare Other

## 2022-11-13 ENCOUNTER — Other Ambulatory Visit: Payer: Self-pay

## 2022-11-13 ENCOUNTER — Encounter (HOSPITAL_COMMUNITY): Payer: Self-pay | Admitting: Emergency Medicine

## 2022-11-13 ENCOUNTER — Emergency Department (HOSPITAL_COMMUNITY): Payer: Medicare Other

## 2022-11-13 ENCOUNTER — Inpatient Hospital Stay (HOSPITAL_COMMUNITY)
Admission: EM | Admit: 2022-11-13 | Discharge: 2022-11-15 | DRG: 291 | Disposition: A | Discharge status: Home Health | Payer: Medicare Other | Attending: Family Medicine | Admitting: Family Medicine

## 2022-11-13 DIAGNOSIS — Z9981 Dependence on supplemental oxygen: Secondary | ICD-10-CM | POA: Diagnosis not present

## 2022-11-13 DIAGNOSIS — J9811 Atelectasis: Secondary | ICD-10-CM | POA: Diagnosis not present

## 2022-11-13 DIAGNOSIS — M1990 Unspecified osteoarthritis, unspecified site: Secondary | ICD-10-CM | POA: Diagnosis present

## 2022-11-13 DIAGNOSIS — D509 Iron deficiency anemia, unspecified: Secondary | ICD-10-CM | POA: Diagnosis not present

## 2022-11-13 DIAGNOSIS — I509 Heart failure, unspecified: Secondary | ICD-10-CM | POA: Diagnosis not present

## 2022-11-13 DIAGNOSIS — Z923 Personal history of irradiation: Secondary | ICD-10-CM | POA: Diagnosis not present

## 2022-11-13 DIAGNOSIS — I110 Hypertensive heart disease with heart failure: Secondary | ICD-10-CM | POA: Diagnosis not present

## 2022-11-13 DIAGNOSIS — D649 Anemia, unspecified: Secondary | ICD-10-CM

## 2022-11-13 DIAGNOSIS — J189 Pneumonia, unspecified organism: Secondary | ICD-10-CM | POA: Diagnosis not present

## 2022-11-13 DIAGNOSIS — E1165 Type 2 diabetes mellitus with hyperglycemia: Secondary | ICD-10-CM | POA: Diagnosis present

## 2022-11-13 DIAGNOSIS — Z6834 Body mass index (BMI) 34.0-34.9, adult: Secondary | ICD-10-CM

## 2022-11-13 DIAGNOSIS — E66812 Obesity, class 2: Secondary | ICD-10-CM | POA: Diagnosis present

## 2022-11-13 DIAGNOSIS — K625 Hemorrhage of anus and rectum: Secondary | ICD-10-CM | POA: Diagnosis present

## 2022-11-13 DIAGNOSIS — I130 Hypertensive heart and chronic kidney disease with heart failure and stage 1 through stage 4 chronic kidney disease, or unspecified chronic kidney disease: Secondary | ICD-10-CM | POA: Diagnosis not present

## 2022-11-13 DIAGNOSIS — Z87442 Personal history of urinary calculi: Secondary | ICD-10-CM

## 2022-11-13 DIAGNOSIS — N1831 Chronic kidney disease, stage 3a: Secondary | ICD-10-CM | POA: Diagnosis not present

## 2022-11-13 DIAGNOSIS — Z9049 Acquired absence of other specified parts of digestive tract: Secondary | ICD-10-CM

## 2022-11-13 DIAGNOSIS — E669 Obesity, unspecified: Secondary | ICD-10-CM | POA: Diagnosis present

## 2022-11-13 DIAGNOSIS — D500 Iron deficiency anemia secondary to blood loss (chronic): Secondary | ICD-10-CM | POA: Diagnosis not present

## 2022-11-13 DIAGNOSIS — M48062 Spinal stenosis, lumbar region with neurogenic claudication: Secondary | ICD-10-CM | POA: Diagnosis present

## 2022-11-13 DIAGNOSIS — J9621 Acute and chronic respiratory failure with hypoxia: Secondary | ICD-10-CM | POA: Diagnosis present

## 2022-11-13 DIAGNOSIS — I5031 Acute diastolic (congestive) heart failure: Secondary | ICD-10-CM | POA: Diagnosis present

## 2022-11-13 DIAGNOSIS — Z23 Encounter for immunization: Secondary | ICD-10-CM | POA: Diagnosis not present

## 2022-11-13 DIAGNOSIS — J9600 Acute respiratory failure, unspecified whether with hypoxia or hypercapnia: Secondary | ICD-10-CM | POA: Diagnosis present

## 2022-11-13 DIAGNOSIS — K219 Gastro-esophageal reflux disease without esophagitis: Secondary | ICD-10-CM | POA: Diagnosis present

## 2022-11-13 DIAGNOSIS — J9620 Acute and chronic respiratory failure, unspecified whether with hypoxia or hypercapnia: Secondary | ICD-10-CM | POA: Diagnosis present

## 2022-11-13 DIAGNOSIS — K7460 Unspecified cirrhosis of liver: Secondary | ICD-10-CM | POA: Diagnosis present

## 2022-11-13 DIAGNOSIS — I639 Cerebral infarction, unspecified: Secondary | ICD-10-CM | POA: Diagnosis present

## 2022-11-13 DIAGNOSIS — J9622 Acute and chronic respiratory failure with hypercapnia: Secondary | ICD-10-CM | POA: Diagnosis present

## 2022-11-13 DIAGNOSIS — M48061 Spinal stenosis, lumbar region without neurogenic claudication: Secondary | ICD-10-CM | POA: Diagnosis present

## 2022-11-13 DIAGNOSIS — J90 Pleural effusion, not elsewhere classified: Secondary | ICD-10-CM

## 2022-11-13 DIAGNOSIS — K921 Melena: Secondary | ICD-10-CM | POA: Diagnosis present

## 2022-11-13 DIAGNOSIS — Z7982 Long term (current) use of aspirin: Secondary | ICD-10-CM

## 2022-11-13 DIAGNOSIS — C61 Malignant neoplasm of prostate: Secondary | ICD-10-CM | POA: Diagnosis present

## 2022-11-13 DIAGNOSIS — K922 Gastrointestinal hemorrhage, unspecified: Secondary | ICD-10-CM

## 2022-11-13 DIAGNOSIS — E7800 Pure hypercholesterolemia, unspecified: Secondary | ICD-10-CM | POA: Diagnosis present

## 2022-11-13 DIAGNOSIS — Z8673 Personal history of transient ischemic attack (TIA), and cerebral infarction without residual deficits: Secondary | ICD-10-CM

## 2022-11-13 DIAGNOSIS — Z8546 Personal history of malignant neoplasm of prostate: Secondary | ICD-10-CM | POA: Diagnosis present

## 2022-11-13 DIAGNOSIS — J918 Pleural effusion in other conditions classified elsewhere: Secondary | ICD-10-CM | POA: Diagnosis present

## 2022-11-13 DIAGNOSIS — J449 Chronic obstructive pulmonary disease, unspecified: Secondary | ICD-10-CM | POA: Diagnosis not present

## 2022-11-13 DIAGNOSIS — Z9079 Acquired absence of other genital organ(s): Secondary | ICD-10-CM

## 2022-11-13 DIAGNOSIS — J441 Chronic obstructive pulmonary disease with (acute) exacerbation: Secondary | ICD-10-CM | POA: Diagnosis present

## 2022-11-13 DIAGNOSIS — Z79899 Other long term (current) drug therapy: Secondary | ICD-10-CM

## 2022-11-13 DIAGNOSIS — J9601 Acute respiratory failure with hypoxia: Principal | ICD-10-CM

## 2022-11-13 DIAGNOSIS — Z7722 Contact with and (suspected) exposure to environmental tobacco smoke (acute) (chronic): Secondary | ICD-10-CM | POA: Diagnosis present

## 2022-11-13 DIAGNOSIS — Z1152 Encounter for screening for COVID-19: Secondary | ICD-10-CM

## 2022-11-13 DIAGNOSIS — Z7984 Long term (current) use of oral hypoglycemic drugs: Secondary | ICD-10-CM

## 2022-11-13 DIAGNOSIS — K5521 Angiodysplasia of colon with hemorrhage: Secondary | ICD-10-CM | POA: Diagnosis not present

## 2022-11-13 DIAGNOSIS — R739 Hyperglycemia, unspecified: Secondary | ICD-10-CM | POA: Diagnosis not present

## 2022-11-13 DIAGNOSIS — Z8249 Family history of ischemic heart disease and other diseases of the circulatory system: Secondary | ICD-10-CM

## 2022-11-13 DIAGNOSIS — I10 Essential (primary) hypertension: Secondary | ICD-10-CM | POA: Diagnosis not present

## 2022-11-13 DIAGNOSIS — Z87891 Personal history of nicotine dependence: Secondary | ICD-10-CM

## 2022-11-13 DIAGNOSIS — R0902 Hypoxemia: Secondary | ICD-10-CM | POA: Diagnosis not present

## 2022-11-13 DIAGNOSIS — R846 Abnormal cytological findings in specimens from respiratory organs and thorax: Secondary | ICD-10-CM | POA: Diagnosis not present

## 2022-11-13 DIAGNOSIS — G4733 Obstructive sleep apnea (adult) (pediatric): Secondary | ICD-10-CM | POA: Diagnosis present

## 2022-11-13 DIAGNOSIS — Z888 Allergy status to other drugs, medicaments and biological substances status: Secondary | ICD-10-CM

## 2022-11-13 DIAGNOSIS — R069 Unspecified abnormalities of breathing: Secondary | ICD-10-CM | POA: Diagnosis not present

## 2022-11-13 DIAGNOSIS — Z823 Family history of stroke: Secondary | ICD-10-CM

## 2022-11-13 LAB — BASIC METABOLIC PANEL
Anion gap: 8 (ref 5–15)
BUN: 10 mg/dL (ref 8–23)
CO2: 40 mmol/L — ABNORMAL HIGH (ref 22–32)
Calcium: 8.6 mg/dL — ABNORMAL LOW (ref 8.9–10.3)
Chloride: 90 mmol/L — ABNORMAL LOW (ref 98–111)
Creatinine, Ser: 0.95 mg/dL (ref 0.61–1.24)
GFR, Estimated: >60 mL/min (ref >60)
Glucose, Bld: 183 mg/dL — ABNORMAL HIGH (ref 70–99)
Potassium: 4.3 mmol/L (ref 3.5–5.1)
Sodium: 138 mmol/L (ref 135–145)

## 2022-11-13 LAB — FERRITIN: Ferritin: 15 ng/mL — ABNORMAL LOW (ref 24–336)

## 2022-11-13 LAB — CBC WITH DIFFERENTIAL/PLATELET
Abs Immature Granulocytes: 0.04 10*3/uL (ref 0.00–0.07)
Basophils Absolute: 0 10*3/uL (ref 0.0–0.1)
Basophils Relative: 0 %
Eosinophils Absolute: 0.2 10*3/uL (ref 0.0–0.5)
Eosinophils Relative: 3 %
HCT: 28.5 % — ABNORMAL LOW (ref 39.0–52.0)
Hemoglobin: 7.6 g/dL — ABNORMAL LOW (ref 13.0–17.0)
Immature Granulocytes: 1 %
Lymphocytes Relative: 8 %
Lymphs Abs: 0.6 10*3/uL — ABNORMAL LOW (ref 0.7–4.0)
MCH: 22.1 pg — ABNORMAL LOW (ref 26.0–34.0)
MCHC: 26.7 g/dL — ABNORMAL LOW (ref 30.0–36.0)
MCV: 82.8 fL (ref 80.0–100.0)
Monocytes Absolute: 0.4 10*3/uL (ref 0.1–1.0)
Monocytes Relative: 6 %
Neutro Abs: 6.6 10*3/uL (ref 1.7–7.7)
Neutrophils Relative %: 82 %
Platelets: 249 10*3/uL (ref 150–400)
RBC: 3.44 MIL/uL — ABNORMAL LOW (ref 4.22–5.81)
RDW: 16.7 % — ABNORMAL HIGH (ref 11.5–15.5)
WBC: 7.9 10*3/uL (ref 4.0–10.5)
nRBC: 0 % (ref 0.0–0.2)

## 2022-11-13 LAB — BODY FLUID CELL COUNT WITH DIFFERENTIAL
Lymphs, Fluid: 51 %
Monocyte-Macrophage-Serous Fluid: 35 % — ABNORMAL LOW (ref 50–90)
Neutrophil Count, Fluid: 14 % (ref 0–25)
Total Nucleated Cell Count, Fluid: 690 cu mm (ref 0–1000)

## 2022-11-13 LAB — RESP PANEL BY RT-PCR (RSV, FLU A&B, COVID)  RVPGX2
Influenza A by PCR: NEGATIVE
Influenza B by PCR: NEGATIVE
Resp Syncytial Virus by PCR: NEGATIVE
SARS Coronavirus 2 by RT PCR: NEGATIVE

## 2022-11-13 LAB — MAGNESIUM: Magnesium: 2.3 mg/dL (ref 1.7–2.4)

## 2022-11-13 LAB — IRON AND TIBC
Iron: 151 ug/dL (ref 45–182)
Saturation Ratios: 33 % (ref 17.9–39.5)
TIBC: 462 ug/dL — ABNORMAL HIGH (ref 250–450)
UIBC: 311 ug/dL

## 2022-11-13 LAB — TROPONIN I (HIGH SENSITIVITY)
Troponin I (High Sensitivity): 17 ng/L (ref <18)
Troponin I (High Sensitivity): 19 ng/L — ABNORMAL HIGH (ref <18)

## 2022-11-13 LAB — BRAIN NATRIURETIC PEPTIDE: B Natriuretic Peptide: 183 pg/mL — ABNORMAL HIGH (ref 0.0–100.0)

## 2022-11-13 LAB — PROTEIN, PLEURAL OR PERITONEAL FLUID: Total protein, fluid: <3 g/dL

## 2022-11-13 LAB — PHOSPHORUS: Phosphorus: 3.4 mg/dL (ref 2.5–4.6)

## 2022-11-13 LAB — POC OCCULT BLOOD, ED: Fecal Occult Bld: POSITIVE — AB

## 2022-11-13 LAB — GRAM STAIN

## 2022-11-13 LAB — GLUCOSE, PLEURAL OR PERITONEAL FLUID: Glucose, Fluid: 145 mg/dL

## 2022-11-13 LAB — PROTIME-INR
INR: 0.9 (ref 0.8–1.2)
Prothrombin Time: 12.5 seconds (ref 11.4–15.2)

## 2022-11-13 LAB — LACTATE DEHYDROGENASE, PLEURAL OR PERITONEAL FLUID: LD, Fluid: 67 U/L — ABNORMAL HIGH (ref 3–23)

## 2022-11-13 LAB — PREPARE RBC (CROSSMATCH)

## 2022-11-13 MED ORDER — FUROSEMIDE 10 MG/ML IJ SOLN
40.0000 mg | Freq: Once | INTRAMUSCULAR | Status: AC
  Administered 2022-11-13: 40 mg via INTRAVENOUS
  Filled 2022-11-13: qty 4

## 2022-11-13 MED ORDER — SODIUM CHLORIDE 0.9% IV SOLUTION: Freq: Once | INTRAVENOUS | Status: AC

## 2022-11-13 MED ORDER — LEVALBUTEROL HCL 0.63 MG/3ML IN NEBU: 0.6300 mg | INHALATION_SOLUTION | Freq: Four times a day (QID) | RESPIRATORY_TRACT | Status: DC | PRN

## 2022-11-13 MED ORDER — ACETAMINOPHEN 650 MG RE SUPP: 650.0000 mg | Freq: Four times a day (QID) | RECTAL | Status: DC | PRN

## 2022-11-13 MED ORDER — OXYCODONE HCL 5 MG PO TABS: 5.0000 mg | ORAL_TABLET | ORAL | Status: DC | PRN

## 2022-11-13 MED ORDER — FERROUS SULFATE 325 (65 FE) MG PO TABS
325.0000 mg | ORAL_TABLET | Freq: Three times a day (TID) | ORAL | Status: DC
  Administered 2022-11-13 – 2022-11-15 (×6): 325 mg via ORAL
  Filled 2022-11-13 (×6): qty 1

## 2022-11-13 MED ORDER — HEPARIN SODIUM (PORCINE) 5000 UNIT/ML IJ SOLN: 5000.0000 [IU] | Freq: Three times a day (TID) | INTRAMUSCULAR | Status: DC

## 2022-11-13 MED ORDER — TRAZODONE HCL 50 MG PO TABS
25.0000 mg | ORAL_TABLET | Freq: Every evening | ORAL | Status: DC | PRN
  Administered 2022-11-14: 25 mg via ORAL
  Filled 2022-11-13: qty 1

## 2022-11-13 MED ORDER — AMLODIPINE BESYLATE 5 MG PO TABS
5.0000 mg | ORAL_TABLET | Freq: Every day | ORAL | Status: DC
  Administered 2022-11-14: 5 mg via ORAL
  Filled 2022-11-13: qty 1

## 2022-11-13 MED ORDER — INFLUENZA VAC A&B SA ADJ QUAD 0.5 ML IM PRSY
0.5000 mL | PREFILLED_SYRINGE | INTRAMUSCULAR | Status: DC
  Filled 2022-11-13: qty 0.5

## 2022-11-13 MED ORDER — SENNOSIDES-DOCUSATE SODIUM 8.6-50 MG PO TABS: 1.0000 | ORAL_TABLET | Freq: Every evening | ORAL | Status: DC | PRN

## 2022-11-13 MED ORDER — FLEET ENEMA 7-19 GM/118ML RE ENEM: 1.0000 | ENEMA | Freq: Once | RECTAL | Status: DC | PRN

## 2022-11-13 MED ORDER — IOHEXOL 350 MG/ML SOLN
75.0000 mL | Freq: Once | INTRAVENOUS | Status: AC | PRN
  Administered 2022-11-13: 75 mL via INTRAVENOUS

## 2022-11-13 MED ORDER — SODIUM CHLORIDE 0.9 % IV SOLN: INTRAVENOUS | Status: DC

## 2022-11-13 MED ORDER — IPRATROPIUM BROMIDE 0.02 % IN SOLN: 0.5000 mg | Freq: Four times a day (QID) | RESPIRATORY_TRACT | Status: DC | PRN

## 2022-11-13 MED ORDER — DOCUSATE SODIUM 100 MG PO CAPS
200.0000 mg | ORAL_CAPSULE | Freq: Every day | ORAL | Status: DC
  Administered 2022-11-13: 200 mg via ORAL
  Filled 2022-11-13 (×3): qty 2

## 2022-11-13 MED ORDER — SODIUM CHLORIDE 0.9% FLUSH
3.0000 mL | Freq: Two times a day (BID) | INTRAVENOUS | Status: DC
  Administered 2022-11-13 – 2022-11-15 (×5): 3 mL via INTRAVENOUS

## 2022-11-13 MED ORDER — FUROSEMIDE 10 MG/ML IJ SOLN
40.0000 mg | Freq: Two times a day (BID) | INTRAMUSCULAR | Status: DC
  Administered 2022-11-13 – 2022-11-15 (×4): 40 mg via INTRAVENOUS
  Filled 2022-11-13 (×5): qty 4

## 2022-11-13 MED ORDER — BISACODYL 5 MG PO TBEC: 5.0000 mg | DELAYED_RELEASE_TABLET | Freq: Every day | ORAL | Status: DC | PRN

## 2022-11-13 MED ORDER — SODIUM CHLORIDE 0.9% IV SOLUTION: Freq: Once | INTRAVENOUS | Status: DC

## 2022-11-13 MED ORDER — PANTOPRAZOLE SODIUM 40 MG PO TBEC: 40.0000 mg | DELAYED_RELEASE_TABLET | Freq: Every day | ORAL | Status: DC

## 2022-11-13 MED ORDER — HYDRALAZINE HCL 20 MG/ML IJ SOLN: 10.0000 mg | INTRAMUSCULAR | Status: DC | PRN

## 2022-11-13 MED ORDER — ONDANSETRON HCL 4 MG/2ML IJ SOLN: 4.0000 mg | Freq: Four times a day (QID) | INTRAMUSCULAR | Status: DC | PRN

## 2022-11-13 MED ORDER — ACETAMINOPHEN 325 MG PO TABS: 650.0000 mg | ORAL_TABLET | Freq: Four times a day (QID) | ORAL | Status: DC | PRN

## 2022-11-13 MED ORDER — GLIMEPIRIDE 2 MG PO TABS
1.0000 mg | ORAL_TABLET | Freq: Two times a day (BID) | ORAL | Status: DC
  Administered 2022-11-14 – 2022-11-15 (×3): 1 mg via ORAL
  Filled 2022-11-13 (×4): qty 1

## 2022-11-13 MED ORDER — DOCUSATE SODIUM 100 MG PO CAPS: 100.0000 mg | ORAL_CAPSULE | Freq: Every day | ORAL | Status: DC

## 2022-11-13 MED ORDER — ONDANSETRON HCL 4 MG PO TABS: 4.0000 mg | ORAL_TABLET | Freq: Four times a day (QID) | ORAL | Status: DC | PRN

## 2022-11-13 MED ORDER — HYDROMORPHONE HCL 1 MG/ML IJ SOLN: 0.5000 mg | INTRAMUSCULAR | Status: DC | PRN

## 2022-11-13 MED ORDER — FERROUS SULFATE 325 (65 FE) MG PO TABS: 325.0000 mg | ORAL_TABLET | Freq: Every day | ORAL | Status: DC

## 2022-11-13 MED ORDER — PANTOPRAZOLE SODIUM 40 MG PO TBEC
40.0000 mg | DELAYED_RELEASE_TABLET | Freq: Two times a day (BID) | ORAL | Status: DC
  Administered 2022-11-13 – 2022-11-15 (×4): 40 mg via ORAL
  Filled 2022-11-13 (×4): qty 1

## 2022-11-13 NOTE — ED Notes (Signed)
Pt transferred to US. 

## 2022-11-13 NOTE — Procedures (Signed)
  Bilateral pleural effusions Rt greater than Left  US guided Right thoracentesis 900 cc amber fluid obtained Sent for labs per MD  Tolerated well  CXR:  No PTX per Dr Thornton Papas

## 2022-11-13 NOTE — Telephone Encounter (Signed)
error 

## 2022-11-13 NOTE — Progress Notes (Signed)
Patient seen by me as outpatient virtually on 1/29 with plans to repeat iron studies, start PO iron and schedule Givens capsule study for ongoing IDA as EGD and Colonoscopy have been unrevealing thus far. As he is not having overt GI bleeding, will order inpatient iron studies, start PPI BID, should transfuse as needed for hgb <7 and will plan for capsule placement on Friday morning as long as he is stable.   GI will follow peripherally for now. Case discussed with Dr. Gala Romney who is in agreement with the plan as outlined above.

## 2022-11-13 NOTE — H&P (Signed)
History and Physical   Patient: Bill Salinas                            PCP: Scherrie Bateman                    DOB: 24-Mar-1951            DOA: 11/13/2022 GXQ:[id number]             DOS: 11/13/2022, 3:03 PM  Jake Samples, PA-C  Patient coming from:   HOME  I have personally reviewed patient's medical records, in electronic medical records, including:  [facility], and care everywhere.    Chief Complaint:   Chief Complaint  Patient presents with   Shortness of Breath    History of present illness:    OLLIVANDER SEE  is a 72 year old male with h/o HTN, HLD,   prostate cancer (S/P prostatectomy), OSA, CKD stage III, chronic respiratory failure on 4 L, tobacco abuse in remission presenting with progressive shortness of breath over past 24 hours. Baseline O2 demand 4 L, up to 6 at home and now up to 10 L of oxygen.  Reporting similar presentation with hypoxia and anemia in the past 2 hospitalization October 2023 and October 15, 2022... He was subsequently discharged on 4 L of oxygen In October he was transfused with 1 unit of PRBC, back in January he was transfused with 2 units of PRBC.   GI workup was deferred to the outpatient setting.  Colonoscopy was planned given his history of iron deficiency anemia.  Subsequently was canceled. He has not had any follow-up since.   He presents again with generalized weakness and dyspnea.  He denies any hematochezia, melena, hematemesis, hematuria.  He has not had any fevers, chills, chest pain, nausea, vomiting, diarrhea, abdominal pain.  He denies any NSAIDs.    ED evaluation: Blood pressure (!) 147/60, pulse 95, Temp. 98.1 F (36.7 C), RR 15, SpO2 100 % on 10 L of oxygen HFNC   WBC 7.9, hemoglobin 7.6, hematocrit 28.5, calcium 8.6, troponin 19, glucose 183, BNP 183.0  Hemoccult positive X 2     Respiratory panel : COVID, RSV, Flu were all negative CTA: Negative for pulmonary embolism, pleural effusion, fluid  overload.   He was started on IV Lasix     Assessment and Plan: Acute on chronic respiratory failure with hypoxia -due to fluid overload -Check COVID, RSV, Flu--all negative -CTA: Bilateral pleural effusion, congestion,, negative for pulmonary embolism -at [facility] line O2  4L a -Now on 10 L HFNC>>    Symptomatic anemia -Patient has chronic blood loss -Hemoglobin 8.4 at the time of his discharge on 10/15/2022 - Hemoccult positive X 2    Latest Ref Rng & Units 11/13/2022   11:36 AM 10/25/2022    2:26 PM 10/15/2022    4:33 AM  CBC  WBC 4.0 - 10.5 K/uL 7.9  7.4  8.8   Hemoglobin 13.0 - 17.0 g/dL 7.6  8.2  8.4   Hematocrit 39.0 - 52.0 % 28.5  28.0  29.3   Platelets 150 - 400 K/uL 249  413  295      -ferritin 8, iron sat 23% -GI consult appreciated  -Was transfused with 2U PRBC on last admission anticipating another 2 units today  -pantoprazole I -10/15/22 EGD--normal -10/15/22 colonoscopy--8 polyps removed. +diverticulosis    Acute HFpEF -started on IV lasix -  05/15/22 Echo--EF 60-65%, G1DD, no WMA, normal RVF -10/14/22 ReDS = 36 -d/c home with lasix 40 mg po daily   CKD stage IIIa -Baseline creatinine 1.1-1.4 -monitor with diuresis -serum creatinine 1.103 >> 0.95   Pulmonary nodular infiltrates -12/31 CT chest--RUL mass like consolidations -CTA: No mention of nodules,Bilateral pleural effusion, congestion,, negative for pulmonary embolism    Uncontrolled diabetes mellitus type 2 with hyperglycemia -Resume  Amaryl-- -05/14/2022 A1c 7.7 -12/30 Repeat A1c--7.3 - SSI    Iron deficiency Anemia -iron sat 23 -ferritin 8 -Cont.  ferrous sulfate   Essential hypertension -restarted amlodipine 5 mg daily   COPD -stable -he is not on any maintenance medications at home -Quit smoking in September 2023 after 50 pack years   History of stroke -Intolerant to statin -On Aspirin    GERD -Continue pantoprazole   Hyperlipidemia -Intolerant to statin   Class II  obesity -Lifestyle modification Body mass index is 34.36 kg/m.    OSA Not on CPAP secondary to intolerance.        Patient Denies having: Fever, Chills, Cough,, Chest Pain, Abd pain, N/V/D, headache, dizziness, lightheadedness,  Dysuria, Joint pain, rash, open wounds     Review of Systems: As per HPI, otherwise 10 point review of systems were negative.   ----------------------------------------------------------------------------------------------------------------------  Allergies  Allergen Reactions   Lidocaine Hives   Benzocaine Other (See Comments)    Blisters    Other     Novocaine - blisters in mouth    Home MEDs:  Prior to Admission medications   Medication Sig Start Date End Date Taking? Authorizing Provider  amLODipine (NORVASC) 5 MG tablet Take 1 tablet (5 mg total) by mouth daily. Patient taking differently: Take 10 mg by mouth daily. 10/16/22  Yes Tat, Shanon Brow, MD  docusate sodium (COLACE) 100 MG capsule Take 100 mg by mouth daily.   Yes [provider]  ferrous sulfate 325 (65 FE) MG tablet Take 1 tablet (325 mg total) by mouth daily with breakfast. 11/11/22  Yes Carlan, Chelsea L, NP  furosemide (LASIX) 40 MG tablet Take 1 tablet (40 mg total) by mouth daily. Patient taking differently: Take 40 mg by mouth daily. Order is for daily but states takes about every other day. Has informed his MD 10/15/22  Yes Tat, Shanon Brow, MD  glimepiride (AMARYL) 1 MG tablet Take 1 mg by mouth 2 (two) times daily. 09/18/22  Yes [provider]  pantoprazole (PROTONIX) 40 MG tablet Take 1 tablet (40 mg total) by mouth daily. 08/09/22  Yes Barton Dubois, MD  aspirin EC 81 MG tablet Take 1 tablet (81 mg total) by mouth daily. Swallow whole. Patient not taking: Reported on 11/11/2022 05/16/22   Barb Merino, MD  ipratropium-albuterol (DUONEB) 0.5-2.5 (3) MG/3ML SOLN Take 3 mLs by nebulization every 6 (six) hours as needed (SOB/wheezing.). Patient taking differently: Take 3  mLs by nebulization every 4 (four) hours as needed (SOB/wheezing.). 08/09/22 11/11/22  Barton Dubois, MD  OXYGEN Inhale 4 L into the lungs continuous.    [provider]  PRESCRIPTION MEDICATION Hormone therapy injection every 6 months through [facility] due to prostate cancer.    [provider]    PRN MEDs: acetaminophen **OR** acetaminophen, bisacodyl, hydrALAZINE, HYDROmorphone (DILAUDID) injection, ipratropium, levalbuterol, ondansetron **OR** ondansetron (ZOFRAN) IV, oxyCODONE, senna-docusate, sodium phosphate, traZODone  Past Medical History:  Diagnosis Date   Arthritis    Cancer ([place])    Prostate, has had prostate removed, has had radiation and still has cancer,  Colon polyps    Cough    GERD (gastroesophageal reflux disease)    occasional    H/O tooth extraction week of 03/15/2015    History of kidney stones    Hypercholesteremia    Hypertension    Lumbar stenosis with neurogenic claudication    Pneumonia    Pre-diabetes    Renal disorder    Sleep apnea    No cpap    Past Surgical History:  Procedure Laterality Date   APPENDECTOMY     BACK SURGERY     CHOLECYSTECTOMY     COLONOSCOPY  02/04/2012   Procedure: COLONOSCOPY;  Surgeon: Jamesetta So, MD;  [facility];  Service: Gastroenterology;  Laterality: N/A;   COLONOSCOPY N/A 09/22/2018   Procedure: COLONOSCOPY;  Surgeon: Aviva Signs, MD;  [facility];  Service: Gastroenterology;  Laterality: N/A;   COLONOSCOPY W/ POLYPECTOMY     COLONOSCOPY WITH PROPOFOL N/A 10/15/2022   Procedure: COLONOSCOPY WITH PROPOFOL;  Surgeon: Eloise Harman, DO;  [facility];  Service: Endoscopy;  Laterality: N/A;   ESOPHAGOGASTRODUODENOSCOPY (EGD) WITH PROPOFOL N/A 10/15/2022   Procedure: ESOPHAGOGASTRODUODENOSCOPY (EGD) WITH PROPOFOL;  Surgeon: Eloise Harman, DO;  [facility];  Service: Endoscopy;  Laterality: N/A;   HERNIA REPAIR     Bilateral inguinal hernia  repair X 4   hydrocelectomy     left knee arthroscopy     LITHOTRIPSY     LYMPHADENECTOMY Bilateral 03/22/2015   Procedure: PELVIC LYMPHADENECTOMY;  Surgeon: Alexis Frock, MD;  Location: [facility];  Service: Urology;  Laterality: Bilateral;   NM MYOVIEW LTD  2011   POLYPECTOMY  09/22/2018   Procedure: POLYPECTOMY;  Surgeon: Aviva Signs, MD;  [facility];  Service: Gastroenterology;;   POLYPECTOMY  10/15/2022   Procedure: POLYPECTOMY;  Surgeon: Eloise Harman, DO;  [facility];  Service: Endoscopy;;   ROBOT ASSISTED LAPAROSCOPIC RADICAL PROSTATECTOMY N/A 03/22/2015   Procedure: ROBOTIC ASSISTED LAPAROSCOPIC RADICAL PROSTATECTOMY WITH INDOCYANINE GREEN DYE INJECTION;  Surgeon: Alexis Frock, MD;  Location: [facility];  Service: Urology;  Laterality: N/A;   ROBOTIC ASSISTED LAPAROSCOPIC LYSIS OF ADHESION  03/22/2015   Procedure: ROBOTIC ASSISTED LAPAROSCOPIC LYSIS OF ADHESION;  Surgeon: Alexis Frock, MD;  Location: [facility];  Service: Urology;;   SHOULDER SURGERY Right    VASECTOMY       reports that he has quit smoking. His smoking use included cigarettes. He has a 55.00 pack-year smoking history. He has been exposed to tobacco smoke. He has never used smokeless tobacco. He reports that he does not currently use alcohol after a past usage of about 1.0 standard drink of alcohol per week. He reports that he does not use drugs.   Family History  Problem Relation Age of Onset   Heart failure Father    Stroke Father    Colon cancer Neg Hx     Physical Exam:   Vitals:   11/13/22 1133 11/13/22 1138 11/13/22 1200 11/13/22 1330  BP: (!) 159/66  (!) 155/90 (!) 147/60  Pulse: (!) 106  (!) 101 95  Resp: (!) '22  19 15  '$ Temp: 98.1 F (36.7 C)     TempSrc: Oral     SpO2: 93% 95% 100% 100%  Weight:      Height:       Constitutional: NAD, moderate acute distress with shortness of breath Eyes: PERRL, lids and conjunctivae normal ENMT: Mucous membranes are moist.  Posterior pharynx clear  of any exudate or lesions.Normal dentition.  Neck: normal, supple, no masses, no thyromegaly Respiratory: Shortness of breath, diffuse rhonchi, bilateral lower lobe crackles.  Increase respiratory effort.  Some use of accessory muscle   Cardiovascular: Regular rate and rhythm, no murmurs / rubs / gallops. No extremity edema. 2+ pedal pulses. No carotid bruits.  Abdomen: no tenderness, no masses palpated. No hepatosplenomegaly. Bowel sounds positive.  Musculoskeletal: no clubbing / cyanosis. No joint deformity upper and lower extremities. Good ROM, no contractures. Normal muscle tone.  Neurologic: CN II-XII grossly intact. Sensation intact, DTR normal. Strength 5/5 in all 4.  Psychiatric: Normal judgment and insight. Alert and oriented x 3. Normal mood.  Skin: no rashes, lesions, ulcers. No induration Decubitus/ulcers:  Wounds: per nursing documentation      Labs on admission:    I have personally reviewed following labs and imaging studies  CBC: Recent Labs  Lab 11/13/22 1136  WBC 7.9  NEUTROABS 6.6  HGB 7.6*  HCT 28.5*  MCV 82.8  PLT 742   Basic Metabolic Panel: Recent Labs  Lab 11/13/22 1136  NA 138  K 4.3  CL 90*  CO2 40*  GLUCOSE 183*  BUN 10  CREATININE 0.95  CALCIUM 8.6*   GFR: Estimated Creatinine Clearance: 82.7 mL/min (by C-G formula based on SCr of 0.95 mg/dL).  Urine analysis:    Component Value Date/Time   COLORURINE YELLOW 08/08/2022 1922   APPEARANCEUR CLEAR 08/08/2022 1922   LABSPEC 1.039 (H) 08/08/2022 1922   PHURINE 6.0 08/08/2022 1922   GLUCOSEU NEGATIVE 08/08/2022 [place] NEGATIVE 08/08/2022 [place] NEGATIVE 08/08/2022 [place] NEGATIVE 08/08/2022 [place] NEGATIVE 08/08/2022 1922   NITRITE NEGATIVE 08/08/2022 1922   LEUKOCYTESUR NEGATIVE 08/08/2022 1922    Last A1C:  Lab Results  Component Value Date   HGBA1C 7.2 (H) 10/12/2022     Radiologic Exams on Admission:   CT  Angio Chest PE W and/or Wo Contrast  Result Date: 11/13/2022 CLINICAL DATA:  Shortness of breath. EXAM: CT ANGIOGRAPHY CHEST WITH CONTRAST TECHNIQUE: Multidetector CT imaging of the chest was performed using the standard protocol during bolus administration of intravenous contrast. Multiplanar CT image reconstructions and MIPs were obtained to evaluate the vascular anatomy. RADIATION DOSE REDUCTION: This exam was performed according to the departmental dose-optimization program which includes automated exposure control, adjustment of the mA and/or kV according to patient size and/or use of iterative reconstruction technique. CONTRAST:  33m OMNIPAQUE IOHEXOL 350 MG/ML SOLN COMPARISON:  CT of the chest on 10/13/2022 and CTA of the chest on 08/08/2022 FINDINGS: Cardiovascular: Adequate opacification of pulmonary arteries to the subsegmental level. No evidence of pulmonary embolism. Dilated central pulmonary arteries with the main pulmonary artery measuring 3.2 cm. Mild cardiac enlargement. Atherosclerosis of thoracic aorta without aneurysm. Calcified coronary artery plaque present. Calcified aortic valve. No pericardial fluid identified. Reflux of contrast into the intrahepatic IVC and hepatic veins is consistent with some degree right heart failure/elevated right heart pressures. Mediastinum/Nodes: No enlarged mediastinal, hilar, or axillary lymph nodes. Thyroid gland, trachea, and esophagus demonstrate no significant findings. Lungs/Pleura: Right lung pneumonia seen previously has resolved. There is chronic atelectasis in the lingula. Bilateral pleural effusions present, right greater than left with moderate volume on the right and small volume on the left. Lungs demonstrate no overt pulmonary edema. There is atelectasis at the right base likely reflecting compressive atelectasis from pleural fluid. No pneumothorax. Upper Abdomen: Stable evidence of diffuse hepatic steatosis and  probable early cirrhotic morphology  of the liver. Status post cholecystectomy. Musculoskeletal: Stable angioma of the L1 vertebral body. No fractures. Review of the MIP images confirms the above findings. IMPRESSION: 1. No evidence of pulmonary embolism. 2. Dilated central pulmonary arteries consistent with pulmonary hypertension. 3. Bilateral pleural effusions, right greater than left with moderate volume on the right and small volume on the left. Associated compressive atelectasis at the right lung base. 4. Stable hepatic steatosis and probable early cirrhotic morphology of the liver. 5. Aortic atherosclerosis and coronary atherosclerosis. 6. Calcified aortic valve. 7. Right heart failure with reflux of contrast into the IVC and hepatic veins. Aortic Atherosclerosis (ICD10-I70.0). Electronically Signed   By: Aletta Edouard M.D.   On: 11/13/2022 13:55   DG Chest Port 1 View  Result Date: 11/13/2022 CLINICAL DATA:  Shortness of breath, chest congestion EXAM: PORTABLE CHEST 1 VIEW COMPARISON:  10/12/2022 FINDINGS: Small left pleural effusion. Bibasilar atelectasis or infiltrates. Heart is normal size. Mediastinal contours within normal limits. No acute bony abnormality. IMPRESSION: Small left pleural effusion.  Bibasilar atelectasis or infiltrates. Electronically Signed   By: Rolm Baptise M.D.   On: 11/13/2022 12:07    EKG:   Independently reviewed.  Orders placed or performed during the hospital encounter of 11/13/22   ED EKG   ED EKG   EKG 12-Lead   EKG 12-Lead   EKG 12-Lead   ---------------------------------------------------------------------------------------------------------------------------------------    Assessment / Plan:   Principal Problem:   Acute on chronic respiratory failure with hypoxia (HCC) Active Problems:   Acute heart failure with preserved ejection fraction (HFpEF) (HCC)   Prostate cancer (HCC)   Lumbar stenosis with neurogenic claudication   Rectal bleeding   Stroke (cerebrum) (HCC)   COPD with  acute exacerbation (HCC)   Essential hypertension   GERD (gastroesophageal reflux disease)   Iron deficiency anemia   Class 2 obesity        Consults called:  GI  -------------------------------------------------------------------------------------------------------------------------------------------- DVT prophylaxis:  heparin injection 5,000 Units Start: 11/13/22 2200 TED hose Start: 11/13/22 1434 SCDs Start: 11/13/22 1434   Code Status:   Code Status: Full Code   Admission status: Patient will be admitted as Observation, with a greater than 2 midnight length of stay. Level of care: Stepdown   Family Communication:  none at bedside  (The above findings and plan of care has been discussed with patient in detail, the patient expressed understanding and agreement of above plan)  --------------------------------------------------------------------------------------------------------------------------------------------------  Disposition Plan: >3 days Status is: Observation The patient remains OBS appropriate and will d/c before 2 midnights.     ----------------------------------------------------------------------------------------------------------------------------------------------------  Time spent: > than  14  Min.  Was spent seeing and evaluating the patient, reviewing all medical records, drawn plan of care.  SIGNED: Deatra Maxten, MD, FHM. FAAFP. [facility], Pager  (Please use [email or web address] to page/ or secure chat through epic) If 7PM-7AM, please contact night-coverage [email or web address],  11/13/2022, 3:03 PM

## 2022-11-13 NOTE — Progress Notes (Signed)
This nurse and Jannifer Franklin, PA spoke to pharmacist (frank) and patient about allergy listed (lidocaine). After discussion it was decided to proceed with Lidocaine 1% for thoracentesis procedure and very low risk to do so since his past reaction was blisters in the mouth days after lidocaine was given during a dental procedure.

## 2022-11-13 NOTE — Progress Notes (Signed)
Patient tolerated right sided thoracentesis procedure well today and 900 mL of serosanguinous fluid removed and sent to lab for processing. Patient verbalized understanding of post procedure instructions and taken to chest xray at completion of procedure. Chest xray read by radiologist then patient transported back to ED bed assignment at this time via stretcher with no acute distress noted.

## 2022-11-13 NOTE — ED Provider Notes (Signed)
[facility] Provider Note   CSN: [id number] Arrival date & time: 11/13/22  1120     History  Chief Complaint  Patient presents with   Shortness of Breath    Mark Cannon is a 72 y.o. male.  Pt is a 72 yo male with pmhx significant for htn, hld, gerd, artritis, osa, prostate cancer, lumbar spinal stenosis, ckd, chronic resp failure on 4L and problems with symptomatic anemia.  Pt started getting sob last night.  He was not able to sleep well.  Pulse ox at home showed O2 sat in the low 80s, so he called EMS.  When EMS arrived, his O2 sat was 87% on 6L.  Pt put on a NRB.  Pt denies f/c.  He has not had a cough.  No dark or bloody stools.  EGD- 10/15/22 Z-line regular, 38 cm from the incisors. - Normal stomach.- Normal duodenal bulb, first portion of the  duodenum and second portion of the duodenum. - No specimens collected. Colonoscopy: 10/15/22- Diverticulosis in the sigmoid colon and in the  descending colon. - One 5 mm polyp in the cecum - One 15 mm polyp in the ascending colon - Five 6 to 10 mm polyps in the descending colon and in the transverse colon  - One 12 mm polyp in the sigmoid colon   [facility] Capsule study planned.       Home Medications Prior to Admission medications   Medication Sig Start Date End Date Taking? Authorizing Provider  amLODipine (NORVASC) 5 MG tablet Take 1 tablet (5 mg total) by mouth daily. Patient taking differently: Take 10 mg by mouth daily. 10/16/22  Yes Tat, Shanon Brow, MD  docusate sodium (COLACE) 100 MG capsule Take 100 mg by mouth daily.   Yes [provider]  ferrous sulfate 325 (65 FE) MG tablet Take 1 tablet (325 mg total) by mouth daily with breakfast. 11/11/22  Yes Carlan, Chelsea L, NP  furosemide (LASIX) 40 MG tablet Take 1 tablet (40 mg total) by mouth daily. Patient taking differently: Take 40 mg by mouth daily. Order is for daily but states takes about every other day. Has informed his  MD 10/15/22  Yes Tat, Shanon Brow, MD  glimepiride (AMARYL) 1 MG tablet Take 1 mg by mouth 2 (two) times daily. 09/18/22  Yes [provider]  pantoprazole (PROTONIX) 40 MG tablet Take 1 tablet (40 mg total) by mouth daily. 08/09/22  Yes Barton Dubois, MD  aspirin EC 81 MG tablet Take 1 tablet (81 mg total) by mouth daily. Swallow whole. Patient not taking: Reported on 11/11/2022 05/16/22   Barb Merino, MD  ipratropium-albuterol (DUONEB) 0.5-2.5 (3) MG/3ML SOLN Take 3 mLs by nebulization every 6 (six) hours as needed (SOB/wheezing.). Patient taking differently: Take 3 mLs by nebulization every 4 (four) hours as needed (SOB/wheezing.). 08/09/22 11/11/22  Barton Dubois, MD  OXYGEN Inhale 4 L into the lungs continuous.    [provider]  PRESCRIPTION MEDICATION Hormone therapy injection every 6 months through [facility] due to prostate cancer.    [provider]      Allergies    Lidocaine, Benzocaine, and Other    Review of Systems   Review of Systems  Respiratory:  Positive for shortness of breath.   Neurological:  Positive for weakness.  All other systems reviewed and are negative.   Physical Exam Updated Vital Signs BP (!) 147/60   Pulse 95   Temp 98.1 F (36.7 C) (  Oral)   Resp 15   Ht '5\' 8"'$  (1.727 m)   Wt 102.5 kg   SpO2 100%   BMI 34.36 kg/m  Physical Exam Vitals and nursing note reviewed.  Constitutional:      General: He is in acute distress.     Appearance: He is well-developed. He is obese. He is ill-appearing.  HENT:     Head: Normocephalic and atraumatic.     Mouth/Throat:     Mouth: Mucous membranes are moist.     Pharynx: Oropharynx is clear.  Eyes:     Extraocular Movements: Extraocular movements intact.     Pupils: Pupils are equal, round, and reactive to light.  Cardiovascular:     Rate and Rhythm: Normal rate and regular rhythm.  Pulmonary:     Effort: Tachypnea present.     Breath sounds: Rhonchi present.  Abdominal:      General: Bowel sounds are normal.     Palpations: Abdomen is soft.  Genitourinary:    Rectum: Guaiac result positive.  Musculoskeletal:     Cervical back: Normal range of motion and neck supple.     Right lower leg: Edema present.     Left lower leg: Edema present.  Skin:    General: Skin is warm.     Capillary Refill: Capillary refill takes less than 2 seconds.  Neurological:     General: No focal deficit present.     Mental Status: He is alert and oriented to person, place, and time.  Psychiatric:        Mood and Affect: Mood normal.        Behavior: Behavior normal.     ED Results / Procedures / Treatments   Labs (all labs ordered are listed, but only abnormal results are displayed) Labs Reviewed  BASIC METABOLIC PANEL - Abnormal; Notable for the following components:      Result Value   Chloride 90 (*)    CO2 40 (*)    Glucose, Bld 183 (*)    Calcium 8.6 (*)    All other components within normal limits  BRAIN NATRIURETIC PEPTIDE - Abnormal; Notable for the following components:   B Natriuretic Peptide 183.0 (*)    All other components within normal limits  CBC WITH DIFFERENTIAL/PLATELET - Abnormal; Notable for the following components:   RBC 3.44 (*)    Hemoglobin 7.6 (*)    HCT 28.5 (*)    MCH 22.1 (*)    MCHC 26.7 (*)    RDW 16.7 (*)    Lymphs Abs 0.6 (*)    All other components within normal limits  POC OCCULT BLOOD, ED - Abnormal; Notable for the following components:   Fecal Occult Bld POSITIVE (*)    All other components within normal limits  TROPONIN I (HIGH SENSITIVITY) - Abnormal; Notable for the following components:   Troponin I (High Sensitivity) 19 (*)    All other components within normal limits  RESP PANEL BY RT-PCR (RSV, FLU A&B, COVID)  RVPGX2  EXPECTORATED SPUTUM ASSESSMENT W GRAM STAIN, RFLX TO RESP C  CBC  MAGNESIUM  PHOSPHORUS  PROTIME-INR  BLOOD GAS, ARTERIAL  TYPE AND SCREEN  PREPARE RBC (CROSSMATCH)  TROPONIN I (HIGH SENSITIVITY)     EKG EKG Interpretation  Date/Time:  Wednesday November 13 2022 11:38:54 EST Ventricular Rate:  105 PR Interval:  203 QRS Duration: 81 QT Interval:  342 QTC Calculation: 452 R Axis:   69 Text Interpretation: Sinus tachycardia with irregular rate No  significant change since last tracing Confirmed by Isla Pence [phone]) on 11/13/2022 2:15:17 PM  Radiology CT Angio Chest PE W and/or Wo Contrast  Result Date: 11/13/2022 CLINICAL DATA:  Shortness of breath. EXAM: CT ANGIOGRAPHY CHEST WITH CONTRAST TECHNIQUE: Multidetector CT imaging of the chest was performed using the standard protocol during bolus administration of intravenous contrast. Multiplanar CT image reconstructions and MIPs were obtained to evaluate the vascular anatomy. RADIATION DOSE REDUCTION: This exam was performed according to the departmental dose-optimization program which includes automated exposure control, adjustment of the mA and/or kV according to patient size and/or use of iterative reconstruction technique. CONTRAST:  37m OMNIPAQUE IOHEXOL 350 MG/ML SOLN COMPARISON:  CT of the chest on 10/13/2022 and CTA of the chest on 08/08/2022 FINDINGS: Cardiovascular: Adequate opacification of pulmonary arteries to the subsegmental level. No evidence of pulmonary embolism. Dilated central pulmonary arteries with the main pulmonary artery measuring 3.2 cm. Mild cardiac enlargement. Atherosclerosis of thoracic aorta without aneurysm. Calcified coronary artery plaque present. Calcified aortic valve. No pericardial fluid identified. Reflux of contrast into the intrahepatic IVC and hepatic veins is consistent with some degree right heart failure/elevated right heart pressures. Mediastinum/Nodes: No enlarged mediastinal, hilar, or axillary lymph nodes. Thyroid gland, trachea, and esophagus demonstrate no significant findings. Lungs/Pleura: Right lung pneumonia seen previously has resolved. There is chronic atelectasis in the lingula.  Bilateral pleural effusions present, right greater than left with moderate volume on the right and small volume on the left. Lungs demonstrate no overt pulmonary edema. There is atelectasis at the right base likely reflecting compressive atelectasis from pleural fluid. No pneumothorax. Upper Abdomen: Stable evidence of diffuse hepatic steatosis and probable early cirrhotic morphology of the liver. Status post cholecystectomy. Musculoskeletal: Stable angioma of the L1 vertebral body. No fractures. Review of the MIP images confirms the above findings. IMPRESSION: 1. No evidence of pulmonary embolism. 2. Dilated central pulmonary arteries consistent with pulmonary hypertension. 3. Bilateral pleural effusions, right greater than left with moderate volume on the right and small volume on the left. Associated compressive atelectasis at the right lung base. 4. Stable hepatic steatosis and probable early cirrhotic morphology of the liver. 5. Aortic atherosclerosis and coronary atherosclerosis. 6. Calcified aortic valve. 7. Right heart failure with reflux of contrast into the IVC and hepatic veins. Aortic Atherosclerosis (ICD10-I70.0). Electronically Signed   By: GAletta EdouardM.D.   On: 11/13/2022 13:55   DG Chest Port 1 View  Result Date: 11/13/2022 CLINICAL DATA:  Shortness of breath, chest congestion EXAM: PORTABLE CHEST 1 VIEW COMPARISON:  10/12/2022 FINDINGS: Small left pleural effusion. Bibasilar atelectasis or infiltrates. Heart is normal size. Mediastinal contours within normal limits. No acute bony abnormality. IMPRESSION: Small left pleural effusion.  Bibasilar atelectasis or infiltrates. Electronically Signed   By: KRolm BaptiseM.D.   On: 11/13/2022 12:07    Procedures Procedures    Medications Ordered in ED Medications  0.9 %  sodium chloride infusion (Manually program via Guardrails IV Fluids) (has no administration in time range)  heparin injection 5,000 Units (has no administration in time  range)  sodium chloride flush (NS) 0.9 % injection 3 mL (has no administration in time range)  sodium chloride flush (NS) 0.9 % injection 3 mL (has no administration in time range)  acetaminophen (TYLENOL) tablet 650 mg (has no administration in time range)    Or  acetaminophen (TYLENOL) suppository 650 mg (has no administration in time range)  oxyCODONE (Oxy IR/ROXICODONE) immediate release tablet 5 mg (has no administration in  time range)  HYDROmorphone (DILAUDID) injection 0.5-1 mg (has no administration in time range)  traZODone (DESYREL) tablet 25 mg (has no administration in time range)  senna-docusate (Senokot-S) tablet 1 tablet (has no administration in time range)  bisacodyl (DULCOLAX) EC tablet 5 mg (has no administration in time range)  sodium phosphate (FLEET) 7-19 GM/118ML enema 1 enema (has no administration in time range)  ondansetron (ZOFRAN) tablet 4 mg (has no administration in time range)    Or  ondansetron (ZOFRAN) injection 4 mg (has no administration in time range)  ipratropium (ATROVENT) nebulizer solution 0.5 mg (has no administration in time range)  levalbuterol (XOPENEX) nebulizer solution 0.63 mg (has no administration in time range)  hydrALAZINE (APRESOLINE) injection 10 mg (has no administration in time range)  furosemide (LASIX) injection 40 mg (has no administration in time range)  amLODipine (NORVASC) tablet 5 mg (has no administration in time range)  glimepiride (AMARYL) tablet 1 mg (has no administration in time range)  docusate sodium (COLACE) capsule 100 mg (has no administration in time range)  pantoprazole (PROTONIX) EC tablet 40 mg (has no administration in time range)  ferrous sulfate tablet 325 mg (has no administration in time range)  iohexol (OMNIPAQUE) 350 MG/ML injection 75 mL (75 mLs Intravenous Contrast Given 11/13/22 1345)  furosemide (LASIX) injection 40 mg (40 mg Intravenous Given 11/13/22 1429)    ED Course/ Medical Decision Making/ A&P                              Medical Decision Making Amount and/or Complexity of Data Reviewed Labs: ordered. Radiology: ordered.  Risk Prescription drug management. Decision regarding hospitalization.   This patient presents to the ED for concern of sob, this involves an extensive number of treatment options, and is a complaint that carries with it a high risk of complications and morbidity.  The differential diagnosis includes copd, covid/flu/rsv, pna, chf   Co morbidities that complicate the patient evaluation  tn, hld, gerd, artritis, osa, prostate cancer, lumbar spinal stenosis, ckd, chronic resp failure on 4L and problems with symptomatic anemia   Additional history obtained:  Additional history obtained from epic chart review External records from outside source obtained and reviewed including EMS report   Lab Tests:  I Ordered, and personally interpreted labs.  The pertinent results include:  cbc with hgb 7.6 (8.2 on 1/12), bmp with glucose 183, bnp 183; trop 19, covid/flu/rsv neg   Imaging Studies ordered:  I ordered imaging studies including cxr and ct chest  I independently visualized and interpreted imaging which showed  CXR: Small left pleural effusion.  Bibasilar atelectasis or infiltrates.  CT chest: . No evidence of pulmonary embolism.  2. Dilated central pulmonary arteries consistent with pulmonary  hypertension.  3. Bilateral pleural effusions, right greater than left with  moderate volume on the right and small volume on the left.  Associated compressive atelectasis at the right lung base.  4. Stable hepatic steatosis and probable early cirrhotic morphology  of the liver.  5. Aortic atherosclerosis and coronary atherosclerosis.  6. Calcified aortic valve.  7. Right heart failure with reflux of contrast into the IVC and  hepatic veins.    Aortic Atherosclerosis (ICD10-I70.0).   I agree with the radiologist interpretation   Cardiac  Monitoring:  The patient was maintained on a cardiac monitor.  I personally viewed and interpreted the cardiac monitored which showed an underlying rhythm of: st   Medicines  ordered and prescription drug management:  I ordered medication including oxygen and lasix  for sob  Reevaluation of the patient after these medicines showed that the patient improved I have reviewed the patients home medicines and have made adjustments as needed   Test Considered:  ct   Critical Interventions:  oxygen   Consultations Obtained:  I requested consultation with the hospitalist (Dr. Roger Shelter),  and discussed lab and imaging findings as well as pertinent plan - he will admit Pt d/w Dr. Gala Romney (GI) who recommends transfusion.  Keep him on oral ppi.  Clears and plan for likely capsule tomorrow.   Problem List / ED Course:  SOB:  pt is up to 10L on HFO2 and sats are in the upper 90s..  Pt does have CHF.  Pt is given lasix.   Symptomatic anemia with guaiac + stool:  1 unit prbcs ordered for transfusion   Reevaluation:  After the interventions noted above, I reevaluated the patient and found that they have :improved   Social Determinants of Health:  Lives at home   Dispostion:  After consideration of the diagnostic results and the patients response to treatment, I feel that the patent would benefit from admission.    CRITICAL CARE Performed by: Isla Pence   Total critical care time: 45 minutes  Critical care time was exclusive of separately billable procedures and treating other patients.  Critical care was necessary to treat or prevent imminent or life-threatening deterioration.  Critical care was time spent personally by me on the following activities: development of treatment plan with patient and/or surrogate as well as nursing, discussions with consultants, evaluation of patient's response to treatment, examination of patient, obtaining history from patient or surrogate,  ordering and performing treatments and interventions, ordering and review of laboratory studies, ordering and review of radiographic studies, pulse oximetry and re-evaluation of patient's condition.         Final Clinical Impression(s) / ED Diagnoses Final diagnoses:  Acute respiratory failure with hypoxia ([facility])  Acute on chronic congestive heart failure, unspecified heart failure type ([facility])  Bilateral pleural effusion  Symptomatic anemia  Gastrointestinal hemorrhage, unspecified gastrointestinal hemorrhage type    Rx / DC Orders ED Discharge Orders     None         Isla Pence, MD 11/13/22 1449

## 2022-11-13 NOTE — ED Notes (Signed)
Spoke with ordering MD about [facility]. He stated he still wanted it. Patient ultimately refused. RN aware

## 2022-11-13 NOTE — ED Triage Notes (Signed)
EMS states pt called to c/o SOB, starting at 0200.  He wears 4L [place]; he checked his O2 at home & pulse ox showed in the 80's.  No other complaints.  EMS states pt was 87% on 6L [place], placed on nonbreather raising him 100% with easier work of breathing.

## 2022-11-13 NOTE — ED Notes (Signed)
Hospitalist at bedside 

## 2022-11-13 NOTE — Hospital Course (Addendum)
Mark Cannon  is a 72 year old male with h/o HTN, HLD,   prostate cancer (S/P prostatectomy), OSA, CKD stage III, chronic respiratory failure on 4 L, tobacco abuse in remission presenting with progressive shortness of breath over past 24 hours. Baseline O2 demand 4 L, up to 6 at home and now up to 10 L of oxygen.  Reporting similar presentation with hypoxia and anemia in the past 2 hospitalization October 2023 and October 15, 2022... He was subsequently discharged on 4 L of oxygen In October he was transfused with 1 unit of PRBC, back in January he was transfused with 2 units of PRBC.   GI workup was deferred to the outpatient setting.  Colonoscopy was planned given his history of iron deficiency anemia.  Subsequently was canceled. He has not had any follow-up since.   He presents again with generalized weakness and dyspnea.  He denies any hematochezia, melena, hematemesis, hematuria.  He has not had any fevers, chills, chest pain, nausea, vomiting, diarrhea, abdominal pain.  He denies any NSAIDs.    ED evaluation: Blood pressure (!) 147/60, pulse 95, Temp. 98.1 F (36.7 C), RR 15, SpO2 100 % on 10 L of oxygen HFNC   WBC 7.9, hemoglobin 7.6, hematocrit 28.5, calcium 8.6, troponin 19, glucose 183, BNP 183.0  Hemoccult positive X 2     Respiratory panel : COVID, RSV, Flu were all negative CTA: Negative for pulmonary embolism, pleural effusion, fluid overload.   He was started on IV Lasix     Assessment and Plan: Acute on chronic respiratory failure with hypoxia -due to fluid overload -Check COVID, RSV, Flu--all negative -CTA: Bilateral pleural effusion, congestion,, negative for pulmonary embolism -at [facility] line O2  4L a -Now on 10 L HFNC>>    Symptomatic anemia -Patient has chronic blood loss -Hemoglobin 8.4 at the time of his discharge on 10/15/2022 - Hemoccult positive X 2    Latest Ref Rng & Units 11/13/2022   11:36 AM 10/25/2022    2:26 PM 10/15/2022    4:33 AM  CBC  WBC  4.0 - 10.5 K/uL 7.9  7.4  8.8   Hemoglobin 13.0 - 17.0 g/dL 7.6  8.2  8.4   Hematocrit 39.0 - 52.0 % 28.5  28.0  29.3   Platelets 150 - 400 K/uL 249  413  295      -ferritin 8, iron sat 23% -GI consult appreciated  -Was transfused with 2U PRBC on last admission anticipating another 2 units today  -pantoprazole I -10/15/22 EGD--normal -10/15/22 colonoscopy--8 polyps removed. +diverticulosis    Acute HFpEF -started on IV lasix -05/15/22 Echo--EF 60-65%, G1DD, no WMA, normal RVF -10/14/22 ReDS = 36 -d/c home with lasix 40 mg po daily   CKD stage IIIa -Baseline creatinine 1.1-1.4 -monitor with diuresis -serum creatinine 1.103 >> 0.95   Pulmonary nodular infiltrates -12/31 CT chest--RUL mass like consolidations -CTA: No mention of nodules,Bilateral pleural effusion, congestion,, negative for pulmonary embolism    Uncontrolled diabetes mellitus type 2 with hyperglycemia -Resume  Amaryl-- -05/14/2022 A1c 7.7 -12/30 Repeat A1c--7.3 - SSI    Iron deficiency Anemia -iron sat 23 -ferritin 8 -Cont.  ferrous sulfate   Essential hypertension -restarted amlodipine 5 mg daily   COPD -stable -he is not on any maintenance medications at home -Quit smoking in September 2023 after 50 pack years   History of stroke -Intolerant to statin -On Aspirin    GERD -Continue pantoprazole   Hyperlipidemia -Intolerant to statin   Class II  obesity -Lifestyle modification Body mass index is 34.36 kg/m.    OSA Not on CPAP secondary to intolerance.

## 2022-11-13 NOTE — ED Notes (Signed)
Patient transported to CT 

## 2022-11-14 ENCOUNTER — Encounter (HOSPITAL_COMMUNITY): Payer: Self-pay | Admitting: Family Medicine

## 2022-11-14 ENCOUNTER — Encounter (HOSPITAL_COMMUNITY)
Admission: EM | Disposition: A | Discharge status: Home Health | Payer: Self-pay | Source: Home / Self Care | Attending: Family Medicine

## 2022-11-14 DIAGNOSIS — J9600 Acute respiratory failure, unspecified whether with hypoxia or hypercapnia: Secondary | ICD-10-CM | POA: Diagnosis present

## 2022-11-14 DIAGNOSIS — I130 Hypertensive heart and chronic kidney disease with heart failure and stage 1 through stage 4 chronic kidney disease, or unspecified chronic kidney disease: Secondary | ICD-10-CM | POA: Diagnosis present

## 2022-11-14 DIAGNOSIS — N1831 Chronic kidney disease, stage 3a: Secondary | ICD-10-CM | POA: Diagnosis present

## 2022-11-14 DIAGNOSIS — J90 Pleural effusion, not elsewhere classified: Secondary | ICD-10-CM | POA: Diagnosis not present

## 2022-11-14 DIAGNOSIS — D500 Iron deficiency anemia secondary to blood loss (chronic): Secondary | ICD-10-CM

## 2022-11-14 DIAGNOSIS — E7800 Pure hypercholesterolemia, unspecified: Secondary | ICD-10-CM | POA: Diagnosis present

## 2022-11-14 DIAGNOSIS — J9620 Acute and chronic respiratory failure, unspecified whether with hypoxia or hypercapnia: Secondary | ICD-10-CM | POA: Diagnosis present

## 2022-11-14 DIAGNOSIS — Z1152 Encounter for screening for COVID-19: Secondary | ICD-10-CM | POA: Diagnosis not present

## 2022-11-14 DIAGNOSIS — K219 Gastro-esophageal reflux disease without esophagitis: Secondary | ICD-10-CM | POA: Diagnosis present

## 2022-11-14 DIAGNOSIS — K921 Melena: Secondary | ICD-10-CM | POA: Diagnosis present

## 2022-11-14 DIAGNOSIS — K625 Hemorrhage of anus and rectum: Secondary | ICD-10-CM | POA: Diagnosis not present

## 2022-11-14 DIAGNOSIS — K5521 Angiodysplasia of colon with hemorrhage: Secondary | ICD-10-CM | POA: Diagnosis present

## 2022-11-14 DIAGNOSIS — D509 Iron deficiency anemia, unspecified: Secondary | ICD-10-CM | POA: Diagnosis present

## 2022-11-14 DIAGNOSIS — E1165 Type 2 diabetes mellitus with hyperglycemia: Secondary | ICD-10-CM | POA: Diagnosis present

## 2022-11-14 DIAGNOSIS — Z7984 Long term (current) use of oral hypoglycemic drugs: Secondary | ICD-10-CM | POA: Diagnosis not present

## 2022-11-14 DIAGNOSIS — M48062 Spinal stenosis, lumbar region with neurogenic claudication: Secondary | ICD-10-CM | POA: Diagnosis present

## 2022-11-14 DIAGNOSIS — Z923 Personal history of irradiation: Secondary | ICD-10-CM | POA: Diagnosis not present

## 2022-11-14 DIAGNOSIS — K7460 Unspecified cirrhosis of liver: Secondary | ICD-10-CM | POA: Diagnosis present

## 2022-11-14 DIAGNOSIS — Z9981 Dependence on supplemental oxygen: Secondary | ICD-10-CM | POA: Diagnosis not present

## 2022-11-14 DIAGNOSIS — Z6834 Body mass index (BMI) 34.0-34.9, adult: Secondary | ICD-10-CM | POA: Diagnosis not present

## 2022-11-14 DIAGNOSIS — J9621 Acute and chronic respiratory failure with hypoxia: Secondary | ICD-10-CM | POA: Diagnosis present

## 2022-11-14 DIAGNOSIS — M1990 Unspecified osteoarthritis, unspecified site: Secondary | ICD-10-CM | POA: Diagnosis present

## 2022-11-14 DIAGNOSIS — I5031 Acute diastolic (congestive) heart failure: Secondary | ICD-10-CM

## 2022-11-14 DIAGNOSIS — J9622 Acute and chronic respiratory failure with hypercapnia: Secondary | ICD-10-CM | POA: Diagnosis present

## 2022-11-14 DIAGNOSIS — J449 Chronic obstructive pulmonary disease, unspecified: Secondary | ICD-10-CM | POA: Diagnosis present

## 2022-11-14 DIAGNOSIS — D649 Anemia, unspecified: Secondary | ICD-10-CM | POA: Diagnosis not present

## 2022-11-14 DIAGNOSIS — G4733 Obstructive sleep apnea (adult) (pediatric): Secondary | ICD-10-CM | POA: Diagnosis present

## 2022-11-14 DIAGNOSIS — E669 Obesity, unspecified: Secondary | ICD-10-CM | POA: Diagnosis present

## 2022-11-14 DIAGNOSIS — Z23 Encounter for immunization: Secondary | ICD-10-CM | POA: Diagnosis not present

## 2022-11-14 DIAGNOSIS — J918 Pleural effusion in other conditions classified elsewhere: Secondary | ICD-10-CM | POA: Diagnosis present

## 2022-11-14 DIAGNOSIS — M48061 Spinal stenosis, lumbar region without neurogenic claudication: Secondary | ICD-10-CM | POA: Diagnosis present

## 2022-11-14 HISTORY — PX: GIVENS CAPSULE STUDY: SHX5432

## 2022-11-14 LAB — BASIC METABOLIC PANEL
Anion gap: 9 (ref 5–15)
BUN: 9 mg/dL (ref 8–23)
CO2: 43 mmol/L — ABNORMAL HIGH (ref 22–32)
Calcium: 8.5 mg/dL — ABNORMAL LOW (ref 8.9–10.3)
Chloride: 87 mmol/L — ABNORMAL LOW (ref 98–111)
Creatinine, Ser: 1.05 mg/dL (ref 0.61–1.24)
GFR, Estimated: >60 mL/min (ref >60)
Glucose, Bld: 96 mg/dL (ref 70–99)
Potassium: 3.7 mmol/L (ref 3.5–5.1)
Sodium: 139 mmol/L (ref 135–145)

## 2022-11-14 LAB — CBC
HCT: 33.2 % — ABNORMAL LOW (ref 39.0–52.0)
Hemoglobin: 9.5 g/dL — ABNORMAL LOW (ref 13.0–17.0)
MCH: 23.2 pg — ABNORMAL LOW (ref 26.0–34.0)
MCHC: 28.6 g/dL — ABNORMAL LOW (ref 30.0–36.0)
MCV: 81 fL (ref 80.0–100.0)
Platelets: 237 10*3/uL (ref 150–400)
RBC: 4.1 MIL/uL — ABNORMAL LOW (ref 4.22–5.81)
RDW: 16.8 % — ABNORMAL HIGH (ref 11.5–15.5)
WBC: 7.7 10*3/uL (ref 4.0–10.5)
nRBC: 0 % (ref 0.0–0.2)

## 2022-11-14 LAB — PROTIME-INR
INR: 1 (ref 0.8–1.2)
Prothrombin Time: 13.1 seconds (ref 11.4–15.2)

## 2022-11-14 LAB — GLUCOSE, CAPILLARY: Glucose-Capillary: 97 mg/dL (ref 70–99)

## 2022-11-14 LAB — BRAIN NATRIURETIC PEPTIDE: B Natriuretic Peptide: 255 pg/mL — ABNORMAL HIGH (ref 0.0–100.0)

## 2022-11-14 LAB — APTT: aPTT: 29 seconds (ref 24–36)

## 2022-11-14 LAB — MRSA NEXT GEN BY PCR, NASAL: MRSA by PCR Next Gen: NOT DETECTED

## 2022-11-14 SURGERY — IMAGING PROCEDURE, GI TRACT, INTRALUMINAL, VIA CAPSULE
Anesthesia: Choice

## 2022-11-14 MED ORDER — LIVING BETTER WITH HEART FAILURE BOOK: Freq: Once | Status: AC

## 2022-11-14 MED ORDER — BISOPROLOL FUMARATE 5 MG PO TABS
5.0000 mg | ORAL_TABLET | Freq: Every day | ORAL | Status: DC
  Administered 2022-11-14 – 2022-11-15 (×2): 5 mg via ORAL
  Filled 2022-11-14 (×2): qty 1

## 2022-11-14 MED ORDER — SPIRONOLACTONE 25 MG PO TABS
25.0000 mg | ORAL_TABLET | Freq: Two times a day (BID) | ORAL | Status: DC
  Administered 2022-11-14 – 2022-11-15 (×3): 25 mg via ORAL
  Filled 2022-11-14 (×3): qty 1

## 2022-11-14 MED ORDER — CHLORHEXIDINE GLUCONATE CLOTH 2 % EX PADS
6.0000 | MEDICATED_PAD | Freq: Every day | CUTANEOUS | Status: DC
  Administered 2022-11-14 – 2022-11-15 (×2): 6 via TOPICAL

## 2022-11-14 NOTE — Progress Notes (Addendum)
Gastroenterology Progress Note   Referring Provider: No ref. provider found Primary Care Physician:  Scherrie Bateman Primary Gastroenterologist:  Dr. Jenetta Downer  Patient ID: Mark Cannon; [id number]; 09-18-1951   Subjective:    Breathing better. Had thoracentesis yesterday. Pulmonology consult pending. Patient is hungry. He has been NPO anticipating capsule study today, our original plans for for Friday once respiratory status improved. Patient/RN state that he may go home today, pending pulmonology consult. He denies melena, brbpr.  Objective:   Vital signs in last 24 hours: Temp:  [98 F (36.7 C)-99.6 F (37.6 C)] 98 F (36.7 C) (02/01 0758) Pulse Rate:  [2-130] 2 (02/01 0410) Resp:  [15-25] 24 (02/01 0410) BP: (114-191)/(39-163) 136/74 (02/01 0410) SpO2:  [79 %-100 %] 95 % (02/01 0410) Weight:  [101.1 kg-102.5 kg] 101.6 kg (02/01 0500) Last BM Date : 11/13/22 General:   Alert,  Well-developed, well-nourished, pleasant and cooperative in NAD Head:  Normocephalic and atraumatic. Eyes:  Sclera clear, no icterus.   Abdomen:  Soft, nontender and nondistended.   Normal bowel sounds, without guarding, and without rebound.   Extremities:  trace bilateral edema. Neurologic:  Alert and  oriented x4;  grossly normal neurologically. Skin:  Intact without significant lesions or rashes. Psych:  Alert and cooperative. Normal mood and affect.  Intake/Output from previous day: 01/31 0701 - 02/01 0700 In: 689 [I.V.:20; Blood:669] Out: 1500 [Urine:1500] Intake/Output this shift: No intake/output data recorded.  Lab Results: CBC Recent Labs    11/13/22 1136 11/14/22 0706  WBC 7.9 7.7  HGB 7.6* 9.5*  HCT 28.5* 33.2*  MCV 82.8 81.0  PLT 249 237   BMET Recent Labs    11/13/22 1136 11/14/22 0706  NA 138 139  K 4.3 3.7  CL 90* 87*  CO2 40* 43*  GLUCOSE 183* 96  BUN 10 9  CREATININE 0.95 1.05  CALCIUM 8.6* 8.5*   LFTs No results for input(s): "BILITOT",  "BILIDIR", "IBILI", "ALKPHOS", "AST", "ALT", "PROT", "ALBUMIN" in the last 72 hours. No results for input(s): "LIPASE" in the last 72 hours. PT/INR Recent Labs    11/13/22 1136 11/14/22 0706  LABPROT 12.5 13.1  INR 0.9 1.0         Imaging Studies: DG Chest 1 View  Result Date: 11/13/2022 CLINICAL DATA:  RIGHT pleural effusion post thoracentesis EXAM: CHEST  1 VIEW COMPARISON:  Repeat exam 1544 hours compared to 1159 hours FINDINGS: Enlargement of cardiac silhouette. Mediastinal contours and pulmonary vascularity normal. Decreased RIGHT pleural effusion and basilar atelectasis. No pneumothorax following thoracentesis. Persistent small LEFT pleural effusion and mild LEFT basilar atelectasis. Prior cervical spine fusion. IMPRESSION: No pneumothorax following RIGHT thoracentesis. Electronically Signed   By: Lavonia Dana M.D.   On: 11/13/2022 16:07   US THORACENTESIS ASP PLEURAL SPACE W/IMG GUIDE  Result Date: 11/13/2022 INDICATION: Bilateral pleural effusions Right greater than left EXAM: ULTRASOUND GUIDED Right THORACENTESIS MEDICATIONS: 10 cc 1% lidocaine. COMPLICATIONS: None immediate. PROCEDURE: An ultrasound guided thoracentesis was thoroughly discussed with the patient and questions answered. The benefits, risks, alternatives and complications were also discussed. The patient understands and wishes to proceed with the procedure. Written consent was obtained. Ultrasound was performed to localize and mark an adequate pocket of fluid in the right chest. The area was then prepped and draped in the normal sterile fashion. 1% Lidocaine was used for local anesthesia. Under ultrasound guidance a 7 cm Yueh catheter was introduced. Thoracentesis was performed. The catheter was removed and a dressing applied. FINDINGS:  A total of approximately 900 cc of amber fluid was removed. Samples were sent to the laboratory as requested by the clinical team. IMPRESSION: Successful ultrasound guided right  thoracentesis yielding 900 cc of pleural fluid. Post procedure CXR: NO PTX per Dr Thornton Papas Read by Lavonia Drafts [facility] Electronically Signed   By: Lavonia Dana M.D.   On: 11/13/2022 16:07   CT Angio Chest PE W and/or Wo Contrast  Result Date: 11/13/2022 CLINICAL DATA:  Shortness of breath. EXAM: CT ANGIOGRAPHY CHEST WITH CONTRAST TECHNIQUE: Multidetector CT imaging of the chest was performed using the standard protocol during bolus administration of intravenous contrast. Multiplanar CT image reconstructions and MIPs were obtained to evaluate the vascular anatomy. RADIATION DOSE REDUCTION: This exam was performed according to the departmental dose-optimization program which includes automated exposure control, adjustment of the mA and/or kV according to patient size and/or use of iterative reconstruction technique. CONTRAST:  65m OMNIPAQUE IOHEXOL 350 MG/ML SOLN COMPARISON:  CT of the chest on 10/13/2022 and CTA of the chest on 08/08/2022 FINDINGS: Cardiovascular: Adequate opacification of pulmonary arteries to the subsegmental level. No evidence of pulmonary embolism. Dilated central pulmonary arteries with the main pulmonary artery measuring 3.2 cm. Mild cardiac enlargement. Atherosclerosis of thoracic aorta without aneurysm. Calcified coronary artery plaque present. Calcified aortic valve. No pericardial fluid identified. Reflux of contrast into the intrahepatic IVC and hepatic veins is consistent with some degree right heart failure/elevated right heart pressures. Mediastinum/Nodes: No enlarged mediastinal, hilar, or axillary lymph nodes. Thyroid gland, trachea, and esophagus demonstrate no significant findings. Lungs/Pleura: Right lung pneumonia seen previously has resolved. There is chronic atelectasis in the lingula. Bilateral pleural effusions present, right greater than left with moderate volume on the right and small volume on the left. Lungs demonstrate no overt pulmonary edema. There is atelectasis at  the right base likely reflecting compressive atelectasis from pleural fluid. No pneumothorax. Upper Abdomen: Stable evidence of diffuse hepatic steatosis and probable early cirrhotic morphology of the liver. Status post cholecystectomy. Musculoskeletal: Stable angioma of the L1 vertebral body. No fractures. Review of the MIP images confirms the above findings. IMPRESSION: 1. No evidence of pulmonary embolism. 2. Dilated central pulmonary arteries consistent with pulmonary hypertension. 3. Bilateral pleural effusions, right greater than left with moderate volume on the right and small volume on the left. Associated compressive atelectasis at the right lung base. 4. Stable hepatic steatosis and probable early cirrhotic morphology of the liver. 5. Aortic atherosclerosis and coronary atherosclerosis. 6. Calcified aortic valve. 7. Right heart failure with reflux of contrast into the IVC and hepatic veins. Aortic Atherosclerosis (ICD10-I70.0). Electronically Signed   By: GAletta EdouardM.D.   On: 11/13/2022 13:55   DG Chest Port 1 View  Result Date: 11/13/2022 CLINICAL DATA:  Shortness of breath, chest congestion EXAM: PORTABLE CHEST 1 VIEW COMPARISON:  10/12/2022 FINDINGS: Small left pleural effusion. Bibasilar atelectasis or infiltrates. Heart is normal size. Mediastinal contours within normal limits. No acute bony abnormality. IMPRESSION: Small left pleural effusion.  Bibasilar atelectasis or infiltrates. Electronically Signed   By: KRolm BaptiseM.D.   On: 11/13/2022 12:07  [2 weeks]  Assessment:   Mark ORNDOFFis a 72y.o. male with past medical history of GERD, prostate cancer status post radiation therapy, HLD, HTN, COPD  and OSA who was seen as outpatient this week for follow up of IDA. Presented for admission yesterday due to worsening shortness of breath.   IDA/heme positive stool: EGD/colonoscopy recently without explanation for IDA. On  1/12 his hb was 8.2. when he presented yesterday his hgb was  7.6. he received two units of prbcs, with Hgb today of 9.5. Had plans for outpatient givens but with recent needs for 2 units of prbcs, plans for inpatient completion.    Cirrhosis: current imaging this admission with probable early cirrhotic morphology of the liver. CT A/P 12/2021 with hepatic steatosis, findings s/o cirrhosis. Normal spleen. No evidence of portal hypertension on recent EGD.     Plan:   Capsule endoscopy today. He swallowed capsule at 11:11. He will be NPO for two hours, start clear liquids at 13:11 and then remain on clear liquids for remainder of the study (19:11). If patient is discharged home today, the capsule equipment can be returned to endoscopy tomorrow.   Cirrhosis care as outpatient.    LOS: 0 days   Laureen Ochs. Bernarda Caffey [facility] [phone] 2/1/20248:35 AM

## 2022-11-14 NOTE — Progress Notes (Signed)
  Transition of Care [facility]) Screening Note   Patient Details  Name: Mark Cannon Date of Birth: [birth year]   [facility] of [facility]) CM/SW Contact:    Ihor Gully, LCSW Phone Number: 11/14/2022, 2:51 PM    Transition of Care Department [facility]) has reviewed patient and no TOC needs have been identified at this time. We will continue to monitor patient advancement through interdisciplinary progression rounds. If new patient transition needs arise, please place a TOC consult.   LIVING WITH HEART FAILURE BOOK ORDERED PLACED FOR PATIENT.

## 2022-11-14 NOTE — Evaluation (Signed)
Occupational Therapy Evaluation Patient Details Name: Mark Cannon MRN: [id number] DOB: 22-Sep-1951 Today's Date: 11/14/2022   History of Present Illness Mark Cannon  is a 72 year old male with h/o HTN, HLD,   prostate cancer (S/P prostatectomy), OSA, CKD stage III, chronic respiratory failure on 4 L, tobacco abuse in remission presenting with progressive shortness of breath over past 24 hours.  Baseline O2 demand 4 L, up to 6 at home and now up to 10 L of oxygen.     Reporting similar presentation with hypoxia and anemia in the past 2 hospitalization October 2023 and October 15, 2022... He was subsequently discharged on 4 L of oxygen  In October he was transfused with 1 unit of PRBC, back in January he was transfused with 2 units of PRBC.      GI workup was deferred to the outpatient setting.  Colonoscopy was planned given his history of iron deficiency anemia.  Subsequently was canceled.  He has not had any follow-up since.      He presents again with generalized weakness and dyspnea.  He denies any hematochezia, melena, hematemesis, hematuria.  He has not had any fevers, chills, chest pain, nausea, vomiting, diarrhea, abdominal pain.  He denies any NSAIDs.   Clinical Impression   Pt agreeable to OT session today, reports he is feeling much better but still feels weak. At baseline pt with 4L O2 and limited mobility and activity due to SOB. Pt performing tasks with min difficulty today. Wife assists pt as needed with ADLs and completes all housework/cooking tasks. Pt is near his baseline but would benefit from [facility] OT evaluation to assess home functioning and educate on strategies for energy conservation.  No further acute OT services required at this time.      Recommendations for follow up therapy are one component of a multi-disciplinary discharge planning process, led by the attending physician.  Recommendations may be updated based on patient status, additional functional criteria and insurance  authorization.   Follow Up Recommendations  Home health OT     Assistance Recommended at Discharge Intermittent Supervision/Assistance  Patient can return home with the following A little help with bathing/dressing/bathroom;Assistance with cooking/housework    Functional Status Assessment  Patient has had a recent decline in their functional status and demonstrates the ability to make significant improvements in function in a reasonable and predictable amount of time.  Equipment Recommendations  None recommended by OT       Precautions / Restrictions Precautions Precautions: Fall Restrictions Weight Bearing Restrictions: No      Mobility Bed Mobility Overal bed mobility: Modified Independent             General bed mobility comments: Increased time    Transfers Overall transfer level: Needs assistance Equipment used: None Transfers: Sit to/from Stand, Bed to chair/wheelchair/BSC Sit to Stand: Min guard Stand pivot transfers: Min guard                    ADL either performed or assessed with clinical judgement   ADL Overall ADL's : Needs assistance/impaired Eating/Feeding: Modified independent;Sitting               Upper Body Dressing : Minimal assistance;Sitting   Lower Body Dressing: Maximal assistance;Bed level Lower Body Dressing Details (indicate cue type and reason): max assist for donning socks, pt reports wife has to help him donn and doff at home Toilet Transfer: Min Psychiatric nurse Details (indicate cue type and reason): simulated  with bed to chair transfer           General ADL Comments: Pt near baseline, limited significantly by SOB      Pertinent Vitals/Pain Pain Assessment Pain Assessment: No/denies pain     Hand Dominance Right   Extremity/Trunk Assessment Upper Extremity Assessment Upper Extremity Assessment: Defer to OT evaluation   Lower Extremity Assessment Lower Extremity Assessment: Overall WFL for tasks  assessed   Cervical / Trunk Assessment Cervical / Trunk Assessment: Normal   Communication Communication Communication: No difficulties (Simultaneous filing. User may not have seen previous data.)   Cognition Arousal/Alertness: Awake/alert Behavior During Therapy: WFL for tasks assessed/performed Overall Cognitive Status: Within Functional Limits for tasks assessed                                                  Home Living Family/patient expects to be discharged to:: Private residence (Simultaneous filing. User may not have seen previous data.) Living Arrangements: Spouse/significant other (Simultaneous filing. User may not have seen previous data.) Available Help at Discharge: Family;Available 24 hours/day (Simultaneous filing. User may not have seen previous data.) Type of Home: House (Simultaneous filing. User may not have seen previous data.) Home Access: Level entry (Simultaneous filing. User may not have seen previous data.)     Home Layout: One level (Simultaneous filing. User may not have seen previous data.)     Bathroom Shower/Tub: Walk-in shower (Simultaneous filing. User may not have seen previous data.)   Bathroom Toilet: Handicapped height (Simultaneous filing. User may not have seen previous data.) Bathroom Accessibility: Yes   Home Equipment: Rolling Walker (2 wheels);Shower seat (Simultaneous filing. User may not have seen previous data.)          Prior Functioning/Environment Prior Level of Function : Needs assist (Simultaneous filing. User may not have seen previous data.)       Physical Assist : ADLs (physical)     Mobility Comments: Pt independent in the house from living room to bathroom. Has to rest before returning. (Simultaneous filing. User may not have seen previous data.) ADLs Comments: Wife assists with dressing, bathing, does the housework and cooking (Simultaneous filing. User may not have seen previous data.)         OT Problem List: Decreased strength;Decreased activity tolerance;Cardiopulmonary status limiting activity                Co-evaluation PT/OT/SLP Co-Evaluation/Treatment: Yes Reason for Co-Treatment: Complexity of the patient's impairments (multi-system involvement)   OT goals addressed during session: ADL's and self-care      AM-PAC OT "6 Clicks" Daily Activity     Outcome Measure Help from another person eating meals?: None Help from another person taking care of personal grooming?: A Little Help from another person toileting, which includes using toliet, bedpan, or urinal?: A Little Help from another person bathing (including washing, rinsing, drying)?: A Lot Help from another person to put on and taking off regular upper body clothing?: A Lot Help from another person to put on and taking off regular lower body clothing?: A Lot 6 Click Score: 16   End of Session Equipment Utilized During Treatment: Gait belt;Oxygen Nurse Communication: Mobility status  Activity Tolerance: Patient tolerated treatment well Patient left: in chair;with call bell/phone within reach;with nursing/sitter in room  OT Visit Diagnosis: Muscle weakness (generalized) (M62.81)  Time: 1130-1145 OT Time Calculation (min): 15 min Charges:  OT General Charges $OT Visit: 1 Visit OT Evaluation $OT Eval Low Complexity: [place], OTR/L  [phone] 11/14/2022, 11:58 AM

## 2022-11-14 NOTE — Plan of Care (Signed)
  Problem: Acute Rehab PT Goals(only PT should resolve) Goal: Patient Will Transfer Sit To/From Stand Outcome: Progressing Flowsheets (Taken 11/14/2022 1213) Patient will transfer sit to/from stand: with modified independence Goal: Pt Will Transfer Bed To Chair/Chair To Bed Outcome: Progressing Flowsheets (Taken 11/14/2022 1213) Pt will Transfer Bed to Chair/Chair to Bed: with modified independence Goal: Pt Will Ambulate Outcome: Progressing Flowsheets (Taken 11/14/2022 1213) Pt will Ambulate:  25 feet  with modified independence  with least restrictive assistive device Goal: Pt/caregiver will Perform Home Exercise Program Outcome: Progressing Flowsheets (Taken 11/14/2022 1213) Pt/caregiver will Perform Home Exercise Program:  For increased strengthening  For improved balance  Independently  12:14 PM, 11/14/22 Mearl Latin PT, DPT Physical Therapist at [facility]

## 2022-11-14 NOTE — Progress Notes (Signed)
PROGRESS NOTE    Patient: Mark Cannon                            PCP: Jake Samples, PA-C                    DOB: 13-Feb-1951                       DOA: 11/13/2022 OFB:[id number]                        DOS: 11/14/2022, 11:12 AM   LOS: 0 days   Date of Service: The patient was seen and examined on 11/14/2022  Subjective:   The patient was seen and examined this morning. Hemodynamically stable. Tolerated and status post thoracentesis yielding 900 mL yesterday November 13, 2022 Which improved O2 demand, down to baseline 4 L of oxygen satting 93 - 96%  Brief Narrative:   Mark Cannon  is a 72 year old male with h/o HTN, HLD,   prostate cancer (S/P prostatectomy), OSA, CKD stage III, chronic respiratory failure on 4 L, tobacco abuse in remission presenting with progressive shortness of breath over past 24 hours. Baseline O2 demand 4 L, up to 6 at home and now up to 10 L of oxygen.  Reporting similar presentation with hypoxia and anemia in the past 2 hospitalization October 2023 and October 15, 2022... He was subsequently discharged on 4 L of oxygen In October he was transfused with 1 unit of PRBC, back in January he was transfused with 2 units of PRBC.   GI workup was deferred to the outpatient setting.  Colonoscopy was planned given his history of iron deficiency anemia.  Subsequently was canceled. He has not had any follow-up since.   He presents again with generalized weakness and dyspnea.  He denies any hematochezia, melena, hematemesis, hematuria.  He has not had any fevers, chills, chest pain, nausea, vomiting, diarrhea, abdominal pain.  He denies any NSAIDs.    ED evaluation: Blood pressure (!) 147/60, pulse 95, Temp. 98.1 F (36.7 C), RR 15, SpO2 100 % on 10 L of oxygen HFNC   WBC 7.9, hemoglobin 7.6, hematocrit 28.5, calcium 8.6, troponin 19, glucose 183, BNP 183.0  Hemoccult positive X 2     Respiratory panel : COVID, RSV, Flu were all negative CTA: Negative for  pulmonary embolism, pleural effusion, fluid overload.   He was started on IV Lasix     Assessment and Plan:  Acute on chronic respiratory failure with hypoxia -due to fluid overload/pleural effusion -Status post thoracentesis yielding 900 mL, and Lasix -Check COVID, RSV, Flu--all negative -CTA: Bilateral pleural effusion, congestion,, negative for pulmonary embolism -at [facility] line O2  4L a -Now on 10 L HFNC>> down back to baseline of 4 L of oxygen, satting 93%  -Continue as needed DuoNeb bronchodilator treatments   Bilateral pleural effusion  -Status post thoracentesis from the right, yielding 900 mL fluid, pending fluid cytology and analysis _Pleural fluid cloudy, monocytes of 35, glucose 145, LD 67, lymphocyte of 51, neutrophil of 14, (Thus far not consistent with infection)  -Consult Dr. Melvyn Novas for further evaluation recommendations  symptomatic anemia -Patient has chronic blood loss -Hemoglobin 8.4 at the time of his discharge on 10/15/2022 - Hemoccult positive X 2    Latest Ref Rng & Units 11/14/2022    7:06 AM 11/13/2022  11:36 AM 10/25/2022    2:26 PM  CBC  WBC 4.0 - 10.5 K/uL 7.7  7.9  7.4   Hemoglobin 13.0 - 17.0 g/dL 9.5  7.6  8.2   Hematocrit 39.0 - 52.0 % 33.2  28.5  28.0   Platelets 150 - 400 K/uL 237  249  413      -ferritin 8, iron sat 23% -GI consult appreciated  -Status post transfusion of 2U PRBC  (On Last admission was also transfused with 2U PRBC -pantoprazole I -10/15/22 EGD--normal -10/15/22 colonoscopy--8 polyps removed. +diverticulosis -GI consulted following, anticipating capsule endoscopy    Acute HFpEF -started on IV lasix -05/15/22 Echo--EF 60-65%, G1DD, no WMA, normal RVF -10/14/22 ReDS = 36 -d/c home with lasix 40 mg po daily   CKD stage IIIa -Baseline creatinine 1.1-1.4 -monitor with diuresis -serum creatinine 1.103 >> 0.95 >> 1.05,   Pulmonary nodular infiltrates -12/31 CT chest--RUL mass like consolidations -CTA: No mention of  nodules,Bilateral pleural effusion, congestion,, negative for pulmonary embolism    Uncontrolled diabetes mellitus type 2 with hyperglycemia -Resume  Amaryl-- -05/14/2022 A1c 7.7 -12/30 Repeat A1c--7.3 - SSI    Iron deficiency Anemia -iron sat 23 -ferritin 8 -Cont.  ferrous sulfate   Essential hypertension -restarted amlodipine 5 mg daily   COPD -stable -he is not on any maintenance medications at home -Quit smoking in September 2023 after 50 pack years   History of stroke -Intolerant to statin -On Aspirin    GERD -Continue pantoprazole   Hyperlipidemia -Intolerant to statin   Class II obesity -Lifestyle modification Body mass index is 34.06 kg/m.    OSA Not on CPAP secondary to intolerance.      --------------------------------------------------------------------------------------------------------------------------------------- Nutritional status:  The patient's BMI is: Body mass index is 34.06 kg/m. I agree with the assessment and plan as outlined below: Nutrition Status:      -----------------------------------------------------------------------------------------------------------------------------------------  DVT prophylaxis:  Place and maintain sequential compression device Start: 11/13/22 1512 TED hose Start: 11/13/22 1434 SCDs Start: 11/13/22 1434   Code Status:   Code Status: Full Code  Family Communication: Discussed with his wife over the phone The above findings and plan of care has been discussed with patient (and family)  in detail,  they expressed understanding and agreement of above. -Advance care planning has been discussed.   Admission status:   Status is: Observation The patient will require care spanning > 2 midnights and should be moved to inpatient because: Continues to be in respiratory distress   Disposition: From  - home             Planning for discharge in 1-2 days: to   Procedures:   No admission procedures for  hospital encounter.   Antimicrobials:  Anti-infectives (From admission, onward)    None        Medication:   sodium chloride   Intravenous Once   amLODipine  5 mg Oral Daily   Chlorhexidine Gluconate Cloth  6 each Topical Daily   docusate sodium  200 mg Oral Daily   ferrous sulfate  325 mg Oral TID WC   furosemide  40 mg Intravenous BID   glimepiride  1 mg Oral BID WC   influenza vaccine adjuvanted  0.5 mL Intramuscular Tomorrow-1000   pantoprazole  40 mg Oral BID   sodium chloride flush  3 mL Intravenous Q12H   sodium chloride flush  3 mL Intravenous Q12H    acetaminophen **OR** acetaminophen, hydrALAZINE, HYDROmorphone (DILAUDID) injection, ipratropium, levalbuterol, ondansetron **OR** ondansetron (ZOFRAN)  IV, oxyCODONE, senna-docusate, sodium phosphate, traZODone   Objective:   Vitals:   11/14/22 0758 11/14/22 0800 11/14/22 0900 11/14/22 1000  BP: (!) 153/74 (!) 153/74 (!) 143/62 136/71  Pulse: 99 97 83 90  Resp: (!) 9 12    Temp: 98 F (36.7 C)     TempSrc: Oral     SpO2: 91% 91% 96% 93%  Weight:      Height:        Intake/Output Summary (Last 24 hours) at 11/14/2022 1112 Last data filed at 11/14/2022 0419 Gross per 24 hour  Intake 689 ml  Output 1500 ml  Net -811 ml   Filed Weights   11/13/22 1130 11/13/22 2303 11/14/22 0500  Weight: 102.5 kg 101.1 kg 101.6 kg     Physical examination:   General:  AAO x 3,  cooperative, no distress;   HEENT:  Normocephalic, PERRL, otherwise with in Normal limits   Neuro:  CNII-XII intact. , normal motor and sensation, reflexes intact   Lungs:   Clear to auscultation BL, Respirations unlabored,  No wheezes / crackles  Cardio:    S1/S2, RRR, No murmure, No Rubs or Gallops   Abdomen:  Soft, non-tender, bowel sounds active all four quadrants, no guarding or peritoneal signs.  Muscular  skeletal:  Limited exam -global generalized weaknesses - in bed, able to move all 4 extremities,   2+ pulses,  symmetric, No pitting  edema  Skin:  Dry, warm to touch, negative for any Rashes,  Wounds: Please see nursing documentation    ------------------------------------------------------------------------------------------------------------------------------------------    LABs:     Latest Ref Rng & Units 11/14/2022    7:06 AM 11/13/2022   11:36 AM 10/25/2022    2:26 PM  CBC  WBC 4.0 - 10.5 K/uL 7.7  7.9  7.4   Hemoglobin 13.0 - 17.0 g/dL 9.5  7.6  8.2   Hematocrit 39.0 - 52.0 % 33.2  28.5  28.0   Platelets 150 - 400 K/uL 237  249  413       Latest Ref Rng & Units 11/14/2022    7:06 AM 11/13/2022   11:36 AM 10/25/2022    2:26 PM  CMP  Glucose 70 - 99 mg/dL 96  183  187   BUN 8 - 23 mg/dL '9  10  11   '$ Creatinine 0.61 - 1.24 mg/dL 1.05  0.95  1.03   Sodium 135 - 145 mmol/L 139  138  142   Potassium 3.5 - 5.1 mmol/L 3.7  4.3  4.4   Chloride 98 - 111 mmol/L 87  90  92   CO2 22 - 32 mmol/L 43  40  34   Calcium 8.9 - 10.3 mg/dL 8.5  8.6  9.3        Micro Results Recent Results (from the past 240 hour(s))  Resp panel by RT-PCR (RSV, Flu A&B, Covid) Anterior Nasal Swab     Status: None   Collection Time: 11/13/22 11:48 AM   Specimen: Anterior Nasal Swab  Result Value Ref Range Status   SARS Coronavirus 2 by RT PCR NEGATIVE NEGATIVE Final    Comment: (NOTE) SARS-CoV-2 target nucleic acids are NOT DETECTED.  The SARS-CoV-2 RNA is generally detectable in upper respiratory specimens during the acute phase of infection. The lowest concentration of SARS-CoV-2 viral copies this assay can detect is 138 copies/mL. A negative result does not preclude SARS-Cov-2 infection and should not be used as the sole basis for treatment or other patient  management decisions. A negative result may occur with  improper specimen collection/handling, submission of specimen other than nasopharyngeal swab, presence of viral mutation(s) within the areas targeted by this assay, and inadequate number of viral copies(<138 copies/mL). A  negative result must be combined with clinical observations, patient history, and epidemiological information. The expected result is Negative.  Fact Sheet for Patients:  EntrepreneurPulse.com.au  Fact Sheet for Healthcare Providers:  IncredibleEmployment.be  This test is no t yet approved or cleared by the [place] FDA and  has been authorized for detection and/or diagnosis of SARS-CoV-2 by FDA under an Emergency Use Authorization (EUA). This EUA will remain  in effect (meaning this test can be used) for the duration of the COVID-19 declaration under Section 564(b)(1) of the Act, 21 U.S.C.section 360bbb-3(b)(1), unless the authorization is terminated  or revoked sooner.       Influenza A by PCR NEGATIVE NEGATIVE Final   Influenza B by PCR NEGATIVE NEGATIVE Final    Comment: (NOTE) The Xpert Xpress SARS-CoV-2/FLU/RSV plus assay is intended as an aid in the diagnosis of influenza from Nasopharyngeal swab specimens and should not be used as a sole basis for treatment. Nasal washings and aspirates are unacceptable for Xpert Xpress SARS-CoV-2/FLU/RSV testing.  Fact Sheet for Patients: EntrepreneurPulse.com.au  Fact Sheet for Healthcare Providers: IncredibleEmployment.be  This test is not yet approved or cleared by the [place] FDA and has been authorized for detection and/or diagnosis of SARS-CoV-2 by FDA under an Emergency Use Authorization (EUA). This EUA will remain in effect (meaning this test can be used) for the duration of the COVID-19 declaration under Section 564(b)(1) of the Act, 21 U.S.C. section 360bbb-3(b)(1), unless the authorization is terminated or revoked.     Resp Syncytial Virus by PCR NEGATIVE NEGATIVE Final    Comment: (NOTE) Fact Sheet for Patients: EntrepreneurPulse.com.au  Fact Sheet for Healthcare  Providers: IncredibleEmployment.be  This test is not yet approved or cleared by the [place] FDA and has been authorized for detection and/or diagnosis of SARS-CoV-2 by FDA under an Emergency Use Authorization (EUA). This EUA will remain in effect (meaning this test can be used) for the duration of the COVID-19 declaration under Section 564(b)(1) of the Act, 21 U.S.C. section 360bbb-3(b)(1), unless the authorization is terminated or revoked.  Performed at [facility], [address], [place], [place] [address]   Gram stain     Status: None   Collection Time: 11/13/22  3:30 PM   Specimen: Pleura  Result Value Ref Range Status   Specimen Description PLEURAL  Final   Special Requests NONE  Final   Gram Stain   Final    WBC PRESENT, PREDOMINANTLY MONONUCLEAR NO ORGANISMS SEEN CYTOSPIN SMEAR Performed at [facility], [address], [place], [place] [address]    Report Status 11/13/2022 FINAL  Final  Culture, body fluid w Gram Stain-bottle     Status: None (Preliminary result)   Collection Time: 11/13/22  3:30 PM   Specimen: Pleura  Result Value Ref Range Status   Specimen Description PLEURAL  Final   Special Requests   Final    BOTTLES DRAWN AEROBIC AND ANAEROBIC Blood Culture adequate volume Performed at [facility], [address] [place], [place] [address]    Culture PENDING  Incomplete   Report Status PENDING  Incomplete  MRSA Next Gen by PCR, Nasal     Status: None   Collection Time: 11/14/22 12:00 AM   Specimen: Nasal Mucosa; Nasal Swab  Result Value Ref  Range Status   MRSA by PCR Next Gen NOT DETECTED NOT DETECTED Final    Comment: (NOTE) The GeneXpert MRSA Assay (FDA approved for NASAL specimens only), is one component of a comprehensive MRSA colonization surveillance program. It is not intended to diagnose MRSA infection nor to guide or monitor treatment for MRSA infections. Test performance is not FDA approved in patients less than 41  years old. Performed at [facility], [address] [place], [place] [address]     Radiology Reports DG Chest 1 View  Result Date: 11/13/2022 CLINICAL DATA:  RIGHT pleural effusion post thoracentesis EXAM: CHEST  1 VIEW COMPARISON:  Repeat exam 1544 hours compared to 1159 hours FINDINGS: Enlargement of cardiac silhouette. Mediastinal contours and pulmonary vascularity normal. Decreased RIGHT pleural effusion and basilar atelectasis. No pneumothorax following thoracentesis. Persistent small LEFT pleural effusion and mild LEFT basilar atelectasis. Prior cervical spine fusion. IMPRESSION: No pneumothorax following RIGHT thoracentesis. Electronically Signed   By: Lavonia Dana M.D.   On: 11/13/2022 16:07   US THORACENTESIS ASP PLEURAL SPACE W/IMG GUIDE  Result Date: 11/13/2022 INDICATION: Bilateral pleural effusions Right greater than left EXAM: ULTRASOUND GUIDED Right THORACENTESIS MEDICATIONS: 10 cc 1% lidocaine. COMPLICATIONS: None immediate. PROCEDURE: An ultrasound guided thoracentesis was thoroughly discussed with the patient and questions answered. The benefits, risks, alternatives and complications were also discussed. The patient understands and wishes to proceed with the procedure. Written consent was obtained. Ultrasound was performed to localize and mark an adequate pocket of fluid in the right chest. The area was then prepped and draped in the normal sterile fashion. 1% Lidocaine was used for local anesthesia. Under ultrasound guidance a 7 cm Yueh catheter was introduced. Thoracentesis was performed. The catheter was removed and a dressing applied. FINDINGS: A total of approximately 900 cc of amber fluid was removed. Samples were sent to the laboratory as requested by the clinical team. IMPRESSION: Successful ultrasound guided right thoracentesis yielding 900 cc of pleural fluid. Post procedure CXR: NO PTX per Dr Thornton Papas Read by Lavonia Drafts [facility] Electronically Signed   By: Lavonia Dana M.D.    On: 11/13/2022 16:07   CT Angio Chest PE W and/or Wo Contrast  Result Date: 11/13/2022 CLINICAL DATA:  Shortness of breath. EXAM: CT ANGIOGRAPHY CHEST WITH CONTRAST TECHNIQUE: Multidetector CT imaging of the chest was performed using the standard protocol during bolus administration of intravenous contrast. Multiplanar CT image reconstructions and MIPs were obtained to evaluate the vascular anatomy. RADIATION DOSE REDUCTION: This exam was performed according to the departmental dose-optimization program which includes automated exposure control, adjustment of the mA and/or kV according to patient size and/or use of iterative reconstruction technique. CONTRAST:  1m OMNIPAQUE IOHEXOL 350 MG/ML SOLN COMPARISON:  CT of the chest on 10/13/2022 and CTA of the chest on 08/08/2022 FINDINGS: Cardiovascular: Adequate opacification of pulmonary arteries to the subsegmental level. No evidence of pulmonary embolism. Dilated central pulmonary arteries with the main pulmonary artery measuring 3.2 cm. Mild cardiac enlargement. Atherosclerosis of thoracic aorta without aneurysm. Calcified coronary artery plaque present. Calcified aortic valve. No pericardial fluid identified. Reflux of contrast into the intrahepatic IVC and hepatic veins is consistent with some degree right heart failure/elevated right heart pressures. Mediastinum/Nodes: No enlarged mediastinal, hilar, or axillary lymph nodes. Thyroid gland, trachea, and esophagus demonstrate no significant findings. Lungs/Pleura: Right lung pneumonia seen previously has resolved. There is chronic atelectasis in the lingula. Bilateral pleural effusions present, right greater than left with moderate volume on the right and small  volume on the left. Lungs demonstrate no overt pulmonary edema. There is atelectasis at the right base likely reflecting compressive atelectasis from pleural fluid. No pneumothorax. Upper Abdomen: Stable evidence of diffuse hepatic steatosis and  probable early cirrhotic morphology of the liver. Status post cholecystectomy. Musculoskeletal: Stable angioma of the L1 vertebral body. No fractures. Review of the MIP images confirms the above findings. IMPRESSION: 1. No evidence of pulmonary embolism. 2. Dilated central pulmonary arteries consistent with pulmonary hypertension. 3. Bilateral pleural effusions, right greater than left with moderate volume on the right and small volume on the left. Associated compressive atelectasis at the right lung base. 4. Stable hepatic steatosis and probable early cirrhotic morphology of the liver. 5. Aortic atherosclerosis and coronary atherosclerosis. 6. Calcified aortic valve. 7. Right heart failure with reflux of contrast into the IVC and hepatic veins. Aortic Atherosclerosis (ICD10-I70.0). Electronically Signed   By: Aletta Edouard M.D.   On: 11/13/2022 13:55   DG Chest Port 1 View  Result Date: 11/13/2022 CLINICAL DATA:  Shortness of breath, chest congestion EXAM: PORTABLE CHEST 1 VIEW COMPARISON:  10/12/2022 FINDINGS: Small left pleural effusion. Bibasilar atelectasis or infiltrates. Heart is normal size. Mediastinal contours within normal limits. No acute bony abnormality. IMPRESSION: Small left pleural effusion.  Bibasilar atelectasis or infiltrates. Electronically Signed   By: Rolm Baptise M.D.   On: 11/13/2022 12:07    SIGNED: Deatra Mauro, MD, FHM. FAAFP. [facility] - [facility] Time spent > 55 min. Was spent in seeing, evaluating and examining the patient. Reviewing medical records, labs, drawn plan of care. [facility],  Pager (please use [email or web address] to page/ text) Please use Epic Secure Chat for non-urgent communication (7AM-7PM)  If 7PM-7AM, please contact night-coverage [email or web address], 11/14/2022, 11:12 AM

## 2022-11-14 NOTE — Evaluation (Signed)
Physical Therapy Evaluation Patient Details Name: Mark Cannon MRN: [id number] DOB: Oct 29, 1950 Today's Date: 11/14/2022  History of Present Illness  Mark Cannon  is a 72 year old male with h/o HTN, HLD,   prostate cancer (S/P prostatectomy), OSA, CKD stage III, chronic respiratory failure on 4 L, tobacco abuse in remission presenting with progressive shortness of breath over past 24 hours.  Baseline O2 demand 4 L, up to 6 at home and now up to 10 L of oxygen.     Reporting similar presentation with hypoxia and anemia in the past 2 hospitalization October 2023 and October 15, 2022... He was subsequently discharged on 4 L of oxygen  In October he was transfused with 1 unit of PRBC, back in January he was transfused with 2 units of PRBC.      GI workup was deferred to the outpatient setting.  Colonoscopy was planned given his history of iron deficiency anemia.  Subsequently was canceled.  He has not had any follow-up since.      He presents again with generalized weakness and dyspnea.  He denies any hematochezia, melena, hematemesis, hematuria.  He has not had any fevers, chills, chest pain, nausea, vomiting, diarrhea, abdominal pain.  He denies any NSAIDs.   Clinical Impression  Patient limited for functional mobility as stated below secondary to BLE weakness, fatigue and impaired standing balance. Patient does not require assist to transition to seated EOB and demonstrates good sitting tolerance at bedside. Patient relies on momentum to transfer to standing without assist. Patient ambulates several small steps to chair and ends session seated in chair.  Patient will benefit from continued physical therapy in hospital and recommended venue below to increase strength, balance, endurance for safe ADLs and gait.        Recommendations for follow up therapy are one component of a multi-disciplinary discharge planning process, led by the attending physician.  Recommendations may be updated based on patient  status, additional functional criteria and insurance authorization.  Follow Up Recommendations Home health PT      Assistance Recommended at Discharge Intermittent Supervision/Assistance  Patient can return home with the following  A little help with walking and/or transfers;A little help with bathing/dressing/bathroom;Assistance with cooking/housework    Equipment Recommendations None recommended by PT  Recommendations for Other Services       Functional Status Assessment Patient has had a recent decline in their functional status and demonstrates the ability to make significant improvements in function in a reasonable and predictable amount of time.     Precautions / Restrictions Precautions Precautions: Fall Restrictions Weight Bearing Restrictions: No      Mobility  Bed Mobility Overal bed mobility: Modified Independent             General bed mobility comments: labored, requires increased time    Transfers Overall transfer level: Needs assistance Equipment used: None Transfers: Sit to/from Stand, Bed to chair/wheelchair/BSC Sit to Stand: Min guard   Step pivot transfers: Min guard       General transfer comment: labored, requires momentum    Ambulation/Gait Ambulation/Gait assistance: Min guard Gait Distance (Feet): 3 Feet Assistive device: None Gait Pattern/deviations: Decreased stride length, Shuffle       General Gait Details: shuffled steps at bedside to ambulate to chair  Stairs            Wheelchair Mobility    Modified Rankin (Stroke Patients Only)       Balance Overall balance assessment: Needs assistance Sitting-balance support:  No upper extremity supported, Feet supported Sitting balance-Leahy Scale: Good     Standing balance support: No upper extremity supported Standing balance-Leahy Scale: Fair                               Pertinent Vitals/Pain      Home Living Family/patient expects to be  discharged to:: Private residence (Simultaneous filing. User may not have seen previous data.) Living Arrangements: Spouse/significant other (Simultaneous filing. User may not have seen previous data.) Available Help at Discharge: Family;Available 24 hours/day (Simultaneous filing. User may not have seen previous data.) Type of Home: House (Simultaneous filing. User may not have seen previous data.) Home Access: Level entry (Simultaneous filing. User may not have seen previous data.)       Home Layout: One level (Simultaneous filing. User may not have seen previous data.) Home Equipment: Rolling Walker (2 wheels);Shower seat (Simultaneous filing. User may not have seen previous data.)      Prior Function Prior Level of Function : Needs assist (Simultaneous filing. User may not have seen previous data.)       Physical Assist : ADLs (physical)     Mobility Comments: Pt independent in the house from living room to bathroom. Has to rest before returning. (Simultaneous filing. User may not have seen previous data.) ADLs Comments: Wife assists with dressing, bathing, does the housework and cooking (Simultaneous filing. User may not have seen previous data.)     Hand Dominance   Dominant Hand: Right    Extremity/Trunk Assessment   Upper Extremity Assessment Upper Extremity Assessment: Defer to OT evaluation    Lower Extremity Assessment Lower Extremity Assessment: Overall WFL for tasks assessed    Cervical / Trunk Assessment Cervical / Trunk Assessment: Normal  Communication   Communication: No difficulties (Simultaneous filing. User may not have seen previous data.)  Cognition Arousal/Alertness: Awake/alert Behavior During Therapy: WFL for tasks assessed/performed Overall Cognitive Status: Within Functional Limits for tasks assessed                                          General Comments      Exercises     Assessment/Plan    PT Assessment Patient  needs continued PT services  PT Problem List Decreased strength;Decreased activity tolerance;Decreased balance;Decreased mobility;Cardiopulmonary status limiting activity       PT Treatment Interventions DME instruction;Therapeutic exercise;Gait training;Balance training;Manual techniques;Stair training;Neuromuscular re-education;Functional mobility training;Therapeutic activities;Patient/family education    PT Goals (Current goals can be found in the Care Plan section)  Acute Rehab PT Goals Patient Stated Goal: return home PT Goal Formulation: With patient Time For Goal Achievement: 11/28/22 Potential to Achieve Goals: Good    Frequency Min 3X/week     Co-evaluation   Reason for Co-Treatment: Complexity of the patient's impairments (multi-system involvement)   OT goals addressed during session: ADL's and self-care       AM-PAC PT "6 Clicks" Mobility  Outcome Measure Help needed turning from your back to your side while in a flat bed without using bedrails?: None Help needed moving from lying on your back to sitting on the side of a flat bed without using bedrails?: A Little Help needed moving to and from a bed to a chair (including a wheelchair)?: A Little Help needed standing up from a chair using your arms (e.g.,  wheelchair or bedside chair)?: A Little Help needed to walk in hospital room?: A Little Help needed climbing 3-5 steps with a railing? : A Little 6 Click Score: 19    End of Session Equipment Utilized During Treatment: Oxygen Activity Tolerance: Patient tolerated treatment well Patient left: in chair;with call bell/phone within reach Nurse Communication: Mobility status PT Visit Diagnosis: Unsteadiness on feet (R26.81);Other abnormalities of gait and mobility (R26.89);Muscle weakness (generalized) (M62.81)    Time: 6073-7106 PT Time Calculation (min) (ACUTE ONLY): 12 min   Charges:   PT Evaluation $PT Eval Low Complexity: 1 Low          12:11 PM,  11/14/22 Mearl Latin PT, DPT Physical Therapist at [facility]

## 2022-11-14 NOTE — Consult Note (Addendum)
NAME:  Mark Cannon, MRN:  [id number], DOB:  [birth year], LOS: 0 ADMISSION DATE:  11/13/2022, CONSULTATION DATE:  2/1 REFERRING MD:  Roger Shelter, CHIEF COMPLAINT:  resp failure/ pl effusion   History of Present Illness:  71 yowm quit smoking 07/2022  on chronic 02 @ 4lpm with presumptive dx of copd s prior pfts presented with progressive sob x 24 h prior to admit with w/u c/w R > L pleural effusion tapped 1/31 with 900 cc serosnaguinous fluid removed and PCCM consulted am 2/1 with persistent resp failure   Pt admitted 12/29-10/15/22 with dyspnea/anemia and R UL mass like consolidation c/w pna though no clinical features of acute infection/ dischaged on omnicef /zmax with f/u CT planned for 2 m but CTa done 1/31 consolidation resolved, R> L effusion and signs of PH s PE   Pertinent  Medical History  Chronic resp failure with hypercarbia and hypoxemia  Severe anemia >>> 10/15/22 colonoscopy--8 polyps removed. +diverticulosis  HBP with G 1 diastolic dysfunction 12/14/18 and nl LA/RA Prostate ca OSA CKD stage 3 Abnormal Chest CT  with RUL masslike consolidation        Significant Hospital Events: Including procedures, antibiotic start and stop dates in addition to other pertinent events   R  t centesis x 900 cc transudative    Scheduled Meds:  sodium chloride   Intravenous Once   amLODipine  5 mg Oral Daily   Chlorhexidine Gluconate Cloth  6 each Topical Daily   docusate sodium  200 mg Oral Daily   ferrous sulfate  325 mg Oral TID WC   furosemide  40 mg Intravenous BID   glimepiride  1 mg Oral BID WC   influenza vaccine adjuvanted  0.5 mL Intramuscular Tomorrow-1000   pantoprazole  40 mg Oral BID   sodium chloride flush  3 mL Intravenous Q12H   sodium chloride flush  3 mL Intravenous Q12H   Continuous Infusions:  sodium chloride 20 mL/hr at 11/14/22 0419   PRN Meds:.acetaminophen **OR** acetaminophen, hydrALAZINE, HYDROmorphone (DILAUDID) injection, ipratropium, levalbuterol,  ondansetron **OR** ondansetron (ZOFRAN) IV, oxyCODONE, senna-docusate, sodium phosphate, traZODone    Interim History / Subjective:  Better p R thoracentesis 1/31 but says hasn't been doing well for several years due to doe and now housbound and desats on 4lpm walking across the room at home with mild chronic orthopnea   Objective   Blood pressure 136/74, pulse (!) 2, temperature 99.3 F (37.4 C), temperature source Oral, resp. rate (!) 24, height '5\' 8"'$  (1.727 m), weight 101.6 kg, SpO2 95 %.        Intake/Output Summary (Last 24 hours) at 11/14/2022 0736 Last data filed at 11/14/2022 0419 Gross per 24 hour  Intake 689 ml  Output 1500 ml  Net -811 ml   Filed Weights   11/13/22 1130 11/13/22 2303 11/14/22 0500  Weight: 102.5 kg 101.1 kg 101.6 kg    Examination: Tmax:  99.6 General appearance:    obese elderly wm lying immobile in bed at 30 degrees hob  At Rest 02 sats  93% on 4lpm   No jvd Oropharynx clear,  mucosa nl Neck supple Lungs with distant bs bases bilaterally esp on L  RRR no s3 or or sign murmur Abd obese with limited excursion  Extr warm with no   clubbing noted - trace pitting edema Neuro  Sensorium intact,  no apparent motor deficits      I personally reviewed images and agree with radiology impression as follows:  CXR:  portable 1/31  Enlargement of cardiac silhouette. Mediastinal contours and pulmonary vascularity normal. Decreased RIGHT pleural effusion and basilar atelectasis. No pneumothorax following thoracentesis. Persistent small LEFT pleural effusion and mild LEFT basilar atelectasis.   Assessment & Plan:  1) Acute on chronic hypoxemic and hypercaribic resp failure probably more OHS than COPD >>> continue duonebs/ pfts as outpt next available/ ? Needs sleep study too?  2) diastolic dysfunction with bilateral R > L pleural effusion - s/p tap on R transudative so no need to tap L >>> add aldactone to lasix and change amlodipine to bisoprolol     3) Atx on L secondary to effusion/ recumbancy  >>> mobilize/ add IS  4) Anemia aggravating chronic 02 dependency ? GIB w/u in progress     Labs   CBC: Recent Labs  Lab 11/13/22 1136  WBC 7.9  NEUTROABS 6.6  HGB 7.6*  HCT 28.5*  MCV 82.8  PLT 465    Basic Metabolic Panel: Recent Labs  Lab 11/13/22 1136  NA 138  K 4.3  CL 90*  CO2 40*  GLUCOSE 183*  BUN 10  CREATININE 0.95  CALCIUM 8.6*  MG 2.3  PHOS 3.4   GFR: Estimated Creatinine Clearance: 82.4 mL/min (by C-G formula based on SCr of 0.95 mg/dL). Recent Labs  Lab 11/13/22 1136  WBC 7.9    Liver Function Tests: No results for input(s): "AST", "ALT", "ALKPHOS", "BILITOT", "PROT", "ALBUMIN" in the last 168 hours. No results for input(s): "LIPASE", "AMYLASE" in the last 168 hours. No results for input(s): "AMMONIA" in the last 168 hours.  ABG    Component Value Date/Time   HCO3 38.9 (H) 08/08/2022 1611   TCO2 30 12/01/2016 0924   O2SAT 84.4 08/08/2022 1611     Coagulation Profile: Recent Labs  Lab 11/13/22 1136  INR 0.9    Cardiac Enzymes: No results for input(s): "CKTOTAL", "CKMB", "CKMBINDEX", "TROPONINI" in the last 168 hours.  HbA1C: Hgb A1c MFr Bld  Date/Time Value Ref Range Status  10/12/2022 10:14 AM 7.2 (H) 4.8 - 5.6 % Final    Comment:    (NOTE)         Prediabetes: 5.7 - 6.4         Diabetes: >6.4         Glycemic control for adults with diabetes: <7.0   05/14/2022 06:10 PM 7.7 (H) 4.8 - 5.6 % Final    Comment:    (NOTE) Pre diabetes:          5.7%-6.4%  Diabetes:              >6.4%  Glycemic control for   <7.0% adults with diabetes     CBG: No results for input(s): "GLUCAP" in the last 168 hours.    Past Medical History:  He,  has a past medical history of Arthritis, Cancer ([place]), Colon polyps, Cough, GERD (gastroesophageal reflux disease), H/O tooth extraction (week of 03/15/2015 ), History of kidney stones, Hypercholesteremia, Hypertension, Lumbar stenosis with  neurogenic claudication, Pneumonia, Pre-diabetes, Renal disorder, and Sleep apnea.   Surgical History:   Past Surgical History:  Procedure Laterality Date   APPENDECTOMY     BACK SURGERY     CHOLECYSTECTOMY     COLONOSCOPY  02/04/2012   Procedure: COLONOSCOPY;  Surgeon: Jamesetta So, MD;  [facility];  Service: Gastroenterology;  Laterality: N/A;   COLONOSCOPY N/A 09/22/2018   Procedure: COLONOSCOPY;  Surgeon: Aviva Signs, MD;  [facility];  Service: Gastroenterology;  Laterality: N/A;   COLONOSCOPY W/ POLYPECTOMY     COLONOSCOPY WITH PROPOFOL N/A 10/15/2022   Procedure: COLONOSCOPY WITH PROPOFOL;  Surgeon: Eloise Harman, DO;  [facility];  Service: Endoscopy;  Laterality: N/A;   ESOPHAGOGASTRODUODENOSCOPY (EGD) WITH PROPOFOL N/A 10/15/2022   Procedure: ESOPHAGOGASTRODUODENOSCOPY (EGD) WITH PROPOFOL;  Surgeon: Eloise Harman, DO;  [facility];  Service: Endoscopy;  Laterality: N/A;   HERNIA REPAIR     Bilateral inguinal hernia repair X 4   hydrocelectomy     left knee arthroscopy     LITHOTRIPSY     LYMPHADENECTOMY Bilateral 03/22/2015   Procedure: PELVIC LYMPHADENECTOMY;  Surgeon: Alexis Frock, MD;  Location: [facility];  Service: Urology;  Laterality: Bilateral;   NM MYOVIEW LTD  2011   POLYPECTOMY  09/22/2018   Procedure: POLYPECTOMY;  Surgeon: Aviva Signs, MD;  [facility];  Service: Gastroenterology;;   POLYPECTOMY  10/15/2022   Procedure: POLYPECTOMY;  Surgeon: Eloise Harman, DO;  [facility];  Service: Endoscopy;;   ROBOT ASSISTED LAPAROSCOPIC RADICAL PROSTATECTOMY N/A 03/22/2015   Procedure: ROBOTIC ASSISTED LAPAROSCOPIC RADICAL PROSTATECTOMY WITH INDOCYANINE GREEN DYE INJECTION;  Surgeon: Alexis Frock, MD;  Location: [facility];  Service: Urology;  Laterality: N/A;   ROBOTIC ASSISTED LAPAROSCOPIC LYSIS OF ADHESION  03/22/2015   Procedure: ROBOTIC ASSISTED LAPAROSCOPIC LYSIS OF ADHESION;  Surgeon:  Alexis Frock, MD;  Location: [facility];  Service: Urology;;   SHOULDER SURGERY Right    VASECTOMY       Social History:   reports that he has quit smoking. His smoking use included cigarettes. He has a 55.00 pack-year smoking history. He has been exposed to tobacco smoke. He has never used smokeless tobacco. He reports that he does not currently use alcohol after a past usage of about 1.0 standard drink of alcohol per week. He reports that he does not use drugs.   Family History:  His family history includes Heart failure in his father; Stroke in his father. There is no history of Colon cancer.   Allergies Allergies  Allergen Reactions   Lidocaine Hives   Benzocaine Other (See Comments)    Blisters    Other     Novocaine - blisters in mouth     Home Medications  Prior to Admission medications   Medication Sig Start Date End Date Taking? Authorizing Provider  amLODipine (NORVASC) 5 MG tablet Take 1 tablet (5 mg total) by mouth daily. Patient taking differently: Take 10 mg by mouth daily. 10/16/22  Yes Tat, Shanon Brow, MD  docusate sodium (COLACE) 100 MG capsule Take 100 mg by mouth daily.   Yes [provider]  ferrous sulfate 325 (65 FE) MG tablet Take 1 tablet (325 mg total) by mouth daily with breakfast. 11/11/22  Yes Carlan, Chelsea L, NP  furosemide (LASIX) 40 MG tablet Take 1 tablet (40 mg total) by mouth daily. Patient taking differently: Take 40 mg by mouth daily. Order is for daily but states takes about every other day. Has informed his MD 10/15/22  Yes Tat, Shanon Brow, MD  glimepiride (AMARYL) 1 MG tablet Take 1 mg by mouth 2 (two) times daily. 09/18/22  Yes [provider]  pantoprazole (PROTONIX) 40 MG tablet Take 1 tablet (40 mg total) by mouth daily. 08/09/22  Yes Barton Dubois, MD  aspirin EC 81 MG tablet Take 1 tablet (81 mg total) by mouth daily. Swallow whole. Patient not taking: Reported on 11/11/2022 05/16/22   Barb Merino, MD  ipratropium-albuterol (DUONEB)  0.5-2.5 (3) MG/3ML SOLN Take 3 mLs by nebulization every 6 (six) hours as needed (SOB/wheezing.). Patient taking differently: Take 3 mLs by nebulization every 4 (four) hours as needed (SOB/wheezing.). 08/09/22 11/11/22  Barton Dubois, MD  OXYGEN Inhale 4 L into the lungs continuous.    [provider]  PRESCRIPTION MEDICATION Hormone therapy injection every 6 months through [facility] due to prostate cancer.    [provider]       Christinia Gully, MD Pulmonary and [place] [phone]   After 7:00 pm call Elink  [phone]

## 2022-11-15 ENCOUNTER — Telehealth: Payer: Self-pay | Admitting: Gastroenterology

## 2022-11-15 ENCOUNTER — Encounter (HOSPITAL_COMMUNITY): Payer: Self-pay | Admitting: Internal Medicine

## 2022-11-15 DIAGNOSIS — K5521 Angiodysplasia of colon with hemorrhage: Secondary | ICD-10-CM

## 2022-11-15 DIAGNOSIS — J9621 Acute and chronic respiratory failure with hypoxia: Secondary | ICD-10-CM | POA: Diagnosis not present

## 2022-11-15 DIAGNOSIS — Z923 Personal history of irradiation: Secondary | ICD-10-CM

## 2022-11-15 DIAGNOSIS — K625 Hemorrhage of anus and rectum: Secondary | ICD-10-CM

## 2022-11-15 LAB — BASIC METABOLIC PANEL
Anion gap: 6 (ref 5–15)
BUN: 13 mg/dL (ref 8–23)
CO2: 43 mmol/L — ABNORMAL HIGH (ref 22–32)
Calcium: 8.7 mg/dL — ABNORMAL LOW (ref 8.9–10.3)
Chloride: 90 mmol/L — ABNORMAL LOW (ref 98–111)
Creatinine, Ser: 1.22 mg/dL (ref 0.61–1.24)
GFR, Estimated: >60 mL/min (ref >60)
Glucose, Bld: 107 mg/dL — ABNORMAL HIGH (ref 70–99)
Potassium: 3.5 mmol/L (ref 3.5–5.1)
Sodium: 139 mmol/L (ref 135–145)

## 2022-11-15 LAB — PH, BODY FLUID: pH, Body Fluid: 7.2

## 2022-11-15 LAB — TYPE AND SCREEN
ABO/RH(D): A POS
Antibody Screen: NEGATIVE
Unit division: 0
Unit division: 0

## 2022-11-15 LAB — BPAM RBC
Blood Product Expiration Date: 202402272359
Blood Product Expiration Date: 202402272359
ISSUE DATE / TIME: 202401311706
ISSUE DATE / TIME: 202402010106
Unit Type and Rh: 6200
Unit Type and Rh: 6200

## 2022-11-15 LAB — CBC
HCT: 36.8 % — ABNORMAL LOW (ref 39.0–52.0)
Hemoglobin: 10.2 g/dL — ABNORMAL LOW (ref 13.0–17.0)
MCH: 22.8 pg — ABNORMAL LOW (ref 26.0–34.0)
MCHC: 27.7 g/dL — ABNORMAL LOW (ref 30.0–36.0)
MCV: 82.3 fL (ref 80.0–100.0)
Platelets: 244 10*3/uL (ref 150–400)
RBC: 4.47 MIL/uL (ref 4.22–5.81)
RDW: 17.2 % — ABNORMAL HIGH (ref 11.5–15.5)
WBC: 7.1 10*3/uL (ref 4.0–10.5)
nRBC: 0 % (ref 0.0–0.2)

## 2022-11-15 LAB — BRAIN NATRIURETIC PEPTIDE: B Natriuretic Peptide: 183 pg/mL — ABNORMAL HIGH (ref 0.0–100.0)

## 2022-11-15 LAB — GLUCOSE, CAPILLARY: Glucose-Capillary: 128 mg/dL — ABNORMAL HIGH (ref 70–99)

## 2022-11-15 SURGERY — IMAGING PROCEDURE, GI TRACT, INTRALUMINAL, VIA CAPSULE

## 2022-11-15 MED ORDER — FUROSEMIDE 40 MG PO TABS: 40.0000 mg | ORAL_TABLET | Freq: Every day | ORAL | 1 refills | Status: DC

## 2022-11-15 MED ORDER — FERROUS SULFATE 325 (65 FE) MG PO TABS: 325.0000 mg | ORAL_TABLET | Freq: Three times a day (TID) | ORAL | 2 refills | Status: AC

## 2022-11-15 MED ORDER — SENNOSIDES-DOCUSATE SODIUM 8.6-50 MG PO TABS
2.0000 | ORAL_TABLET | Freq: Two times a day (BID) | ORAL | Status: DC
  Filled 2022-11-15: qty 2

## 2022-11-15 MED ORDER — BISOPROLOL FUMARATE 5 MG PO TABS: 5.0000 mg | ORAL_TABLET | Freq: Every day | ORAL | 0 refills | Status: DC

## 2022-11-15 MED ORDER — SPIRONOLACTONE 25 MG PO TABS: 25.0000 mg | ORAL_TABLET | Freq: Two times a day (BID) | ORAL | 1 refills | Status: DC

## 2022-11-15 NOTE — Op Note (Addendum)
Small Bowel [place] Capsule Study Procedure date:  11/14/2022  PCP:  Dr. Glennon Mac, Hulen Shouts, PA-C  Indication for procedure:  72 year old male with intermittent rectal bleeding for 5-6 years since radiation for prostate cancer. Seen in 2022 for blood in stools and colonoscopy recommended but not performed. Had hospitalization in October 2023 and December 2023 for shortness of breath and near syncope/lightheadedness with evidence of ongoing anemia. Most recent hospitalization in December he had EGD and Colonoscopy with removal of polyps in the cecum, ascending, transverse, descending, and sigmoid colon (8 total) along with left sided diverticulosis; and normal EGD. Was originally scheduled for outpatient capsule study however he represented to the ED with worsening shortness of breath and Hgb 7.6 which was a drop from 8.2 2 weeks prior. He was transfused 2 units prbc so decision made to complete capsule study inpatient.   Patient data:  Wt: 99.2kg Ht: 5'8"  Findings:Complete to the cecum. Moderate sized angiodysplastic lesion at 00:35:52. Melanotic stool just into the cecum and evidence of actively oozing mucosa at 03:30:20, 03:35:21, 03:37:36, 03:42:03. Study ended at 04:32:43. See pertinent images below.                First Gastric image:  00:00:17 First Duodenal image: 00:12:57 First Ileo-Cecal Valve image: 03:16:05 First Cecal image: 03:17:56 Gastric Passage time: 0h 52mSmall Bowel Passage time:  3h 449mSummary & Recommendations: [age]ear old male with history of cirrhosis, prostate cancer s/p radiation, HLD, HTN, COPD, OSA, and anemia who presented to the hospital with symptomatic anemia with previous negative colonoscopy and EGD. Capsule study performed to complete workup and identify possible etiology for worsening anemia. Moderate sized angiodysplastic in the proximal small bowel. Study complete to the cecum with evidence of melena and active bleeding to the cecal mucosa about 03:30  - 03:42. See pertinent images. Discussed and reviewed findings with DaHarvel QualeMD.   Continue oral iron daily.  Hgb 1 week after discharge. Continue PPI daily. Need to further evaluate cecal bleeding with repeat push enteroscopy and colonoscopy if patient agreeable. Can be arranged in near future as outpatient. Will discuss with patient and order accordingly.   CoVenetia NightMSN, APRN, FNP-BC, AGACNP-BC [facility] have reviewed the note and agree with the APP's assessment as described in this progress note  Discussed with patient above findings. Will proceed with push enteroscopy and colonoscopy in the next 1 to 2 weeks.  DaMaylon PeppersMD Gastroenterology and Hepatology [facility]

## 2022-11-15 NOTE — Telephone Encounter (Signed)
Please arrange URGENT push enteroscopy and colonoscopy with Dr. Jenetta Downer in 1-2 weeks.  ASA 3.  -Low volume prep (per Dr. Jenetta Downer) -Hold iron for 5 days -Hold glimepiride night prior to and morning of procedure  Venetia Night, MSN, APRN, FNP-BC, AGACNP-BC [facility] Gastroenterology at [facility]

## 2022-11-15 NOTE — TOC Transition Note (Signed)
Transition of Care [facility]) - CM/SW Discharge Note   Patient Details  Name: Mark Cannon MRN: [id number] Date of Birth: 1950-11-05  [facility] CM/SW Contact:  Ihor Gully, LCSW Phone Number: 11/15/2022, 1:52 PM   Clinical Narrative:    Patient recommended for [facility] services. Discussed providers referred to [facility] for [facility]. Patient lives with wife. Uses walker "if I have to," drives, does not follow [place] diet, and weighs about every other day.  TOC signing off.    Final next level of care: [place] Barriers to Discharge: No Barriers Identified   Patient Goals and CMS Choice      Discharge Placement                         Discharge Plan and Services Additional resources added to the After Visit Summary for                            [facility] Arranged: PT, Social Work, English as a second language teacher Therapy HH Agency: [place] Date [place]: 11/15/22 Time [place]: 1350 Representative spoke with at [place]: [place] ([place]) Interventions SDOH Screenings   Food Insecurity: No Food Insecurity (11/13/2022)  Housing: Low Risk  (11/13/2022)  Transportation Needs: No Transportation Needs (11/13/2022)  Utilities: Not At Risk (11/13/2022)  Tobacco Use: Medium Risk (11/14/2022)     Readmission Risk Interventions     No data to display

## 2022-11-15 NOTE — Discharge Summary (Signed)
Physician Discharge Summary   Patient: Mark Cannon MRN: [id number] DOB: [birth year]  Admit date:     11/13/2022  Discharge date: 11/15/22  Discharge Physician: Deatra Keyaan   PCP: Jake Samples, PA-C   Recommendations at discharge:   Gastroenterologist in next 2-3 days-retrieving capsule endoscopy and results Labs CBC, BMP in 1 week results to PCP Monitor daily weight, continue supplemental oxygen (continuous 4 L by nasal cannula) Home health, PT OT, Follow-up with pulmonologist -results of pleural fluid  Discharge Diagnoses: Principal Problem:   Acute on chronic respiratory failure with hypoxia ([place]) Active Problems:   Acute heart failure with preserved ejection fraction (HFpEF) (HCC)   Prostate cancer (HCC)   Lumbar stenosis with neurogenic claudication   Rectal bleeding   Stroke (cerebrum) (HCC)   COPD with acute exacerbation (HCC)   Essential hypertension   GERD (gastroesophageal reflux disease)   Iron deficiency anemia   Class 2 obesity   Acute on chronic respiratory failure ([place])  Resolved Problems:   * No resolved hospital problems. *  Hospital Course: KIRBY CORTESE  is a 72 year old male with h/o HTN, HLD,   prostate cancer (S/P prostatectomy), OSA, CKD stage III, chronic respiratory failure on 4 L, tobacco abuse in remission presenting with progressive shortness of breath over past 24 hours. Baseline O2 demand 4 L, up to 6 at home and now up to 10 L of oxygen.  Reporting similar presentation with hypoxia and anemia in the past 2 hospitalization October 2023 and October 15, 2022... He was subsequently discharged on 4 L of oxygen In October he was transfused with 1 unit of PRBC, back in January he was transfused with 2 units of PRBC.   GI workup was deferred to the outpatient setting.  Colonoscopy was planned given his history of iron deficiency anemia.  Subsequently was canceled. He has not had any follow-up since.   He presents again with generalized  weakness and dyspnea.  He denies any hematochezia, melena, hematemesis, hematuria.  He has not had any fevers, chills, chest pain, nausea, vomiting, diarrhea, abdominal pain.  He denies any NSAIDs.    ED evaluation: Blood pressure (!) 147/60, pulse 95, Temp. 98.1 F (36.7 C), RR 15, SpO2 100 % on 10 L of oxygen HFNC   WBC 7.9, hemoglobin 7.6, hematocrit 28.5, calcium 8.6, troponin 19, glucose 183, BNP 183.0  Hemoccult positive X 2     Respiratory panel : COVID, RSV, Flu were all negative CTA: Negative for pulmonary embolism, pleural effusion, fluid overload.   He was started on IV Lasix     Assessment and Plan:  Acute on chronic respiratory failure with hypoxia -Much improved -due to fluid overload/pleural effusion -Status post thoracentesis yielding 900 mL, IV Lasix -Check COVID, RSV, Flu--all negative -CTA: Bilateral pleural effusion, congestion,, negative for pulmonary embolism -at [facility] line O2  4L a -Now on 10 L HFNC>> down back to baseline of 4 L of oxygen, satting 93%  -Continue as needed DuoNeb bronchodilator treatments   Bilateral pleural effusion  -Status post thoracentesis from the right, yielding 900 mL fluid, pending fluid cytology and analysis _Pleural fluid cloudy, monocytes of 35, glucose 145, LD 67, lymphocyte of 51, neutrophil of 14, (Thus far not consistent with infection)  -Follow-up with pulmonology - Dr. Melvyn Novas for further evaluation recommendations  symptomatic anemia -Patient has chronic blood loss -Hemoglobin 8.4 at the time of his discharge on 10/15/2022 - Hemoccult positive X 2    Latest Ref Rng &  Units 11/14/2022    7:06 AM 11/13/2022   11:36 AM 10/25/2022    2:26 PM  CBC  WBC 4.0 - 10.5 K/uL 7.7  7.9  7.4   Hemoglobin 13.0 - 17.0 g/dL 9.5  7.6  8.2   Hematocrit 39.0 - 52.0 % 33.2  28.5  28.0   Platelets 150 - 400 K/uL 237  249  413      -ferritin 8, iron sat 23% -GI consult appreciated  -Status post transfusion of 2U PRBC  (On Last  admission was also transfused with 2U PRBC -pantoprazole I -10/15/22 EGD--normal -10/15/22 colonoscopy--8 polyps removed. +diverticulosis -GI consulted following,  -Capsule endoscopy 11/14/2022 -yet to be retrieved -to be retrieved, Per GI outpatient follow-up capsule equipment can be returned to endoscopy     Acute HFpEF -started on IV lasix >>> will be discontinued today 2-24 -05/15/22 Echo--EF 60-65%, G1DD, no WMA, normal RVF -10/14/22 ReDS = 36  Intake/Output Summary (Last 24 hours) at 11/15/2022 1145 Last data filed at 11/15/2022 0600 Gross per 24 hour  Intake 525.35 ml  Output 1000 ml  Net -474.65 ml   Weight change: -3.313 kg Filed Weights   11/13/22 2303 11/14/22 0500 11/15/22 0519  Weight: 101.1 kg 101.6 kg 99.2 kg      -Resuming home with lasix 40 mg po daily   CKD stage IIIa -Baseline creatinine 1.1-1.4 -monitor with diuresis -serum creatinine 1.103 >> 0.95 >> 1.05,   Pulmonary nodular infiltrates -12/31 CT chest--RUL mass like consolidations -CTA: No mention of nodules,Bilateral pleural effusion, congestion,, negative for pulmonary embolism    Uncontrolled diabetes mellitus type 2 with hyperglycemia -Resume  Amaryl-- -05/14/2022 A1c 7.7 -12/30 Repeat A1c--7.3 - SSI    Iron deficiency Anemia -iron sat 23 -ferritin 8 -Cont.  ferrous sulfate   Essential hypertension -restarted amlodipine 5 mg daily   COPD -stable -he is not on any maintenance medications at home -Quit smoking in September 2023 after 50 pack years   History of stroke -Intolerant to statin -On Aspirin    GERD -Continue pantoprazole   Hyperlipidemia -Intolerant to statin   Class II obesity -Lifestyle modification Body mass index is 34.06 kg/m.    OSA Not on CPAP secondary to intolerance.     Consultants: Pulmonologist Procedures performed: Capsule endoscopy/blood transfusion/ thoracentesis Disposition: Home health Diet recommendation:  Discharge Diet Orders (From admission,  onward)     Start     Ordered   11/15/22 0000  Diet - low sodium heart healthy        11/15/22 1140           Cardiac diet DISCHARGE MEDICATION: Allergies as of 11/15/2022       Reactions   Lidocaine Hives   Benzocaine Other (See Comments)   Blisters    Other    Novocaine - blisters in mouth        Medication List     TAKE these medications    amLODipine 5 MG tablet Commonly known as: NORVASC Take 1 tablet (5 mg total) by mouth daily. What changed: how much to take   aspirin EC 81 MG tablet Take 1 tablet (81 mg total) by mouth daily. Swallow whole.   bisoprolol 5 MG tablet Commonly known as: ZEBETA Take 1 tablet (5 mg total) by mouth daily. Start taking on: November 16, 2022   docusate sodium 100 MG capsule Commonly known as: COLACE Take 100 mg by mouth daily.   ferrous sulfate 325 (65 FE) MG tablet Take 1 tablet (  325 mg total) by mouth 3 (three) times daily with meals. What changed: when to take this   furosemide 40 MG tablet Commonly known as: LASIX Take 1 tablet (40 mg total) by mouth daily. What changed: additional instructions   glimepiride 1 MG tablet Commonly known as: AMARYL Take 1 mg by mouth 2 (two) times daily.   ipratropium-albuterol 0.5-2.5 (3) MG/3ML Soln Commonly known as: DUONEB Take 3 mLs by nebulization every 6 (six) hours as needed (SOB/wheezing.). What changed: when to take this   OXYGEN Inhale 4 L into the lungs continuous.   pantoprazole 40 MG tablet Commonly known as: PROTONIX Take 1 tablet (40 mg total) by mouth daily.   PRESCRIPTION MEDICATION Hormone therapy injection every 6 months through [facility] due to prostate cancer.   spironolactone 25 MG tablet Commonly known as: ALDACTONE Take 1 tablet (25 mg total) by mouth 2 (two) times daily.        Discharge Exam: Filed Weights   11/13/22 2303 11/14/22 0500 11/15/22 0519  Weight: 101.1 kg 101.6 kg 99.2 kg   General:  AAO x 3,  cooperative, no distress;    HEENT:  Normocephalic, PERRL, otherwise with in Normal limits   Neuro:  CNII-XII intact. , normal motor and sensation, reflexes intact   Lungs:   Clear to auscultation BL, Respirations unlabored,  No wheezes / crackles  Cardio:    S1/S2, RRR, No murmure, No Rubs or Gallops   Abdomen:  Soft, non-tender, bowel sounds active all four quadrants, no guarding or peritoneal signs.  Muscular  skeletal:  Limited exam -global generalized weaknesses - in bed, able to move all 4 extremities,   2+ pulses,  symmetric, No pitting edema  Skin:  Dry, warm to touch, negative for any Rashes,  Wounds: Please see nursing documentation     Condition at discharge: good  The results of significant diagnostics from this hospitalization (including imaging, microbiology, ancillary and laboratory) are listed below for reference.   Imaging Studies: DG Chest 1 View  Result Date: 11/13/2022 CLINICAL DATA:  RIGHT pleural effusion post thoracentesis EXAM: CHEST  1 VIEW COMPARISON:  Repeat exam 1544 hours compared to 1159 hours FINDINGS: Enlargement of cardiac silhouette. Mediastinal contours and pulmonary vascularity normal. Decreased RIGHT pleural effusion and basilar atelectasis. No pneumothorax following thoracentesis. Persistent small LEFT pleural effusion and mild LEFT basilar atelectasis. Prior cervical spine fusion. IMPRESSION: No pneumothorax following RIGHT thoracentesis. Electronically Signed   By: Lavonia Dana M.D.   On: 11/13/2022 16:07   US THORACENTESIS ASP PLEURAL SPACE W/IMG GUIDE  Result Date: 11/13/2022 INDICATION: Bilateral pleural effusions Right greater than left EXAM: ULTRASOUND GUIDED Right THORACENTESIS MEDICATIONS: 10 cc 1% lidocaine. COMPLICATIONS: None immediate. PROCEDURE: An ultrasound guided thoracentesis was thoroughly discussed with the patient and questions answered. The benefits, risks, alternatives and complications were also discussed. The patient understands and wishes to proceed with  the procedure. Written consent was obtained. Ultrasound was performed to localize and mark an adequate pocket of fluid in the right chest. The area was then prepped and draped in the normal sterile fashion. 1% Lidocaine was used for local anesthesia. Under ultrasound guidance a 7 cm Yueh catheter was introduced. Thoracentesis was performed. The catheter was removed and a dressing applied. FINDINGS: A total of approximately 900 cc of amber fluid was removed. Samples were sent to the laboratory as requested by the clinical team. IMPRESSION: Successful ultrasound guided right thoracentesis yielding 900 cc of pleural fluid. Post procedure CXR: NO PTX per Dr  Boles Read by Lavonia Drafts PAC Electronically Signed   By: Lavonia Dana M.D.   On: 11/13/2022 16:07   CT Angio Chest PE W and/or Wo Contrast  Result Date: 11/13/2022 CLINICAL DATA:  Shortness of breath. EXAM: CT ANGIOGRAPHY CHEST WITH CONTRAST TECHNIQUE: Multidetector CT imaging of the chest was performed using the standard protocol during bolus administration of intravenous contrast. Multiplanar CT image reconstructions and MIPs were obtained to evaluate the vascular anatomy. RADIATION DOSE REDUCTION: This exam was performed according to the departmental dose-optimization program which includes automated exposure control, adjustment of the mA and/or kV according to patient size and/or use of iterative reconstruction technique. CONTRAST:  70m OMNIPAQUE IOHEXOL 350 MG/ML SOLN COMPARISON:  CT of the chest on 10/13/2022 and CTA of the chest on 08/08/2022 FINDINGS: Cardiovascular: Adequate opacification of pulmonary arteries to the subsegmental level. No evidence of pulmonary embolism. Dilated central pulmonary arteries with the main pulmonary artery measuring 3.2 cm. Mild cardiac enlargement. Atherosclerosis of thoracic aorta without aneurysm. Calcified coronary artery plaque present. Calcified aortic valve. No pericardial fluid identified. Reflux of contrast  into the intrahepatic IVC and hepatic veins is consistent with some degree right heart failure/elevated right heart pressures. Mediastinum/Nodes: No enlarged mediastinal, hilar, or axillary lymph nodes. Thyroid gland, trachea, and esophagus demonstrate no significant findings. Lungs/Pleura: Right lung pneumonia seen previously has resolved. There is chronic atelectasis in the lingula. Bilateral pleural effusions present, right greater than left with moderate volume on the right and small volume on the left. Lungs demonstrate no overt pulmonary edema. There is atelectasis at the right base likely reflecting compressive atelectasis from pleural fluid. No pneumothorax. Upper Abdomen: Stable evidence of diffuse hepatic steatosis and probable early cirrhotic morphology of the liver. Status post cholecystectomy. Musculoskeletal: Stable angioma of the L1 vertebral body. No fractures. Review of the MIP images confirms the above findings. IMPRESSION: 1. No evidence of pulmonary embolism. 2. Dilated central pulmonary arteries consistent with pulmonary hypertension. 3. Bilateral pleural effusions, right greater than left with moderate volume on the right and small volume on the left. Associated compressive atelectasis at the right lung base. 4. Stable hepatic steatosis and probable early cirrhotic morphology of the liver. 5. Aortic atherosclerosis and coronary atherosclerosis. 6. Calcified aortic valve. 7. Right heart failure with reflux of contrast into the IVC and hepatic veins. Aortic Atherosclerosis (ICD10-I70.0). Electronically Signed   By: GAletta EdouardM.D.   On: 11/13/2022 13:55   DG Chest Port 1 View  Result Date: 11/13/2022 CLINICAL DATA:  Shortness of breath, chest congestion EXAM: PORTABLE CHEST 1 VIEW COMPARISON:  10/12/2022 FINDINGS: Small left pleural effusion. Bibasilar atelectasis or infiltrates. Heart is normal size. Mediastinal contours within normal limits. No acute bony abnormality. IMPRESSION: Small  left pleural effusion.  Bibasilar atelectasis or infiltrates. Electronically Signed   By: KRolm BaptiseM.D.   On: 11/13/2022 12:07    Microbiology: Results for orders placed or performed during the hospital encounter of 11/13/22  Resp panel by RT-PCR (RSV, Flu A&B, Covid) Anterior Nasal Swab     Status: None   Collection Time: 11/13/22 11:48 AM   Specimen: Anterior Nasal Swab  Result Value Ref Range Status   SARS Coronavirus 2 by RT PCR NEGATIVE NEGATIVE Final    Comment: (NOTE) SARS-CoV-2 target nucleic acids are NOT DETECTED.  The SARS-CoV-2 RNA is generally detectable in upper respiratory specimens during the acute phase of infection. The lowest concentration of SARS-CoV-2 viral copies this assay can detect is 138 copies/mL. A negative  result does not preclude SARS-Cov-2 infection and should not be used as the sole basis for treatment or other patient management decisions. A negative result may occur with  improper specimen collection/handling, submission of specimen other than nasopharyngeal swab, presence of viral mutation(s) within the areas targeted by this assay, and inadequate number of viral copies(<138 copies/mL). A negative result must be combined with clinical observations, patient history, and epidemiological information. The expected result is Negative.  Fact Sheet for Patients:  EntrepreneurPulse.com.au  Fact Sheet for Healthcare Providers:  IncredibleEmployment.be  This test is no t yet approved or cleared by the [place] FDA and  has been authorized for detection and/or diagnosis of SARS-CoV-2 by FDA under an Emergency Use Authorization (EUA). This EUA will remain  in effect (meaning this test can be used) for the duration of the COVID-19 declaration under Section 564(b)(1) of the Act, 21 U.S.C.section 360bbb-3(b)(1), unless the authorization is terminated  or revoked sooner.       Influenza A by PCR NEGATIVE NEGATIVE  Final   Influenza B by PCR NEGATIVE NEGATIVE Final    Comment: (NOTE) The Xpert Xpress SARS-CoV-2/FLU/RSV plus assay is intended as an aid in the diagnosis of influenza from Nasopharyngeal swab specimens and should not be used as a sole basis for treatment. Nasal washings and aspirates are unacceptable for Xpert Xpress SARS-CoV-2/FLU/RSV testing.  Fact Sheet for Patients: EntrepreneurPulse.com.au  Fact Sheet for Healthcare Providers: IncredibleEmployment.be  This test is not yet approved or cleared by the [place] FDA and has been authorized for detection and/or diagnosis of SARS-CoV-2 by FDA under an Emergency Use Authorization (EUA). This EUA will remain in effect (meaning this test can be used) for the duration of the COVID-19 declaration under Section 564(b)(1) of the Act, 21 U.S.C. section 360bbb-3(b)(1), unless the authorization is terminated or revoked.     Resp Syncytial Virus by PCR NEGATIVE NEGATIVE Final    Comment: (NOTE) Fact Sheet for Patients: EntrepreneurPulse.com.au  Fact Sheet for Healthcare Providers: IncredibleEmployment.be  This test is not yet approved or cleared by the [place] FDA and has been authorized for detection and/or diagnosis of SARS-CoV-2 by FDA under an Emergency Use Authorization (EUA). This EUA will remain in effect (meaning this test can be used) for the duration of the COVID-19 declaration under Section 564(b)(1) of the Act, 21 U.S.C. section 360bbb-3(b)(1), unless the authorization is terminated or revoked.  Performed at [facility], [address] [place], [place] [address]   Gram stain     Status: None   Collection Time: 11/13/22  3:30 PM   Specimen: Pleura  Result Value Ref Range Status   Specimen Description PLEURAL  Final   Special Requests NONE  Final   Gram Stain   Final    WBC PRESENT, PREDOMINANTLY MONONUCLEAR NO ORGANISMS SEEN CYTOSPIN  SMEAR Performed at [facility], [address] [place], [place] [address]    Report Status 11/13/2022 FINAL  Final  Culture, body fluid w Gram Stain-bottle     Status: None (Preliminary result)   Collection Time: 11/13/22  3:30 PM   Specimen: Pleura  Result Value Ref Range Status   Specimen Description PLEURAL  Final   Special Requests   Final    BOTTLES DRAWN AEROBIC AND ANAEROBIC Blood Culture adequate volume   Culture   Final    NO GROWTH 2 DAYS Performed at [facility], [address] [place], [place] [address]    Report Status PENDING  Incomplete  MRSA Next Gen  by PCR, Nasal     Status: None   Collection Time: 11/14/22 12:00 AM   Specimen: Nasal Mucosa; Nasal Swab  Result Value Ref Range Status   MRSA by PCR Next Gen NOT DETECTED NOT DETECTED Final    Comment: (NOTE) The GeneXpert MRSA Assay (FDA approved for NASAL specimens only), is one component of a comprehensive MRSA colonization surveillance program. It is not intended to diagnose MRSA infection nor to guide or monitor treatment for MRSA infections. Test performance is not FDA approved in patients less than 23 years old. Performed at [facility], [address] [place], [place] [address]     Labs: CBC: Recent Labs  Lab 11/13/22 1136 11/14/22 0706 11/15/22 0440  WBC 7.9 7.7 7.1  NEUTROABS 6.6  --   --   HGB 7.6* 9.5* 10.2*  HCT 28.5* 33.2* 36.8*  MCV 82.8 81.0 82.3  PLT 249 237 166   Basic Metabolic Panel: Recent Labs  Lab 11/13/22 1136 11/14/22 0706 11/15/22 0440  NA 138 139 139  K 4.3 3.7 3.5  CL 90* 87* 90*  CO2 40* 43* 43*  GLUCOSE 183* 96 107*  BUN '10 9 13  '$ CREATININE 0.95 1.05 1.22  CALCIUM 8.6* 8.5* 8.7*  MG 2.3  --   --   PHOS 3.4  --   --    Liver Function Tests: No results for input(s): "AST", "ALT", "ALKPHOS", "BILITOT", "PROT", "ALBUMIN" in the last 168 hours. CBG: Recent Labs  Lab 11/14/22 0757 11/15/22 0733  GLUCAP 97 128*    Discharge time spent: greater than 50  minutes.  Signed: Deatra Houston, MD [facility] 11/15/2022

## 2022-11-15 NOTE — Care Management Important Message (Signed)
Important Message  Patient Details  Name: Mark Cannon MRN: [id number] Date of Birth: October 30, 1950   Medicare Important Message Given:  Yes     Tommy Medal 11/15/2022, 11:02 AM

## 2022-11-17 DIAGNOSIS — G4733 Obstructive sleep apnea (adult) (pediatric): Secondary | ICD-10-CM | POA: Diagnosis not present

## 2022-11-17 DIAGNOSIS — Z7984 Long term (current) use of oral hypoglycemic drugs: Secondary | ICD-10-CM | POA: Diagnosis not present

## 2022-11-17 DIAGNOSIS — Z9981 Dependence on supplemental oxygen: Secondary | ICD-10-CM | POA: Diagnosis not present

## 2022-11-17 DIAGNOSIS — J9621 Acute and chronic respiratory failure with hypoxia: Secondary | ICD-10-CM | POA: Diagnosis not present

## 2022-11-17 DIAGNOSIS — K625 Hemorrhage of anus and rectum: Secondary | ICD-10-CM | POA: Diagnosis not present

## 2022-11-17 DIAGNOSIS — Z9181 History of falling: Secondary | ICD-10-CM | POA: Diagnosis not present

## 2022-11-17 DIAGNOSIS — E669 Obesity, unspecified: Secondary | ICD-10-CM | POA: Diagnosis not present

## 2022-11-17 DIAGNOSIS — K219 Gastro-esophageal reflux disease without esophagitis: Secondary | ICD-10-CM | POA: Diagnosis not present

## 2022-11-17 DIAGNOSIS — C61 Malignant neoplasm of prostate: Secondary | ICD-10-CM | POA: Diagnosis not present

## 2022-11-17 DIAGNOSIS — Z6834 Body mass index (BMI) 34.0-34.9, adult: Secondary | ICD-10-CM | POA: Diagnosis not present

## 2022-11-17 DIAGNOSIS — D509 Iron deficiency anemia, unspecified: Secondary | ICD-10-CM | POA: Diagnosis not present

## 2022-11-17 DIAGNOSIS — N1831 Chronic kidney disease, stage 3a: Secondary | ICD-10-CM | POA: Diagnosis not present

## 2022-11-17 DIAGNOSIS — E785 Hyperlipidemia, unspecified: Secondary | ICD-10-CM | POA: Diagnosis not present

## 2022-11-17 DIAGNOSIS — Z87891 Personal history of nicotine dependence: Secondary | ICD-10-CM | POA: Diagnosis not present

## 2022-11-17 DIAGNOSIS — E1165 Type 2 diabetes mellitus with hyperglycemia: Secondary | ICD-10-CM | POA: Diagnosis not present

## 2022-11-17 DIAGNOSIS — I5031 Acute diastolic (congestive) heart failure: Secondary | ICD-10-CM | POA: Diagnosis not present

## 2022-11-17 DIAGNOSIS — M48062 Spinal stenosis, lumbar region with neurogenic claudication: Secondary | ICD-10-CM | POA: Diagnosis not present

## 2022-11-17 DIAGNOSIS — I130 Hypertensive heart and chronic kidney disease with heart failure and stage 1 through stage 4 chronic kidney disease, or unspecified chronic kidney disease: Secondary | ICD-10-CM | POA: Diagnosis not present

## 2022-11-17 DIAGNOSIS — Z7982 Long term (current) use of aspirin: Secondary | ICD-10-CM | POA: Diagnosis not present

## 2022-11-17 DIAGNOSIS — E1122 Type 2 diabetes mellitus with diabetic chronic kidney disease: Secondary | ICD-10-CM | POA: Diagnosis not present

## 2022-11-17 DIAGNOSIS — J441 Chronic obstructive pulmonary disease with (acute) exacerbation: Secondary | ICD-10-CM | POA: Diagnosis not present

## 2022-11-17 DIAGNOSIS — Z8673 Personal history of transient ischemic attack (TIA), and cerebral infarction without residual deficits: Secondary | ICD-10-CM | POA: Diagnosis not present

## 2022-11-17 NOTE — Progress Notes (Deleted)
Mark Cannon, male    DOB: 06-Aug-1951    MRN: [id number]   Brief patient profile:  17  yowm  active smoker / wife is RN  referred to pulmonary clinic in [place]  08/08/2022 by [facility]  for f/u pna and 02 dependence. Had previously eval by [name] with dx of COPD but never pfts nor need for resp meds though very sedentary due to back pain     Admit date:     06/27/2022  Discharge date: 06/30/22       Discharge Diagnoses:     Pneumonia   Prostate cancer [facility])   Stroke (cerebrum) ([place])   COPD with acute exacerbation ([place])   Acute respiratory failure with hypoxia [facility])     Hospital Course: Mark Cannon is a 72 y.o. male with a history of hypertension, hyperlipidemia, OSA, CKD stage IIIa, tobacco use, lumbar stenosis, prostate cancer s/p prostatectomy, CVA. Patient presented secondary to shortness of breath and was found to have evidence of pneumonia and COPD exacerbation in addition to hypoxia. Ceftriaxone/azithromycin, prednisone and supplemental oxygen started. Patient to continue antibiotics and prednisone. Discharge on oxygen. Recommendation for pulmonology follow-up.   Assessment and Plan:   Acute respiratory failure with hypoxia Patient is officially on room air, however it appears he may have qualified for chronic oxygen use on previous admission. Current hypoxia in setting of pneumonia and COPD exacerbation. Requiring 4 L/min with ambulation; outpatient oxygen set up for discharge.   Community acquired pneumonia Chest x-ray significant for left lingular opacity with concern for pneumonia. Patient started on Ceftriaxone and azithromycin for empiric treatment. Patient transitioned to Cefdinir and azithromycin to complete course.   COPD exacerbation Unsure of etiology for exacerbation. Associated sputum production. Patient started on steroids in addition to antibiotics as mentioned above. Patient discharged to complete prednisone burst. New prescription for Duoneb placed.  Antibiotics as mentioned above. Recommendation for outpatient pulmonology follow-up.   History of CVA Associated right central vision loss. Intolerant to statin therapy. Continue aspirin.   Hyperlipidemia Intolerant to statin therapy.   AKI on CKD stage IIIa Baseline creatinine of about 1.5. Creatinine up to a peak of 1.99. Complicated by hypotension and olmesartan/hydrochlorothiazide use as an outpatient. NS IV fluid bolus given on 9/16 with resultant creatinine of 1.48 on discharge. Recommend repeat BMP.   Primary hypertension Patient is on hydrochlorothiazide, olmesartan and amlodipine as an outpatient. Hydrochlorothiazide and olmesartan held secondary to AKI. Recommendation to hold on discharge.   Elevated troponin Mildly elevated. Not consistent with ACS.   Tobacco use Patient counseled on cessation.   OSA Not on CPAP secondary to intolerance.   Obesity   Height as of this encounter: '5\' 8"'$  (1.727 m).   Weight as of this encounter: 106.6 kg.    History of Present Illness  08/08/2022  Pulmonary/ 1st office eval/ Abbygael Curtiss / [place] Office  Chief Complaint  Patient presents with   Consult    Copd exacerbation and pneumonia of LL lobe  Uses 3LO2 cont 24/7   Dyspnea:  back to baseline but 02 at 3 (was 4 lpm)  Cough: none  Sleep: flat  SABA use: has duoneb not using  02 3lpm  Presyncope since d/c  Chronic L > R leg swelling no change  Rec Go to ER:  Admit date:     08/08/2022  Discharge date: 08/09/22   Discharge Diagnoses: Principal Problem:   Symptomatic anemia   Acute respiratory failure with hypoxia (HCC)   Essential hypertension  GERD (gastroesophageal reflux disease)   Iron deficiency anemia   Brief Hospital Course: Mark Cannon is a 72 y.o. male with medical history significant of arthritis, cancer, GERD, hyperlipidemia, hypertension presents to the ED with a chief complaint of difficulty breathing.  Patient reports that has been gradually in onset  over the last 2 weeks.  The dyspnea is exertional.  His oxygen sats dropped down to 75% while he is exerting himself.  Sitting down situating his oxygen and trying to rest makes it better.  He reports the lowest oxygen setting he is seen is 65% that was just prior to his previous hospitalization.  Patient reports no fevers, no cough.  He is wearing 4 L nasal cannula at baseline at home.  He reports he is not using his nebulizer because he ran out of medication for it.  He does not have any chest pains.  He does have some dizziness.  He has had no loss of consciousness.  No change in vision.  He reports he does have a ringing in his ear sometimes.  Patient reports that he did quit smoking last month.  He has orthopnea at home that he reports is resolved but laying on his left or right side.  He reports no peripheral edema.  Patient reports a family history with his mother dying of "low hemoglobin."  According to patient, mother had been anemic for the last couple years of her life and had to have recurrent transfusions and there was never any etiology identified.   Patient does not smoke, does not drink, does not use illicit drugs.  He is vaccinated for COVID.  Patient is full code.   Assessment and Plan: * Symptomatic anemia -Hemoglobin down to 7.6 - Latest hemoglobin about 1 month ago around 11 - Excellent response to 1 unit PRBC transfusion during hospitalization -At discharge hemoglobin 8.9  -No signs of overt bleeding appreciated, patient's symptoms completely improved/resolved. -Case discussed with gastroenterology who recommended outpatient work-up for anemia and endoscopic evaluation if needed.   -Continue PPI and avoid any NSAIDs outside the use of baby enteric-coated aspirin.   -IV iron x1 also provided while inpatient as anemia panel demonstrated component of iron deficiency.   Obesity, Class I, BMI 30-34.9 -Body mass index is 34.86 kg/m. -Low-calorie diet, portion control and increase  physical activity discussed with patient.   Iron deficiency anemia - Anemia panel demonstrating component of iron deficiency anemia -Endoscopic evaluation GI work-up for anemia to be pursued as an outpatient -Continue PPI -Fecal occult blood test negative -Patient hemodynamically stable.   GERD (gastroesophageal reflux disease) -Continue Protonix -Lifestyle changes discussed with patient.   Essential hypertension -Resume home antihypertensive regimen. -Heart healthy diet discussed with patient.   Acute respiratory failure with hypoxia (HCC) -On oxygen at home since last month -Stable saturation appreciated -Continue 4 L nasal cannula supplementation -Continue as needed bronchodilators -Very likely component of hypoxia unsure when the sensation driven by anemia. -Patient feeling ready to go home currently. -Outpatient follow-up with pulmonologist recommended. -No acute abnormalities appreciated on chest images.     Admit date:     11/13/2022  Discharge date: 11/15/22  Discharge Physician: Deatra Jermond    PCP: Mark Samples, PA-C    Recommendations at discharge:    Gastroenterologist in next 2-3 days-retrieving capsule endoscopy and results Labs CBC, BMP in 1 week results to PCP Monitor daily weight, continue supplemental oxygen (continuous 4 L by nasal cannula) Home health, PT OT, Follow-up with  pulmonologist -results of pleural fluid   Discharge Diagnoses: Principal Problem:   Acute on chronic respiratory failure with hypoxia ([place]) Active Problems:   Acute heart failure with preserved ejection fraction (HFpEF) (HCC)   Prostate cancer (HCC)   Lumbar stenosis with neurogenic claudication   Rectal bleeding   Stroke (cerebrum) (HCC)   COPD with acute exacerbation (HCC)   Essential hypertension   GERD (gastroesophageal reflux disease)   Iron deficiency anemia   Class 2 obesity   Acute on chronic respiratory failure ([place])   Resolved Problems:   * No  resolved hospital problems. *   Hospital Course: Mark Cannon  is a 72 year old male with h/o HTN, HLD,   prostate cancer (S/P prostatectomy), OSA, CKD stage III, chronic respiratory failure on 4 L, tobacco abuse in remission presenting with progressive shortness of breath over past 24 hours. Baseline O2 demand 4 L, up to 6 at home and now up to 10 L of oxygen.   Reporting similar presentation with hypoxia and anemia in the past 2 hospitalization October 2023 and October 15, 2022... He was subsequently discharged on 4 L of oxygen In October he was transfused with 1 unit of PRBC, back in January he was transfused with 2 units of PRBC.    GI workup was deferred to the outpatient setting.  Colonoscopy was planned given his history of iron deficiency anemia.  Subsequently was canceled. He has not had any follow-up since.    He presents again with generalized weakness and dyspnea.  He denies any hematochezia, melena, hematemesis, hematuria.  He has not had any fevers, chills, chest pain, nausea, vomiting, diarrhea, abdominal pain.  He denies any NSAIDs.     ED evaluation: Blood pressure (!) 147/60, pulse 95, Temp. 98.1 F (36.7 C), RR 15, SpO2 100 % on 10 L of oxygen HFNC    WBC 7.9, hemoglobin 7.6, hematocrit 28.5, calcium 8.6, troponin 19, glucose 183, BNP 183.0   Hemoccult positive X 2     Respiratory panel : COVID, RSV, Flu were all negative CTA: Negative for pulmonary embolism, pleural effusion, fluid overload.   He was started on IV Lasix       Assessment and Plan:   Acute on chronic respiratory failure with hypoxia -Much improved -due to fluid overload/pleural effusion -Status post thoracentesis yielding 900 mL, IV Lasix -Check COVID, RSV, Flu--all negative -CTA: Bilateral pleural effusion, congestion,, negative for pulmonary embolism -at [facility] line O2  4L a -Now on 10 L HFNC>> down back to baseline of 4 L of oxygen, satting 93%   -Continue as needed DuoNeb  bronchodilator treatments     Bilateral pleural effusion  -Status post thoracentesis from the right, yielding 900 mL fluid, pending fluid cytology and analysis _Pleural fluid cloudy, monocytes of 35, glucose 145, LD 67, lymphocyte of 51, neutrophil of 14, (Thus far not consistent with infection)  -Follow-up with pulmonology - Dr. Melvyn Novas for further evaluation recommendations   symptomatic anemia -Patient has chronic blood loss -Hemoglobin 8.4 at the time of his discharge on 10/15/2022 - Hemoccult positive X 2     Latest Ref Rng & Units 11/14/2022    7:06 AM 11/13/2022   11:36 AM 10/25/2022    2:26 PM  CBC  WBC 4.0 - 10.5 K/uL 7.7  7.9  7.4   Hemoglobin 13.0 - 17.0 g/dL 9.5  7.6  8.2   Hematocrit 39.0 - 52.0 % 33.2  28.5  28.0   Platelets 150 - 400 K/uL  237  249  413         -ferritin 8, iron sat 23% -GI consult appreciated   -Status post transfusion of 2U PRBC  (On Last admission was also transfused with 2U PRBC -pantoprazole I -10/15/22 EGD--normal -10/15/22 colonoscopy--8 polyps removed. +diverticulosis -GI consulted following,  -Capsule endoscopy 11/14/2022 -yet to be retrieved -to be retrieved, Per GI outpatient follow-up capsule equipment can be returned to endoscopy       Acute HFpEF -started on IV lasix >>> will be discontinued today 2-24 -05/15/22 Echo--EF 60-65%, G1DD, no WMA, normal RVF -10/14/22 ReDS = 36   Intake/Output Summary (Last 24 hours) at 11/15/2022 1145 Last data filed at 11/15/2022 0600    Gross per 24 hour  Intake 525.35 ml  Output 1000 ml  Net -474.65 ml    Weight change: -3.313 kg      Filed Weights    11/13/22 2303 11/14/22 0500 11/15/22 0519  Weight: 101.1 kg 101.6 kg 99.2 kg          -Resuming home with lasix 40 mg po daily   CKD stage IIIa -Baseline creatinine 1.1-1.4 -monitor with diuresis -serum creatinine 1.103 >> 0.95 >> 1.05,   Pulmonary nodular infiltrates -12/31 CT chest--RUL mass like consolidations -CTA: No mention of  nodules,Bilateral pleural effusion, congestion,, negative for pulmonary embolism    Uncontrolled diabetes mellitus type 2 with hyperglycemia -Resume  Amaryl-- -05/14/2022 A1c 7.7 -12/30 Repeat A1c--7.3 - SSI    Iron deficiency Anemia -iron sat 23 -ferritin 8 -Cont.  ferrous sulfate   Essential hypertension -restarted amlodipine 5 mg daily   COPD -stable -he is not on any maintenance medications at home -Quit smoking in September 2023 after 50 pack years     11/19/2022  post hosp f/u ov/Avel Ogawa re: ***   maint on ***  No chief complaint on file.   Dyspnea:  *** Cough: *** Sleeping: *** SABA use: *** 02: *** Covid status:   *** Lung cancer screening :  ***    No obvious day to day or daytime variability or assoc excess/ purulent sputum or mucus plugs or hemoptysis or cp or chest tightness, subjective wheeze or overt sinus or hb symptoms.   *** without nocturnal  or early am exacerbation  of respiratory  c/o's or need for noct saba. Also denies any obvious fluctuation of symptoms with weather or environmental changes or other aggravating or alleviating factors except as outlined above   No unusual exposure hx or h/o childhood pna/ asthma or knowledge of premature birth.  Current Allergies, Complete Past Medical History, Past Surgical History, Family History, and Social History were reviewed in [employer] record.  ROS  The following are not active complaints unless bolded Hoarseness, sore throat, dysphagia, dental problems, itching, sneezing,  nasal congestion or discharge of excess mucus or purulent secretions, ear ache,   fever, chills, sweats, unintended wt loss or wt gain, classically pleuritic or exertional cp,  orthopnea pnd or arm/hand swelling  or leg swelling, presyncope, palpitations, abdominal pain, anorexia, nausea, vomiting, diarrhea  or change in bowel habits or change in bladder habits, change in stools or change in urine, dysuria, hematuria,   rash, arthralgias, visual complaints, headache, numbness, weakness or ataxia or problems with walking or coordination,  change in mood or  memory.        No outpatient medications have been marked as taking for the 11/19/22 encounter (Appointment) with Tanda Rockers, MD.  Past Medical History:  Diagnosis Date   Arthritis    Cancer [facility])    Prostate, has had prostate removed, has had radiation and still has cancer,   Colon polyps    Cough    GERD (gastroesophageal reflux disease)    occasional    H/O tooth extraction week of 03/15/2015    History of kidney stones    Hypercholesteremia    Hypertension    Lumbar stenosis with neurogenic claudication    Pneumonia    Pre-diabetes    Renal disorder    Sleep apnea    No cpap       Objective:     Wt Readings from Last 3 Encounters:  11/15/22 218 lb 11.1 oz (99.2 kg)  10/15/22 238 lb 8 oz (108.2 kg)  09/16/22 231 lb 11.3 oz (105.1 kg)      Vital signs reviewed  11/19/2022  - Note at rest 02 sats  ***% on ***   General appearance:    ***     distant bs  bilaterally***               Assessment

## 2022-11-18 LAB — CULTURE, BODY FLUID W GRAM STAIN -BOTTLE
Culture: NO GROWTH
Special Requests: ADEQUATE

## 2022-11-18 LAB — CYTOLOGY - NON PAP

## 2022-11-18 NOTE — Telephone Encounter (Signed)
LMTCB for pt 

## 2022-11-19 ENCOUNTER — Encounter: Payer: Self-pay | Admitting: *Deleted

## 2022-11-19 ENCOUNTER — Ambulatory Visit: Payer: Medicare Other | Admitting: Internal Medicine

## 2022-11-19 ENCOUNTER — Encounter (INDEPENDENT_AMBULATORY_CARE_PROVIDER_SITE_OTHER): Payer: Self-pay | Admitting: *Deleted

## 2022-11-19 NOTE — Telephone Encounter (Signed)
Spoke with Dr. Jenetta Downer and Threasa Beards in endo. Pt added to 2/20 at 12pm. Pt will come by office at [facility] to pick up prep and instructions/pre-op appt. Aware to hold Iron as instructions, DM meds.

## 2022-11-21 DIAGNOSIS — E1122 Type 2 diabetes mellitus with diabetic chronic kidney disease: Secondary | ICD-10-CM | POA: Diagnosis not present

## 2022-11-21 DIAGNOSIS — J441 Chronic obstructive pulmonary disease with (acute) exacerbation: Secondary | ICD-10-CM | POA: Diagnosis not present

## 2022-11-21 DIAGNOSIS — I130 Hypertensive heart and chronic kidney disease with heart failure and stage 1 through stage 4 chronic kidney disease, or unspecified chronic kidney disease: Secondary | ICD-10-CM | POA: Diagnosis not present

## 2022-11-21 DIAGNOSIS — J9621 Acute and chronic respiratory failure with hypoxia: Secondary | ICD-10-CM | POA: Diagnosis not present

## 2022-11-21 DIAGNOSIS — I5031 Acute diastolic (congestive) heart failure: Secondary | ICD-10-CM | POA: Diagnosis not present

## 2022-11-21 DIAGNOSIS — N1831 Chronic kidney disease, stage 3a: Secondary | ICD-10-CM | POA: Diagnosis not present

## 2022-11-22 DIAGNOSIS — R54 Age-related physical debility: Secondary | ICD-10-CM | POA: Diagnosis not present

## 2022-11-22 DIAGNOSIS — E039 Hypothyroidism, unspecified: Secondary | ICD-10-CM | POA: Diagnosis not present

## 2022-11-22 DIAGNOSIS — E782 Mixed hyperlipidemia: Secondary | ICD-10-CM | POA: Diagnosis not present

## 2022-11-22 DIAGNOSIS — D500 Iron deficiency anemia secondary to blood loss (chronic): Secondary | ICD-10-CM | POA: Diagnosis not present

## 2022-11-22 DIAGNOSIS — E6609 Other obesity due to excess calories: Secondary | ICD-10-CM | POA: Diagnosis not present

## 2022-11-22 DIAGNOSIS — I952 Hypotension due to drugs: Secondary | ICD-10-CM | POA: Diagnosis not present

## 2022-11-22 DIAGNOSIS — J449 Chronic obstructive pulmonary disease, unspecified: Secondary | ICD-10-CM | POA: Diagnosis not present

## 2022-11-22 DIAGNOSIS — Z6835 Body mass index (BMI) 35.0-35.9, adult: Secondary | ICD-10-CM | POA: Diagnosis not present

## 2022-11-22 DIAGNOSIS — I5021 Acute systolic (congestive) heart failure: Secondary | ICD-10-CM | POA: Diagnosis not present

## 2022-11-22 DIAGNOSIS — I10 Essential (primary) hypertension: Secondary | ICD-10-CM | POA: Diagnosis not present

## 2022-11-22 DIAGNOSIS — J9611 Chronic respiratory failure with hypoxia: Secondary | ICD-10-CM | POA: Diagnosis not present

## 2022-11-22 DIAGNOSIS — R7309 Other abnormal glucose: Secondary | ICD-10-CM | POA: Diagnosis not present

## 2022-11-22 DIAGNOSIS — K922 Gastrointestinal hemorrhage, unspecified: Secondary | ICD-10-CM | POA: Diagnosis not present

## 2022-11-22 DIAGNOSIS — Z9981 Dependence on supplemental oxygen: Secondary | ICD-10-CM | POA: Diagnosis not present

## 2022-11-23 DIAGNOSIS — N1831 Chronic kidney disease, stage 3a: Secondary | ICD-10-CM | POA: Diagnosis not present

## 2022-11-23 DIAGNOSIS — J441 Chronic obstructive pulmonary disease with (acute) exacerbation: Secondary | ICD-10-CM | POA: Diagnosis not present

## 2022-11-23 DIAGNOSIS — I130 Hypertensive heart and chronic kidney disease with heart failure and stage 1 through stage 4 chronic kidney disease, or unspecified chronic kidney disease: Secondary | ICD-10-CM | POA: Diagnosis not present

## 2022-11-23 DIAGNOSIS — E1122 Type 2 diabetes mellitus with diabetic chronic kidney disease: Secondary | ICD-10-CM | POA: Diagnosis not present

## 2022-11-23 DIAGNOSIS — I5031 Acute diastolic (congestive) heart failure: Secondary | ICD-10-CM | POA: Diagnosis not present

## 2022-11-23 DIAGNOSIS — J9621 Acute and chronic respiratory failure with hypoxia: Secondary | ICD-10-CM | POA: Diagnosis not present

## 2022-11-25 DIAGNOSIS — J9621 Acute and chronic respiratory failure with hypoxia: Secondary | ICD-10-CM | POA: Diagnosis not present

## 2022-11-25 DIAGNOSIS — E1122 Type 2 diabetes mellitus with diabetic chronic kidney disease: Secondary | ICD-10-CM | POA: Diagnosis not present

## 2022-11-25 DIAGNOSIS — I5031 Acute diastolic (congestive) heart failure: Secondary | ICD-10-CM | POA: Diagnosis not present

## 2022-11-25 DIAGNOSIS — J441 Chronic obstructive pulmonary disease with (acute) exacerbation: Secondary | ICD-10-CM | POA: Diagnosis not present

## 2022-11-25 DIAGNOSIS — N1831 Chronic kidney disease, stage 3a: Secondary | ICD-10-CM | POA: Diagnosis not present

## 2022-11-25 DIAGNOSIS — I130 Hypertensive heart and chronic kidney disease with heart failure and stage 1 through stage 4 chronic kidney disease, or unspecified chronic kidney disease: Secondary | ICD-10-CM | POA: Diagnosis not present

## 2022-11-27 DIAGNOSIS — N1831 Chronic kidney disease, stage 3a: Secondary | ICD-10-CM | POA: Diagnosis not present

## 2022-11-27 DIAGNOSIS — J441 Chronic obstructive pulmonary disease with (acute) exacerbation: Secondary | ICD-10-CM | POA: Diagnosis not present

## 2022-11-27 DIAGNOSIS — I130 Hypertensive heart and chronic kidney disease with heart failure and stage 1 through stage 4 chronic kidney disease, or unspecified chronic kidney disease: Secondary | ICD-10-CM | POA: Diagnosis not present

## 2022-11-27 DIAGNOSIS — I5031 Acute diastolic (congestive) heart failure: Secondary | ICD-10-CM | POA: Diagnosis not present

## 2022-11-27 DIAGNOSIS — J9621 Acute and chronic respiratory failure with hypoxia: Secondary | ICD-10-CM | POA: Diagnosis not present

## 2022-11-27 DIAGNOSIS — E1122 Type 2 diabetes mellitus with diabetic chronic kidney disease: Secondary | ICD-10-CM | POA: Diagnosis not present

## 2022-11-28 ENCOUNTER — Encounter (HOSPITAL_COMMUNITY): Payer: Self-pay

## 2022-11-28 NOTE — Patient Instructions (Addendum)
SAEVION PFALZGRAF  11/28/2022     @PREFPERIOPPHARMACY$ @   Your procedure is scheduled on 12/03/2022.  Report to [facility] at 10:00 A.M.  Call this number if you have problems the morning of surgery:  [phone]  If you experience any cold or flu symptoms such as cough, fever, chills, shortness of breath, etc. between now and your scheduled surgery, please notify us at the above number.   Remember:   Please follow the diet and prep instrucitons given to you by Dr Villa Herb office.     Take these medicines the morning of surgery with A SIP OF WATER : Norvasc Norco and Pantaprazole   Hold Iron for 5 days prior to the procedure   Hold Glimepiride the night prior to and the morning of the procedure.    No diabetic meds the am of the procedure.    Do not wear jewelry, make-up or nail polish.  Do not wear lotions, powders, or perfumes, or deodorant.  Do not shave 48 hours prior to surgery.  Men may shave face and neck.  Do not bring valuables to the hospital.  [facility] is not responsible for any belongings or valuables.  Contacts, dentures or bridgework may not be worn into surgery.  Leave your suitcase in the car.  After surgery it may be brought to your room.  For patients admitted to the hospital, discharge time will be determined by your treatment team.  Patients discharged the day of surgery will not be allowed to drive home.   Name and phone number of your driver:   Family Special instructions:  N/A  Please read over the following fact sheets that you were given. Care and Recovery After Surgery  Colonoscopy, Adult A colonoscopy is a procedure to look at the entire large intestine. This procedure is done using a long, thin, flexible tube that has a camera on the end. You may have a colonoscopy: As a part of normal colorectal screening. If you have certain symptoms, such as: A low number of red blood cells in your blood (anemia). Diarrhea that does not go  away. Pain in your abdomen. Blood in your stool. A colonoscopy can help screen for and diagnose medical problems, including: An abnormal growth of cells or tissue (tumor). Abnormal growths within the lining of your intestine (polyps). Inflammation. Areas of bleeding. Tell your health care provider about: Any allergies you have. All medicines you are taking, including vitamins, herbs, eye drops, creams, and over-the-counter medicines. Any problems you or family members have had with anesthetic medicines. Any bleeding problems you have. Any surgeries you have had. Any medical conditions you have. Any problems you have had with having bowel movements. Whether you are pregnant or may be pregnant. What are the risks? Generally, this is a safe procedure. However, problems may occur, including: Bleeding. Damage to your intestine. Allergic reactions to medicines given during the procedure. Infection. This is rare. What happens before the procedure? Eating and drinking restrictions Follow instructions from your health care provider about eating or drinking restrictions, which may include: A few days before the procedure: Follow a low-fiber diet. Avoid nuts, seeds, dried fruit, raw fruits, and vegetables. 1-3 days before the procedure: Eat only gelatin dessert or ice pops. Drink only clear liquids, such as water, clear juice, clear broth or bouillon, black coffee or tea, or clear soft drinks or sports drinks. Avoid liquids that contain red or purple dye. The day of the procedure: Do not eat solid  foods. You may continue to drink clear liquids until up to 2 hours before the procedure. Do not eat or drink anything starting 2 hours before the procedure, or within the time period that your health care provider recommends. Bowel prep If you were prescribed a bowel prep to take by mouth (orally) to clean out your colon: Take it as told by your health care provider. Starting the day before your  procedure, you will need to drink a large amount of liquid medicine. The liquid will cause you to have many bowel movements of loose stool until your stool becomes almost clear or light green. If your skin or the opening between the buttocks (anus) gets irritated from diarrhea, you may relieve the irritation using: Wipes with medicine in them, such as adult wet wipes with aloe and vitamin E. A product to soothe skin, such as petroleum jelly. If you vomit while drinking the bowel prep: Take a break for up to 60 minutes. Begin the bowel prep again. Call your health care provider if you keep vomiting or you cannot take the bowel prep without vomiting. To clean out your colon, you may also be given: Laxative medicines. These help you have a bowel movement. Instructions for enema use. An enema is liquid medicine injected into your rectum. Medicines Ask your health care provider about: Changing or stopping your regular medicines or supplements. This is especially important if you are taking iron supplements, diabetes medicines, or blood thinners. Taking medicines such as aspirin and ibuprofen. These medicines can thin your blood. Do not take these medicines unless your health care provider tells you to take them. Taking over-the-counter medicines, vitamins, herbs, and supplements. General instructions Ask your health care provider what steps will be taken to help prevent infection. These may include washing skin with a germ-killing soap. If you will be going home right after the procedure, plan to have a responsible adult: Take you home from the hospital or clinic. You will not be allowed to drive. Care for you for the time you are told. What happens during the procedure?  An IV will be inserted into one of your veins. You will be given a medicine to make you fall asleep (general anesthetic). You will lie on your side with your knees bent. A lubricant will be put on the tube. Then the tube will  be: Inserted into your anus. Gently eased through all parts of your large intestine. Air will be sent into your colon to keep it open. This may cause some pressure or cramping. Images will be taken with the camera and will appear on a screen. A small tissue sample may be removed to be looked at under a microscope (biopsy). The tissue may be sent to a lab for testing if any signs of problems are found. If small polyps are found, they may be removed and checked for cancer cells. When the procedure is finished, the tube will be removed. The procedure may vary among health care providers and hospitals. What happens after the procedure? Your blood pressure, heart rate, breathing rate, and blood oxygen level will be monitored until you leave the hospital or clinic. You may have a small amount of blood in your stool. You may pass gas and have mild cramping or bloating in your abdomen. This is caused by the air that was used to open your colon during the exam. If you were given a sedative during the procedure, it can affect you for several hours. Do not drive  or operate machinery until your health care provider says that it is safe. It is up to you to get the results of your procedure. Ask your health care provider, or the department that is doing the procedure, when your results will be ready. Summary A colonoscopy is a procedure to look at the entire large intestine. Follow instructions from your health care provider about eating and drinking before the procedure. If you were prescribed an oral bowel prep to clean out your colon, take it as told by your health care provider. During the colonoscopy, a flexible tube with a camera on its end is inserted into the anus and then passed into all parts of the large intestine. This information is not intended to replace advice given to you by your health care provider. Make sure you discuss any questions you have with your health care provider. Document  Revised: 09/24/2021 Document Reviewed: 05/23/2021 Elsevier Patient Education  [place] Anesthesia refers to the techniques, procedures, and medicines that help a person stay safe and comfortable during surgery. Monitored anesthesia care, or sedation, is one type of anesthesia. You may have sedation if you do not need to be asleep for your procedure. Procedures that use sedation may include: Surgery to remove cataracts from your eyes. A dental procedure. A biopsy. This is when a tissue sample is removed and looked at under a microscope. You will be watched closely during your procedure. Your level of sedation or type of anesthesia may be changed to fit your needs. Tell a health care provider about: Any allergies you have. All medicines you are taking, including vitamins, herbs, eye drops, creams, and over-the-counter medicines. Any problems you or family members have had with anesthesia. Any bleeding problems you have. Any surgeries you have had. Any medical conditions or illnesses you have. This includes sleep apnea, cough, fever, or the flu. Whether you are pregnant or may be pregnant. Whether you use cigarettes, alcohol, or drugs. Any use of steroids, whether by mouth or as a cream. What are the risks? Your health care provider will talk with you about risks. These may include: Getting too much medicine (oversedation). Nausea. Allergic reactions to medicines. Trouble breathing. If this happens, a breathing tube may be used to help you breathe. It will be removed when you are awake and breathing on your own. Heart trouble. Lung trouble. Confusion that gets better with time (emergence delirium). What happens before the procedure? When to stop eating and drinking Follow instructions from your health care provider about what you may eat and drink. These may include: 8 hours before your procedure Stop eating most foods. Do not eat meat, fried foods,  or fatty foods. Eat only light foods, such as toast or crackers. All liquids are okay except energy drinks and alcohol. 6 hours before your procedure Stop eating. Drink only clear liquids, such as water, clear fruit juice, black coffee, plain tea, and sports drinks. Do not drink energy drinks or alcohol. 2 hours before your procedure Stop drinking all liquids. You may be allowed to take medicines with small sips of water. If you do not follow your health care provider's instructions, your procedure may be delayed or canceled. Medicines Ask your health care provider about: Changing or stopping your regular medicines. These include any diabetes medicines or blood thinners you take. Taking medicines such as aspirin and ibuprofen. These medicines can thin your blood. Do not take them unless your health care provider tells you to.  Taking over-the-counter medicines, vitamins, herbs, and supplements. Testing You may have an exam or testing. You may have a blood or urine sample taken. General instructions Do not use any products that contain nicotine or tobacco for at least 4 weeks before the procedure. These products include cigarettes, chewing tobacco, and vaping devices, such as e-cigarettes. If you need help quitting, ask your health care provider. If you will be going home right after the procedure, plan to have a responsible adult: Take you home from the hospital or clinic. You will not be allowed to drive. Care for you for the time you are told. What happens during the procedure?  Your blood pressure, heart rate, breathing, level of pain, and blood oxygen level will be monitored. An IV will be inserted into one of your veins. You may be given: A sedative. This helps you relax. Anesthesia. This will: Numb certain areas of your body. Make you fall asleep for surgery. You will be given medicines as needed to keep you comfortable. The more medicine you are given, the deeper your level of  sedation will be. Your level of sedation may be changed to fit your needs. There are three levels of sedation: Mild sedation. At this level, you may feel awake and relaxed. You will be able to follow directions. Moderate sedation. At this level, you will be sleepy. You may not remember the procedure. Deep sedation. At this level, you will be asleep. You will not remember the procedure. How you get the medicines will depend on your age and the procedure. They may be given as: A pill. This may be taken by mouth (orally) or inserted into the rectum. An injection. This may be into a vein or muscle. A spray through the nose. After your procedure is over, the medicine will be stopped. The procedure may vary among health care providers and hospitals. What happens after the procedure? Your blood pressure, heart rate, breathing rate, and blood oxygen level will be monitored until you leave the hospital or clinic. You may feel sleepy, clumsy, or nauseous. You may not remember what happened during or after the procedure. Sedation can affect you for several hours. Do not drive or use machinery until your health care provider says that it is safe. This information is not intended to replace advice given to you by your health care provider. Make sure you discuss any questions you have with your health care provider. Document Revised: 02/25/2022 Document Reviewed: 02/25/2022 Elsevier Patient Education  [place].

## 2022-11-29 ENCOUNTER — Encounter (HOSPITAL_COMMUNITY): Payer: Self-pay

## 2022-11-29 ENCOUNTER — Encounter (HOSPITAL_COMMUNITY)
Admission: RE | Admit: 2022-11-29 | Discharge: 2022-11-29 | Disposition: A | Discharge status: Home / Self Care | Payer: Medicare Other | Source: Ambulatory Visit | Attending: Gastroenterology | Admitting: Gastroenterology

## 2022-11-29 DIAGNOSIS — Z01818 Encounter for other preprocedural examination: Secondary | ICD-10-CM | POA: Diagnosis not present

## 2022-11-29 DIAGNOSIS — I493 Ventricular premature depolarization: Secondary | ICD-10-CM | POA: Insufficient documentation

## 2022-11-29 HISTORY — DX: Chronic respiratory failure, unspecified whether with hypoxia or hypercapnia: J96.10

## 2022-11-29 HISTORY — DX: Type 2 diabetes mellitus without complications: E11.9

## 2022-11-29 HISTORY — DX: Dyspnea, unspecified: R06.00

## 2022-12-03 ENCOUNTER — Encounter (HOSPITAL_COMMUNITY): Payer: Self-pay | Admitting: Gastroenterology

## 2022-12-03 ENCOUNTER — Encounter (HOSPITAL_COMMUNITY)
Admission: RE | Disposition: A | Discharge status: Home / Self Care | Payer: Self-pay | Source: Ambulatory Visit | Attending: Gastroenterology

## 2022-12-03 ENCOUNTER — Ambulatory Visit (HOSPITAL_COMMUNITY): Payer: Medicare Other | Admitting: Anesthesiology

## 2022-12-03 ENCOUNTER — Ambulatory Visit (HOSPITAL_BASED_OUTPATIENT_CLINIC_OR_DEPARTMENT_OTHER): Payer: Medicare Other | Admitting: Anesthesiology

## 2022-12-03 ENCOUNTER — Other Ambulatory Visit: Payer: Self-pay

## 2022-12-03 ENCOUNTER — Ambulatory Visit (HOSPITAL_COMMUNITY)
Admission: RE | Admit: 2022-12-03 | Discharge: 2022-12-03 | Disposition: A | Discharge status: Home / Self Care | Payer: Medicare Other | Source: Ambulatory Visit | Attending: Gastroenterology | Admitting: Gastroenterology

## 2022-12-03 DIAGNOSIS — K5730 Diverticulosis of large intestine without perforation or abscess without bleeding: Secondary | ICD-10-CM | POA: Diagnosis not present

## 2022-12-03 DIAGNOSIS — K31819 Angiodysplasia of stomach and duodenum without bleeding: Secondary | ICD-10-CM

## 2022-12-03 DIAGNOSIS — K648 Other hemorrhoids: Secondary | ICD-10-CM

## 2022-12-03 DIAGNOSIS — D122 Benign neoplasm of ascending colon: Secondary | ICD-10-CM | POA: Diagnosis not present

## 2022-12-03 DIAGNOSIS — K31A19 Gastric intestinal metaplasia without dysplasia, unspecified site: Secondary | ICD-10-CM | POA: Insufficient documentation

## 2022-12-03 DIAGNOSIS — D123 Benign neoplasm of transverse colon: Secondary | ICD-10-CM | POA: Diagnosis not present

## 2022-12-03 DIAGNOSIS — D509 Iron deficiency anemia, unspecified: Secondary | ICD-10-CM

## 2022-12-03 DIAGNOSIS — J9610 Chronic respiratory failure, unspecified whether with hypoxia or hypercapnia: Secondary | ICD-10-CM | POA: Diagnosis not present

## 2022-12-03 DIAGNOSIS — Z79899 Other long term (current) drug therapy: Secondary | ICD-10-CM | POA: Insufficient documentation

## 2022-12-03 DIAGNOSIS — K635 Polyp of colon: Secondary | ICD-10-CM | POA: Diagnosis not present

## 2022-12-03 DIAGNOSIS — J449 Chronic obstructive pulmonary disease, unspecified: Secondary | ICD-10-CM | POA: Diagnosis not present

## 2022-12-03 DIAGNOSIS — K3189 Other diseases of stomach and duodenum: Secondary | ICD-10-CM | POA: Diagnosis not present

## 2022-12-03 DIAGNOSIS — D120 Benign neoplasm of cecum: Secondary | ICD-10-CM | POA: Diagnosis not present

## 2022-12-03 DIAGNOSIS — Z8546 Personal history of malignant neoplasm of prostate: Secondary | ICD-10-CM | POA: Diagnosis not present

## 2022-12-03 DIAGNOSIS — Z87891 Personal history of nicotine dependence: Secondary | ICD-10-CM | POA: Diagnosis not present

## 2022-12-03 DIAGNOSIS — K449 Diaphragmatic hernia without obstruction or gangrene: Secondary | ICD-10-CM

## 2022-12-03 DIAGNOSIS — K219 Gastro-esophageal reflux disease without esophagitis: Secondary | ICD-10-CM | POA: Insufficient documentation

## 2022-12-03 DIAGNOSIS — I10 Essential (primary) hypertension: Secondary | ICD-10-CM | POA: Diagnosis not present

## 2022-12-03 DIAGNOSIS — E7800 Pure hypercholesterolemia, unspecified: Secondary | ICD-10-CM | POA: Diagnosis not present

## 2022-12-03 DIAGNOSIS — D128 Benign neoplasm of rectum: Secondary | ICD-10-CM

## 2022-12-03 DIAGNOSIS — Z923 Personal history of irradiation: Secondary | ICD-10-CM | POA: Diagnosis not present

## 2022-12-03 DIAGNOSIS — G4733 Obstructive sleep apnea (adult) (pediatric): Secondary | ICD-10-CM | POA: Diagnosis not present

## 2022-12-03 DIAGNOSIS — K621 Rectal polyp: Secondary | ICD-10-CM | POA: Diagnosis not present

## 2022-12-03 DIAGNOSIS — Z9079 Acquired absence of other genital organ(s): Secondary | ICD-10-CM | POA: Insufficient documentation

## 2022-12-03 DIAGNOSIS — Z7984 Long term (current) use of oral hypoglycemic drugs: Secondary | ICD-10-CM | POA: Diagnosis not present

## 2022-12-03 HISTORY — PX: HEMOSTASIS CLIP PLACEMENT: SHX6857

## 2022-12-03 HISTORY — PX: COLONOSCOPY WITH PROPOFOL: SHX5780

## 2022-12-03 HISTORY — PX: HOT HEMOSTASIS: SHX5433

## 2022-12-03 HISTORY — PX: POLYPECTOMY: SHX5525

## 2022-12-03 HISTORY — PX: SUBMUCOSAL LIFTING INJECTION: SHX6855

## 2022-12-03 HISTORY — PX: ENTEROSCOPY: SHX5533

## 2022-12-03 HISTORY — PX: BIOPSY: SHX5522

## 2022-12-03 LAB — IRON AND TIBC
Iron: 28 ug/dL — ABNORMAL LOW (ref 45–182)
Saturation Ratios: 7 % — ABNORMAL LOW (ref 17.9–39.5)
TIBC: 430 ug/dL (ref 250–450)
UIBC: 402 ug/dL

## 2022-12-03 LAB — CBC
HCT: 35.6 % — ABNORMAL LOW (ref 39.0–52.0)
Hemoglobin: 10.4 g/dL — ABNORMAL LOW (ref 13.0–17.0)
MCH: 24.6 pg — ABNORMAL LOW (ref 26.0–34.0)
MCHC: 29.2 g/dL — ABNORMAL LOW (ref 30.0–36.0)
MCV: 84.2 fL (ref 80.0–100.0)
Platelets: 326 10*3/uL (ref 150–400)
RBC: 4.23 MIL/uL (ref 4.22–5.81)
RDW: 18.6 % — ABNORMAL HIGH (ref 11.5–15.5)
WBC: 9 10*3/uL (ref 4.0–10.5)
nRBC: 0 % (ref 0.0–0.2)

## 2022-12-03 LAB — HM COLONOSCOPY

## 2022-12-03 LAB — GLUCOSE, CAPILLARY: Glucose-Capillary: 140 mg/dL — ABNORMAL HIGH (ref 70–99)

## 2022-12-03 LAB — FERRITIN: Ferritin: 22 ng/mL — ABNORMAL LOW (ref 24–336)

## 2022-12-03 SURGERY — COLONOSCOPY WITH PROPOFOL
Anesthesia: General

## 2022-12-03 MED ORDER — PROPOFOL 500 MG/50ML IV EMUL
INTRAVENOUS | Status: DC | PRN
  Administered 2022-12-03: 175 ug/kg/min via INTRAVENOUS

## 2022-12-03 MED ORDER — PROPOFOL 10 MG/ML IV BOLUS
INTRAVENOUS | Status: DC | PRN
  Administered 2022-12-03: 60 mg via INTRAVENOUS

## 2022-12-03 MED ORDER — LACTATED RINGERS IV SOLN
INTRAVENOUS | Status: DC
  Administered 2022-12-03: 1000 mL via INTRAVENOUS

## 2022-12-03 MED ORDER — GLUCAGON HCL RDNA (DIAGNOSTIC) 1 MG IJ SOLR
INTRAMUSCULAR | Status: DC | PRN
  Administered 2022-12-03: .25 mg via INTRAVENOUS

## 2022-12-03 NOTE — Interval H&P Note (Signed)
History and Physical Interval Note:  12/03/2022 11:18 AM  Mark Cannon  has presented today for surgery, with the diagnosis of IDA.  The various methods of treatment have been discussed with the patient and family. After consideration of risks, benefits and other options for treatment, the patient has consented to  Procedure(s) with comments: COLONOSCOPY WITH PROPOFOL (N/A) - 12:00pm, asa 3 ENTEROSCOPY (N/A) - push enteroscopy as a surgical intervention.  The patient's history has been reviewed, patient examined, no change in status, stable for surgery.  I have reviewed the patient's chart and labs.  Questions were answered to the patient's satisfaction.     [name]

## 2022-12-03 NOTE — Anesthesia Preprocedure Evaluation (Signed)
Anesthesia Evaluation  Patient identified by MRN, date of birth, ID band Patient awake    Reviewed: Allergy & Precautions, NPO status , Patient's Chart, lab work & pertinent test results, reviewed documented beta blocker date and time   Airway Mallampati: III  TM Distance: >3 FB Neck ROM: Full    Dental  (+) Dental Advisory Given, Missing Crowns :   Pulmonary shortness of breath, with exertion and Long-Term Oxygen Therapy, sleep apnea , pneumonia, COPD,  oxygen dependent, former smoker   Pulmonary exam normal breath sounds clear to auscultation       Cardiovascular Exercise Tolerance: Poor hypertension, Pt. on medications and Pt. on home beta blockers + DOE  Normal cardiovascular exam Rhythm:Regular Rate:Normal  29-Nov-2022 10:06:11 [facility]AP-300 ROUTINE RECORD January 29, 1951 (38 yr) Male Caucasian Vent. rate 90 BPM PR interval 180 ms QRS duration 66 ms QT/QTcB 354/433 ms P-R-T axes 37 80 40 Sinus rhythm with sinus arrhythmia with occasional Premature ventricular complexes Otherwise normal ECG When compared with ECG of 15-Nov-2022 09:34,new pvc PREVIOUS ECG IS PRESENT Confirmed by Asencion Noble [phone]) on 12/01/2022 8:55:31 PM   Neuro/Psych CVA    GI/Hepatic ,GERD  Medicated and Controlled,,  Endo/Other  diabetes, Well Controlled, Type 2, Oral Hypoglycemic Agents    Renal/GU Renal disease     Musculoskeletal  (+) Arthritis , Osteoarthritis,    Abdominal   Peds  Hematology  (+) Blood dyscrasia, anemia   Anesthesia Other Findings   Reproductive/Obstetrics                             Anesthesia Physical Anesthesia Plan  ASA: 4  Anesthesia Plan: General   Post-op Pain Management: Minimal or no pain anticipated   Induction: Intravenous  PONV Risk Score and Plan: 1 and Propofol infusion  Airway Management Planned: Nasal Cannula and Natural Airway  Additional  Equipment:   Intra-op Plan:   Post-operative Plan:   Informed Consent: I have reviewed the patients History and Physical, chart, labs and discussed the procedure including the risks, benefits and alternatives for the proposed anesthesia with the patient or authorized representative who has indicated his/her understanding and acceptance.     Dental advisory given  Plan Discussed with: CRNA and Surgeon  Anesthesia Plan Comments:         Anesthesia Quick Evaluation

## 2022-12-03 NOTE — Transfer of Care (Signed)
Immediate Anesthesia Transfer of Care Note  Patient: Mark Cannon  Procedure(s) Performed: COLONOSCOPY WITH PROPOFOL ENTEROSCOPY HOT HEMOSTASIS (ARGON PLASMA COAGULATION/BICAP) BIOPSY POLYPECTOMY SUBMUCOSAL LIFTING INJECTION HEMOSTASIS CLIP PLACEMENT  Patient Location: Short Stay  Anesthesia Type:General  Level of Consciousness: drowsy  Airway & Oxygen Therapy: Patient Spontanous Breathing  Post-op Assessment: Report given to RN and Post -op Vital signs reviewed and stable  Post vital signs: Reviewed and stable  Last Vitals:  Vitals Value Taken Time  BP 124/56 12/03/22 1323  Temp 36.5 C 12/03/22 1323  Pulse 94 12/03/22 1323  Resp 20 12/03/22 1323  SpO2 96 % 12/03/22 1323    Last Pain:  Vitals:   12/03/22 1323  TempSrc: Oral  PainSc: 0-No pain      Patients Stated Pain Goal: 5 ([phone] [id number])  Complications: No notable events documented.

## 2022-12-03 NOTE — Anesthesia Postprocedure Evaluation (Signed)
Anesthesia Post Note  Patient: Mark Cannon  Procedure(s) Performed: COLONOSCOPY WITH PROPOFOL ENTEROSCOPY HOT HEMOSTASIS (ARGON PLASMA COAGULATION/BICAP) BIOPSY POLYPECTOMY [place] PLACEMENT  Patient location during evaluation: Phase II Anesthesia Type: General Level of consciousness: awake and alert and oriented Pain management: pain level controlled Vital Signs Assessment: post-procedure vital signs reviewed and stable Respiratory status: spontaneous breathing, nonlabored ventilation, respiratory function stable and patient connected to nasal cannula oxygen Cardiovascular status: blood pressure returned to baseline and stable Postop Assessment: no apparent nausea or vomiting Anesthetic complications: no  No notable events documented.   Last Vitals:  Vitals:   12/03/22 1058 12/03/22 1323  BP: 130/61 (!) 124/56  Pulse: 79 94  Resp: 20 20  Temp: 37.1 C 36.5 C  SpO2: 99% 96%    Last Pain:  Vitals:   12/03/22 1323  TempSrc: Oral  PainSc: 0-No pain                 Victorine Mcnee C Javoris Star

## 2022-12-03 NOTE — Op Note (Signed)
[facility] Patient Name: Mark Cannon Procedure Date: 12/03/2022 12:33 PM MRN: [id number] Date of Birth: 1951/08/18 Attending MD: Maylon Peppers , , [id number] CSN: [id number] Age: 72 Admit Type: Outpatient Procedure:                Colonoscopy Indications:              Iron deficiency anemia Providers:                Maylon Peppers, Caprice Kluver, Aram Candela Referring MD:              Medicines:                Monitored Anesthesia Care Complications:            No immediate complications. Estimated Blood Loss:     Estimated blood loss: none. Procedure:                Pre-Anesthesia Assessment:                           - Prior to the procedure, a History and Physical                            was performed, and patient medications, allergies                            and sensitivities were reviewed. The patient's                            tolerance of previous anesthesia was reviewed.                           - The risks and benefits of the procedure and the                            sedation options and risks were discussed with the                            patient. All questions were answered and informed                            consent was obtained.                           - ASA Grade Assessment: III - A patient with severe                            systemic disease.                           After obtaining informed consent, the colonoscope                            was passed under direct vision. Throughout the                            procedure, the patient's blood pressure, pulse, and  oxygen saturations were monitored continuously. The                            [id number] scope was introduced through                            the anus and advanced to the the cecum, identified                            by appendiceal orifice and ileocecal valve. The                            colonoscopy was performed without  difficulty. The                            patient tolerated the procedure well. The quality                            of the bowel preparation was fair. Scope In: 12:37:52 PM Scope Out: 1:18:53 PM Scope Withdrawal Time: 0 hours 30 minutes 56 seconds  Total Procedure Duration: 0 hours 41 minutes 1 second  Findings:      The perianal and digital rectal examinations were normal.      Two sessile polyps were found in the cecum. The polyps were 1 to 2 mm in       size. These polyps were removed with a cold biopsy forceps. Resection       and retrieval were complete.      A 16 mm polyp was found in the ascending colon. The polyp was [place]       classification mixed IIa + IIc (superficial elevation with central       depression). Area was successfully injected with 2.4 mL Eleview for a       lift polypectomy. Imaging was performed using white light and narrow       band imaging to visualize the mucosa and demarcate the polyp site after       injection for EMR purposes. The polyp was removed with a piecemeal       technique using a hot snare. Resection and retrieval were complete. To       prevent bleeding after the polypectomy, one hemostatic clip was       successfully placed. Clip manufacturer: [employer]. There was no       bleeding at the end of the procedure.      Seven sessile polyps were found in the rectum, transverse colon and       ascending colon. The polyps were 3 to 12 mm in size. These polyps were       removed with a cold snare. Resection and retrieval were complete.      Scattered medium-mouthed and small-mouthed diverticula were found in the       entire colon.      Non-bleeding internal hemorrhoids were found during retroflexion. The       hemorrhoids were small. Impression:               - Preparation of the colon was fair.                           -  Two 1 to 2 mm polyps in the cecum, removed with a                            cold biopsy forceps. Resected and  retrieved.                           - One 16 mm polyp in the ascending colon, removed                            piecemeal EMR using a hot snare. Resected and                            retrieved. Injected. Clip was placed. Clip                            manufacturer: [employer].                           - Seven 3 to 12 mm polyps in the rectum, in the                            transverse colon and in the ascending colon,                            removed with a cold snare. Resected and retrieved.                           - Diverticulosis in the entire examined colon.                           - Non-bleeding internal hemorrhoids. Moderate Sedation:      Per Anesthesia Care Recommendation:           - Discharge patient to home (ambulatory).                           - Resume previous diet.                           - Await pathology results.                           - Repeat colonoscopy for surveillance based on                            pathology results. May need a repeat in 1 year due                            to piecemela polypectomy                           - Return to GI clinic in 1 month.                           - Check CBC  and iron stores. Procedure Code(s):        --- Professional ---                           [phone], 59, Colonoscopy, flexible; with removal of                            tumor(s), polyp(s), or other lesion(s) by snare                            technique                           45380, 68, Colonoscopy, flexible; with biopsy,                            single or multiple                           45381, Colonoscopy, flexible; with directed                            submucosal injection(s), any substance Diagnosis Code(s):        --- Professional ---                           K64.8, Other hemorrhoids                           D12.0, Benign neoplasm of cecum                           D12.8, Benign neoplasm of rectum                            D12.3, Benign neoplasm of transverse colon (hepatic                            flexure or splenic flexure)                           D12.2, Benign neoplasm of ascending colon                           D50.9, Iron deficiency anemia, unspecified                           K57.30, Diverticulosis of large intestine without                            perforation or abscess without bleeding CPT copyright 2022 American [employer]. All rights reserved. The codes documented in this report are preliminary and upon coder review may  be revised to meet current compliance requirements. Maylon Peppers, MD Maylon Peppers,  12/03/2022 1:30:21 PM This report has been signed electronically. Number of Addenda: 0

## 2022-12-03 NOTE — Op Note (Signed)
[facility] Patient Name: Mark Cannon Procedure Date: 12/03/2022 11:44 AM MRN: [id number] Date of Birth: 04/20/1951 Attending MD: Maylon Peppers , , [id number] CSN: [id number] Age: 72 Admit Type: Outpatient Procedure:                Small bowel enteroscopy Indications:              Iron deficiency anemia, AVMs in capsule endoscopy Providers:                Maylon Peppers, Crystal Page, [facility] Risa Grill, Technician Referring MD:              Medicines:                Monitored Anesthesia Care Complications:            No immediate complications. Estimated Blood Loss:     Estimated blood loss: none. Procedure:                Pre-Anesthesia Assessment:                           - Prior to the procedure, a History and Physical                            was performed, and patient medications, allergies                            and sensitivities were reviewed. The patient's                            tolerance of previous anesthesia was reviewed.                           - The risks and benefits of the procedure and the                            sedation options and risks were discussed with the                            patient. All questions were answered and informed                            consent was obtained.                           - ASA Grade Assessment: III - A patient with severe                            systemic disease.                           After obtaining informed consent, the endoscope was                            passed under direct vision.  Throughout the                            procedure, the patient's blood pressure, pulse, and                            oxygen saturations were monitored continuously. The                            [id number]) scope was introduced through                            the mouth and advanced to the proximal jejunum. The                            small bowel enteroscopy  was accomplished without                            difficulty. The patient tolerated the procedure                            well. Scope In: 12:04:41 PM Scope Out: 12:30:53 PM Total Procedure Duration: 0 hours 26 minutes 12 seconds  Findings:      A 1 cm hiatal hernia was present.      Localized mildly erythematous mucosa was found in the gastric antrum.      Two small mucosal nodules were found in the duodenal bulb. Biopsies were       taken with a cold forceps for histology.      Three angiodysplastic lesions with no bleeding were found in the second       portion of the duodenum and in the third portion of the duodenum.       Coagulation for bleeding prevention using argon plasma at 0.3       liters/minute and 20 watts was successful.      Eight angiodysplastic lesions with no bleeding were found in the       proximal jejunum. Coagulation for bleeding prevention using argon plasma       at 0.3 liters/minute and 20 watts was successful.      There were two more AVMs distal to the area of maximum insertion but       scope could not be advanced despite applying pressure. Impression:               - 1 cm hiatal hernia.                           - Erythematous mucosa in the antrum.                           - Mucosal duodenal nodule(s). Biopsied.                           - Three non-bleeding angiodysplastic lesions in the                            duodenum. Treated with argon plasma coagulation                            (  APC).                           - Eight non-bleeding angiodysplastic lesions in the                            duodenum. Treated with argon plasma coagulation                            (APC). Moderate Sedation:      Per Anesthesia Care Recommendation:           - Discharge patient to home (ambulatory).                           - Resume previous diet.                           - Continue present medications.                           - If recurrent anemia, will  consider octreotide SQ                            vs referral for ADBE at [facility].                           - Await pathology results. Procedure Code(s):        --- Professional ---                           [phone], 69, Small intestinal endoscopy, enteroscopy                            beyond second portion of duodenum, not including                            ileum; with control of bleeding (eg, injection,                            bipolar cautery, unipolar cautery, laser, heater                            probe, stapler, plasma coagulator)                           44361, Small intestinal endoscopy, enteroscopy                            beyond second portion of duodenum, not including                            ileum; with biopsy, single or multiple Diagnosis Code(s):        --- Professional ---                           K44.9, Diaphragmatic hernia without obstruction or  gangrene                           K31.89, Other diseases of stomach and duodenum                           K31.819, Angiodysplasia of stomach and duodenum                            without bleeding                           D50.9, Iron deficiency anemia, unspecified CPT copyright 2022 American Medical Association. All rights reserved. The codes documented in this report are preliminary and upon coder review may  be revised to meet current compliance requirements. Maylon Peppers, MD Maylon Peppers,  12/03/2022 12:36:58 PM This report has been signed electronically. Number of Addenda: 0

## 2022-12-03 NOTE — Discharge Instructions (Addendum)
You are being discharged to home.  Resume your previous diet.  Continue your present medications.  If recurrent anemia, will consider octreotide SQ vs referral for ADBE at [facility]. We are waiting for your pathology results.  Your physician has recommended a repeat colonoscopy for surveillance based on pathology results.  Return to your GI clinic in one month.

## 2022-12-04 ENCOUNTER — Telehealth: Payer: Self-pay | Admitting: *Deleted

## 2022-12-04 ENCOUNTER — Encounter (INDEPENDENT_AMBULATORY_CARE_PROVIDER_SITE_OTHER): Payer: Self-pay | Admitting: *Deleted

## 2022-12-04 DIAGNOSIS — I130 Hypertensive heart and chronic kidney disease with heart failure and stage 1 through stage 4 chronic kidney disease, or unspecified chronic kidney disease: Secondary | ICD-10-CM | POA: Diagnosis not present

## 2022-12-04 DIAGNOSIS — I5031 Acute diastolic (congestive) heart failure: Secondary | ICD-10-CM | POA: Diagnosis not present

## 2022-12-04 DIAGNOSIS — J9621 Acute and chronic respiratory failure with hypoxia: Secondary | ICD-10-CM | POA: Diagnosis not present

## 2022-12-04 DIAGNOSIS — J441 Chronic obstructive pulmonary disease with (acute) exacerbation: Secondary | ICD-10-CM | POA: Diagnosis not present

## 2022-12-04 DIAGNOSIS — E1122 Type 2 diabetes mellitus with diabetic chronic kidney disease: Secondary | ICD-10-CM | POA: Diagnosis not present

## 2022-12-04 DIAGNOSIS — N1831 Chronic kidney disease, stage 3a: Secondary | ICD-10-CM | POA: Diagnosis not present

## 2022-12-04 LAB — SURGICAL PATHOLOGY

## 2022-12-04 NOTE — Telephone Encounter (Signed)
Spoke to the patient about clip, he understood about approximate timeframe for the clip  to fall off

## 2022-12-04 NOTE — Telephone Encounter (Signed)
Patient talked with you on phone today and left vm that you asked him if he had any other questions and he did not at that time but then he saw a pink slip on his hospital paper work that says no MRI. Endoscopy clips placed at [place] [facility] in his colon. No MRI  til cleared by xray and he wants to know when clip will be gone or does it need to be removed.   [phone]

## 2022-12-05 ENCOUNTER — Ambulatory Visit: Payer: BLUE CROSS/BLUE SHIELD

## 2022-12-08 NOTE — Progress Notes (Unsigned)
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 Had previously eval by [name] with dx of COPD but never pfts nor need for resp meds though very sedentary due to back pain     History of Present Illness  08/08/2022  Pulmonary/ 1st office eval/ Gasper Hopes / [place] [facility]  Chief Complaint  Patient presents with   Consult    Copd exacerbation and pneumonia of LL lobe  Uses 3LO2 cont 24/7   Dyspnea:  back to baseline but 02 at 3 (was 4 lpm)  Cough: none  Sleep: flat  SABA use: has duoneb not using  02 3lpm  Presyncope since d/c  Chronic L > R leg swelling no change  Rec Go to ER:  Admit date:     08/08/2022  Discharge date: 08/09/22   Discharge Diagnoses: Principal Problem:   Symptomatic anemia   Acute respiratory failure with hypoxia ([place])   Essential hypertension   GERD (gastroesophageal reflux disease)   Iron deficiency anemia   Brief Hospital Course: Mark Cannon is a 72 y.o. male with medical history significant of arthritis, cancer, GERD, hyperlipidemia, hypertension presents to the ED with a chief complaint of difficulty breathing.  Patient reports that has been gradually in onset over the last 2 weeks.  The dyspnea is exertional.  His oxygen sats dropped down to 75% while he is exerting himself.  Sitting down situating his oxygen and trying to rest makes it better.  He reports the lowest oxygen setting he is seen is 65% that was just prior to his previous hospitalization.  Patient reports no fevers, no cough.  He is wearing 4 L nasal cannula at baseline at home.  He reports he is not using his nebulizer because he ran out of medication for it.  He does not have any chest pains.  He does have some dizziness.  He has had no loss of consciousness.  No change in vision.  He reports he does  have a ringing in his ear sometimes.  Patient reports that he did quit smoking last month.  He has orthopnea at home that he reports is resolved but laying on his left or right side.  He reports no peripheral edema.  Patient reports a family history with his mother dying of "low hemoglobin."  According to patient, mother had been anemic for the last couple years of her life and had to have recurrent transfusions and there was never any etiology identified.   Patient does not smoke, does not drink, does not use illicit drugs.  He is vaccinated for COVID.  Patient is full code.   Assessment and Plan: * Symptomatic anemia -Hemoglobin down to 7.6 - Latest hemoglobin about 1 month ago around 11 - Excellent response to 1 unit PRBC transfusion during hospitalization -At discharge hemoglobin 8.9  -No signs of overt bleeding appreciated, patient's symptoms completely improved/resolved. -Case discussed with gastroenterology who recommended outpatient work-up for anemia and endoscopic evaluation if needed.   -Continue PPI and avoid any NSAIDs outside the use of baby enteric-coated aspirin.   -IV iron x1 also provided while inpatient as anemia panel demonstrated component of iron deficiency.   Obesity, Class I, BMI 30-34.9 -Body mass index is 34.86 kg/m. -Low-calorie diet, portion  control and increase physical activity discussed with patient.   Iron deficiency anemia - Anemia panel demonstrating component of iron deficiency anemia -Endoscopic evaluation GI work-up for anemia to be pursued as an outpatient -Continue PPI -Fecal occult blood test negative -Patient hemodynamically stable.   GERD (gastroesophageal reflux disease) -Continue Protonix -Lifestyle changes discussed with patient.   Essential hypertension -Resume home antihypertensive regimen. -Heart healthy diet discussed with patient.   Acute respiratory failure with hypoxia (HCC) -On oxygen at home since last month -Stable saturation  appreciated -Continue 4 L nasal cannula supplementation -Continue as needed bronchodilators -Very likely component of hypoxia unsure when the sensation driven by anemia. -Patient feeling ready to go home currently. -Outpatient follow-up with pulmonologist recommended. -No acute abnormalities appreciated on chest images.     Admit date:     11/13/2022  Discharge date: 11/15/22    Recommendations at discharge:    Gastroenterologist in next 2-3 days-retrieving capsule endoscopy and results Labs CBC, BMP in 1 week results to PCP Monitor daily weight, continue supplemental oxygen (continuous 4 L by nasal cannula) Home health, PT OT, Follow-up with pulmonologist -results of pleural fluid   Discharge Diagnoses: Principal Problem:   Acute on chronic respiratory failure with hypoxia ([place])   Acute heart failure with preserved ejection fraction (HFpEF) (HCC)   Prostate cancer ([place])   Lumbar stenosis with neurogenic claudication   Rectal bleeding   Stroke (cerebrum) ([place])   COPD with acute exacerbation ([place])   Essential hypertension   GERD (gastroesophageal reflux disease)   Iron deficiency anemia   Class 2 obesity   Acute on chronic respiratory failure [facility])     Hospital Course: Mark Cannon  is a 72 year old male with h/o HTN, HLD,   prostate cancer (S/P prostatectomy), OSA, CKD stage III, chronic respiratory failure on 4 L, tobacco abuse in remission presenting with progressive shortness of breath over past 24 hours. Baseline O2 demand 4 L, up to 6 at home and now up to 10 L of oxygen.   Reporting similar presentation with hypoxia and anemia in the past 2 hospitalization October 2023 and October 15, 2022... He was subsequently discharged on 4 L of oxygen In October he was transfused with 1 unit of PRBC, back in January he was transfused with 2 units of PRBC.    GI workup was deferred to the outpatient setting.  Colonoscopy was planned given his history of iron deficiency anemia.   Subsequently was canceled. He has not had any follow-up since.    He presents again with generalized weakness and dyspnea.  He denies any hematochezia, melena, hematemesis, hematuria.  He has not had any fevers, chills, chest pain, nausea, vomiting, diarrhea, abdominal pain.  He denies any NSAIDs.     ED evaluation: Blood pressure (!) 147/60, pulse 95, Temp. 98.1 F (36.7 C), RR 15, SpO2 100 % on 10 L of oxygen HFNC    WBC 7.9, hemoglobin 7.6, hematocrit 28.5, calcium 8.6, troponin 19, glucose 183, BNP 183.0   Hemoccult positive X 2     Respiratory panel : COVID, RSV, Flu were all negative CTA: Negative for pulmonary embolism, pleural effusion, fluid overload.   He was started on IV Lasix       Assessment and Plan:   Acute on chronic respiratory failure with hypoxia -Much improved -due to fluid overload/pleural effusion -Status post thoracentesis yielding 900 mL, IV Lasix -Check COVID, RSV, Flu--all negative -CTA: Bilateral pleural effusion, congestion,, negative for pulmonary embolism -at [facility]  O2  4L a -Now on 10 L HFNC>> down back to baseline of 4 L of oxygen, satting 93%   -Continue as needed DuoNeb bronchodilator treatments     Bilateral pleural effusion  -Status post thoracentesis from the right, yielding 900 mL fluid, pending fluid cytology and analysis _Pleural fluid cloudy, monocytes of 35, glucose 145, LD 67, lymphocyte of 51, neutrophil of 14, (Thus far not consistent with infection)  -Follow-up with pulmonology - Dr. Melvyn Novas for further evaluation recommendations   symptomatic anemia -Patient has chronic blood loss -Hemoglobin 8.4 at the time of his discharge on 10/15/2022 - Hemoccult positive X 2     Latest Ref Rng & Units 11/14/2022    7:06 AM 11/13/2022   11:36 AM 10/25/2022    2:26 PM  CBC  WBC 4.0 - 10.5 K/uL 7.7  7.9  7.4   Hemoglobin 13.0 - 17.0 g/dL 9.5  7.6  8.2   Hematocrit 39.0 - 52.0 % 33.2  28.5  28.0   Platelets 150 - 400 K/uL 237  249   413         -ferritin 8, iron sat 23% -GI consult appreciated   -Status post transfusion of 2U PRBC  (On Last admission was also transfused with 2U PRBC -pantoprazole I -10/15/22 EGD--normal -10/15/22 colonoscopy--8 polyps removed. +diverticulosis -GI consulted following,  -Capsule endoscopy 11/14/2022 -yet to be retrieved -to be retrieved, Per GI outpatient follow-up capsule equipment can be returned to endoscopy       Acute HFpEF -started on IV lasix >>> will be discontinued today 2-24 -05/15/22 Echo--EF 60-65%, G1DD, no WMA, normal RVF -10/14/22 ReDS = 36          -Resuming home with lasix 40 mg po daily   CKD stage IIIa -Baseline creatinine 1.1-1.4 -monitor with diuresis -serum creatinine 1.103 >> 0.95 >> 1.05,   Pulmonary nodular infiltrates -12/31 CT chest--RUL mass like consolidations -CTA: No mention of nodules,Bilateral pleural effusion, congestion,, negative for pulmonary embolism    Uncontrolled diabetes mellitus type 2 with hyperglycemia -Resume  Amaryl-- -05/14/2022 A1c 7.7 -12/30 Repeat A1c--7.3 - SSI    Iron deficiency Anemia -iron sat 23 -ferritin 8 -Cont.  ferrous sulfate   Essential hypertension -restarted amlodipine 5 mg daily   COPD -stable -he is not on any maintenance medications at home -Quit smoking in September 2023 after 50 pack years     12/09/2022  post hosp f/u ov/Emiko Osorto re: doe/newly on 02 since pna fall 2023   maint on no resp meds/ never  had pfts  Chief Complaint  Patient presents with   Follow-up    CT scan  Feeling less SOB    Dyspnea:  limited by lower back/  Cough: just  clearing throat / not noticeable while sleeping  Sleeping: bed is level/ one pillow  SABA use: has duoneb not using  02: wearing 4lpm 24/7  Covid status:   JJ    No obvious day to day or daytime variability or assoc excess/ purulent sputum or mucus plugs or hemoptysis or cp or chest tightness, subjective wheeze or overt sinus or hb symptoms.   Sleeping   without nocturnal  or early am exacerbation  of respiratory  c/o's or need for noct saba. Also denies any obvious fluctuation of symptoms with weather or environmental changes or other aggravating or alleviating factors except as outlined above   No unusual exposure hx or h/o childhood pna/ asthma or knowledge of premature birth.  Current Allergies, Complete Past Medical  History, Past Surgical History, Family History, and Social History were reviewed in [employer] record.  ROS  The following are not active complaints unless bolded Hoarseness, sore throat, dysphagia, dental problems, itching, sneezing,  nasal congestion or discharge of excess mucus or purulent secretions, ear ache,   fever, chills, sweats, unintended wt loss or wt gain, classically pleuritic or exertional cp,  orthopnea pnd or arm/hand swelling  or leg swelling, presyncope(recurrent pattern, usually if gets up too fast), palpitations, abdominal pain, anorexia, nausea, vomiting, diarrhea  or change in bowel habits or change in bladder habits, change in stools or change in urine, dysuria, hematuria,  rash, arthralgias, visual complaints, headache, numbness, weakness or ataxia or problems with walking or coordination,  change in mood or  memory.        Current Meds  Medication Sig   amLODipine (NORVASC) 5 MG tablet Take 1 tablet (5 mg total) by mouth daily.   aspirin EC 81 MG tablet Take 1 tablet (81 mg total) by mouth daily. Swallow whole.   bisoprolol (ZEBETA) 5 MG tablet Take 1 tablet (5 mg total) by mouth daily.   docusate sodium (COLACE) 100 MG capsule Take 100 mg by mouth daily.   ferrous sulfate 325 (65 FE) MG tablet Take 1 tablet (325 mg total) by mouth 3 (three) times daily with meals.   furosemide (LASIX) 40 MG tablet Take 1 tablet (40 mg total) by mouth daily.   glimepiride (AMARYL) 1 MG tablet Take 1 mg by mouth 2 (two) times daily.   HYDROcodone-acetaminophen (NORCO) 10-325 MG tablet Take 1 tablet  by mouth 4 (four) times daily as needed for moderate pain.   OXYGEN Inhale 4 L into the lungs continuous.   pantoprazole (PROTONIX) 40 MG tablet Take 1 tablet (40 mg total) by mouth daily.   PRESCRIPTION MEDICATION Hormone therapy injection every 6 months through [facility] due to prostate cancer.   spironolactone (ALDACTONE) 25 MG tablet Take 1 tablet (25 mg total) by mouth 2 (two) times daily.            Past Medical History:  Diagnosis Date   Arthritis    Cancer [facility])    Prostate, has had prostate removed, has had radiation and still has cancer,   Colon polyps    Cough    GERD (gastroesophageal reflux disease)    occasional    H/O tooth extraction week of 03/15/2015    History of kidney stones    Hypercholesteremia    Hypertension    Lumbar stenosis with neurogenic claudication    Pneumonia    Pre-diabetes    Renal disorder    Sleep apnea    No cpap       Objective:     Wt Readings from Last 3 Encounters:  12/09/22 228 lb (103.4 kg)  12/03/22 225 lb (102.1 kg)  11/29/22 218 lb 11.1 oz (99.2 kg)    Vital signs reviewed  12/09/2022  - Note at rest 02 sats  94% on 2lpm cont    General appearance:    amb obese wm nad / occ throat clearing      HEENT : Oropharynx  clear        NECK :  without  apparent JVD/ palpable Nodes/TM    LUNGS: no acc muscle use,  Nl contour chest with distant bs bilaterally  bilaterally without cough on insp or exp maneuvers   CV:  RRR  no s3 or murmur or increase in P2,  and no edema   ABD: obese soft and nontender with nl inspiratory excursion in the supine position. No bruits or organomegaly appreciated   MS:  Nl gait/ ext warm without deformities Or obvious joint restrictions  calf tenderness, cyanosis or clubbing    SKIN: warm and dry without lesions    NEURO:  alert, approp, nl sensorium with  no motor or cerebellar deficits apparent.      CXR PA and Lateral:   12/09/2022 :    I personally reviewed images and impression  is as follows:     Marked improvement aeration L lung with minimal fluid along the major fissure         Assessment

## 2022-12-09 ENCOUNTER — Ambulatory Visit (INDEPENDENT_AMBULATORY_CARE_PROVIDER_SITE_OTHER): Payer: Medicare Other | Admitting: Internal Medicine

## 2022-12-09 ENCOUNTER — Encounter: Payer: Self-pay | Admitting: Internal Medicine

## 2022-12-09 ENCOUNTER — Ambulatory Visit (HOSPITAL_COMMUNITY)
Admission: RE | Admit: 2022-12-09 | Discharge: 2022-12-09 | Disposition: A | Discharge status: Home / Self Care | Payer: Medicare Other | Source: Ambulatory Visit | Attending: Internal Medicine | Admitting: Internal Medicine

## 2022-12-09 VITALS — BP 134/82 | HR 78 | Ht 67.0 in | Wt 228.0 lb

## 2022-12-09 DIAGNOSIS — R0609 Other forms of dyspnea: Secondary | ICD-10-CM | POA: Diagnosis not present

## 2022-12-09 DIAGNOSIS — Z041 Encounter for examination and observation following transport accident: Secondary | ICD-10-CM | POA: Diagnosis not present

## 2022-12-09 NOTE — Assessment & Plan Note (Addendum)
Onset set 2023  -  Echo [id number] grad 1 diastolic dysfunction  - B R >> L pleural effusions:  R Tap 11/13/22  900 cc transudate in setting of severe anemia attributed to polyps > removed at admit  - 12/09/2022   Walked on ra   approx 75  ft  @ slow  pace, stopped due to feeling fainty with lowest 02 sats 96%   Working dx is anemia plus diastolic dysfunction = volume overload with bilateral effusions which have not recurred so no additional w/u needed for now   In meantime advised: Make sure you check your oxygen saturation  AT  your highest level of activity (not after you stop)   to be sure it stays over 90% and adjust  02 flow upward to maintain this level if needed but remember to turn it back to previous settings when you stop (to conserve your supply).     F/u  6 weeks with pfts on return with 02 prn in meantime and ONO on RA    Each maintenance medication was reviewed in detail including emphasizing most importantly the difference between maintenance and prns and under what circumstances the prns are to be triggered using an action plan format where appropriate.  Total time for H and P, chart review, counseling, reviewing 02 device(s) , directly observing portions of ambulatory 02 saturation study/ and generating customized AVS unique to this office visit / same day charting > 40 min post hosp f/u ov

## 2022-12-09 NOTE — Patient Instructions (Addendum)
My office will be contacting you by phone for referral to PFT  and overnight oximetry  - if you don't hear back from my office within one week please call us back or notify us thru MyChart and we'll address it right away.    Make sure you check your oxygen saturation  AT  your highest level of activity (not after you stop)   to be sure it stays over 90% and adjust  02 flow upward to maintain this level if needed but remember to turn it back to previous settings when you stop (to conserve your supply).    Keep some candy handy :   jolley ranchers/ life savers (not the white ones) to keep you from clearing your throat excessivley    Please remember to go to the  x-ray department  @  [facility] for your tests - we will call you with the results when they are available     Please schedule a follow up office visit in 6 weeks, call sooner if needed

## 2022-12-11 DIAGNOSIS — I130 Hypertensive heart and chronic kidney disease with heart failure and stage 1 through stage 4 chronic kidney disease, or unspecified chronic kidney disease: Secondary | ICD-10-CM | POA: Diagnosis not present

## 2022-12-11 DIAGNOSIS — N1831 Chronic kidney disease, stage 3a: Secondary | ICD-10-CM | POA: Diagnosis not present

## 2022-12-11 DIAGNOSIS — J9621 Acute and chronic respiratory failure with hypoxia: Secondary | ICD-10-CM | POA: Diagnosis not present

## 2022-12-11 DIAGNOSIS — I5031 Acute diastolic (congestive) heart failure: Secondary | ICD-10-CM | POA: Diagnosis not present

## 2022-12-11 DIAGNOSIS — J441 Chronic obstructive pulmonary disease with (acute) exacerbation: Secondary | ICD-10-CM | POA: Diagnosis not present

## 2022-12-11 DIAGNOSIS — E1122 Type 2 diabetes mellitus with diabetic chronic kidney disease: Secondary | ICD-10-CM | POA: Diagnosis not present

## 2022-12-12 ENCOUNTER — Other Ambulatory Visit (INDEPENDENT_AMBULATORY_CARE_PROVIDER_SITE_OTHER): Payer: Self-pay

## 2022-12-12 ENCOUNTER — Encounter (INDEPENDENT_AMBULATORY_CARE_PROVIDER_SITE_OTHER): Payer: Self-pay

## 2022-12-12 ENCOUNTER — Ambulatory Visit: Payer: MEDICARE

## 2022-12-12 DIAGNOSIS — D509 Iron deficiency anemia, unspecified: Secondary | ICD-10-CM

## 2022-12-13 DIAGNOSIS — D500 Iron deficiency anemia secondary to blood loss (chronic): Secondary | ICD-10-CM | POA: Diagnosis not present

## 2022-12-13 DIAGNOSIS — E6609 Other obesity due to excess calories: Secondary | ICD-10-CM | POA: Diagnosis not present

## 2022-12-13 DIAGNOSIS — R7309 Other abnormal glucose: Secondary | ICD-10-CM | POA: Diagnosis not present

## 2022-12-13 DIAGNOSIS — Z9981 Dependence on supplemental oxygen: Secondary | ICD-10-CM | POA: Diagnosis not present

## 2022-12-13 DIAGNOSIS — M170 Bilateral primary osteoarthritis of knee: Secondary | ICD-10-CM | POA: Diagnosis not present

## 2022-12-13 DIAGNOSIS — M5136 Other intervertebral disc degeneration, lumbar region: Secondary | ICD-10-CM | POA: Diagnosis not present

## 2022-12-13 DIAGNOSIS — I10 Essential (primary) hypertension: Secondary | ICD-10-CM | POA: Diagnosis not present

## 2022-12-13 DIAGNOSIS — J9611 Chronic respiratory failure with hypoxia: Secondary | ICD-10-CM | POA: Diagnosis not present

## 2022-12-13 DIAGNOSIS — Z6835 Body mass index (BMI) 35.0-35.9, adult: Secondary | ICD-10-CM | POA: Diagnosis not present

## 2022-12-13 DIAGNOSIS — J449 Chronic obstructive pulmonary disease, unspecified: Secondary | ICD-10-CM | POA: Diagnosis not present

## 2022-12-16 DIAGNOSIS — I130 Hypertensive heart and chronic kidney disease with heart failure and stage 1 through stage 4 chronic kidney disease, or unspecified chronic kidney disease: Secondary | ICD-10-CM | POA: Diagnosis not present

## 2022-12-16 DIAGNOSIS — E1122 Type 2 diabetes mellitus with diabetic chronic kidney disease: Secondary | ICD-10-CM | POA: Diagnosis not present

## 2022-12-16 DIAGNOSIS — N1831 Chronic kidney disease, stage 3a: Secondary | ICD-10-CM | POA: Diagnosis not present

## 2022-12-16 DIAGNOSIS — J9621 Acute and chronic respiratory failure with hypoxia: Secondary | ICD-10-CM | POA: Diagnosis not present

## 2022-12-16 DIAGNOSIS — I5031 Acute diastolic (congestive) heart failure: Secondary | ICD-10-CM | POA: Diagnosis not present

## 2022-12-16 DIAGNOSIS — J441 Chronic obstructive pulmonary disease with (acute) exacerbation: Secondary | ICD-10-CM | POA: Diagnosis not present

## 2022-12-17 DIAGNOSIS — E785 Hyperlipidemia, unspecified: Secondary | ICD-10-CM | POA: Diagnosis not present

## 2022-12-17 DIAGNOSIS — Z9181 History of falling: Secondary | ICD-10-CM | POA: Diagnosis not present

## 2022-12-17 DIAGNOSIS — Z7982 Long term (current) use of aspirin: Secondary | ICD-10-CM | POA: Diagnosis not present

## 2022-12-17 DIAGNOSIS — N1831 Chronic kidney disease, stage 3a: Secondary | ICD-10-CM | POA: Diagnosis not present

## 2022-12-17 DIAGNOSIS — D509 Iron deficiency anemia, unspecified: Secondary | ICD-10-CM | POA: Diagnosis not present

## 2022-12-17 DIAGNOSIS — Z7984 Long term (current) use of oral hypoglycemic drugs: Secondary | ICD-10-CM | POA: Diagnosis not present

## 2022-12-17 DIAGNOSIS — Z6834 Body mass index (BMI) 34.0-34.9, adult: Secondary | ICD-10-CM | POA: Diagnosis not present

## 2022-12-17 DIAGNOSIS — M48062 Spinal stenosis, lumbar region with neurogenic claudication: Secondary | ICD-10-CM | POA: Diagnosis not present

## 2022-12-17 DIAGNOSIS — Z87891 Personal history of nicotine dependence: Secondary | ICD-10-CM | POA: Diagnosis not present

## 2022-12-17 DIAGNOSIS — Z9981 Dependence on supplemental oxygen: Secondary | ICD-10-CM | POA: Diagnosis not present

## 2022-12-17 DIAGNOSIS — E669 Obesity, unspecified: Secondary | ICD-10-CM | POA: Diagnosis not present

## 2022-12-17 DIAGNOSIS — E1122 Type 2 diabetes mellitus with diabetic chronic kidney disease: Secondary | ICD-10-CM | POA: Diagnosis not present

## 2022-12-17 DIAGNOSIS — J9621 Acute and chronic respiratory failure with hypoxia: Secondary | ICD-10-CM | POA: Diagnosis not present

## 2022-12-17 DIAGNOSIS — C61 Malignant neoplasm of prostate: Secondary | ICD-10-CM | POA: Diagnosis not present

## 2022-12-17 DIAGNOSIS — K625 Hemorrhage of anus and rectum: Secondary | ICD-10-CM | POA: Diagnosis not present

## 2022-12-17 DIAGNOSIS — K219 Gastro-esophageal reflux disease without esophagitis: Secondary | ICD-10-CM | POA: Diagnosis not present

## 2022-12-17 DIAGNOSIS — G4733 Obstructive sleep apnea (adult) (pediatric): Secondary | ICD-10-CM | POA: Diagnosis not present

## 2022-12-17 DIAGNOSIS — J441 Chronic obstructive pulmonary disease with (acute) exacerbation: Secondary | ICD-10-CM | POA: Diagnosis not present

## 2022-12-17 DIAGNOSIS — E1165 Type 2 diabetes mellitus with hyperglycemia: Secondary | ICD-10-CM | POA: Diagnosis not present

## 2022-12-17 DIAGNOSIS — Z8673 Personal history of transient ischemic attack (TIA), and cerebral infarction without residual deficits: Secondary | ICD-10-CM | POA: Diagnosis not present

## 2022-12-17 DIAGNOSIS — I5031 Acute diastolic (congestive) heart failure: Secondary | ICD-10-CM | POA: Diagnosis not present

## 2022-12-17 DIAGNOSIS — I130 Hypertensive heart and chronic kidney disease with heart failure and stage 1 through stage 4 chronic kidney disease, or unspecified chronic kidney disease: Secondary | ICD-10-CM | POA: Diagnosis not present

## 2022-12-20 MED ORDER — EPINEPHRINE 0.3 MG/0.3ML IJ SOAJ
.3 mg | INTRAMUSCULAR | 10 refills | PRN
Start: 2022-12-20 — End: ?

## 2022-12-23 DIAGNOSIS — J441 Chronic obstructive pulmonary disease with (acute) exacerbation: Secondary | ICD-10-CM | POA: Diagnosis not present

## 2022-12-23 DIAGNOSIS — I130 Hypertensive heart and chronic kidney disease with heart failure and stage 1 through stage 4 chronic kidney disease, or unspecified chronic kidney disease: Secondary | ICD-10-CM | POA: Diagnosis not present

## 2022-12-23 DIAGNOSIS — J9621 Acute and chronic respiratory failure with hypoxia: Secondary | ICD-10-CM | POA: Diagnosis not present

## 2022-12-23 DIAGNOSIS — E1122 Type 2 diabetes mellitus with diabetic chronic kidney disease: Secondary | ICD-10-CM | POA: Diagnosis not present

## 2022-12-23 DIAGNOSIS — I5031 Acute diastolic (congestive) heart failure: Secondary | ICD-10-CM | POA: Diagnosis not present

## 2022-12-23 DIAGNOSIS — N1831 Chronic kidney disease, stage 3a: Secondary | ICD-10-CM | POA: Diagnosis not present

## 2022-12-23 NOTE — Addendum Note (Signed)
Addended by: Fritzi Mandes D on: 12/23/2022 04:50 PM   Modules accepted: Orders

## 2022-12-25 MED ORDER — EPINEPHRINE 0.3 MG/0.3ML IJ SOAJ
.3 mg | INTRAMUSCULAR | 10 refills | 2.00000 days | Status: AC | PRN
Start: 2022-12-25 — End: ?

## 2022-12-26 ENCOUNTER — Ambulatory Visit: Payer: MEDICARE

## 2022-12-26 ENCOUNTER — Inpatient Hospital Stay: Payer: MEDICARE

## 2022-12-26 DIAGNOSIS — J4489 Chronic asthmatic bronchitis (HCC/RAF): Secondary | ICD-10-CM

## 2022-12-26 DIAGNOSIS — G4733 Obstructive sleep apnea (adult) (pediatric): Secondary | ICD-10-CM

## 2022-12-27 NOTE — Progress Notes
PULMONARY/SLEEP MEDICINE NOTE    PCP: Babs Sciara, MD      CC: OSA and asthma fu     DIAGNOSTIC SLEEP TEST:  In 2006    PULMONARY FUNCTION TEST(s):  N/A    CARDIODIAGNOSTIC EXAM(s):  N/A      INTERVAL HISTORY:  Maurice Ramirez is a 72 y.o. male with  moderate persistent asthma, intermittent episodes of allergic bronchopulmonary aspergillosis , h/o LLL nodule (bx in 2012 benign), OSA on PAP, and h/o afib s/p cardioversion for fu OSA       12/26/22  Was supposed to have pfts today but had a recent oral implant and was told that needs to wait 3 months.  Brought in refurbished dreamstation that he received but did not bring cord so I cannot download data. Filter dirty and we reviewed that he needs to change the blue filter.   Last device distributed 5 years ago.  Got labs done today.  Overall doing well.    Interim history:  Has not had a drink for 6 years. Using PAP nightly and 1/4 of time wakes up and mask is off his face.   Had no episode of asthma exacerbation since last visit. Has not gone to hospital for >3 years.   Exercising daily, walks 45 min a day, [employer] daily    LCV: 7/23: Doing well. 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 Does the [employer], bands, dumbbells - 1.5-2 hours work out then walks and [employer]. Gets enough sleep. Stopped alcohol.    He still reports using his device every night, still wakes up with the mask off and does not recall why, but this is not every night, and on the whole, he denies fatigue, daytime sleepiness, snoring and morning headaches. Uses MAD when he travels.  No asthma exacerbations during this time. Denies shortness of breath, wheezing, chest tightness and dizziness. Compliant on his inhalers. Able to run/jog every few days. Does Bruce yearly and does better every year.    11/01/21: Patient returns after 2 years away from clinic. He has been doing quite well. He still reports using his device every night, still wakes up with the mask off and does not recall why, but this is not every night, and on the whole, he denies fatigue, daytime sleepiness, snoring and morning headaches. Uses MAD when he travels. He is exercising daily (brisk walk/run outside). He increases his exercise tolerance every year. No asthma exacerbations during this time. Denies shortness of breath, wheezing, chest tightness and dizziness. Compliant on his inhalers.      Asthma meds and hx:  Singulair 10mg  daily, advair 500/50 BID and albuterol (1x/week)  Last hospital visit for asthma was many years ago, intubated about >31yrs ago for a severe attack  Last use of prednisone dose for asthma (prior to sinus infections) was several years ago    Current bedtime 10 PM, falls asleep within few min  with PAP, wakes up at 5 am  Patient is tolerating PAP therapy: Yes  On PAP therapy, patient reports improvement in: sleep quality, daytime energy, alertness, and No improvement  Abdominal bloating, nausea or eructation associated with PAP therapy: No  Snoring with PAP: No  Sleepiness with driving or motor vehicle collisions due to drowsy driving: No  The patient is using a standard nasal interface.  Patient is routinely getting replacement supplies: Yes     The patient is willing to continue with PAP therapy  PMH: asthma    Social History     Tobacco Use    Smoking status: Never    Smokeless tobacco: Never       No family history on file.    Allergies   Allergen Reactions    Chocolate     Pollen Extract     Wheat        No outpatient medications have been marked as taking for the 12/26/22 encounter (Office Visit) with Lazaro Arms., MD.       ROS: pertinent review of systems as detailed in the HPI. The rest of the complete review of systems is negative except as detailed in the written health and history questionnaire.      PHYSICAL EXAM:  BP 107/56  ~ Pulse 55  ~ Temp 36.5 ?C (97.7 ?F) (Tympanic)  ~ Ht 5' 9'' (1.753 m)  ~ Wt 168 lb (76.2 kg)  ~ SpO2 96%  ~ BMI 24.81 kg/m?   General: Well-developed male, in no acute distress.   HEENT: Normocephalic and atraumatic. Pupils are equal, round,  Mallampati class III airway. No exudates or lesions.   Neck: Supple.   Extremities: none lower extremity edema. No clubbing or cyanosis.  Neuro: Alert, oriented and appropriately interactive. No focal deficits. Speech fluent.   Psychological: normal affect    LABS: l     .labres    COMPLIANCE DATA:  N/A  NEEDS to link the device to the system    Labs  IgE  2119 (1666 07/2018 <-1422)  CBC with eos normal    Imaging:  CT chest 2012  Unchanged biapical fibronodular pleural parenchymal scarring and calcified subcentimeter nodule within the right middle lobe (2-51), consistent with prior granulomatous disease.   Persistent diffuse peribronchial and peribronchiolar thickening with associated airway irregularity and occasional ectasia, consistent with chronic asthma/bronchitis, with scattered bronchocentric bands and architectural distortion within both upper, right middle and left   greater than right lower lobes, consistent with postinflammatory pleural parenchymal scarring.  Near-complete resolution of mass-like consolidation within the left lower lobe with residual architectural distortion (2-56). Several fluctuating subcentimeter nodules within the left lower lobe and resolved within the right lower lobe since 05/28/2011, likely airway-related inflammatory and mucus impaction.     Pathology:  LLL nodule 05/2011  LUNG NODULE, LEFT LOWER LOBE (BIOPSY):  - Acute and organizing pneumonia (see Comment)  - Prominent chronic inflammation in interstitium  - Gram stain is negative for bacterial organisms  - GMS stain is negative for fungal organisms  - AFB stain is negative for acid-fast bacilli  - No neoplasms identified     PFT 12/2019  FVC  (preBD) 3.60/90% (postBD) 4.26/108%   FEV1 (preBD 1.22/40% (postBD) 1.60/52%  Has positive BD response         PFT 07/2018  FVC 3.95 (94.8%),  FEV1 1.51 (48.7%)  DLCO 28.4 (100%)     PFT 09/2014:  FVC 4.04 (93.7%), post 4.436 (103.6%)  FEV1  1.29 (39.8%), post 1.67 (51.6%)  DLCO 100.7%     PFT 08/2010  FVC 4.80 (109%), post 4.99 (114%)  FEV 1.85 (55%), post 2.01 (60%)        ASSESSMENT:  72 y.o. man with  moderate persistent asthma (FEV1 40.1%), intermittent episodes of allergic bronchopulmonary aspergillosis , h/o LLL nodule (bx in 2012 benign) and OSA on PAP presents for follow up. Has been doing well with no issues. No compliance data recently, patient is due for a new device, so can assess  data from new device.       Obstructive Sleep Apnea:  Has refurbished DS, which needs to be registered with system.   Continue with PAP therapy, the patient has acclimated well and is experiencing symptomatic improvement.  The patient is willing to continue with PAP therapy.  Will link his device, he will bring his PAP next time to get it linked in system   Rx placed for new resmed airsense 11     Asthma  Well controlled  No change in therapy  IgE elevated but eos normal - will await pfts. Overall patient feels very well  Repeat PFTs with next appointment  once oral surgery healed       I discussed the plan in detail with the patient who voiced understanding and is in agreement.    >~ 25 minutes was spent on this visit.    Follow up: ~3 months, sooner if needed.         Lazaro Arms

## 2022-12-30 DIAGNOSIS — I5031 Acute diastolic (congestive) heart failure: Secondary | ICD-10-CM | POA: Diagnosis not present

## 2022-12-30 DIAGNOSIS — J441 Chronic obstructive pulmonary disease with (acute) exacerbation: Secondary | ICD-10-CM | POA: Diagnosis not present

## 2022-12-30 DIAGNOSIS — I130 Hypertensive heart and chronic kidney disease with heart failure and stage 1 through stage 4 chronic kidney disease, or unspecified chronic kidney disease: Secondary | ICD-10-CM | POA: Diagnosis not present

## 2022-12-30 DIAGNOSIS — J9621 Acute and chronic respiratory failure with hypoxia: Secondary | ICD-10-CM | POA: Diagnosis not present

## 2022-12-30 DIAGNOSIS — N1831 Chronic kidney disease, stage 3a: Secondary | ICD-10-CM | POA: Diagnosis not present

## 2022-12-30 DIAGNOSIS — E1122 Type 2 diabetes mellitus with diabetic chronic kidney disease: Secondary | ICD-10-CM | POA: Diagnosis not present

## 2022-12-31 DIAGNOSIS — D509 Iron deficiency anemia, unspecified: Secondary | ICD-10-CM | POA: Diagnosis not present

## 2023-01-01 LAB — CBC WITH DIFFERENTIAL/PLATELET
Basophils Absolute: 0.1 10*3/uL (ref 0.0–0.2)
Basos: 1 %
EOS (ABSOLUTE): 0.4 10*3/uL (ref 0.0–0.4)
Eos: 4 %
Hematocrit: 36.5 % — ABNORMAL LOW (ref 37.5–51.0)
Hemoglobin: 11.4 g/dL — ABNORMAL LOW (ref 13.0–17.7)
Immature Grans (Abs): 0 10*3/uL (ref 0.0–0.1)
Immature Granulocytes: 0 %
Lymphocytes Absolute: 1.8 10*3/uL (ref 0.7–3.1)
Lymphs: 19 %
MCH: 26.6 pg (ref 26.6–33.0)
MCHC: 31.2 g/dL — ABNORMAL LOW (ref 31.5–35.7)
MCV: 85 fL (ref 79–97)
Monocytes Absolute: 0.5 10*3/uL (ref 0.1–0.9)
Monocytes: 5 %
Neutrophils Absolute: 6.6 10*3/uL (ref 1.4–7.0)
Neutrophils: 71 %
Platelets: 360 10*3/uL (ref 150–450)
RBC: 4.28 x10E6/uL (ref 4.14–5.80)
RDW: 19.9 % — ABNORMAL HIGH (ref 11.6–15.4)
WBC: 9.4 10*3/uL (ref 3.4–10.8)

## 2023-01-01 LAB — IRON,TIBC AND FERRITIN PANEL
Ferritin: 38 ng/mL (ref 30–400)
Iron Saturation: 29 % (ref 15–55)
Iron: 106 ug/dL (ref 38–169)
Total Iron Binding Capacity: 362 ug/dL (ref 250–450)
UIBC: 256 ug/dL (ref 111–343)

## 2023-01-06 DIAGNOSIS — J441 Chronic obstructive pulmonary disease with (acute) exacerbation: Secondary | ICD-10-CM | POA: Diagnosis not present

## 2023-01-06 DIAGNOSIS — I130 Hypertensive heart and chronic kidney disease with heart failure and stage 1 through stage 4 chronic kidney disease, or unspecified chronic kidney disease: Secondary | ICD-10-CM | POA: Diagnosis not present

## 2023-01-06 DIAGNOSIS — N1831 Chronic kidney disease, stage 3a: Secondary | ICD-10-CM | POA: Diagnosis not present

## 2023-01-06 DIAGNOSIS — I5031 Acute diastolic (congestive) heart failure: Secondary | ICD-10-CM | POA: Diagnosis not present

## 2023-01-06 DIAGNOSIS — E1122 Type 2 diabetes mellitus with diabetic chronic kidney disease: Secondary | ICD-10-CM | POA: Diagnosis not present

## 2023-01-06 DIAGNOSIS — J9621 Acute and chronic respiratory failure with hypoxia: Secondary | ICD-10-CM | POA: Diagnosis not present

## 2023-01-12 DIAGNOSIS — I10 Essential (primary) hypertension: Secondary | ICD-10-CM | POA: Diagnosis not present

## 2023-01-12 DIAGNOSIS — J449 Chronic obstructive pulmonary disease, unspecified: Secondary | ICD-10-CM | POA: Diagnosis not present

## 2023-01-13 DIAGNOSIS — J441 Chronic obstructive pulmonary disease with (acute) exacerbation: Secondary | ICD-10-CM | POA: Diagnosis not present

## 2023-01-13 DIAGNOSIS — J9621 Acute and chronic respiratory failure with hypoxia: Secondary | ICD-10-CM | POA: Diagnosis not present

## 2023-01-13 DIAGNOSIS — I5031 Acute diastolic (congestive) heart failure: Secondary | ICD-10-CM | POA: Diagnosis not present

## 2023-01-13 DIAGNOSIS — I130 Hypertensive heart and chronic kidney disease with heart failure and stage 1 through stage 4 chronic kidney disease, or unspecified chronic kidney disease: Secondary | ICD-10-CM | POA: Diagnosis not present

## 2023-01-13 DIAGNOSIS — E1122 Type 2 diabetes mellitus with diabetic chronic kidney disease: Secondary | ICD-10-CM | POA: Diagnosis not present

## 2023-01-13 DIAGNOSIS — N1831 Chronic kidney disease, stage 3a: Secondary | ICD-10-CM | POA: Diagnosis not present

## 2023-01-14 ENCOUNTER — Ambulatory Visit (HOSPITAL_COMMUNITY)
Admission: RE | Admit: 2023-01-14 | Discharge: 2023-01-14 | Disposition: A | Discharge status: Home / Self Care | Payer: Medicare Other | Source: Ambulatory Visit | Attending: Internal Medicine | Admitting: Internal Medicine

## 2023-01-14 DIAGNOSIS — R0609 Other forms of dyspnea: Secondary | ICD-10-CM | POA: Insufficient documentation

## 2023-01-14 LAB — PULMONARY FUNCTION TEST
DL/VA % pred: 94 %
DL/VA: 3.81 ml/min/mmHg/L
DLCO cor % pred: 61 %
DLCO cor: 14.86 ml/min/mmHg
DLCO unc % pred: 55 %
DLCO unc: 13.32 ml/min/mmHg
FEF 25-75 Post: 0.47 L/sec
FEF 25-75 Pre: 0.58 L/sec
FEF2575-%Change-Post: -19 %
FEF2575-%Pred-Post: 21 %
FEF2575-%Pred-Pre: 26 %
FEV1-%Change-Post: -3 %
FEV1-%Pred-Post: 43 %
FEV1-%Pred-Pre: 45 %
FEV1-Post: 1.3 L
FEV1-Pre: 1.34 L
FEV1FVC-%Change-Post: -3 %
FEV1FVC-%Pred-Pre: 74 %
FEV6-%Change-Post: -3 %
FEV6-%Pred-Post: 57 %
FEV6-%Pred-Pre: 59 %
FEV6-Post: 2.19 L
FEV6-Pre: 2.28 L
FEV6FVC-%Change-Post: -3 %
FEV6FVC-%Pred-Post: 94 %
FEV6FVC-%Pred-Pre: 98 %
FVC-%Change-Post: 0 %
FVC-%Pred-Post: 60 %
FVC-%Pred-Pre: 60 %
FVC-Post: 2.46 L
FVC-Pre: 2.46 L
Post FEV1/FVC ratio: 53 %
Post FEV6/FVC ratio: 89 %
Pre FEV1/FVC ratio: 54 %
Pre FEV6/FVC Ratio: 92 %
RV % pred: 139 %
RV: 3.3 L
TLC % pred: 87 %
TLC: 5.82 L

## 2023-01-14 MED ORDER — ALBUTEROL SULFATE (2.5 MG/3ML) 0.083% IN NEBU
2.5000 mg | INHALATION_SOLUTION | Freq: Once | RESPIRATORY_TRACT | Status: AC
  Administered 2023-01-14: 2.5 mg via RESPIRATORY_TRACT

## 2023-01-16 ENCOUNTER — Ambulatory Visit (INDEPENDENT_AMBULATORY_CARE_PROVIDER_SITE_OTHER): Payer: Medicare Other | Admitting: Gastroenterology

## 2023-01-16 ENCOUNTER — Encounter (INDEPENDENT_AMBULATORY_CARE_PROVIDER_SITE_OTHER): Payer: Self-pay | Admitting: Gastroenterology

## 2023-01-16 VITALS — BP 126/81 | HR 101 | Temp 97.3°F | Ht 68.0 in | Wt 231.9 lb

## 2023-01-16 DIAGNOSIS — K5520 Angiodysplasia of colon without hemorrhage: Secondary | ICD-10-CM | POA: Diagnosis not present

## 2023-01-16 DIAGNOSIS — D500 Iron deficiency anemia secondary to blood loss (chronic): Secondary | ICD-10-CM | POA: Diagnosis not present

## 2023-01-16 NOTE — Patient Instructions (Addendum)
Continue ferrous sulfate twice a day Repeat blood workup a week prior to next appointment

## 2023-01-16 NOTE — Progress Notes (Signed)
Mark Cannon, M.D. Gastroenterology & Hepatology [facility] [address] , [place] [id number]  Primary Care Physician: Mark Cannon, Mark J, PA-C [address] [place] [id number]  I will communicate my assessment and recommendations to the referring MD via EMR.  Problems: Small bowel AVMs Iron deficiency anemia.  History of Present Illness: Mark OaksJames M Cannon is a 72 y.o. male with past medical history of small bowel AVMs, iron-deficiency anemia, prostate cancer, arthritis, diabetes, GERD, hypertension, hyperlipidemia, OSA and COPD, who presents for follow up of iron deficient anemia.  The patient was last seen on 11/11/2022. At that time, the patient was scheduled for a capsule endoscopy.  He was started on oral iron supplementation.  Given normal capsule endoscopy findings he underwent an enteroscopy and colonoscopy on 12/03/2022.  Patient reports feeling well and denies having any complaints. States his stools are black since he started taking iron. He also reports that he has presented some issues with increased frequency to have a BM since he had a prostatectomy. Stool is normal to loose in consistency. Can have up to 4 bowel movements per day. The patient denies having any regular nausea, vomiting, fever, chills, hematochezia, hematemesis, abdominal distention, abdominal pain, diarrhea, jaundice, pruritus or weight loss.  Most recent labs from 12/31/2022 showed a hemoglobin of 11.4, MCV of 85, WBC 9.4, platelets 360, iron 106, ferritin 38, iron saturation 29.  Patient is currently taking oral ferrous sulfate twice a day.  Last EGD/enteroscopy: 12/03/2022 1 cm hiatal hernia, small nodules in the duodenal bulb biopsied, had presence of 10 AVMs in the duodenum and in the proximal jejunum which were ablated with APC.  There were 2 more AVMs in the most distal area but this could not be ablated despite performing abdominal  pressure.  Last Colonoscopy: 12/03/2022  2 sessile polyps were removed from the cecum measuring 1 to 2 mm. 6 mm polyp was present in the proximal ascending colon which was removed in a piecemeal fashion. 7 polyps were removed from the rectum, transverse and ascending colon.  These measured between 3 to 12 mm in size. Path 6 colonic tubular adenomas and 2 hyperplastic polyps, repeat colonoscopy in 1 year -will need 2-day prep   Last capsule endoscopy 11/15/22 Moderate sized angiodysplastic in the proximal small bowel.   Past Medical History: Past Medical History:  Diagnosis Date   Arthritis    Cancer    Prostate, has had prostate removed, has had radiation and still has cancer,   Chronic respiratory failure    Colon polyps    Cough    Diabetes mellitus without complication    Dyspnea    GERD (gastroesophageal reflux disease)    occasional    H/O tooth extraction week of 03/15/2015    History of kidney stones    Hypercholesteremia    Hypertension    Lumbar stenosis with neurogenic claudication    Pneumonia    Pre-diabetes    Renal disorder    Sleep apnea    No cpap    Past Surgical History: Past Surgical History:  Procedure Laterality Date   APPENDECTOMY     BACK SURGERY     BIOPSY  12/03/2022   Procedure: BIOPSY;  Surgeon: Dolores Frameastaneda Mayorga, Markey Deady, MD;  [facility]: [facility];  Service: Gastroenterology;;   CHOLECYSTECTOMY     COLONOSCOPY  02/04/2012   Procedure: COLONOSCOPY;  Surgeon: Dalia HeadingMark A Jenkins, MD;  Location: [facility];  Service: Gastroenterology;  Laterality: N/A;  COLONOSCOPY N/A 09/22/2018   Procedure: COLONOSCOPY;  Surgeon: Franky Macho, MD;  [facility]: [facility];  Service: Gastroenterology;  Laterality: N/A;   COLONOSCOPY W/ POLYPECTOMY     COLONOSCOPY WITH PROPOFOL N/A 10/15/2022   Procedure: COLONOSCOPY WITH PROPOFOL;  Surgeon: Lanelle Bal, DO;  [facility];  Service: Endoscopy;  Laterality: N/A;   COLONOSCOPY WITH PROPOFOL N/A  12/03/2022   Procedure: COLONOSCOPY WITH PROPOFOL;  Surgeon: Dolores Frame, MD;  [facility];  Service: Gastroenterology;  Laterality: N/A;  12:00pm, asa 3   ENTEROSCOPY N/A 12/03/2022   Procedure: ENTEROSCOPY;  Surgeon: Dolores Frame, MD;  [facility];  Service: Gastroenterology;  Laterality: N/A;  push enteroscopy   ESOPHAGOGASTRODUODENOSCOPY (EGD) WITH PROPOFOL N/A 10/15/2022   Procedure: ESOPHAGOGASTRODUODENOSCOPY (EGD) WITH PROPOFOL;  Surgeon: Lanelle Bal, DO;  [facility];  Service: Endoscopy;  Laterality: N/A;   GIVENS CAPSULE STUDY N/A 11/14/2022   Procedure: GIVENS CAPSULE STUDY;  Surgeon: Corbin Ade, MD;  [facility];  Service: Endoscopy;  Laterality: N/A;   HEMOSTASIS CLIP PLACEMENT  12/03/2022   Procedure: HEMOSTASIS CLIP PLACEMENT;  Surgeon: Dolores Frame, MD;  [facility];  Service: Gastroenterology;;   HERNIA REPAIR     Bilateral inguinal hernia repair X 4   HOT HEMOSTASIS  12/03/2022   Procedure: HOT HEMOSTASIS (ARGON PLASMA COAGULATION/BICAP);  Surgeon: Marguerita Merles, Reuel Boom, MD;  [facility];  Service: Gastroenterology;;   hydrocelectomy     left knee arthroscopy     LITHOTRIPSY     LYMPHADENECTOMY Bilateral 03/22/2015   Procedure: PELVIC LYMPHADENECTOMY;  Surgeon: Sebastian Ache, MD;  Location: [facility];  Service: Urology;  Laterality: Bilateral;   NM MYOVIEW LTD  2011   POLYPECTOMY  09/22/2018   Procedure: POLYPECTOMY;  Surgeon: Franky Macho, MD;  [facility];  Service: Gastroenterology;;   POLYPECTOMY  10/15/2022   Procedure: POLYPECTOMY;  Surgeon: Lanelle Bal, DO;  [facility];  Service: Endoscopy;;   POLYPECTOMY  12/03/2022   Procedure: POLYPECTOMY;  Surgeon: Dolores Frame, MD;  [facility];  Service: Gastroenterology;;   ROBOT ASSISTED LAPAROSCOPIC RADICAL PROSTATECTOMY N/A 03/22/2015   Procedure:  ROBOTIC ASSISTED LAPAROSCOPIC RADICAL PROSTATECTOMY WITH INDOCYANINE GREEN DYE INJECTION;  Surgeon: Sebastian Ache, MD;  Location: [facility];  Service: Urology;  Laterality: N/A;   ROBOTIC ASSISTED LAPAROSCOPIC LYSIS OF ADHESION  03/22/2015   Procedure: ROBOTIC ASSISTED LAPAROSCOPIC LYSIS OF ADHESION;  Surgeon: Sebastian Ache, MD;  Location: [facility];  Service: Urology;;   SHOULDER SURGERY Right    SUBMUCOSAL LIFTING INJECTION  12/03/2022   Procedure: SUBMUCOSAL LIFTING INJECTION;  Surgeon: Marguerita Merles, Reuel Boom, MD;  [facility];  Service: Gastroenterology;;   VASECTOMY      Family History: Family History  Problem Relation Age of Onset   Heart failure Father    Stroke Father    Colon cancer Neg Hx     Social History: Social History   Tobacco Use  Smoking Status Former   Packs/day: 1.00   Years: 55.00   Additional pack years: 0.00   Total pack years: 55.00   Types: Cigarettes   Passive exposure: Past  Smokeless Tobacco Never   Social History   Substance and Sexual Activity  Alcohol Use Not Currently   Alcohol/week: 1.0 standard drink of alcohol   Types: 1 Glasses of wine per week   Comment: one glass of wine once a month  Social History   Substance and Sexual Activity  Drug Use No    Allergies: Allergies  Allergen Reactions   Lidocaine Hives   Benzocaine Other (See Comments)    Blisters    Benzocaine (Topical)    Other     Novocaine - blisters in mouth    Medications: Current Outpatient Medications  Medication Sig Dispense Refill   amLODipine (NORVASC) 5 MG tablet Take 1 tablet (5 mg total) by mouth daily. 30 tablet 1   docusate sodium (COLACE) 100 MG capsule Take 100 mg by mouth daily.     ferrous sulfate 325 (65 FE) MG tablet Take 1 tablet (325 mg total) by mouth 3 (three) times daily with meals. 90 tablet 2   furosemide (LASIX) 40 MG tablet Take 1 tablet (40 mg total) by mouth daily. 30 tablet 1   glimepiride (AMARYL) 1 MG tablet Take 1 mg  by mouth 2 (two) times daily.     HYDROcodone-acetaminophen (NORCO) 10-325 MG tablet Take 1 tablet by mouth 4 (four) times daily as needed for moderate pain.     OXYGEN Inhale 3 L into the lungs continuous.     pantoprazole (PROTONIX) 40 MG tablet Take 1 tablet (40 mg total) by mouth daily. 30 tablet 2   PRESCRIPTION MEDICATION Hormone therapy injection every 6 months through [facility] due to prostate cancer.     spironolactone (ALDACTONE) 25 MG tablet Take 1 tablet (25 mg total) by mouth 2 (two) times daily. 60 tablet 1   No current facility-administered medications for this visit.    Review of Systems: GENERAL: negative for malaise, night sweats HEENT: No changes in hearing or vision, no nose bleeds or other nasal problems. NECK: Negative for lumps, goiter, pain and significant neck swelling RESPIRATORY: Negative for cough, wheezing CARDIOVASCULAR: Negative for chest pain, leg swelling, palpitations, orthopnea GI: SEE HPI MUSCULOSKELETAL: Negative for joint pain or swelling, back pain, and muscle pain. SKIN: Negative for lesions, rash PSYCH: Negative for sleep disturbance, mood disorder and recent psychosocial stressors. HEMATOLOGY Negative for prolonged bleeding, bruising easily, and swollen nodes. ENDOCRINE: Negative for cold or heat intolerance, polyuria, polydipsia and goiter. NEURO: negative for tremor, gait imbalance, syncope and seizures. The remainder of the review of systems is noncontributory.   Physical Exam: BP 126/81 (BP Location: Left Arm, Patient Position: Sitting, Cuff Size: Large)   Pulse (!) 101   Temp (!) 97.3 F (36.3 C) (Temporal)   Ht 5\' 8"  (1.727 m)   Wt 231 lb 14.4 oz (105.2 kg)   BMI 35.26 kg/m  GENERAL: The patient is AO x3, in no acute distress. Obese.  HEENT: Head is normocephalic and atraumatic. EOMI are intact. Mouth is well hydrated and without lesions. NECK: Supple. No masses LUNGS: Clear to auscultation. No presence of  rhonchi/wheezing/rales. Adequate chest expansion HEART: RRR, normal s1 and s2. ABDOMEN: Soft, nontender, no guarding, no peritoneal signs, and nondistended. BS +. No masses. EXTREMITIES: Without any cyanosis, clubbing, rash, lesions or edema. NEUROLOGIC: AOx3, no focal motor deficit. SKIN: no jaundice, no rashes  Imaging/Labs: as above  I personally reviewed and interpreted the available labs, imaging and endoscopic files.  Impression and Plan: READ BONELLI is a 72 y.o. male with past medical history of small bowel AVMs, iron-deficiency anemia, prostate cancer, arthritis, diabetes, GERD, hypertension, hyperlipidemia, OSA and COPD, who presents for follow up of iron deficiency anemia.  The patient was found to have multiple AVMs in the duodenum and the proximal jejunum during his most  recent push enteroscopy which were ablated with APC successfully, although there were 2 more AVMs that could not be reached due to significant looping.  Since then, he has presented improvement of his iron stores and hemoglobin while taking iron twice daily.  He has not presented any signs of overt gastrointestinal bleeding.  At this point, we will continue him on oral iron supplementation twice a day and we will recheck his cell count and iron stores prior to his next follow-up appointment.  Notably, he had a moderately large colon polyp removed in a piecemeal fashion during his last colonoscopy.  He will be due for repeat colonoscopy in 2025.  - Continue ferrous sulfate 325 mg twice a day - Repeat CBC and iron stores a week prior to next appointment  All questions were answered.      Mark Blazing, MD Gastroenterology and Hepatology [facility]

## 2023-01-20 NOTE — Progress Notes (Unsigned)
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 Had previously eval by Juanetta Gosling with dx of COPD but never pfts nor need for resp meds though very sedentary due to back pain     History of Present Illness  08/08/2022  Pulmonary/ 1st office eval/ Dava Rensch / [place] [facility]  Chief Complaint  Patient presents with   Consult    Copd exacerbation and pneumonia of LL lobe  Uses 3LO2 cont 24/7   Dyspnea:  back to baseline but 02 at 3 (was 4 lpm)  Cough: none  Sleep: flat  SABA use: has duoneb not using  02 3lpm  Presyncope since d/c  Chronic L > R leg swelling no change  Rec Go to ER:  Admit date:     08/08/2022  Discharge date: 08/09/22   Discharge Diagnoses: Principal Problem:   Symptomatic anemia   Acute respiratory failure with hypoxia (HCC)   Essential hypertension   GERD (gastroesophageal reflux disease)   Iron deficiency anemia   Brief Hospital Course: Mark Cannon is a 72 y.o. male with medical history significant of arthritis, cancer, GERD, hyperlipidemia, hypertension presents to the ED with a chief complaint of difficulty breathing.  Patient reports that has been gradually in onset over the last 2 weeks.  The dyspnea is exertional.  His oxygen sats dropped down to 75% while he is exerting himself.  Sitting down situating his oxygen and trying to rest makes it better.  He reports the lowest oxygen setting he is seen is 65% that was just prior to his previous hospitalization.  Patient reports no fevers, no cough.  He is wearing 4 L nasal cannula at baseline at home.  He reports he is not using his nebulizer because he ran out of medication for it.  He does not have any chest pains.  He does have some dizziness.  He has had no loss of consciousness.  No change in vision.  He reports he does  have a ringing in his ear sometimes.  Patient reports that he did quit smoking last month.  He has orthopnea at home that he reports is resolved but laying on his left or right side.  He reports no peripheral edema.  Patient reports a family history with his mother dying of "low hemoglobin."  According to patient, mother had been anemic for the last couple years of her life and had to have recurrent transfusions and there was never any etiology identified.   Patient does not smoke, does not drink, does not use illicit drugs.  He is vaccinated for COVID.  Patient is full code.   Assessment and Plan: * Symptomatic anemia -Hemoglobin down to 7.6 - Latest hemoglobin about 1 month ago around 11 - Excellent response to 1 unit PRBC transfusion during hospitalization -At discharge hemoglobin 8.9  -No signs of overt bleeding appreciated, patient's symptoms completely improved/resolved. -Case discussed with gastroenterology who recommended outpatient work-up for anemia and endoscopic evaluation if needed.   -Continue PPI and avoid any NSAIDs outside the use of baby enteric-coated aspirin.   -IV iron x1 also provided while inpatient as anemia panel demonstrated component of iron deficiency.   Obesity, Class I, BMI 30-34.9 -Body mass index is 34.86 kg/m. -Low-calorie diet, portion control and  increase physical activity discussed with patient.   Iron deficiency anemia - Anemia panel demonstrating component of iron deficiency anemia -Endoscopic evaluation GI work-up for anemia to be pursued as an outpatient -Continue PPI -Fecal occult blood test negative -Patient hemodynamically stable.   GERD (gastroesophageal reflux disease) -Continue Protonix -Lifestyle changes discussed with patient.   Essential hypertension -Resume home antihypertensive regimen. -Heart healthy diet discussed with patient.   Acute respiratory failure with hypoxia (HCC) -On oxygen at home since last month -Stable saturation  appreciated -Continue 4 L nasal cannula supplementation -Continue as needed bronchodilators -Very likely component of hypoxia unsure when the sensation driven by anemia. -Patient feeling ready to go home currently. -Outpatient follow-up with pulmonologist recommended. -No acute abnormalities appreciated on chest images.     Admit date:     11/13/2022  Discharge date: 11/15/22    Recommendations at discharge:    Gastroenterologist in next 2-3 days-retrieving capsule endoscopy and results Labs CBC, BMP in 1 week results to PCP Monitor daily weight, continue supplemental oxygen (continuous 4 L by nasal cannula) Home health, PT OT, Follow-up with pulmonologist -results of pleural fluid   Discharge Diagnoses: Principal Problem:   Acute on chronic respiratory failure with hypoxia (HCC)   Acute heart failure with preserved ejection fraction (HFpEF) (HCC)   Prostate cancer (HCC)   Lumbar stenosis with neurogenic claudication   Rectal bleeding   Stroke (cerebrum) (HCC)   COPD with acute exacerbation (HCC)   Essential hypertension   GERD (gastroesophageal reflux disease)   Iron deficiency anemia   Class 2 obesity   Acute on chronic respiratory failure [facility])     Hospital Course: Mark Cannon  is a 72 year old male with h/o HTN, HLD,   prostate cancer (S/P prostatectomy), OSA, CKD stage III, chronic respiratory failure on 4 L, tobacco abuse in remission presenting with progressive shortness of breath over past 24 hours. Baseline O2 demand 4 L, up to 6 at home and now up to 10 L of oxygen.   Reporting similar presentation with hypoxia and anemia in the past 2 hospitalization October 2023 and October 15, 2022... He was subsequently discharged on 4 L of oxygen In October he was transfused with 1 unit of PRBC, back in January he was transfused with 2 units of PRBC.    GI workup was deferred to the outpatient setting.  Colonoscopy was planned given his history of iron deficiency anemia.   Subsequently was canceled. He has not had any follow-up since.    He presents again with generalized weakness and dyspnea.  He denies any hematochezia, melena, hematemesis, hematuria.  He has not had any fevers, chills, chest pain, nausea, vomiting, diarrhea, abdominal pain.  He denies any NSAIDs.     ED evaluation: Blood pressure (!) 147/60, pulse 95, Temp. 98.1 F (36.7 C), RR 15, SpO2 100 % on 10 L of oxygen HFNC    WBC 7.9, hemoglobin 7.6, hematocrit 28.5, calcium 8.6, troponin 19, glucose 183, BNP 183.0   Hemoccult positive X 2     Respiratory panel : COVID, RSV, Flu were all negative CTA: Negative for pulmonary embolism, pleural effusion, fluid overload.   He was started on IV Lasix       Assessment and Plan:   Acute on chronic respiratory failure with hypoxia -Much improved -due to fluid overload/pleural effusion -Status post thoracentesis yielding 900 mL, IV Lasix -Check COVID, RSV, Flu--all negative -CTA: Bilateral pleural effusion, congestion,, negative for pulmonary embolism -at [facility] line O2  4L a -Now on 10 L HFNC>> down back to baseline of 4 L of oxygen, satting 93%   -Continue as needed DuoNeb bronchodilator treatments     Bilateral pleural effusion  -Status post thoracentesis from the right, yielding 900 mL fluid, pending fluid cytology and analysis _Pleural fluid cloudy, monocytes of 35, glucose 145, LD 67, lymphocyte of 51, neutrophil of 14, (Thus far not consistent with infection)  -Follow-up with pulmonology - Dr. Sherene SiresWert for further evaluation recommendations   symptomatic anemia -Patient has chronic blood loss -Hemoglobin 8.4 at the time of his discharge on 10/15/2022 - Hemoccult positive X 2     Latest Ref Rng & Units 11/14/2022    7:06 AM 11/13/2022   11:36 AM 10/25/2022    2:26 PM  CBC  WBC 4.0 - 10.5 K/uL 7.7  7.9  7.4   Hemoglobin 13.0 - 17.0 g/dL 9.5  7.6  8.2   Hematocrit 39.0 - 52.0 % 33.2  28.5  28.0   Platelets 150 - 400 K/uL 237  249   413         -ferritin 8, iron sat 23% -GI consult appreciated   -Status post transfusion of 2U PRBC  (On Last admission was also transfused with 2U PRBC -pantoprazole I -10/15/22 EGD--normal -10/15/22 colonoscopy--8 polyps removed. +diverticulosis -GI consulted following,  -Capsule endoscopy 11/14/2022 -yet to be retrieved -to be retrieved, Per GI outpatient follow-up capsule equipment can be returned to endoscopy       Acute HFpEF -started on IV lasix >>> will be discontinued today 2-24 -05/15/22 Echo--EF 60-65%, G1DD, no WMA, normal RVF -10/14/22 ReDS = 36          -Resuming home with lasix 40 mg po daily   CKD stage IIIa -Baseline creatinine 1.1-1.4 -monitor with diuresis -serum creatinine 1.103 >> 0.95 >> 1.05,   Pulmonary nodular infiltrates -12/31 CT chest--RUL mass like consolidations -CTA: No mention of nodules,Bilateral pleural effusion, congestion,, negative for pulmonary embolism    Uncontrolled diabetes mellitus type 2 with hyperglycemia -Resume  Amaryl-- -05/14/2022 A1c 7.7 -12/30 Repeat A1c--7.3 - SSI    Iron deficiency Anemia -iron sat 23 -ferritin 8 -Cont.  ferrous sulfate   Essential hypertension -restarted amlodipine 5 mg daily   COPD -stable -he is not on any maintenance medications at home -Quit smoking in September 2023 after 50 pack years     12/09/2022  post hosp f/u ov/Mark Cannon re: doe/newly on 02 since pna fall 2023   maint on no resp meds/ never  had pfts  Chief Complaint  Patient presents with   Follow-up    CT scan  Feeling less SOB    Dyspnea:  limited by lower back/  Cough: just  clearing throat / not noticeable while sleeping  Sleeping: bed is level/ one pillow  SABA use: has duoneb not using  02: wearing 4lpm 24/7  Covid status:   JJ  Rec My office will be contacting you by phone for referral to PFT  and overnight oximetry  Make sure you check your oxygen saturation  AT  your highest level of activity (not after you stop)   to be  sure it stays over 90%  Keep some candy handy :    01/21/2023  f/u ov/Mark Cannon re: GOLD 3 copd  maint on no rx x for noct 02   Chief Complaint  Patient presents with   Follow-up    Breathing has improved. He has occ dry cough, PND.    Dyspnea:  home bound mostly / 75 ft gives out freq with fainty sensation > sob and not checking sats  Cough: none  Sleeping: level bed one pillow s resp cc  SABA use: none  02: 3lpm at hs    Lung cancer screening: in program per PCP or urology   No obvious day to day or daytime variability or assoc excess/ purulent sputum or mucus plugs or hemoptysis or cp or chest tightness, subjective wheeze or overt  hb symptoms.   Sleeping  without nocturnal  or early am exacerbation  of respiratory  c/o's or need for noct saba. Also denies any obvious fluctuation of symptoms with weather or environmental changes or other aggravating or alleviating factors except as outlined above   No unusual exposure hx or h/o childhood pna/ asthma or knowledge of premature birth.  Current Allergies, Complete Past Medical History, Past Surgical History, Family History, and Social History were reviewed in [employer] record.  ROS  The following are not active complaints unless bolded Hoarseness, sore throat, dysphagia, dental problems, itching, sneezing,  nasal congestion or discharge of excess mucus or purulent secretions, ear ache,   fever, chills, sweats, unintended wt loss or wt gain, classically pleuritic or exertional cp,  orthopnea pnd or arm/hand swelling  or leg swelling,  palpitations, abdominal pain, anorexia, nausea, vomiting, diarrhea  or change in bowel habits or change in bladder habits, change in stools or change in urine, dysuria, hematuria,  rash, arthralgias, visual complaints, headache, numbness, weakness or ataxia or problems with walking or coordination/ presyncope  change in mood or  memory.        Current Meds  Medication Sig    amLODipine (NORVASC) 5 MG tablet Take 1 tablet (5 mg total) by mouth daily.   docusate sodium (COLACE) 100 MG capsule Take 100 mg by mouth daily.   ferrous sulfate 325 (65 FE) MG tablet Take 1 tablet (325 mg total) by mouth 3 (three) times daily with meals.   furosemide (LASIX) 40 MG tablet Take 1 tablet (40 mg total) by mouth daily.   glimepiride (AMARYL) 1 MG tablet Take 1 mg by mouth 2 (two) times daily.   HYDROcodone-acetaminophen (NORCO) 10-325 MG tablet Take 1 tablet by mouth 4 (four) times daily as needed for moderate pain.   OXYGEN Inhale 3 L into the lungs continuous.   pantoprazole (PROTONIX) 40 MG tablet Take 1 tablet (40 mg total) by mouth daily.   spironolactone (ALDACTONE) 25 MG tablet Take 1 tablet (25 mg total) by mouth 2 (two) times daily.                Past Medical History:  Diagnosis Date   Arthritis    Cancer [facility])    Prostate, has had prostate removed, has had radiation and still has cancer,   Colon polyps    Cough    GERD (gastroesophageal reflux disease)    occasional    H/O tooth extraction week of 03/15/2015    History of kidney stones    Hypercholesteremia    Hypertension    Lumbar stenosis with neurogenic claudication    Pneumonia    Pre-diabetes    Renal disorder    Sleep apnea    No cpap       Objective:    Wts  01/21/2023         227   12/09/22 228 lb (103.4 kg)  12/03/22 225 lb (102.1 kg)  11/29/22 218 lb 11.1 oz (99.2 kg)  Vital signs reviewed  01/21/2023  - Note at rest 02 sats  100% on RA   General appearance:    obese chronically ill appearing amb mod obese wm walks slow pace with cane  HEENT : Oropharynx  clear      NECK :  without  apparent JVD/ palpable Nodes/TM    LUNGS: no acc muscle use,  Mild barrel  contour chest wall with bilateral  Distant bs s audible wheeze and  without cough on insp or exp maneuvers  and mild  Hyperresonant  to  percussion bilaterally     CV:  RRR  no s3 or murmur or increase in P2, and no  edema   ABD:  obese  soft and nontender    MS:    ext warm without deformities Or obvious joint restrictions  calf tenderness, cyanosis or clubbing     SKIN: warm and dry without lesions    NEURO:  alert, approp, nl sensorium with  no motor or cerebellar deficits apparent.             Assessment

## 2023-01-21 ENCOUNTER — Ambulatory Visit (INDEPENDENT_AMBULATORY_CARE_PROVIDER_SITE_OTHER): Payer: Medicare Other | Admitting: Internal Medicine

## 2023-01-21 ENCOUNTER — Encounter: Payer: Self-pay | Admitting: Internal Medicine

## 2023-01-21 VITALS — BP 130/74 | HR 100 | Ht 66.0 in | Wt 227.0 lb

## 2023-01-21 DIAGNOSIS — R0609 Other forms of dyspnea: Secondary | ICD-10-CM

## 2023-01-21 DIAGNOSIS — G4734 Idiopathic sleep related nonobstructive alveolar hypoventilation: Secondary | ICD-10-CM | POA: Diagnosis not present

## 2023-01-21 MED ORDER — TRELEGY ELLIPTA 200-62.5-25 MCG/ACT IN AEPB: 1.0000 | INHALATION_SPRAY | Freq: Every day | RESPIRATORY_TRACT | 0 refills | Status: DC

## 2023-01-21 MED ORDER — ANORO ELLIPTA 62.5-25 MCG/ACT IN AEPB: 1.0000 | INHALATION_SPRAY | Freq: Every day | RESPIRATORY_TRACT | 11 refills | Status: DC

## 2023-01-21 NOTE — Assessment & Plan Note (Addendum)
D/c on 02  on 11/15/22 - on 02 3lpm hs with no ex desats  on RA as of  12/09/22  - ONO on 3lpm requested again 01/21/2023    Pulmonary f/u is prn   Each maintenance medication was reviewed in detail including emphasizing most importantly the difference between maintenance and prns and under what circumstances the prns are to be triggered using an action plan format where appropriate.  Total time for H and P, chart review, counseling, reviewing dpi/02 device(s) and generating customized AVS unique to this office visit / same day charting > 30 min final summary  f/u ov.

## 2023-01-21 NOTE — Patient Instructions (Addendum)
Trelegy 200 one click - take two good drags 1st thing in am - if breathing better then ok on sample then ok to  try Anoro one click each am  - rinse mouth after use   My office will be contacting you by phone for referral to overnight   - if you don't hear back from my office within one week please call us back or notify us thru MyChart and we'll address it right away.    If you are satisfied with your treatment plan,  let your doctor know and he/she can either refill your medications or you can return here when your prescription runs out.     If in any way you are not 100% satisfied,  please tell us.  If 100% better, tell your friends!  Pulmonary follow up is as needed

## 2023-01-21 NOTE — Assessment & Plan Note (Signed)
Quit smoking Sept 14 2023  -  Onset of symptoms  2023  -  Echo 05/15/22 grad 1 diastolic dysfunction  - B R >> L pleural effusions:  R Tap 11/13/22  900 cc transudate in setting of severe anemia attributed to polyps > removed at admit  - 12/09/2022   Walked on ra   approx 75  ft  @ slow  pace, stopped due to feeling fainty with lowest 02 sats 96%  - PFT's  01/14/23  FEV1 1.34 (45 % ) ratio 0.54  p 0 % improvement from saba p 0 prior to study with DLCO  13.32 (55%)   and FV curve classically concave  and ERV 46%   at wt 230   - 01/21/2023  After extensive coaching inhaler device,  effectiveness =    75% (short ti with dpi > try telegy 200 sample then anoro 1 click each am and f/u prn   Pt is Group B in terms of symptom/risk and laba/lama therefore appropriate rx at this point >>>  anoro one click each am (given trelegy sample to learn technique) for purpose of imporving ex tol but acknowledging up front his airflow issues may not be a large component of his acitivity intol and planning cards eval next/ which I encouraged him to pursue.  Advised: As I explained to this patient in detail:  although there is  copd present, it may not be clinically relevant:   it does not appear to be limiting activity tolerance any more than a set of worn tires limits someone from driving a car  around a parking lot.  A new set of Michelins might look good but would have no perceived impact on the performance of the car and would not be worth the cost.  That is to say:   this pt is so sedentary I don't recommend aggressive pulmonary rx at this point unless limiting symptoms arise or acute exacerbations become as issue, neither of which is the case now.  I asked the patient to contact this office at any time in the future should either of these problems arise.     Pulmonary f/u can be prn

## 2023-01-30 DIAGNOSIS — R7309 Other abnormal glucose: Secondary | ICD-10-CM | POA: Diagnosis not present

## 2023-01-30 DIAGNOSIS — E039 Hypothyroidism, unspecified: Secondary | ICD-10-CM | POA: Diagnosis not present

## 2023-01-30 DIAGNOSIS — I5021 Acute systolic (congestive) heart failure: Secondary | ICD-10-CM | POA: Diagnosis not present

## 2023-01-30 DIAGNOSIS — K922 Gastrointestinal hemorrhage, unspecified: Secondary | ICD-10-CM | POA: Diagnosis not present

## 2023-01-30 DIAGNOSIS — R54 Age-related physical debility: Secondary | ICD-10-CM | POA: Diagnosis not present

## 2023-02-05 ENCOUNTER — Other Ambulatory Visit: Payer: Self-pay

## 2023-02-05 ENCOUNTER — Telehealth: Payer: Self-pay | Admitting: Internal Medicine

## 2023-02-05 DIAGNOSIS — G4734 Idiopathic sleep related nonobstructive alveolar hypoventilation: Secondary | ICD-10-CM

## 2023-02-05 NOTE — Telephone Encounter (Signed)
ATC pt LVM letting him know I have ordered the ONO and once it is completed we will receive the results and proceed further.

## 2023-02-05 NOTE — Telephone Encounter (Signed)
Pt. Calling about   Pulse oximetry, overnigh  They need the script sent over to them for this to get done o2 company [phone]

## 2023-02-05 NOTE — Telephone Encounter (Signed)
  Order:  ONO on 3lpm

## 2023-02-06 ENCOUNTER — Telehealth: Payer: MEDICARE

## 2023-02-06 NOTE — Telephone Encounter
Call Back Request      Reason for call back: Robin pt assistant calling following up on new cpap machine, pt has not heard anything regarding this    Zella Ball advised we can't call pt back at preferred phone number     Any Symptoms:  []  Yes  [x]  No      If yes, what symptoms are you experiencing:    Duration of symptoms (how long):    Have you taken medication for symptoms (OTC or Rx):      If call was taken outside of clinic hours:    [] Patient or caller has been notified that this message was sent outside of normal clinic hours.     [] Patient or caller has been warm transferred to the physician's answering service. If applicable, patient or caller informed to please call us back if symptoms progress.  Patient or caller has been notified of the turnaround time of 1-2 business day(s).

## 2023-02-10 ENCOUNTER — Ambulatory Visit: Payer: Medicare Other | Attending: Internal Medicine | Admitting: Internal Medicine

## 2023-02-10 ENCOUNTER — Telehealth: Payer: Self-pay | Admitting: Internal Medicine

## 2023-02-10 ENCOUNTER — Other Ambulatory Visit (INDEPENDENT_AMBULATORY_CARE_PROVIDER_SITE_OTHER): Payer: Medicare Other

## 2023-02-10 ENCOUNTER — Encounter: Payer: Self-pay | Admitting: Internal Medicine

## 2023-02-10 ENCOUNTER — Ambulatory Visit: Payer: MEDICARE

## 2023-02-10 VITALS — BP 128/66 | HR 68 | Ht 67.0 in | Wt 235.0 lb

## 2023-02-10 DIAGNOSIS — E1129 Type 2 diabetes mellitus with other diabetic kidney complication: Secondary | ICD-10-CM | POA: Insufficient documentation

## 2023-02-10 DIAGNOSIS — I5032 Chronic diastolic (congestive) heart failure: Secondary | ICD-10-CM

## 2023-02-10 DIAGNOSIS — R42 Dizziness and giddiness: Secondary | ICD-10-CM

## 2023-02-10 DIAGNOSIS — E1121 Type 2 diabetes mellitus with diabetic nephropathy: Secondary | ICD-10-CM

## 2023-02-10 DIAGNOSIS — E781 Pure hyperglyceridemia: Secondary | ICD-10-CM | POA: Diagnosis not present

## 2023-02-10 DIAGNOSIS — G4733 Obstructive sleep apnea (adult) (pediatric): Secondary | ICD-10-CM

## 2023-02-10 DIAGNOSIS — Z136 Encounter for screening for cardiovascular disorders: Secondary | ICD-10-CM

## 2023-02-10 DIAGNOSIS — I5033 Acute on chronic diastolic (congestive) heart failure: Secondary | ICD-10-CM | POA: Insufficient documentation

## 2023-02-10 MED ORDER — LISINOPRIL 5 MG PO TABS: 5.0000 mg | ORAL_TABLET | Freq: Every day | ORAL | 3 refills | Status: DC

## 2023-02-10 MED ORDER — AMLODIPINE BESYLATE 2.5 MG PO TABS: 2.5000 mg | ORAL_TABLET | Freq: Every day | ORAL | 3 refills | Status: DC

## 2023-02-10 MED ORDER — ROSUVASTATIN CALCIUM 40 MG PO TABS: 40.0000 mg | ORAL_TABLET | Freq: Every day | ORAL | 3 refills | Status: DC

## 2023-02-10 NOTE — Telephone Encounter (Signed)
Pharmacy notified that Spironolactone was discontinued today at OV. Ok to fill Lisinopril 5 mg tablets. Pharmacy had no further questions or concerns.

## 2023-02-10 NOTE — Progress Notes (Signed)
Cardiology Office Note  Date: 02/10/2023   ID: Alper, Guilmette 09-03-1951, MRN [id number]  PCP:  Avis Epley, PA-C  Cardiologist:  Marjo Bicker, MD Electrophysiologist:  None   Reason for Office Visit: Evaluation of SVT at the request of Jean Rosenthal, [place]   History of Present Illness: Mark Cannon is a 72 y.o. male known to have HTN, DM 2, OSA not on CPAP but on chronic oxygen therapy, COPD, chronic diastolic heart failure was referred to cardiology clinic for evaluation of SVT.  I reviewed the EKGs from 2024 that showed NSR with PACs and another EKG that showed brief multifocal atrial tachycardia.  Patient does not have any symptoms of palpitations. He does have symptoms of dizziness almost every day. He checked his blood pressure at that time which was normal. He did not have any Accu-Cheks at home to check his blood glucose.  He denies any chest pains, DOE, leg swelling, syncope.  No prior history of ischemia evaluation.  No prior history of MI/PCI/CABG.  Denies smoking status, alcohol use and illicit drug abuse.  Past Medical History:  Diagnosis Date   Arthritis    Cancer [facility])    Prostate, has had prostate removed, has had radiation and still has cancer,   Chronic respiratory failure (HCC)    Colon polyps    Cough    Diabetes mellitus without complication (HCC)    Dyspnea    GERD (gastroesophageal reflux disease)    occasional    H/O tooth extraction week of 03/15/2015    History of kidney stones    Hypercholesteremia    Hypertension    Lumbar stenosis with neurogenic claudication    Pneumonia    Pre-diabetes    Renal disorder    Sleep apnea    No cpap    Past Surgical History:  Procedure Laterality Date   APPENDECTOMY     BACK SURGERY     BIOPSY  12/03/2022   Procedure: BIOPSY;  Surgeon: Dolores Frame, MD;  [facility];  Service: Gastroenterology;;   CHOLECYSTECTOMY     COLONOSCOPY  02/04/2012   Procedure: COLONOSCOPY;   Surgeon: Dalia Heading, MD;  [facility];  Service: Gastroenterology;  Laterality: N/A;   COLONOSCOPY N/A 09/22/2018   Procedure: COLONOSCOPY;  Surgeon: Franky Macho, MD;  [facility];  Service: Gastroenterology;  Laterality: N/A;   COLONOSCOPY W/ POLYPECTOMY     COLONOSCOPY WITH PROPOFOL N/A 10/15/2022   Procedure: COLONOSCOPY WITH PROPOFOL;  Surgeon: Lanelle Bal, DO;  [facility];  Service: Endoscopy;  Laterality: N/A;   COLONOSCOPY WITH PROPOFOL N/A 12/03/2022   Procedure: COLONOSCOPY WITH PROPOFOL;  Surgeon: Dolores Frame, MD;  [facility];  Service: Gastroenterology;  Laterality: N/A;  12:00pm, asa 3   ENTEROSCOPY N/A 12/03/2022   Procedure: ENTEROSCOPY;  Surgeon: Dolores Frame, MD;  [facility];  Service: Gastroenterology;  Laterality: N/A;  push enteroscopy   ESOPHAGOGASTRODUODENOSCOPY (EGD) WITH PROPOFOL N/A 10/15/2022   Procedure: ESOPHAGOGASTRODUODENOSCOPY (EGD) WITH PROPOFOL;  Surgeon: Lanelle Bal, DO;  [facility];  Service: Endoscopy;  Laterality: N/A;   GIVENS CAPSULE STUDY N/A 11/14/2022   Procedure: GIVENS CAPSULE STUDY;  Surgeon: Corbin Ade, MD;  [facility];  Service: Endoscopy;  Laterality: N/A;   HEMOSTASIS CLIP PLACEMENT  12/03/2022   Procedure: HEMOSTASIS CLIP PLACEMENT;  Surgeon: Dolores Frame, MD;  [facility];  Service:  Gastroenterology;;   HERNIA REPAIR     Bilateral inguinal hernia repair X 4   HOT HEMOSTASIS  12/03/2022   Procedure: HOT HEMOSTASIS (ARGON PLASMA COAGULATION/BICAP);  Surgeon: Marguerita Merles, Reuel Boom, MD;  [facility];  Service: Gastroenterology;;   hydrocelectomy     left knee arthroscopy     LITHOTRIPSY     LYMPHADENECTOMY Bilateral 03/22/2015   Procedure: PELVIC LYMPHADENECTOMY;  Surgeon: Sebastian Ache, MD;  [facility];  Service: Urology;  Laterality: Bilateral;   NM MYOVIEW LTD  2011    POLYPECTOMY  09/22/2018   Procedure: POLYPECTOMY;  Surgeon: Franky Macho, MD;  [facility];  Service: Gastroenterology;;   POLYPECTOMY  10/15/2022   Procedure: POLYPECTOMY;  Surgeon: Lanelle Bal, DO;  [facility];  Service: Endoscopy;;   POLYPECTOMY  12/03/2022   Procedure: POLYPECTOMY;  Surgeon: Dolores Frame, MD;  [facility];  Service: Gastroenterology;;   ROBOT ASSISTED LAPAROSCOPIC RADICAL PROSTATECTOMY N/A 03/22/2015   Procedure: ROBOTIC ASSISTED LAPAROSCOPIC RADICAL PROSTATECTOMY WITH INDOCYANINE GREEN DYE INJECTION;  Surgeon: Sebastian Ache, MD;  [facility];  Service: Urology;  Laterality: N/A;   ROBOTIC ASSISTED LAPAROSCOPIC LYSIS OF ADHESION  03/22/2015   Procedure: ROBOTIC ASSISTED LAPAROSCOPIC LYSIS OF ADHESION;  Surgeon: Sebastian Ache, MD;  [facility];  Service: Urology;;   SHOULDER SURGERY Right    SUBMUCOSAL LIFTING INJECTION  12/03/2022   Procedure: SUBMUCOSAL LIFTING INJECTION;  Surgeon: Marguerita Merles, Reuel Boom, MD;  [facility];  Service: Gastroenterology;;   VASECTOMY      Current Outpatient Medications  Medication Sig Dispense Refill   amLODipine (NORVASC) 2.5 MG tablet Take 1 tablet (2.5 mg total) by mouth daily. 90 tablet 3   bisoprolol (ZEBETA) 5 MG tablet Take 5 mg by mouth daily.     docusate sodium (COLACE) 100 MG capsule Take 100 mg by mouth daily.     ferrous sulfate 325 (65 FE) MG tablet Take 1 tablet (325 mg total) by mouth 3 (three) times daily with meals. 90 tablet 2   furosemide (LASIX) 40 MG tablet Take 1 tablet (40 mg total) by mouth daily. 30 tablet 1   glimepiride (AMARYL) 1 MG tablet Take 1 mg by mouth 2 (two) times daily.     HYDROcodone-acetaminophen (NORCO) 10-325 MG tablet Take 1 tablet by mouth 4 (four) times daily as needed for moderate pain.     lisinopril (ZESTRIL) 5 MG tablet Take 1 tablet (5 mg total) by mouth daily. 90 tablet 3   OXYGEN Inhale 3 L into the lungs  continuous.     pantoprazole (PROTONIX) 40 MG tablet Take 1 tablet (40 mg total) by mouth daily. 30 tablet 2   rosuvastatin (CRESTOR) 40 MG tablet Take 1 tablet (40 mg total) by mouth daily. 90 tablet 3   Fluticasone-Umeclidin-Vilant (TRELEGY ELLIPTA) 200-62.5-25 MCG/ACT AEPB Inhale 1 puff into the lungs daily. 14 each 0   umeclidinium-vilanterol (ANORO ELLIPTA) 62.5-25 MCG/ACT AEPB Inhale 1 puff into the lungs daily. 1 each 11   No current facility-administered medications for this visit.   Allergies:  Lidocaine, Benzocaine, Benzocaine, and Other   Social History: The patient  reports that he has quit smoking. His smoking use included cigarettes. He has a 55.00 pack-year smoking history. He has been exposed to tobacco smoke. He has never used smokeless tobacco. He reports that he does not currently use alcohol after a past usage of about 1.0 standard drink of alcohol per week. He reports  that he does not use drugs.   Family History: The patient's family history includes Heart failure in his father; Stroke in his father.   ROS:  Please see the history of present illness. Otherwise, complete review of systems is positive for none.  All other systems are reviewed and negative.   Physical Exam: VS:  BP 128/66   Pulse 68   Ht 5\' 7"  (1.702 m)   Wt 235 lb (106.6 kg)   SpO2 92%   BMI 36.81 kg/m , BMI Body mass index is 36.81 kg/m.  Wt Readings from Last 3 Encounters:  02/10/23 235 lb (106.6 kg)  01/21/23 227 lb (103 kg)  01/16/23 231 lb 14.4 oz (105.2 kg)    General: Patient appears comfortable at rest. HEENT: Conjunctiva and lids normal, oropharynx clear with moist mucosa. Neck: Supple, no elevated JVP or carotid bruits, no thyromegaly. Lungs: Clear to auscultation, nonlabored breathing at rest. Cardiac: Regular rate and rhythm, no S3 or significant systolic murmur, no pericardial rub. Abdomen: Soft, nontender, no hepatomegaly, bowel sounds present, no guarding or rebound. Extremities:  No pitting edema, distal pulses 2+. Skin: Warm and dry. Musculoskeletal: No kyphosis. Neuropsychiatric: Alert and oriented x3, affect grossly appropriate.  Recent Labwork: 08/09/2022: ALT 25; AST 18 11/13/2022: Magnesium 2.3 11/15/2022: B Natriuretic Peptide 183.0; BUN 13; Creatinine, Ser 1.22; Potassium 3.5; Sodium 139 12/31/2022: Hemoglobin 11.4; Platelets 360     Component Value Date/Time   CHOL 208 (H) 05/15/2022 0444   TRIG 268 (H) 05/15/2022 0444   HDL 26 (L) 05/15/2022 0444   CHOLHDL 8.0 05/15/2022 0444   VLDL 54 (H) 05/15/2022 0444   LDLCALC 128 (H) 05/15/2022 0444    Other Studies Reviewed Today: Echocardiogram in 05/2022 LVEF normal  Assessment and Plan: Patient is a 72 year old M known to have HTN, DM 2, OSA not on CPAP (but on chronic oxygen therapy), COPD, chronic diastolic heart failure was referred to cardiology clinic for evaluation of SVT.  # History of SVT # Dizziness -I reviewed the EKGs on the EMR from 2024 that showed NSR with PACs and one EKG that showed brief episode of multifocal atrial tachycardia. He will benefit from 2-week event monitor, live to rule out any conduction system abnormality or arrhythmias that might be causing the dizziness.  # Chronic diastolic heart failure -Patient had multiple episodes of hospital missions for acute on chronic diastolic heart failure exacerbation likely secondary to chronic blood loss anemia resulting in chronic severe iron deficiency anemia. -Continue p.o. Lasix 40 mg once daily -He will benefit from SGLT- 2 inhibitors but he wants to think about it due to side effects  # OSA not on CPAP -Patient currently is on home oxygen at rest and with ambulation,4L. He has an upcoming appointment for OSA testing in May 2024. He might be agreeable to initiate CPAP therapy. Educated the importance of CPAP for OSA and the consequences of untreated OSA.  # HLD # Hypertriglyceridemia -Patient used atorvastatin in the past but did not  tolerated due to the side effect of nausea/stomach upset. Will start rosuvastatin 40 mg nightly.  # HTN, controlled # Diabetic nephropathy -Discontinue spironolactone start lisinopril 5 mg once daily -Decrease the dose of amlodipine from 5 mg to 2.5 mg once daily -Continue bisoprolol 5 mg once daily  # Chronic severe iron deficiency anemia -continue ferrous sulfate 325 mg three times daily, management per PCP.  # Screening of CAD -Obtain CT calcium scoring of coronaries   I have spent a total  of 45 minutes with patient reviewing chart, EKGs, labs and examining patient as well as establishing an assessment and plan that was discussed with the patient.  > 50% of time was spent in direct patient care.    Medication Adjustments/Labs and Tests Ordered: Current medicines are reviewed at length with the patient today.  Concerns regarding medicines are outlined above.   Tests Ordered: Orders Placed This Encounter  Procedures   CT CARDIAC SCORING (SELF PAY ONLY)   LONG TERM MONITOR-LIVE TELEMETRY (3-14 DAYS)    Medication Changes: Meds ordered this encounter  Medications   lisinopril (ZESTRIL) 5 MG tablet    Sig: Take 1 tablet (5 mg total) by mouth daily.    Dispense:  90 tablet    Refill:  3   rosuvastatin (CRESTOR) 40 MG tablet    Sig: Take 1 tablet (40 mg total) by mouth daily.    Dispense:  90 tablet    Refill:  3   amLODipine (NORVASC) 2.5 MG tablet    Sig: Take 1 tablet (2.5 mg total) by mouth daily.    Dispense:  90 tablet    Refill:  3    Disposition:  Follow up  3 months  Signed, Austynn Pridmore Verne Spurr, MD, 02/10/2023 12:15 PM    [facility] [address] [place], [place] [address]

## 2023-02-10 NOTE — Telephone Encounter (Signed)
Pt c/o medication issue:  1. Name of Medication: lisinopril (ZESTRIL) 5 MG tablet   2. How are you currently taking this medication (dosage and times per day)? N/A  3. Are you having a reaction (difficulty breathing--STAT)? No  4. What is your medication issue? Pharmacy is calling to report there was a severe reaction notification for this medication due to the patient actively being on spironolactone 25 mg tablets twice a day. Please advise.

## 2023-02-10 NOTE — Patient Instructions (Signed)
Medication Instructions:  Your physician has recommended you make the following change in your medication:   - Discontinue Aldactone -Start Crestor 40 mg once daily -Start Lisinopril 5 mg once daily -Decrease Amlodipine to 2.5 mg once daily  *If you need a refill on your cardiac medications before your next appointment, please call your pharmacy*   Lab Work: None If you have labs (blood work) drawn today and your tests are completely normal, you will receive your results only by: MyChart Message (if you have MyChart) OR A paper copy in the mail If you have any lab test that is abnormal or we need to change your treatment, we will call you to review the results.   Testing/Procedures: Zio Live Monitor Coronary Calcium CT   Follow-Up: At [facility], you and your health needs are our priority.  As part of our continuing mission to provide you with exceptional heart care, we have created designated Provider Care Teams.  These Care Teams include your primary Cardiologist (physician) and Advanced Practice Providers (APPs -  Physician Assistants and Nurse Practitioners) who all work together to provide you with the care you need, when you need it.  We recommend signing up for the patient portal called "MyChart".  Sign up information is provided on this After Visit Summary.  MyChart is used to connect with patients for Virtual Visits (Telemedicine).  Patients are able to view lab/test results, encounter notes, upcoming appointments, etc.  Non-urgent messages can be sent to your provider as well.   To learn more about what you can do with MyChart, go to [email or web address].    Your next appointment:   3 month(s)  Provider:   Luane School, MD    Other Instructions Christena Deem- Long Term Monitor Instructions   Your physician has requested you wear your ZIO patch monitor___14____days.   This is a single patch monitor.  [employer] supplies one patch monitor per enrollment.   Additional stickers are not available.   Please do not apply patch if you will be having a Nuclear Stress Test, Echocardiogram, Cardiac CT, MRI, or Chest Xray during the time frame you would be wearing the monitor. The patch cannot be worn during these tests.  You cannot remove and re-apply the ZIO XT patch monitor.   Your ZIO patch monitor will be sent USPS Priority mail from [facility] directly to your home address. The monitor may also be mailed to a PO BOX if home delivery is not available.   It may take 3-5 days to receive your monitor after you have been enrolled.   Once you have received you monitor, please review enclosed instructions.  Your monitor has already been registered assigning a specific monitor serial # to you.   Applying the monitor   Shave hair from upper left chest.   Hold abrader disc by orange tab.  Rub abrader in 40 strokes over left upper chest as indicated in your monitor instructions.   Clean area with 4 enclosed alcohol pads .  Use all pads to assure are is cleaned thoroughly.  Let dry.   Apply patch as indicated in monitor instructions.  Patch will be place under collarbone on left side of chest with arrow pointing upward.   Rub patch adhesive wings for 2 minutes.Remove white label marked "1".  Remove white label marked "2".  Rub patch adhesive wings for 2 additional minutes.   While looking in a mirror, press and release button in center of patch.  A small  green light will flash 3-4 times .  This will be your only indicator the monitor has been turned on.     Do not shower for the first 24 hours.  You may shower after the first 24 hours.   Press button if you feel a symptom. You will hear a small click.  Record Date, Time and Symptom in the Patient Log Book.   When you are ready to remove patch, follow instructions on last 2 pages of Patient Log Book.  Stick patch monitor onto last page of Patient Log Book.   Place Patient Log Book in [place] box.   Use locking tab on box and tape box closed securely.  The [employer] and [employer] has [employer] on it.  Please place in mailbox as soon as possible.  Your physician should have your test results approximately 7 days after the monitor has been mailed back to [facility].   Call [facility] at [phone] if you have questions regarding your ZIO XT patch monitor.  Call them immediately if you see an orange light blinking on your monitor.   If your monitor falls off in less than 4 days contact our Monitor department at [phone].  If your monitor becomes loose or falls off after 4 days call [employer] at [phone] for suggestions on securing your monitor.

## 2023-02-11 ENCOUNTER — Encounter (HOSPITAL_COMMUNITY): Payer: Medicare Other

## 2023-02-11 DIAGNOSIS — I10 Essential (primary) hypertension: Secondary | ICD-10-CM | POA: Diagnosis not present

## 2023-02-11 DIAGNOSIS — J449 Chronic obstructive pulmonary disease, unspecified: Secondary | ICD-10-CM | POA: Diagnosis not present

## 2023-02-11 DIAGNOSIS — R42 Dizziness and giddiness: Secondary | ICD-10-CM | POA: Diagnosis not present

## 2023-02-16 NOTE — Telephone Encounter (Signed)
Sent pt. My chart message that   Pulse oximetry, overnight  Got ordered closing encounter

## 2023-02-18 NOTE — Telephone Encounter (Signed)
Mark Cannon from [employer] called me back and left a vm stating ono machine is scheduled to go out on 5/9.  Will route message to triage so pt can be made aware thru MyChart.

## 2023-02-18 NOTE — Telephone Encounter (Signed)
Order for Mark Cannon was placed on 4/24.  Was sent to [employer] by Taryn on 4/25 and confirmation received from [facility] on 4/25.  I called Elease Hashimoto and have left her a vm to check on status of order.

## 2023-02-19 DIAGNOSIS — D649 Anemia, unspecified: Secondary | ICD-10-CM | POA: Diagnosis not present

## 2023-02-19 DIAGNOSIS — N289 Disorder of kidney and ureter, unspecified: Secondary | ICD-10-CM | POA: Diagnosis not present

## 2023-02-20 DIAGNOSIS — R0902 Hypoxemia: Secondary | ICD-10-CM | POA: Diagnosis not present

## 2023-02-20 DIAGNOSIS — G4734 Idiopathic sleep related nonobstructive alveolar hypoventilation: Secondary | ICD-10-CM | POA: Diagnosis not present

## 2023-02-20 DIAGNOSIS — G4730 Sleep apnea, unspecified: Secondary | ICD-10-CM | POA: Diagnosis not present

## 2023-02-21 DIAGNOSIS — C61 Malignant neoplasm of prostate: Secondary | ICD-10-CM | POA: Diagnosis not present

## 2023-02-26 ENCOUNTER — Telehealth: Payer: Self-pay | Admitting: Internal Medicine

## 2023-02-26 DIAGNOSIS — G4734 Idiopathic sleep related nonobstructive alveolar hypoventilation: Secondary | ICD-10-CM

## 2023-02-26 NOTE — Telephone Encounter (Signed)
ONO on 3lpm 02/20/23   < 4 min less than 89%  so no change 3lpm

## 2023-02-26 NOTE — Telephone Encounter (Signed)
Pt calling in for sleep results

## 2023-02-27 DIAGNOSIS — C61 Malignant neoplasm of prostate: Secondary | ICD-10-CM | POA: Diagnosis not present

## 2023-02-27 NOTE — Telephone Encounter (Signed)
Spoke with pt and notified of results per Dr. Sherene Sires. Pt verbalized understanding. He states he wants to have the test repeated on RA. Dr Sherene Sires- are you okay with this? He would like to get off of the o2. Thanks.    Please route back to [facility] triage pool

## 2023-02-27 NOTE — Telephone Encounter (Signed)
Fine with me

## 2023-02-27 NOTE — Telephone Encounter (Signed)
I spoke with the patient. He asked me to explain his ONO results. Once I told him his O2 sats did drop while on the 3L O2 but it did not stay below 89% long, he was ok not having the ONO without O2 done. He will continue to use the 3L O2 QHS.   Nothing further needed.

## 2023-02-28 ENCOUNTER — Other Ambulatory Visit: Payer: Self-pay | Admitting: *Deleted

## 2023-02-28 DIAGNOSIS — F17210 Nicotine dependence, cigarettes, uncomplicated: Secondary | ICD-10-CM

## 2023-02-28 DIAGNOSIS — Z122 Encounter for screening for malignant neoplasm of respiratory organs: Secondary | ICD-10-CM

## 2023-02-28 DIAGNOSIS — Z87891 Personal history of nicotine dependence: Secondary | ICD-10-CM

## 2023-03-13 DIAGNOSIS — I10 Essential (primary) hypertension: Secondary | ICD-10-CM | POA: Diagnosis not present

## 2023-03-13 DIAGNOSIS — N1832 Chronic kidney disease, stage 3b: Secondary | ICD-10-CM | POA: Diagnosis not present

## 2023-03-13 DIAGNOSIS — N189 Chronic kidney disease, unspecified: Secondary | ICD-10-CM | POA: Diagnosis not present

## 2023-03-13 DIAGNOSIS — E1122 Type 2 diabetes mellitus with diabetic chronic kidney disease: Secondary | ICD-10-CM | POA: Diagnosis not present

## 2023-03-17 ENCOUNTER — Encounter: Payer: Self-pay | Admitting: Internal Medicine

## 2023-03-20 ENCOUNTER — Ambulatory Visit: Payer: MEDICARE

## 2023-03-24 ENCOUNTER — Telehealth: Payer: MEDICARE

## 2023-03-25 ENCOUNTER — Telehealth: Payer: BLUE CROSS/BLUE SHIELD

## 2023-03-25 NOTE — Telephone Encounter
Spoke with pt assistant Zella Ball [phone])    She stated that pt will be going to [place] instead and will reach back out if necessary

## 2023-03-25 NOTE — Telephone Encounter
Spoke with pt assistant Robin ([phone])    She stated that pt will be going to [place] instead and will reach back out if necessary

## 2023-03-25 NOTE — Telephone Encounter
Appointment Accommodation Request      Appointment Type: return    Reason for sooner request: patients assistant robin states that pt has finished his course of steroids and antibiotics, he is getting over his sinus infection but is still not feeling well. He is requesting to see Dr Evelena Leyden this week. He will go to urgent care if Dr Evelena Leyden cannot see him this week.    Date/Time Requested (If any): see above    Last seen by MD: 3/14    Any Symptoms:  []  Yes  [x]  No      If yes, what symptoms are you experiencing:   Duration of symptoms (how long):     Patient or caller was offered an appointment but declined.    Patient or caller was advised to seek emergency services if conditions are urgent or emergent.    Patient or caller has been notified of the turnaround time of 1-2 business (days).

## 2023-04-09 ENCOUNTER — Ambulatory Visit (HOSPITAL_COMMUNITY)
Admission: RE | Admit: 2023-04-09 | Discharge: 2023-04-09 | Disposition: A | Discharge status: Home / Self Care | Payer: Medicare Other | Source: Ambulatory Visit | Attending: Internal Medicine | Admitting: Internal Medicine

## 2023-04-09 DIAGNOSIS — Z136 Encounter for screening for cardiovascular disorders: Secondary | ICD-10-CM | POA: Insufficient documentation

## 2023-04-15 MED ORDER — FLUTICASONE-SALMETEROL 500-50 MCG/ACT IN AEPB
11 refills
Start: 2023-04-15 — End: ?

## 2023-04-18 MED ORDER — FLUTICASONE-SALMETEROL 500-50 MCG/ACT IN AEPB
11 refills | Status: AC
Start: 2023-04-18 — End: ?

## 2023-05-02 ENCOUNTER — Encounter: Payer: Self-pay | Admitting: Internal Medicine

## 2023-05-02 ENCOUNTER — Ambulatory Visit: Payer: Medicare Other | Attending: Internal Medicine | Admitting: Internal Medicine

## 2023-05-02 VITALS — Ht 67.0 in | Wt 232.6 lb

## 2023-05-02 DIAGNOSIS — R931 Abnormal findings on diagnostic imaging of heart and coronary circulation: Secondary | ICD-10-CM | POA: Insufficient documentation

## 2023-05-02 MED ORDER — FUROSEMIDE 40 MG PO TABS: 40.0000 mg | ORAL_TABLET | Freq: Every day | ORAL | 1 refills | Status: DC | PRN

## 2023-05-02 NOTE — Patient Instructions (Addendum)
Medication Instructions:  Your physician has recommended you make the following change in your medication:  Stop bisoprolol Stop amlodipine Stop lisinopril Change furosemide 40 mg to daily as needed for swelling or shortness of breath Continue all other medications as prescribed  Labwork: none  Testing/Procedures: none  Follow-Up: Your physician recommends that you schedule a follow-up appointment in: 6 months  Any Other Special Instructions Will Be Listed Below (If Applicable). Your physician has requested that you regularly monitor and record your blood pressure readings at home. Please use the same machine at the same time of day to check your readings and record them. Please contact our office if the top number of your blood pressure stays consistently above 130.   If you need a refill on your cardiac medications before your next appointment, please call your pharmacy.

## 2023-05-02 NOTE — Progress Notes (Signed)
Cardiology Office Note  Date: 05/02/2023   ID: Mark Cannon, Mark Cannon 1951/01/29, MRN [id number]  PCP:  Avis Epley, PA-C  Cardiologist:  Marjo Bicker, MD Electrophysiologist:  None    History of Present Illness: Mark Cannon is a 72 y.o. male known to have HTN, DM 2, OSA not on CPAP but on chronic oxygen therapy, COPD, chronic diastolic heart failure  is here for follow-up visit.  I reviewed the EKGs from 2024 that showed NSR with PACs and another EKG that showed brief multifocal atrial tachycardia. Due to persistent dizziness, event monitor was performed in 6/24 that showed 4 runs of SVT, longest lasting 2.6 sec and fastest lasting 2.3 sec, no conduction system abnormalities to explain dizziness.  He is here for follow-up visit.  Patient is here for follow-up visit with me. No interval ER visits or hospitalizations. Patient denied any rest or exertional chest discomfort, tightness, heaviness or pressure, rest or exertional dyspnea, palpitations, syncope and LE swelling. Reports ongoing dizziness x 6-8 months, 4-5 times/week and occurs sometimes with standing. Orthostatics are negative today. Compliant with medications. No bleeding complications.  Past Medical History:  Diagnosis Date   Arthritis    Cancer [facility])    Prostate, has had prostate removed, has had radiation and still has cancer,   Chronic respiratory failure (HCC)    Colon polyps    Cough    Diabetes mellitus without complication (HCC)    Dyspnea    GERD (gastroesophageal reflux disease)    occasional    H/O tooth extraction week of 03/15/2015    History of kidney stones    Hypercholesteremia    Hypertension    Lumbar stenosis with neurogenic claudication    Pneumonia    Pre-diabetes    Renal disorder    Sleep apnea    No cpap    Past Surgical History:  Procedure Laterality Date   APPENDECTOMY     BACK SURGERY     BIOPSY  12/03/2022   Procedure: BIOPSY;  Surgeon: Dolores Frame, MD;   [facility];  Service: Gastroenterology;;   CHOLECYSTECTOMY     COLONOSCOPY  02/04/2012   Procedure: COLONOSCOPY;  Surgeon: Dalia Heading, MD;  [facility];  Service: Gastroenterology;  Laterality: N/A;   COLONOSCOPY N/A 09/22/2018   Procedure: COLONOSCOPY;  Surgeon: Franky Macho, MD;  [facility];  Service: Gastroenterology;  Laterality: N/A;   COLONOSCOPY W/ POLYPECTOMY     COLONOSCOPY WITH PROPOFOL N/A 10/15/2022   Procedure: COLONOSCOPY WITH PROPOFOL;  Surgeon: Lanelle Bal, DO;  [facility];  Service: Endoscopy;  Laterality: N/A;   COLONOSCOPY WITH PROPOFOL N/A 12/03/2022   Procedure: COLONOSCOPY WITH PROPOFOL;  Surgeon: Dolores Frame, MD;  [facility];  Service: Gastroenterology;  Laterality: N/A;  12:00pm, asa 3   ENTEROSCOPY N/A 12/03/2022   Procedure: ENTEROSCOPY;  Surgeon: Dolores Frame, MD;  [facility];  Service: Gastroenterology;  Laterality: N/A;  push enteroscopy   ESOPHAGOGASTRODUODENOSCOPY (EGD) WITH PROPOFOL N/A 10/15/2022   Procedure: ESOPHAGOGASTRODUODENOSCOPY (EGD) WITH PROPOFOL;  Surgeon: Lanelle Bal, DO;  [facility];  Service: Endoscopy;  Laterality: N/A;   GIVENS CAPSULE STUDY N/A 11/14/2022   Procedure: GIVENS CAPSULE STUDY;  Surgeon: Corbin Ade, MD;  [facility];  Service: Endoscopy;  Laterality: N/A;   HEMOSTASIS CLIP PLACEMENT  12/03/2022   Procedure: HEMOSTASIS CLIP PLACEMENT;  Surgeon: Dolores Frame, MD;  [facility];  Service: Gastroenterology;;   HERNIA REPAIR     Bilateral inguinal hernia repair X 4   HOT HEMOSTASIS  12/03/2022   Procedure: HOT HEMOSTASIS (ARGON PLASMA COAGULATION/BICAP);  Surgeon: Marguerita Merles, Reuel Boom, MD;  [facility];  Service: Gastroenterology;;   hydrocelectomy     left knee arthroscopy     LITHOTRIPSY     LYMPHADENECTOMY Bilateral 03/22/2015   Procedure: PELVIC  LYMPHADENECTOMY;  Surgeon: Sebastian Ache, MD;  Location: [facility];  Service: Urology;  Laterality: Bilateral;   NM MYOVIEW LTD  2011   POLYPECTOMY  09/22/2018   Procedure: POLYPECTOMY;  Surgeon: Franky Macho, MD;  [facility];  Service: Gastroenterology;;   POLYPECTOMY  10/15/2022   Procedure: POLYPECTOMY;  Surgeon: Lanelle Bal, DO;  [facility];  Service: Endoscopy;;   POLYPECTOMY  12/03/2022   Procedure: POLYPECTOMY;  Surgeon: Dolores Frame, MD;  [facility];  Service: Gastroenterology;;   ROBOT ASSISTED LAPAROSCOPIC RADICAL PROSTATECTOMY N/A 03/22/2015   Procedure: ROBOTIC ASSISTED LAPAROSCOPIC RADICAL PROSTATECTOMY WITH INDOCYANINE GREEN DYE INJECTION;  Surgeon: Sebastian Ache, MD;  Location: [facility];  Service: Urology;  Laterality: N/A;   ROBOTIC ASSISTED LAPAROSCOPIC LYSIS OF ADHESION  03/22/2015   Procedure: ROBOTIC ASSISTED LAPAROSCOPIC LYSIS OF ADHESION;  Surgeon: Sebastian Ache, MD;  Location: [facility];  Service: Urology;;   SHOULDER SURGERY Right    SUBMUCOSAL LIFTING INJECTION  12/03/2022   Procedure: SUBMUCOSAL LIFTING INJECTION;  Surgeon: Marguerita Merles, Reuel Boom, MD;  [facility];  Service: Gastroenterology;;   VASECTOMY      Current Outpatient Medications  Medication Sig Dispense Refill   amLODipine (NORVASC) 2.5 MG tablet Take 1 tablet (2.5 mg total) by mouth daily. 90 tablet 3   bisoprolol (ZEBETA) 5 MG tablet Take 5 mg by mouth daily.     docusate sodium (COLACE) 100 MG capsule Take 100 mg by mouth daily.     ferrous sulfate 325 (65 FE) MG tablet Take 1 tablet (325 mg total) by mouth 3 (three) times daily with meals. 90 tablet 2   Fluticasone-Umeclidin-Vilant (TRELEGY ELLIPTA) 200-62.5-25 MCG/ACT AEPB Inhale 1 puff into the lungs daily. 14 each 0   furosemide (LASIX) 40 MG tablet Take 1 tablet (40 mg total) by mouth daily. 30 tablet 1   glimepiride (AMARYL) 1 MG tablet Take 1 mg by mouth 2 (two) times daily.      HYDROcodone-acetaminophen (NORCO) 10-325 MG tablet Take 1 tablet by mouth 4 (four) times daily as needed for moderate pain.     lisinopril (ZESTRIL) 5 MG tablet Take 1 tablet (5 mg total) by mouth daily. 90 tablet 3   OXYGEN Inhale 3 L into the lungs continuous.     pantoprazole (PROTONIX) 40 MG tablet Take 1 tablet (40 mg total) by mouth daily. 30 tablet 2   rosuvastatin (CRESTOR) 40 MG tablet Take 1 tablet (40 mg total) by mouth daily. 90 tablet 3   umeclidinium-vilanterol (ANORO ELLIPTA) 62.5-25 MCG/ACT AEPB Inhale 1 puff into the lungs daily. 1 each 11   No current facility-administered medications for this visit.   Allergies:  Lidocaine, Benzocaine, Benzocaine, and Other   Social History: The patient  reports that he has quit smoking. His smoking use included cigarettes. He has a 55 pack-year smoking history. He has been exposed to tobacco smoke. He has never used smokeless tobacco. He reports that he does not currently use alcohol after a past usage of about 1.0 standard drink  of alcohol per week. He reports that he does not use drugs.   Family History: The patient's family history includes Heart failure in his father; Stroke in his father.   ROS:  Please see the history of present illness. Otherwise, complete review of systems is positive for none.  All other systems are reviewed and negative.   Physical Exam: VS:  Ht 5\' 7"  (1.702 m)   BMI 36.81 kg/m , BMI Body mass index is 36.81 kg/m.  Wt Readings from Last 3 Encounters:  02/10/23 235 lb (106.6 kg)  01/21/23 227 lb (103 kg)  01/16/23 231 lb 14.4 oz (105.2 kg)    General: Patient appears comfortable at rest. HEENT: Conjunctiva and lids normal, oropharynx clear with moist mucosa. Neck: Supple, no elevated JVP or carotid bruits, no thyromegaly. Lungs: Clear to auscultation, nonlabored breathing at rest. Cardiac: Regular rate and rhythm, no S3 or significant systolic murmur, no pericardial rub. Abdomen: Soft, nontender, no  hepatomegaly, bowel sounds present, no guarding or rebound. Extremities: No pitting edema, distal pulses 2+. Skin: Warm and dry. Musculoskeletal: No kyphosis. Neuropsychiatric: Alert and oriented x3, affect grossly appropriate.  Recent Labwork: 08/09/2022: ALT 25; AST 18 11/13/2022: Magnesium 2.3 11/15/2022: B Natriuretic Peptide 183.0; BUN 13; Creatinine, Ser 1.22; Potassium 3.5; Sodium 139 12/31/2022: Hemoglobin 11.4; Platelets 360     Component Value Date/Time   CHOL 208 (H) 05/15/2022 0444   TRIG 268 (H) 05/15/2022 0444   HDL 26 (L) 05/15/2022 0444   CHOLHDL 8.0 05/15/2022 0444   VLDL 54 (H) 05/15/2022 0444   LDLCALC 128 (H) 05/15/2022 0444    Other Studies Reviewed Today: Echocardiogram in 05/2022 LVEF normal  Assessment and Plan: Patient is a 72 year old M known to have HTN, DM 2, OSA not on CPAP (but on chronic oxygen therapy), COPD, chronic diastolic heart failure was referred to cardiology clinic for evaluation of SVT.  # History of SVT # Dizziness # HTN, controlled -I reviewed the EKGs on the EMR from 2024 that showed NSR with PACs and one EKG that showed brief episode of multifocal atrial tachycardia. Event monitor was performed in 6/24 that showed 4 runs of SVT, longest lasting 2.6 sec and fastest lasting 2.3 sec, no conduction system abnormalities to explain dizziness. Orthostatic vitals were negative for orthostatic hypotension and POTS but baseline BP has been low. -Due to ongoing dizziness with walking, patient is recommended to go to ER. He refused. Hold lasix, bisoprolol, amlodipine and lisinopril. He is instructed to call clinic if his BP>130 mm hg SBP which is when I will restart Lisinopril 2.5 mg once daily.  # Chronic diastolic heart failure -Patient had multiple episodes of hospital admissions for acute on chronic diastolic heart failure exacerbation likely secondary to chronic blood loss anemia resulting in chronic severe iron deficiency anemia. -Hold p.o lasix  and take PRN sob/leg swelling  # Elevated coronary calcium score 297 in 06/24 -Continue high intensity statin, rosuvastatin 40 mg at bedtime. No angina or DOE.  # OSA not on CPAP -Patient currently is on home oxygen at rest and with ambulation,4L.  # HLD # Hypertriglyceridemia -Patient used atorvastatin in the past but did not tolerated due to the side effect of nausea/stomach upset. Switched to rosuavstatin 40 mg at bedtime.  # Chronic severe iron deficiency anemia -On iron supplements, management per pcp.   I have spent a total of 30 minutes with patient reviewing chart, EKGs, labs and examining patient as well as establishing an assessment and plan that was  discussed with the patient.  > 50% of time was spent in direct patient care.    Medication Adjustments/Labs and Tests Ordered: Current medicines are reviewed at length with the patient today.  Concerns regarding medicines are outlined above.   Tests Ordered: No orders of the defined types were placed in this encounter.   Medication Changes: No orders of the defined types were placed in this encounter.   Disposition:  Follow up  6 months  Signed, Kadin Bera Verne Spurr, MD, 05/02/2023 11:39 AM    [facility] [address] [place], [place] [address]

## 2023-05-05 ENCOUNTER — Telehealth: Payer: Self-pay | Admitting: Internal Medicine

## 2023-05-05 MED ORDER — LISINOPRIL 5 MG PO TABS: 2.5000 mg | ORAL_TABLET | Freq: Every day | ORAL | Status: DC

## 2023-05-05 NOTE — Telephone Encounter (Signed)
HTN, controlled -I reviewed the EKGs on the EMR from 2024 that showed NSR with PACs and one EKG that showed brief episode of multifocal atrial tachycardia. Event monitor was performed in 6/24 that showed 4 runs of SVT, longest lasting 2.6 sec and fastest lasting 2.3 sec, no conduction system abnormalities to explain dizziness. Orthostatic vitals were negative for orthostatic hypotension and POTS but baseline BP has been low. -Due to ongoing dizziness with walking, patient is recommended to go to ER. He refused. Hold lasix, bisoprolol, amlodipine and lisinopril. He is instructed to call clinic if his BP>130 mm hg SBP which is when I will restart Lisinopril 2.5 mg once daily.        Patient will take Lisinopril 2.5 mg daily and report back in 1 week with BP readings.

## 2023-05-05 NOTE — Telephone Encounter (Signed)
Pt c/o BP issue: STAT if pt c/o blurred vision, one-sided weakness or slurred speech  1. What are your last 5 BP readings?  149/86 144/77 62  2. Are you having any other symptoms (ex. Dizziness, headache, blurred vision, passed out)? Headache   3. What is your BP issue?   Patient states he was advised to stop his BP medication and his BP has increased over the weekend.

## 2023-05-07 DIAGNOSIS — E1122 Type 2 diabetes mellitus with diabetic chronic kidney disease: Secondary | ICD-10-CM | POA: Diagnosis not present

## 2023-05-07 DIAGNOSIS — N1832 Chronic kidney disease, stage 3b: Secondary | ICD-10-CM | POA: Diagnosis not present

## 2023-05-07 DIAGNOSIS — I10 Essential (primary) hypertension: Secondary | ICD-10-CM | POA: Diagnosis not present

## 2023-05-07 DIAGNOSIS — N189 Chronic kidney disease, unspecified: Secondary | ICD-10-CM | POA: Diagnosis not present

## 2023-05-09 ENCOUNTER — Telehealth: Payer: Self-pay | Admitting: Internal Medicine

## 2023-05-09 NOTE — Telephone Encounter (Signed)
Pt c/o medication issue:  1. Name of Medication: Bisoprolol, Amlodipine, Lisinopril and Furosemide  2. How are you currently taking this medication (dosage and times per day)?   3. Are you having a reaction (difficulty breathing--STAT)?   4. What is your medication issue? Dr Jenene Slicker stopped all of these medicine  last Friday(05-02-23. He said he felt so bad, when he stopped these medicine. He said started taking all of these medicine again yesterday(05-08-23). This morning his blood pressure was 121/76.he said he will continue to take all four of the medicine, unless Dr Jenene Slicker tells him to do something different.

## 2023-05-09 NOTE — Telephone Encounter (Signed)
Returned pt call. No answer. Left msg to call back.  

## 2023-05-12 NOTE — Telephone Encounter (Signed)
Spoke with pt who states that he started back all medicines that Dr. Jenene Slicker took him off of. Pt reports that he was unable to sleep at night, headache and was restless. Pt states that he just didn't feel right. Pt reports that while off of meds his BP was 150/84, 135/86,136/80. He has restarted meds and current BP is 130/64 55, 123/77 54. Please advise.

## 2023-05-15 DIAGNOSIS — N2581 Secondary hyperparathyroidism of renal origin: Secondary | ICD-10-CM | POA: Insufficient documentation

## 2023-05-15 DIAGNOSIS — I10 Essential (primary) hypertension: Secondary | ICD-10-CM | POA: Diagnosis not present

## 2023-05-15 DIAGNOSIS — N1831 Chronic kidney disease, stage 3a: Secondary | ICD-10-CM | POA: Insufficient documentation

## 2023-05-15 DIAGNOSIS — D631 Anemia in chronic kidney disease: Secondary | ICD-10-CM | POA: Diagnosis not present

## 2023-05-15 DIAGNOSIS — N189 Chronic kidney disease, unspecified: Secondary | ICD-10-CM | POA: Diagnosis not present

## 2023-05-15 DIAGNOSIS — E1122 Type 2 diabetes mellitus with diabetic chronic kidney disease: Secondary | ICD-10-CM | POA: Diagnosis not present

## 2023-05-16 NOTE — Telephone Encounter (Signed)
Left a message for patient to call office back regarding medications.

## 2023-05-19 ENCOUNTER — Encounter: Payer: Self-pay | Admitting: *Deleted

## 2023-05-19 NOTE — Telephone Encounter (Signed)
Patient was returning call. Please advise ?

## 2023-05-19 NOTE — Telephone Encounter (Signed)
Left a detailed message on pt's answering machine (okay per DPR). Pt advised to call office back if he has any further questions.

## 2023-05-19 NOTE — Telephone Encounter (Signed)
Replied via mychart.

## 2023-05-22 DIAGNOSIS — D649 Anemia, unspecified: Secondary | ICD-10-CM | POA: Diagnosis not present

## 2023-05-22 DIAGNOSIS — Z0001 Encounter for general adult medical examination with abnormal findings: Secondary | ICD-10-CM | POA: Diagnosis not present

## 2023-05-22 DIAGNOSIS — Z1331 Encounter for screening for depression: Secondary | ICD-10-CM | POA: Diagnosis not present

## 2023-05-22 DIAGNOSIS — M170 Bilateral primary osteoarthritis of knee: Secondary | ICD-10-CM | POA: Diagnosis not present

## 2023-05-22 DIAGNOSIS — D500 Iron deficiency anemia secondary to blood loss (chronic): Secondary | ICD-10-CM | POA: Diagnosis not present

## 2023-05-22 DIAGNOSIS — E1159 Type 2 diabetes mellitus with other circulatory complications: Secondary | ICD-10-CM | POA: Diagnosis not present

## 2023-05-22 DIAGNOSIS — M5136 Other intervertebral disc degeneration, lumbar region: Secondary | ICD-10-CM | POA: Diagnosis not present

## 2023-05-22 DIAGNOSIS — Z6836 Body mass index (BMI) 36.0-36.9, adult: Secondary | ICD-10-CM | POA: Diagnosis not present

## 2023-05-22 DIAGNOSIS — J9611 Chronic respiratory failure with hypoxia: Secondary | ICD-10-CM | POA: Diagnosis not present

## 2023-05-22 DIAGNOSIS — N289 Disorder of kidney and ureter, unspecified: Secondary | ICD-10-CM | POA: Diagnosis not present

## 2023-05-22 DIAGNOSIS — E6609 Other obesity due to excess calories: Secondary | ICD-10-CM | POA: Diagnosis not present

## 2023-05-22 DIAGNOSIS — I471 Supraventricular tachycardia, unspecified: Secondary | ICD-10-CM | POA: Diagnosis not present

## 2023-06-03 DIAGNOSIS — Z0001 Encounter for general adult medical examination with abnormal findings: Secondary | ICD-10-CM | POA: Diagnosis not present

## 2023-06-03 DIAGNOSIS — E039 Hypothyroidism, unspecified: Secondary | ICD-10-CM | POA: Diagnosis not present

## 2023-06-03 DIAGNOSIS — E7849 Other hyperlipidemia: Secondary | ICD-10-CM | POA: Diagnosis not present

## 2023-06-03 DIAGNOSIS — E1159 Type 2 diabetes mellitus with other circulatory complications: Secondary | ICD-10-CM | POA: Diagnosis not present

## 2023-06-03 DIAGNOSIS — E559 Vitamin D deficiency, unspecified: Secondary | ICD-10-CM | POA: Diagnosis not present

## 2023-06-05 ENCOUNTER — Telehealth: Payer: BLUE CROSS/BLUE SHIELD

## 2023-06-05 ENCOUNTER — Inpatient Hospital Stay: Payer: BLUE CROSS/BLUE SHIELD

## 2023-06-05 ENCOUNTER — Ambulatory Visit: Payer: BLUE CROSS/BLUE SHIELD

## 2023-06-05 DIAGNOSIS — J4489 Chronic asthmatic bronchitis (HCC/RAF): Secondary | ICD-10-CM

## 2023-06-05 DIAGNOSIS — G4733 Obstructive sleep apnea (adult) (pediatric): Secondary | ICD-10-CM

## 2023-06-05 DIAGNOSIS — J4540 Moderate persistent asthma, uncomplicated: Secondary | ICD-10-CM

## 2023-06-05 MED ORDER — PROAIR HFA 108 (90 BASE) MCG/ACT IN AERS
2 | RESPIRATORY_TRACT | 1 refills | Status: AC | PRN
Start: 2023-06-05 — End: ?

## 2023-06-05 NOTE — Progress Notes
PULMONARY/SLEEP MEDICINE NOTE    PCP: Babs Sciara, MD      CC: OSA and asthma fu     DIAGNOSTIC SLEEP TEST:  In 2006    CARDIODIAGNOSTIC EXAM(s):  N/A      INTERVAL HISTORY:  Maurice Ramirez is a 72 y.o. male with  moderate persistent asthma, intermittent episodes of allergic bronchopulmonary aspergillosis , h/o LLL nodule (bx in 2012 benign), OSA on PAP, and h/o afib s/p cardioversion for fu OSA     06/05/23  - Just came back from 12 day vacation in [place] and did not take CPAP with him and took oral appliance instead and was able to minimize snoring. Oral appliance has not been changed in 15 years. CPAP is dreamstation. 30 day AHI 2.1, Limited data due to not using during recent travels. On average, 4 days a week wakes up without it.   - sleeps through the night without issues  - has not received replacement device from [facility] or new device   - ESS 7. (3 for lying down to rest. Enters meditative state when laying down for energy preservation.)  - Asthma: well-controlled. Uses advair regularly, albuterol about 1-2x/week.    Interim history:  Has not had a drink for 6 years. Using PAP nightly and 1/4 of time wakes up and mask is off his face.   Had no episode of asthma exacerbation since last visit. Has not gone to hospital for >3 years.   Exercising daily, walks 45 min a day, [employer] daily    LCV: 7/23: Doing well. 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 Does the [employer], bands, dumbbells - 1.5-2 hours work out then walks and [employer]. Gets enough sleep. Stopped alcohol.    He still reports using his device every night, still wakes up with the mask off and does not recall why, but this is not every night, and on the whole, he denies fatigue, daytime sleepiness, snoring and morning headaches. Uses MAD when he travels.  No asthma exacerbations during this time. Denies shortness of breath, wheezing, chest tightness and dizziness. Compliant on his inhalers. Able to run/jog every few days. Does Bruce yearly and does better every year.    11/01/21: Patient returns after 2 years away from clinic. He has been doing quite well. He still reports using his device every night, still wakes up with the mask off and does not recall why, but this is not every night, and on the whole, he denies fatigue, daytime sleepiness, snoring and morning headaches. Uses MAD when he travels. He is exercising daily (brisk walk/run outside). He increases his exercise tolerance every year. No asthma exacerbations during this time. Denies shortness of breath, wheezing, chest tightness and dizziness. Compliant on his inhalers.     12/26/22  Was supposed to have pfts today but had a recent oral implant and was told that needs to wait 3 months.  Brought in refurbished dreamstation that he received but did not bring cord so I cannot download data. Filter dirty and we reviewed that he needs to change the blue filter.   Last device distributed 5 years ago.  Got labs done today.  Overall doing well.     Asthma meds and hx:  Singulair 10mg  daily, advair 500/50 BID and albuterol (1x/week)  Last hospital visit for asthma was many years ago, intubated about >16yrs ago for a severe attack  Last use of prednisone dose for asthma (prior  to sinus infections) was several years ago    Current bedtime 10 PM, falls asleep within few min  with PAP, wakes up at 5 am  Patient is tolerating PAP therapy: Yes  On PAP therapy, patient reports improvement in: sleep quality, daytime energy, alertness, and No improvement  Abdominal bloating, nausea or eructation associated with PAP therapy: No  Snoring with PAP: No  Sleepiness with driving or motor vehicle collisions due to drowsy driving: No  The patient is using a standard nasal interface.  Patient is routinely getting replacement supplies: Yes     The patient is willing to continue with PAP therapy     PMH: asthma    Social History     Tobacco Use    Smoking status: Never    Smokeless tobacco: Never       No family history on file.    Allergies   Allergen Reactions    Chocolate     Pollen Extract     Wheat        Medications that the patient states to be currently taking   Medication Sig    EPINEPHrine 0.3 mg/0.3 mL auto-injector Inject 0.3 mLs (0.3 mg total) into the muscle as needed for for Anaphylaxis.    fluticasone-salmeterol 500-50 mcg/act diskus inhaler USE 1 INHALATION TWICE A DAY    MONTELUKAST 10 mg tablet TAKE 1 TABLET EVERY EVENING    rosuvastatin 10 mg tablet Take 1 tablet (10 mg total) by mouth every morning.    [DISCONTINUED] PROAIR HFA 108 (90 Base) MCG/ACT inhaler Inhale 2 puffs every four (4) hours as needed for Wheezing or Shortness of Breath.       ROS: pertinent review of systems as detailed in the HPI. The rest of the complete review of systems is negative except as detailed in the written health and history questionnaire.      PHYSICAL EXAM:  BP 110/64  ~ Pulse 65  ~ Temp 36.2 ?C (97.2 ?F) (Tympanic)  ~ Resp 16  ~ Ht 5' 9'' (1.753 m)  ~ Wt 159 lb (72.1 kg)  ~ SpO2 97%  ~ BMI 23.48 kg/m?   General: Well-developed male, in no acute distress.   HEENT: Normocephalic and atraumatic. Pupils are equal, round,  Mallampati class III airway. No exudates or lesions.   Neck: Supple.   Extremities: none lower extremity edema. No clubbing or cyanosis.  Neuro: Alert, oriented and appropriately interactive. No focal deficits. Speech fluent.   Psychological: normal affect    LABS: l     .labres    COMPLIANCE DATA:  Reviewed device. Limited data due to recent travel  AHI 2.1  Therapy hours 2h/ night    Labs  IgE  2119 (1666 07/2018 <-1422)  CBC with eos normal    Imaging:  CT chest 2012  Unchanged biapical fibronodular pleural parenchymal scarring and calcified subcentimeter nodule within the right middle lobe (2-51), consistent with prior granulomatous disease.   Persistent diffuse peribronchial and peribronchiolar thickening with associated airway irregularity and occasional ectasia, consistent with chronic asthma/bronchitis, with scattered bronchocentric bands and architectural distortion within both upper, right middle and left   greater than right lower lobes, consistent with postinflammatory pleural parenchymal scarring.  Near-complete resolution of mass-like consolidation within the left lower lobe with residual architectural distortion (2-56). Several fluctuating subcentimeter nodules within the left lower lobe and resolved within the right lower lobe since 05/28/2011, likely airway-related inflammatory and mucus impaction.     Pathology:  LLL nodule  05/2011  LUNG NODULE, LEFT LOWER LOBE (BIOPSY):  - Acute and organizing pneumonia (see Comment)  - Prominent chronic inflammation in interstitium  - Gram stain is negative for bacterial organisms  - GMS stain is negative for fungal organisms  - AFB stain is negative for acid-fast bacilli  - No neoplasms identified     PFT 12/2022  FVC  (preBD) 3.62/96.9% (postBD) 4.05/110%   FEV1 (preBD 1.47/53.2% (postBD) 1.65/60%  Has positive BD response     PFT 12/2019  FVC  (preBD) 3.60/90% (postBD) 4.26/108%   FEV1 (preBD 1.22/40% (postBD) 1.60/52%  Has positive BD response         PFT 07/2018  FVC 3.95 (94.8%),  FEV1  1.51 (48.7%)  DLCO 28.4 (100%)     PFT 09/2014:  FVC 4.04 (93.7%), post 4.436 (103.6%)  FEV1  1.29 (39.8%), post 1.67 (51.6%)  DLCO 100.7%     PFT 08/2010  FVC 4.80 (109%), post 4.99 (114%)  FEV 1.85 (55%), post 2.01 (60%)        ASSESSMENT:  72 y.o. man with  moderate persistent asthma (FEV1 40.1%), intermittent episodes of allergic bronchopulmonary aspergillosis , h/o LLL nodule (bx in 2012 benign) and OSA on PAP presents for follow up. Has been doing well with no issues. No compliance data recently, patient is due for a new device, so can assess data from new device.       Obstructive Sleep Apnea:  Has refurbished DS (serial number [id number]), which needs to be registered with system.   Continue with PAP therapy, the patient has acclimated well and is experiencing symptomatic improvement.  The patient is willing to continue with PAP therapy.  F/u on rx placed for new resmed airsense 11  Dental referral for new MAD        Asthma  Well controlled. Patient feeling well overall. IgE elevated but eos normal. PFTs stable. History of intermittent episodes of ABPA.   - repeat CBC and IgE. Consider referral to allergy if IgE remains high      I discussed the plan in detail with the patient who voiced understanding and is in agreement.    >~ 25 minutes was spent on this visit.    Follow up: ~6 months, sooner if needed.    DWA Dr. Franky Macho Maurice Ramirez

## 2023-06-05 NOTE — Patient Instructions
PAP Cleaning Equipment Instructions    Mask:  Clean once a week at a minimum with mild soap and water and let air dry  Replace cushion every 4-6 weeks    Humidifier Chamber:  Clean once a week at a minimum with mild soap and water and let air dry  Use distilled water only while using during sleep    Tubing:  Clean once a week at a minimum with mild soap and water  Hang dry    Filters:    1. DreamStation Respironics:  Dark Blue Non Disposable Filter:   Rinse at least once a week with soap/water  Replace every 3-6 months  Disposable Filter (light blue or purple):   Replace every 4-6 weeks   2. Remstar Respironics:  Paulette Blanch:  Rinse at least once a week with soap/water  Replace every 3-6 months  Disposable Filter (white):   Replace every 4-6 weeks   3. ResMed Airsense 10 Auto Set and S9 Auto Set:  Change every 4-6 weeks    General Recommendations   Use warm soapy water to clean your equipment  Ivory liquid soap or [employer]  Once a month soak mask and water chamber for 30 minutes with 1 part vinegar and 2 parts water     Dentists with expertise in Oral Appliances for OSA  *Patients are encouraged to call the Dentist?s office to ask about insurance coverage and cost*    NAME ADDRESS CONTACT INSURANCE   Tilda Burrow, DMD, MPH [facility]  A Floor  [facility]  [address]  [place], [place] [address]   Phone: [phone]  Fax: [phone]   Insurance: -    Nelta Numbers, DDS [address]  [place], [place] [address] Phone: [phone]  Fax: [phone]   Insurance: All PPO (expect to be out of network)   Quenton Fetter, DDS [address]  [place], [place] [address] Phone:[phone]  [phone] Insurance: All PPO (expect to be out of network): No MediCare   Duard Larsen, DDS, MD [facility]  [address], [address]  [place], [place] [address] [email or web address]  Phone: [phone]  Fax: [phone] Insurance: PPO in network, No Medicare         [place] [facility]Sleep [facility] Dentists in the [employer] [place]    NAME ADDRESS CONTACT   Currie Paris, DDS [address]   [address]  [place], [place] [address]   office [phone]  [email or web address]   Kelvin Cellar, DDS [address] [place],  [address] [place], [address] [phone]   Percell Boston [address] [place],  [address] [place], [address] [phone]   Dimas Alexandria [address] [place],  [address] [place], [address] [phone]   Atilano Ina [facility]  [address]  [place], [place] [address] [phone]   Myles Gip [place], [place] [phone]   Sabino Gasser [place], [place] [phone] [place], [place] [phone] [place], [place] [phone]   Rich Herschinger [address]  [place] [address] [phone]   Laverta Baltimore, DDS, MPH [address] [facility]. [address] [place], [place] [address] [phone]       Denese Killings, DDS  [address] Sabino Niemann [place], [place] [address]  Phone: [phone]  Fax: [phone]

## 2023-06-12 ENCOUNTER — Telehealth: Payer: Self-pay | Admitting: Internal Medicine

## 2023-06-12 MED ORDER — MONTELUKAST SODIUM 10 MG PO TABS
ORAL_TABLET | ORAL | 3 refills | 30.00000 days | Status: AC
Start: 2023-06-12 — End: ?

## 2023-06-12 NOTE — Telephone Encounter (Signed)
Patient calling back responding to the letter he received. He states that everything is going good, and he will see [place] in Jan. Please advise

## 2023-06-13 NOTE — Telephone Encounter (Signed)
Noted. Will fwd to gen card as Lorain Childes

## 2023-06-14 DIAGNOSIS — E1159 Type 2 diabetes mellitus with other circulatory complications: Secondary | ICD-10-CM | POA: Diagnosis not present

## 2023-06-14 DIAGNOSIS — J449 Chronic obstructive pulmonary disease, unspecified: Secondary | ICD-10-CM | POA: Diagnosis not present

## 2023-06-19 MED ORDER — MONTELUKAST SODIUM 10 MG PO TABS
ORAL_TABLET | 3 refills | Status: AC
Start: 2023-06-19 — End: ?

## 2023-06-26 NOTE — Progress Notes (Signed)
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 Had previously eval by Mark Cannon with dx of COPD but never pfts nor need for resp meds though very sedentary due to back pain     History of Present Illness  08/08/2022  Pulmonary/ 1st office eval/ Mark Cannon / [place] [facility]  Chief Complaint  Patient presents with   Consult    Copd exacerbation and pneumonia of LL lobe  Uses 3LO2 cont 24/7   Dyspnea:  back to baseline but 02 at 3 (was 4 lpm)  Cough: none  Sleep: flat  SABA use: has duoneb not using  02 3lpm  Presyncope since d/c  Chronic L > R leg swelling no change  Rec Go to ER:  Admit date:     08/08/2022  Discharge date: 08/09/22   Discharge Diagnoses: Principal Problem:   Symptomatic anemia   Acute respiratory failure with hypoxia (HCC)   Essential hypertension   GERD (gastroesophageal reflux disease)   Iron deficiency anemia   Brief Hospital Course: Mark Cannon is a 72 y.o. male with medical history significant of arthritis, cancer, GERD, hyperlipidemia, hypertension presents to the ED with a chief complaint of difficulty breathing.  Patient reports that has been gradually in onset over the last 2 weeks.  The dyspnea is exertional.  His oxygen sats dropped down to 75% while he is exerting himself.  Sitting down situating his oxygen and trying to rest makes it better.  He reports the lowest oxygen setting he is seen is 65% that was just prior to his previous hospitalization.  Patient reports no fevers, no cough.  He is wearing 4 L nasal cannula at baseline at home.  He reports he is not using his nebulizer because he ran out of medication for it.  He does not have any chest pains.  He does have some dizziness.  He has had no loss of consciousness.  No change in vision.  He reports he does  have a ringing in his ear sometimes.  Patient reports that he did quit smoking last month.  He has orthopnea at home that he reports is resolved but laying on his left or right side.  He reports no peripheral edema.  Patient reports a family history with his mother dying of "low hemoglobin."  According to patient, mother had been anemic for the last couple years of her life and had to have recurrent transfusions and there was never any etiology identified.   Patient does not smoke, does not drink, does not use illicit drugs.  He is vaccinated for COVID.  Patient is full code.   Assessment and Plan: * Symptomatic anemia -Hemoglobin down to 7.6 - Latest hemoglobin about 1 month ago around 11 - Excellent response to 1 unit PRBC transfusion during hospitalization -At discharge hemoglobin 8.9  -No signs of overt bleeding appreciated, patient's symptoms completely improved/resolved. -Case discussed with gastroenterology who recommended outpatient work-up for anemia and endoscopic evaluation if needed.   -Continue PPI and avoid any NSAIDs outside the use of baby enteric-coated aspirin.   -IV iron x1 also provided while inpatient as anemia panel demonstrated component of iron deficiency.   Obesity, Class I, BMI 30-34.9 -Body mass index is 34.86 kg/m. -Low-calorie diet, portion control and  increase physical activity discussed with patient.   Iron deficiency anemia - Anemia panel demonstrating component of iron deficiency anemia -Endoscopic evaluation GI work-up for anemia to be pursued as an outpatient -Continue PPI -Fecal occult blood test negative -Patient hemodynamically stable.   GERD (gastroesophageal reflux disease) -Continue Protonix -Lifestyle changes discussed with patient.   Essential hypertension -Resume home antihypertensive regimen. -Heart healthy diet discussed with patient.   Acute respiratory failure with hypoxia (HCC) -On oxygen at home since last month -Stable saturation  appreciated -Continue 4 L nasal cannula supplementation -Continue as needed bronchodilators -Very likely component of hypoxia unsure when the sensation driven by anemia. -Patient feeling ready to go home currently. -Outpatient follow-up with pulmonologist recommended. -No acute abnormalities appreciated on chest images.     Admit date:     11/13/2022  Discharge date: 11/15/22   Discharge Diagnoses: Principal Problem:   Acute on chronic respiratory failure with hypoxia (HCC)   Acute heart failure with preserved ejection fraction (HFpEF) (HCC)   Prostate cancer (HCC)   Lumbar stenosis with neurogenic claudication   Rectal bleeding   Stroke (cerebrum) (HCC)   COPD with acute exacerbation (HCC)   Essential hypertension   GERD (gastroesophageal reflux disease)   Iron deficiency anemia   Class 2 obesity   Acute on chronic respiratory failure (HCC)     12/09/2022  post hosp f/u ov/Mark Cannon re: doe/newly on 02 since pna fall 2023   maint on no resp meds/ never  had pfts  Chief Complaint  Patient presents with   Follow-up    CT scan  Feeling less SOB    Dyspnea:  limited by lower back/  Cough: just  clearing throat / not noticeable while sleeping  Sleeping: bed is level/ one pillow  SABA use: has duoneb not using  02: wearing 4lpm 24/7  Covid status:   JJ  Rec My office will be contacting you by phone for referral to PFT  and overnight oximetry  Make sure you check your oxygen saturation  AT  your highest level of activity (not after you stop)   to be sure it stays over 90%  Keep some candy handy :   - on 02 3lpm hs with no ex desats  on RA as of  12/09/22     01/21/2023  f/u ov/[place] office/Mark Cannon re: GOLD 3 copd  maint on no rx x for noct 02   Chief Complaint  Patient presents with   Follow-up    Breathing has improved. He has occ dry cough, PND.   Dyspnea:  home bound mostly / 75 ft gives out freq with fainty sensation > sob and not checking sats  Cough: none  Sleeping:  level bed one pillow s resp cc  SABA use: none  02: 3lpm at hs   Rec Trelegy 200 one click - take two good drags 1st thing in am - if breathing better then ok on sample then ok to  try Anoro one click each am  - rinse mouth after use     - ONO on 3lpm 02/20/23   < 4 min less than 89%  so no change = 3lpm    06/27/2023  f/u ov/[place] office/Donn[place]e [place] re: GOLD 3/ noct hypoxemia  maint on no rx  (stopped 02 on his own and all inhalers (not working) on ACEi  Chief Complaint  Patient presents with   Follow-up  Dyspnea:  limited by back  Cough: new since last ov harsh dry on lisinorpil  Sleeping: bed is level /one pillow s    resp cc  SABA use: none now  02: not using 02      No obvious day to day or daytime variability or assoc excess/ purulent sputum or mucus plugs or hemoptysis or cp or chest tightness, subjective wheeze or overt sinus or hb symptoms.    Also denies any obvious fluctuation of symptoms with weather or environmental changes or other aggravating or alleviating factors except as outlined above   No unusual exposure hx or h/o childhood pna/ asthma or knowledge of premature birth.  Current Allergies, Complete Past Medical History, Past Surgical History, Family History, and Social History were reviewed in [employer] record.  ROS  The following are not active complaints unless bolded Hoarseness, sore throat, dysphagia, dental problems, itching, sneezing,  nasal congestion or sensation of discharge of excess mucus or purulent secretions, ear ache,   fever, chills, sweats, unintended wt loss or wt gain, classically pleuritic or exertional cp,  orthopnea pnd or arm/hand swelling  or leg swelling, presyncope, palpitations, abdominal pain, anorexia, nausea, vomiting, diarrhea  or change in bowel habits or change in bladder habits, change in stools or change in urine, dysuria, hematuria,  rash, arthralgias, visual complaints, headache, numbness, weakness or ataxia  or problems with walking or coordination,  change in mood or  memory.        Current Meds  Medication Sig   docusate sodium (COLACE) 100 MG capsule Take 100 mg by mouth daily.   furosemide (LASIX) 40 MG tablet Take 1 tablet (40 mg total) by mouth daily as needed (swelling or shortness of breath).   glimepiride (AMARYL) 1 MG tablet Take 1 mg by mouth 2 (two) times daily.   HYDROcodone-acetaminophen (NORCO) 10-325 MG tablet Take 1 tablet by mouth 4 (four) times daily as needed for moderate pain.   lisinopril (ZESTRIL) 5 MG tablet Take 0.5 tablets (2.5 mg total) by mouth daily.   OXYGEN Inhale 3 L into the lungs continuous.   pantoprazole (PROTONIX) 40 MG tablet Take 1 tablet (40 mg total) by mouth daily.             Past Medical History:  Diagnosis Date   Arthritis    Cancer [facility])    Prostate, has had prostate removed, has had radiation and still has cancer,   Colon polyps    Cough    GERD (gastroesophageal reflux disease)    occasional    H/O tooth extraction week of 03/15/2015    History of kidney stones    Hypercholesteremia    Hypertension    Lumbar stenosis with neurogenic claudication    Pneumonia    Pre-diabetes    Renal disorder    Sleep apnea    No cpap       Objective:    Wts  06/27/2023       236  01/21/2023         227   12/09/22 228 lb (103.4 kg)  12/03/22 225 lb (102.1 kg)  11/29/22 218 lb 11.1 oz (99.2 kg)    Vital signs reviewed  06/27/2023  - Note at rest 02 sats  89% on RA   General appearance:    obese wm pleasantly cantankerous and avoids answering questions directly    HEENT : Oropharynx  clear/ no pnd/no cobblestoning   Nasal turbinates nl    NECK :  without  apparent JVD/ palpable Nodes/TM    LUNGS: no acc muscle use,  Mild barrel  contour chest wall with bilateral  Distant bs s audible wheeze and  without cough on insp or exp maneuvers  and mild  Hyperresonant  to  percussion bilaterally     CV:  RRR  no s3 or murmur or increase in P2,  and no edema   ABD:  obese soft and nontender with pos end  insp Hoover's  in the supine position.  No bruits or organomegaly appreciated   MS:  Nl gait/ ext warm without deformities Or obvious joint restrictions  calf tenderness, cyanosis or clubbing     SKIN: warm and dry without lesions    NEURO:  alert, approp, nl sensorium with  no motor or cerebellar deficits apparent.       I personally reviewed images and agree with radiology impression as follows:   Chest CT cuts on coronary study    There is no pleural effusion. The visualized lungs appear clear.       Assessment

## 2023-06-27 ENCOUNTER — Encounter: Payer: Self-pay | Admitting: Internal Medicine

## 2023-06-27 ENCOUNTER — Ambulatory Visit (INDEPENDENT_AMBULATORY_CARE_PROVIDER_SITE_OTHER): Payer: Medicare Other | Admitting: Internal Medicine

## 2023-06-27 VITALS — BP 130/74 | HR 54 | Ht 68.0 in | Wt 236.0 lb

## 2023-06-27 DIAGNOSIS — R0609 Other forms of dyspnea: Secondary | ICD-10-CM | POA: Diagnosis not present

## 2023-06-27 DIAGNOSIS — G4734 Idiopathic sleep related nonobstructive alveolar hypoventilation: Secondary | ICD-10-CM

## 2023-06-27 DIAGNOSIS — I10 Essential (primary) hypertension: Secondary | ICD-10-CM

## 2023-06-27 MED ORDER — VALSARTAN 80 MG PO TABS: 80.0000 mg | ORAL_TABLET | Freq: Every day | ORAL | 11 refills | Status: DC

## 2023-06-27 NOTE — Assessment & Plan Note (Signed)
D/c on 02  on 11/15/22 - on 02 3lpm hs with no ex desats  on RA as of  12/09/22  - ONO on 3lpm 02/20/23   < 4 min less than 89%  so no change 3lpm   For now declining noct 02/ advised to monitor it as closely as he can but acknowledging very hard to do accurately while sleeping obviously and prefer he just go ahead and wear it

## 2023-06-27 NOTE — Assessment & Plan Note (Signed)
D/c acei 06/27/2023 due to harsh cough / pseudowheeze  In the best review of chronic cough to date ( NEJM 2016 375 [id number]) ,  ACEi are now felt to cause cough in up to  20% of pts which is a 4 fold increase from previous reports and does not include the variety of non-specific complaints we see in pulmonary clinic in pts on ACEi but previously attributed to another dx like  Copd/asthma and  include PNDS, throat and chest congestion, "bronchitis", unexplained dyspnea and noct "strangling" sensations, and hoarseness, but also  atypical /refractory GERD symptoms like dysphagia and "bad heartburn"   The only way I know  to prove this is not an "ACEi Case" is a trial off ACEi x a minimum of 6 weeks then regroup.   >>> try diovan 80 mg daily and titrate if needed.    F/u  per PCP           Each maintenance medication was reviewed in detail including emphasizing most importantly the difference between maintenance and prns and under what circumstances the prns are to be triggered using an action plan format where appropriate.  Total time for H and P, chart review, counseling, reviewing hfa/dpi/pulse ox/ 02  device(s) and generating customized AVS unique to this office visit / same day charting > 30 min

## 2023-06-27 NOTE — Assessment & Plan Note (Signed)
Quit smoking Sept 14 2023  -  Onset of symptoms  2023  -  Echo 05/15/22 grad 1 diastolic dysfunction  - B R >> L pleural effusions:  R Tap 11/13/22  900 cc transudate in setting of severe anemia attributed to polyps > removed at admit  - 12/09/2022   Walked on ra   approx 75  ft  @ slow  pace, stopped due to feeling fainty with lowest 02 sats 96%  - PFT's  01/14/23  FEV1 1.34 (45 % ) ratio 0.54  p 0 % improvement from saba p 0 prior to study with DLCO  13.32 (55%)   and FV curve classically concave  and ERV 46%   at wt 230   - 01/21/2023  After extensive coaching inhaler device,  effectiveness =    75% (short ti with dpi > try telegy 200 sample then anoro 1 click each am and f/u prn   - 06/27/2023 d/c'd all inhalers "they don't work" with harsh cough on lisinopril and prominent pseudowheeze on exam   Clearly doe is multifactorial in this extremely sedentary mod obese man and adding bronchodilators (esp if this is an acei case as I suspect) is not helping him and may backfire due to upper airway side effects   F/u in 3 m off everything but approp saba/ call sooner if needed  Re SABA :  I spent extra time with pt today reviewing appropriate use of albuterol for prn use on exertion with the following points: 1) saba is for relief of sob that does not improve by walking a slower pace or resting but rather if the pt does not improve after trying this first. 2) If the pt is convinced, as many are, that saba helps recover from activity faster then it's easy to tell if this is the case by re-challenging : ie stop, take the inhaler, then p 5 minutes try the exact same activity (intensity of workload) that just caused the symptoms and see if they are substantially diminished or not after saba 3) if there is an activity that reproducibly causes the symptoms, try the saba 15 min before the activity on alternate days   If in fact the saba really does help, then fine to continue to use it prn but advised may need to  look closer at the maintenance regimen (for now = 0)  being used to achieve better control of airways disease with exertion.

## 2023-06-27 NOTE — Patient Instructions (Addendum)
Stop lisinopril (it is causing your wheezing in your throat) - also try to avoid clearing your throat (jolley ranchers)  Start valsartan 80 mg one daily in place of lisinopril and let your PCP know when you return    Only use your albuterol as a rescue medication to be used if you can't catch your breath by resting or doing a relaxed purse lip breathing pattern.  - The less you use it, the better it will work when you need it. - Ok to use up to 2 puffs  every 4 hours if you must but call for immediate appointment if use goes up over your usual need - Don't leave home without it !!  (think of it like starter fluid)   Also  Ok to try albuterol 15 min before an activity (on alternating days)  that you know would usually make you short of breath and see if it makes any difference and if makes none then don't take albuterol after activity unless you can't catch your breath as this means it's the resting that helps, not the albuterol.   Please schedule a follow up visit in 3 months but call sooner if needed

## 2023-06-30 ENCOUNTER — Ambulatory Visit: Payer: MEDICARE

## 2023-06-30 DIAGNOSIS — J4550 Severe persistent asthma, uncomplicated: Secondary | ICD-10-CM

## 2023-06-30 DIAGNOSIS — J4489 Asthma with chronic obstructive pulmonary disease (COPD) (HCC/RAF): Secondary | ICD-10-CM

## 2023-06-30 MED ORDER — FLUTICASONE-SALMETEROL 500-50 MCG/ACT IN AEPB
11 refills | Status: AC
Start: 2023-06-30 — End: ?

## 2023-07-01 MED ORDER — FLUTICASONE-SALMETEROL 500-50 MCG/ACT IN AEPB
1 | Freq: Two times a day (BID) | RESPIRATORY_TRACT | 4 refills | Status: AC
Start: 2023-07-01 — End: ?

## 2023-07-08 MED ORDER — FLUTICASONE-SALMETEROL 500-50 MCG/ACT IN AEPB
1 | Freq: Two times a day (BID) | RESPIRATORY_TRACT | 0 refills | Status: AC
Start: 2023-07-08 — End: ?

## 2023-07-14 DIAGNOSIS — E1159 Type 2 diabetes mellitus with other circulatory complications: Secondary | ICD-10-CM | POA: Diagnosis not present

## 2023-07-14 DIAGNOSIS — J9611 Chronic respiratory failure with hypoxia: Secondary | ICD-10-CM | POA: Diagnosis not present

## 2023-07-14 DIAGNOSIS — J449 Chronic obstructive pulmonary disease, unspecified: Secondary | ICD-10-CM | POA: Diagnosis not present

## 2023-07-28 ENCOUNTER — Ambulatory Visit (INDEPENDENT_AMBULATORY_CARE_PROVIDER_SITE_OTHER): Payer: Medicare Other | Admitting: Gastroenterology

## 2023-07-28 VITALS — BP 118/67 | HR 68 | Temp 98.0°F | Ht 68.0 in | Wt 239.5 lb

## 2023-07-28 DIAGNOSIS — D500 Iron deficiency anemia secondary to blood loss (chronic): Secondary | ICD-10-CM | POA: Diagnosis not present

## 2023-07-28 DIAGNOSIS — K5520 Angiodysplasia of colon without hemorrhage: Secondary | ICD-10-CM

## 2023-07-28 NOTE — Patient Instructions (Signed)
Perform blood workup Continue ferrous sulfate 325 mg qday Repeat colonoscopy in 11/2023

## 2023-07-28 NOTE — Progress Notes (Signed)
Katrinka Blazing, M.D. Gastroenterology & Hepatology [facility] [address] [place], [place] [address]  Primary Care Physician: Avis Epley, PA-C [address] [place] [address]  I will communicate my assessment and recommendations to the referring MD via EMR.  Problems: Small bowel AVMs Iron deficiency anemia.   History of Present Illness: Mark Cannon is a 72 y.o. male with past medical history of small bowel AVMs, iron-deficiency anemia, prostate cancer, arthritis, diabetes, GERD, hypertension, hyperlipidemia, OSA and COPD, who presents for follow up of iron deficiency anemia.  The patient was last seen on 01/16/2023. At that time, the patient was advised to continue taking ferrous sulfate 325 mg twice a day.  Most recent labs from 05/07/2023 showed a hemoglobin of 11.1, MCV 93, WBC 8.2 and platelets 277.  He is currently taking ferrous sulfate once a day. Decreased the dose a couple of months ago. The patient denies having any nausea, vomiting, fever, chills, hematochezia, melena, hematemesis, abdominal distention, abdominal pain, diarrhea, jaundice, pruritus or weight loss.  Last EGD/enteroscopy: 12/03/2022 1 cm hiatal hernia, small nodules in the duodenal bulb biopsied, had presence of 10 AVMs in the duodenum and in the proximal jejunum which were ablated with APC.  There were 2 more AVMs in the most distal area but this could not be ablated despite performing abdominal pressure.   Last Colonoscopy: 12/03/2022  - Preparation of the colon was fair. - Two 1 to 2 mm polyps in the cecum, removed with a cold biopsy forceps. Resected and retrieved. - One 16 mm polyp in the ascending colon, removed piecemeal EMR using a hot snare. Resected and retrieved. Injected. Clip was placed. Clip manufacturer: [employer]. - Seven 3 to 12 mm polyps in the rectum, in the transverse colon and in the ascending colon, removed with a cold  snare. Resected and retrieved. - Diverticulosis in the entire examined colon. - Non- bleeding internal hemorrhoids.   Last capsule endoscopy 11/15/22 Moderate sized angiodysplastic in the proximal small bowel.   Past Medical History: Past Medical History:  Diagnosis Date   Arthritis    Cancer [facility])    Prostate, has had prostate removed, has had radiation and still has cancer,   Chronic respiratory failure (HCC)    Colon polyps    Cough    Diabetes mellitus without complication (HCC)    Dyspnea    GERD (gastroesophageal reflux disease)    occasional    H/O tooth extraction week of 03/15/2015    History of kidney stones    Hypercholesteremia    Hypertension    Lumbar stenosis with neurogenic claudication    Pneumonia    Pre-diabetes    Renal disorder    Sleep apnea    No cpap    Past Surgical History: Past Surgical History:  Procedure Laterality Date   APPENDECTOMY     BACK SURGERY     BIOPSY  12/03/2022   Procedure: BIOPSY;  Surgeon: Dolores Frame, MD;  [facility];  Service: Gastroenterology;;   CHOLECYSTECTOMY     COLONOSCOPY  02/04/2012   Procedure: COLONOSCOPY;  Surgeon: Dalia Heading, MD;  [facility];  Service: Gastroenterology;  Laterality: N/A;   COLONOSCOPY N/A 09/22/2018   Procedure: COLONOSCOPY;  Surgeon: Franky Macho, MD;  [facility];  Service: Gastroenterology;  Laterality: N/A;   COLONOSCOPY W/ POLYPECTOMY     COLONOSCOPY WITH PROPOFOL N/A 10/15/2022   Procedure: COLONOSCOPY WITH PROPOFOL;  Surgeon:  Lanelle Bal, DO;  [facility];  Service: Endoscopy;  Laterality: N/A;   COLONOSCOPY WITH PROPOFOL N/A 12/03/2022   Procedure: COLONOSCOPY WITH PROPOFOL;  Surgeon: Dolores Frame, MD;  [facility];  Service: Gastroenterology;  Laterality: N/A;  12:00pm, asa 3   ENTEROSCOPY N/A 12/03/2022   Procedure: ENTEROSCOPY;  Surgeon: Dolores Frame, MD;  [facility];   Service: Gastroenterology;  Laterality: N/A;  push enteroscopy   ESOPHAGOGASTRODUODENOSCOPY (EGD) WITH PROPOFOL N/A 10/15/2022   Procedure: ESOPHAGOGASTRODUODENOSCOPY (EGD) WITH PROPOFOL;  Surgeon: Lanelle Bal, DO;  [facility];  Service: Endoscopy;  Laterality: N/A;   GIVENS CAPSULE STUDY N/A 11/14/2022   Procedure: GIVENS CAPSULE STUDY;  Surgeon: Corbin Ade, MD;  [facility];  Service: Endoscopy;  Laterality: N/A;   HEMOSTASIS CLIP PLACEMENT  12/03/2022   Procedure: HEMOSTASIS CLIP PLACEMENT;  Surgeon: Dolores Frame, MD;  [facility];  Service: Gastroenterology;;   HERNIA REPAIR     Bilateral inguinal hernia repair X 4   HOT HEMOSTASIS  12/03/2022   Procedure: HOT HEMOSTASIS (ARGON PLASMA COAGULATION/BICAP);  Surgeon: Marguerita Merles, Reuel Boom, MD;  [facility];  Service: Gastroenterology;;   hydrocelectomy     left knee arthroscopy     LITHOTRIPSY     LYMPHADENECTOMY Bilateral 03/22/2015   Procedure: PELVIC LYMPHADENECTOMY;  Surgeon: Sebastian Ache, MD;  [facility];  Service: Urology;  Laterality: Bilateral;   NM MYOVIEW LTD  2011   POLYPECTOMY  09/22/2018   Procedure: POLYPECTOMY;  Surgeon: Franky Macho, MD;  [facility];  Service: Gastroenterology;;   POLYPECTOMY  10/15/2022   Procedure: POLYPECTOMY;  Surgeon: Lanelle Bal, DO;  [facility];  Service: Endoscopy;;   POLYPECTOMY  12/03/2022   Procedure: POLYPECTOMY;  Surgeon: Dolores Frame, MD;  [facility];  Service: Gastroenterology;;   ROBOT ASSISTED LAPAROSCOPIC RADICAL PROSTATECTOMY N/A 03/22/2015   Procedure: ROBOTIC ASSISTED LAPAROSCOPIC RADICAL PROSTATECTOMY WITH INDOCYANINE GREEN DYE INJECTION;  Surgeon: Sebastian Ache, MD;  Location: [facility];  Service: Urology;  Laterality: N/A;   ROBOTIC ASSISTED LAPAROSCOPIC LYSIS OF ADHESION  03/22/2015   Procedure: ROBOTIC ASSISTED LAPAROSCOPIC LYSIS OF ADHESION;   Surgeon: Sebastian Ache, MD;  [facility];  Service: Urology;;   SHOULDER SURGERY Right    SUBMUCOSAL LIFTING INJECTION  12/03/2022   Procedure: SUBMUCOSAL LIFTING INJECTION;  Surgeon: Marguerita Merles, Reuel Boom, MD;  [facility];  Service: Gastroenterology;;   VASECTOMY      Family History: Family History  Problem Relation Age of Onset   Heart failure Father    Stroke Father    Colon cancer Neg Hx     Social History: Social History   Tobacco Use  Smoking Status Former   Current packs/day: 1.00   Average packs/day: 1 pack/day for 55.0 years (55.0 ttl pk-yrs)   Types: Cigarettes   Passive exposure: Past  Smokeless Tobacco Never   Social History   Substance and Sexual Activity  Alcohol Use Not Currently   Alcohol/week: 1.0 standard drink of alcohol   Types: 1 Glasses of wine per week   Comment: one glass of wine once a month   Social History   Substance and Sexual Activity  Drug Use No    Allergies: Allergies  Allergen Reactions   Lidocaine Hives   Benzocaine Other (See Comments)    Blisters    Benzocaine    Other  Novocaine - blisters in mouth    Medications: Current Outpatient Medications  Medication Sig Dispense Refill   ferrous sulfate 325 (65 FE) MG tablet Take 1 tablet (325 mg total) by mouth 3 (three) times daily with meals. 90 tablet 2   glimepiride (AMARYL) 1 MG tablet Take 1 mg by mouth 2 (two) times daily.     HYDROcodone-acetaminophen (NORCO) 10-325 MG tablet Take 1 tablet by mouth 4 (four) times daily as needed for moderate pain (pain score 4-6). Takes 1/2 tablet     pantoprazole (PROTONIX) 40 MG tablet Take 1 tablet (40 mg total) by mouth daily. 30 tablet 2   valsartan (DIOVAN) 80 MG tablet Take 1 tablet (80 mg total) by mouth daily. 30 tablet 11   Vitamin D, Ergocalciferol, 50000 units CAPS Take 1 capsule by mouth once a week.     docusate sodium (COLACE) 100 MG capsule Take 100 mg by mouth daily. (Patient not taking: Reported  on 07/28/2023)     furosemide (LASIX) 40 MG tablet Take 1 tablet (40 mg total) by mouth daily as needed (swelling or shortness of breath). (Patient not taking: Reported on 07/28/2023) 30 tablet 1   OXYGEN Inhale 3 L into the lungs continuous. (Patient not taking: Reported on 07/28/2023)     rosuvastatin (CRESTOR) 40 MG tablet Take 1 tablet (40 mg total) by mouth daily. (Patient not taking: Reported on 07/28/2023) 90 tablet 3   No current facility-administered medications for this visit.    Review of Systems: GENERAL: negative for malaise, night sweats HEENT: No changes in hearing or vision, no nose bleeds or other nasal problems. NECK: Negative for lumps, goiter, pain and significant neck swelling RESPIRATORY: Negative for cough, wheezing CARDIOVASCULAR: Negative for chest pain, leg swelling, palpitations, orthopnea GI: SEE HPI MUSCULOSKELETAL: Negative for joint pain or swelling, back pain, and muscle pain. SKIN: Negative for lesions, rash PSYCH: Negative for sleep disturbance, mood disorder and recent psychosocial stressors. HEMATOLOGY Negative for prolonged bleeding, bruising easily, and swollen nodes. ENDOCRINE: Negative for cold or heat intolerance, polyuria, polydipsia and goiter. NEURO: negative for tremor, gait imbalance, syncope and seizures. The remainder of the review of systems is noncontributory.   Physical Exam: BP 118/67 (BP Location: Right Arm, Patient Position: Sitting, Cuff Size: Large)   Pulse 68   Temp 98 F (36.7 C) (Oral)   Ht 5\' 8"  (1.727 m)   Wt 239 lb 8 oz (108.6 kg)   SpO2 91%   BMI 36.42 kg/m  GENERAL: The patient is AO x3, in no acute distress. HEENT: Head is normocephalic and atraumatic. EOMI are intact. Mouth is well hydrated and without lesions. NECK: Supple. No masses LUNGS: Clear to auscultation. No presence of rhonchi/wheezing/rales. Adequate chest expansion HEART: RRR, normal s1 and s2. ABDOMEN: Soft, nontender, no guarding, no peritoneal  signs, and nondistended. BS +. No masses. EXTREMITIES: Without any cyanosis, clubbing, rash, lesions or edema. NEUROLOGIC: AOx3, no focal motor deficit. SKIN: no jaundice, no rashes  Imaging/Labs: as above  I personally reviewed and interpreted the available labs, imaging and endoscopic files.  Impression and Plan: Mark Cannon is a 72 y.o. male with past medical history of small bowel AVMs, iron-deficiency anemia, prostate cancer, arthritis, diabetes, GERD, hypertension, hyperlipidemia, OSA and COPD, who presents for follow up of iron deficiency anemia due to AVMs.  Patient had multiple AVMs ablated during most recent push enteroscopy and he since then his hemoglobin has remained stable.  In fact, his most recent hemoglobin is better compared  to prior.  At this point, we will recheck his iron stores and I advised him to continue with his current dose of ferrous sulfate for now.  If his iron stores were to decrease he may need to go back to twice a day dosing.  If worsening anemia, will need to proceed with enteroscopy later on.  -Check CBC and iron stores -Continue ferrous sulfate 325 mg qday -Repeat colonoscopy in 11/2023  All questions were answered.      Katrinka Blazing, MD Gastroenterology and Hepatology [facility]astroenter[facility]

## 2023-07-31 DIAGNOSIS — K5520 Angiodysplasia of colon without hemorrhage: Secondary | ICD-10-CM | POA: Diagnosis not present

## 2023-07-31 DIAGNOSIS — D500 Iron deficiency anemia secondary to blood loss (chronic): Secondary | ICD-10-CM | POA: Diagnosis not present

## 2023-08-01 LAB — CBC WITH DIFFERENTIAL/PLATELET
Basophils Absolute: 0.1 10*3/uL (ref 0.0–0.2)
Basos: 1 %
EOS (ABSOLUTE): 0.3 10*3/uL (ref 0.0–0.4)
Eos: 3 %
Hematocrit: 46.4 % (ref 37.5–51.0)
Hemoglobin: 14.5 g/dL (ref 13.0–17.7)
Immature Grans (Abs): 0 10*3/uL (ref 0.0–0.1)
Immature Granulocytes: 0 %
Lymphocytes Absolute: 1.8 10*3/uL (ref 0.7–3.1)
Lymphs: 19 %
MCH: 29.1 pg (ref 26.6–33.0)
MCHC: 31.3 g/dL — ABNORMAL LOW (ref 31.5–35.7)
MCV: 93 fL (ref 79–97)
Monocytes Absolute: 0.6 10*3/uL (ref 0.1–0.9)
Monocytes: 6 %
Neutrophils Absolute: 7 10*3/uL (ref 1.4–7.0)
Neutrophils: 71 %
Platelets: 272 10*3/uL (ref 150–450)
RBC: 4.98 x10E6/uL (ref 4.14–5.80)
RDW: 13.8 % (ref 11.6–15.4)
WBC: 9.8 10*3/uL (ref 3.4–10.8)

## 2023-08-01 LAB — IRON,TIBC AND FERRITIN PANEL
Ferritin: 39 ng/mL (ref 30–400)
Iron Saturation: 67 % — ABNORMAL HIGH (ref 15–55)
Iron: 236 ug/dL — ABNORMAL HIGH (ref 38–169)
Total Iron Binding Capacity: 353 ug/dL (ref 250–450)
UIBC: 117 ug/dL (ref 111–343)

## 2023-08-04 ENCOUNTER — Encounter (INDEPENDENT_AMBULATORY_CARE_PROVIDER_SITE_OTHER): Payer: Self-pay

## 2023-08-04 DIAGNOSIS — J449 Chronic obstructive pulmonary disease, unspecified: Secondary | ICD-10-CM | POA: Diagnosis not present

## 2023-08-04 DIAGNOSIS — E6609 Other obesity due to excess calories: Secondary | ICD-10-CM | POA: Diagnosis not present

## 2023-08-04 DIAGNOSIS — M170 Bilateral primary osteoarthritis of knee: Secondary | ICD-10-CM | POA: Diagnosis not present

## 2023-08-04 DIAGNOSIS — Z6836 Body mass index (BMI) 36.0-36.9, adult: Secondary | ICD-10-CM | POA: Diagnosis not present

## 2023-08-04 DIAGNOSIS — E1159 Type 2 diabetes mellitus with other circulatory complications: Secondary | ICD-10-CM | POA: Diagnosis not present

## 2023-08-04 DIAGNOSIS — M5134 Other intervertebral disc degeneration, thoracic region: Secondary | ICD-10-CM | POA: Diagnosis not present

## 2023-08-14 DIAGNOSIS — J9611 Chronic respiratory failure with hypoxia: Secondary | ICD-10-CM | POA: Diagnosis not present

## 2023-08-14 DIAGNOSIS — J449 Chronic obstructive pulmonary disease, unspecified: Secondary | ICD-10-CM | POA: Diagnosis not present

## 2023-08-14 DIAGNOSIS — E1159 Type 2 diabetes mellitus with other circulatory complications: Secondary | ICD-10-CM | POA: Diagnosis not present

## 2023-08-21 DIAGNOSIS — C61 Malignant neoplasm of prostate: Secondary | ICD-10-CM | POA: Diagnosis not present

## 2023-08-28 DIAGNOSIS — C775 Secondary and unspecified malignant neoplasm of intrapelvic lymph nodes: Secondary | ICD-10-CM | POA: Diagnosis not present

## 2023-08-28 DIAGNOSIS — C61 Malignant neoplasm of prostate: Secondary | ICD-10-CM | POA: Diagnosis not present

## 2023-08-28 DIAGNOSIS — N393 Stress incontinence (female) (male): Secondary | ICD-10-CM | POA: Diagnosis not present

## 2023-09-04 ENCOUNTER — Telehealth: Payer: MEDICARE

## 2023-09-04 ENCOUNTER — Ambulatory Visit: Payer: BLUE CROSS/BLUE SHIELD

## 2023-09-04 NOTE — Telephone Encounter
Call Back Request      Reason for call back: Pt's assistant Zella Ball called with regards to pt's email that she stated was sent to [facility] email. She advised that it was with regards to pt's cpap retainer appointment. She wanted to know what the next steps are for pt.   Please assist     Any Symptoms:  []  Yes  [x]  No      If yes, what symptoms are you experiencing:    Duration of symptoms (how long):    Have you taken medication for symptoms (OTC or Rx):      If call was taken outside of clinic hours:    [] Patient or caller has been notified that this message was sent outside of normal clinic hours.     [] Patient or caller has been warm transferred to the physician's answering service. If applicable, patient or caller informed to please call us back if symptoms progress.  Patient or caller has been notified of the turnaround time of 1-2 business day(s).

## 2023-09-04 NOTE — Telephone Encounter
Reached out to pt assistant Zella Ball) in regard to the email she sent to Dr Evelena Leyden.    Robin forwarded me email.    Pt was referred to dental clinic and are still awaiting appointment.    Sent email to dental clinic scheduling staff for assistance with scheduling

## 2023-09-13 DIAGNOSIS — J449 Chronic obstructive pulmonary disease, unspecified: Secondary | ICD-10-CM | POA: Diagnosis not present

## 2023-09-13 DIAGNOSIS — E782 Mixed hyperlipidemia: Secondary | ICD-10-CM | POA: Diagnosis not present

## 2023-09-13 DIAGNOSIS — E1159 Type 2 diabetes mellitus with other circulatory complications: Secondary | ICD-10-CM | POA: Diagnosis not present

## 2023-10-14 DIAGNOSIS — E1159 Type 2 diabetes mellitus with other circulatory complications: Secondary | ICD-10-CM | POA: Diagnosis not present

## 2023-10-14 DIAGNOSIS — I10 Essential (primary) hypertension: Secondary | ICD-10-CM | POA: Diagnosis not present

## 2023-10-14 DIAGNOSIS — E782 Mixed hyperlipidemia: Secondary | ICD-10-CM | POA: Diagnosis not present

## 2023-10-17 ENCOUNTER — Encounter (INDEPENDENT_AMBULATORY_CARE_PROVIDER_SITE_OTHER): Payer: Self-pay | Admitting: *Deleted

## 2023-10-19 NOTE — Progress Notes (Deleted)
 Mark Cannon, male    DOB: [birth year]    MRN: [id number]   Brief patient profile:  72 yowm quit smoking Sept 14 2023  / wife is retired [employer]  referred to pulmonary clinic in [place]  08/08/2022 by [facility]  for f/u pna and 02 dependence. Had previously eval by Mark Cannon with dx of COPD but never pfts nor need for resp meds though very sedentary due to back pain     History of Present Illness  08/08/2022  Pulmonary/ 1st office eval/ Mark Cannon / [place] [facility]  Chief Complaint  Patient presents with   Consult    Copd exacerbation and pneumonia of LL lobe  Uses 3LO2 cont 24/7   Dyspnea:  back to baseline but 02 at 3 (was 4 lpm)  Cough: none  Sleep: flat  SABA use: has duoneb not using  02 3lpm  Rec To ER for presyncope     Admit date:     11/13/2022  Discharge date: 11/15/22   Discharge Diagnoses: Principal Problem:   Acute on chronic respiratory failure with hypoxia (HCC)   Acute heart failure with preserved ejection fraction (HFpEF) (HCC)   Prostate cancer (HCC)   Lumbar stenosis with neurogenic claudication   Rectal bleeding   Stroke (cerebrum) (HCC)   COPD with acute exacerbation (HCC)   Essential hypertension   GERD (gastroesophageal reflux disease)   Iron  deficiency anemia   Class 2 obesity   Acute on chronic respiratory failure (HCC)     12/09/2022  post hosp f/u ov/Mark Cannon re: doe/newly on 02 since pna fall 2023   maint on no resp meds/ never  had pfts  Chief Complaint  Patient presents with   Follow-up    CT scan  Feeling less SOB    Dyspnea:  limited by lower back/  Cough: just  clearing throat / not noticeable while sleeping  Sleeping: bed is level/ one pillow  SABA use: has duoneb not using  02: wearing 4lpm 24/7  Covid status:   JJ  Rec My office will be contacting you by phone for referral to PFT  and overnight oximetry  Make sure you check your oxygen  saturation  AT  your highest level of activity (not after you stop)   to be sure it stays over 90%   Keep some candy handy :   - on 02 3lpm hs with no ex desats  on RA as of  12/09/22     01/21/2023  f/u ov/Mark Cannon office/Mark Cannon re: GOLD 3 copd  maint on no rx x for noct 02   Chief Complaint  Patient presents with   Follow-up    Breathing has improved. He has occ dry cough, PND.   Dyspnea:  home bound mostly / 75 ft gives out freq with fainty sensation > sob and not checking sats  Cough: none  Sleeping: level bed one pillow s resp cc  SABA use: none  02: 3lpm at hs   Rec Trelegy 200 one click - take two good drags 1st thing in am - if breathing better then ok on sample then ok to  try Anoro one click each am  - rinse mouth after use     - ONO on 3lpm 02/20/23   < 4 min less than 89%  so no change = 3lpm    06/27/2023  f/u ov/ office/Mark Cannon re: GOLD 3/ noct hypoxemia  maint on no rx  (stopped 02 on his own and all inhalers (not working) on ACEi  Chief Complaint  Patient presents with   Follow-up  Dyspnea:  limited by back  Cough: new since last ov harsh dry on lisinorpil  Sleeping: bed is level /one pillow s    resp cc  SABA use: none now  02: not using 02  Rec Stop lisinopril  (it is causing your wheezing in your throat) - also try to avoid clearing your throat (jolley ranchers) Start valsartan  80 mg one daily in place of lisinopril  and let your PCP know when you return  Only use your albuterol  as a rescue medication  Also  Ok to try albuterol  15 min before an activity (on alternating days)  that you know would usually make you short of breath  Please schedule a follow up visit in 3 months but call sooner if needed     10/20/2023 3 m  f/u ov/[place] office/Mark Cannon re: *** maint on ***  No chief complaint on file.   Dyspnea:  *** Cough: *** Sleeping: ***   resp cc  SABA use: *** 02: ***  Lung cancer screening: ***   No obvious day to day or daytime variability or assoc excess/ purulent sputum or mucus plugs or hemoptysis or cp or chest tightness, subjective  wheeze or overt sinus or hb symptoms.    Also denies any obvious fluctuation of symptoms with weather or environmental changes or other aggravating or alleviating factors except as outlined above   No unusual exposure hx or h/o childhood pna/ asthma or knowledge of premature birth.  Current Allergies, Complete Past Medical History, Past Surgical History, Family History, and Social History were reviewed in [employer] record.  ROS  The following are not active complaints unless bolded Hoarseness, sore throat, dysphagia, dental problems, itching, sneezing,  nasal congestion or discharge of excess mucus or purulent secretions, ear ache,   fever, chills, sweats, unintended wt loss or wt gain, classically pleuritic or exertional cp,  orthopnea pnd or arm/hand swelling  or leg swelling, presyncope, palpitations, abdominal pain, anorexia, nausea, vomiting, diarrhea  or change in bowel habits or change in bladder habits, change in stools or change in urine, dysuria, hematuria,  rash, arthralgias, visual complaints, headache, numbness, weakness or ataxia or problems with walking or coordination,  change in mood or  memory.        No outpatient medications have been marked as taking for the 10/20/23 encounter (Appointment) with Mark Ozell NOVAK, MD.                Past Medical History:  Diagnosis Date   Arthritis    Cancer [facility])    Prostate, has had prostate removed, has had radiation and still has cancer,   Colon polyps    Cough    GERD (gastroesophageal reflux disease)    occasional    H/O tooth extraction week of 03/15/2015    History of kidney stones    Hypercholesteremia    Hypertension    Lumbar stenosis with neurogenic claudication    Pneumonia    Pre-diabetes    Renal disorder    Sleep apnea    No cpap       Objective:    Wts  10/20/2023         ***  06/27/2023       236  01/21/2023         227   12/09/22 228 lb (103.4 kg)  12/03/22 225 lb (102.1 kg)   11/29/22 218 lb 11.1 oz (99.2 kg)  Vital signs reviewed  10/20/2023  - Note at rest 02 sats  ***% on ***   General appearance:    ***     Mild bar***         Assessment

## 2023-10-20 ENCOUNTER — Ambulatory Visit: Payer: Medicare Other | Admitting: Internal Medicine

## 2023-10-27 ENCOUNTER — Telehealth (INDEPENDENT_AMBULATORY_CARE_PROVIDER_SITE_OTHER): Payer: Self-pay | Admitting: Gastroenterology

## 2023-10-27 NOTE — Telephone Encounter (Signed)
 Pt called in and states he received his questionnaire. States he had a colonoscopy not to long ago and at this time he is not going to have one so we can cancel the questionnaire.

## 2023-10-27 NOTE — Telephone Encounter (Signed)
 This was for his 1 year repeat colonoscopy

## 2023-10-27 NOTE — Telephone Encounter (Signed)
 Please let him know that he had 9 polyps removed in his last colonoscopy one of them was very large and I recommended a repeat colonoscopy 1 year after his most recent 1.  This will be my final recommendation.

## 2023-10-28 ENCOUNTER — Encounter (INDEPENDENT_AMBULATORY_CARE_PROVIDER_SITE_OTHER): Payer: Self-pay

## 2023-10-28 NOTE — Telephone Encounter (Signed)
 Reply via my chart from patient No at this time, thank you. Mark Cannon

## 2023-10-28 NOTE — Telephone Encounter (Signed)
 Mychart message sent to patient.

## 2023-11-10 ENCOUNTER — Encounter: Payer: Self-pay | Admitting: Internal Medicine

## 2023-11-10 ENCOUNTER — Ambulatory Visit: Payer: Medicare Other | Attending: Internal Medicine | Admitting: Internal Medicine

## 2023-11-10 VITALS — BP 128/80 | HR 61 | Ht 68.0 in | Wt 235.8 lb

## 2023-11-10 DIAGNOSIS — I10 Essential (primary) hypertension: Secondary | ICD-10-CM | POA: Diagnosis not present

## 2023-11-10 DIAGNOSIS — I5032 Chronic diastolic (congestive) heart failure: Secondary | ICD-10-CM | POA: Diagnosis not present

## 2023-11-10 NOTE — Patient Instructions (Signed)
Medication Instructions:  Your physician recommends that you continue on your current medications as directed. Please refer to the Current Medication list given to you today.   Labwork: None today  Testing/Procedures: None today  Follow-Up: As needed  Any Other Special Instructions Will Be Listed Below (If Applicable).  If you need a refill on your cardiac medications before your next appointment, please call your pharmacy.    Thank you for choosing [facility] !

## 2023-11-10 NOTE — Progress Notes (Signed)
Cardiology Office Note  Date: 11/10/2023   ID: Fredrich, Cory 10/11/1951, MRN [id number]  PCP:  Avis Epley, PA-C  Cardiologist:  Marjo Bicker, MD Electrophysiologist:  None    History of Present Illness: Mark Cannon is a 73 y.o. male known to have HTN, DM 2, OSA not on CPAP but on chronic oxygen therapy, COPD, chronic diastolic heart failure is here for follow-up visit.  Previously had chronic dizziness which was later deemed to be from multiple antihypertensive medications.  After stopping amlodipine and Lasix, his dizziness resolved completely.  Currently on bisoprolol 10 mg once daily and valsartan 80 mg once daily for HTN.  Home BP reviewed, 130 to 140 mmHg SBP, controlled.  No symptoms.  Overall doing great.  No angina, DOE, dizziness, palpitations, syncope or leg swelling.  Not using CPAP.  He underwent event monitor in 2024 that did not show any evidence of arrhythmias.  He also underwent echocardiogram in 2020 where he that showed normal LVEF and no evidence of valvular heart disease.  Past Medical History:  Diagnosis Date   Arthritis    Cancer [facility])    Prostate, has had prostate removed, has had radiation and still has cancer,   Chronic respiratory failure (HCC)    Colon polyps    Cough    Diabetes mellitus without complication (HCC)    Dyspnea    GERD (gastroesophageal reflux disease)    occasional    H/O tooth extraction week of 03/15/2015    History of kidney stones    Hypercholesteremia    Hypertension    Lumbar stenosis with neurogenic claudication    Pneumonia    Pre-diabetes    Renal disorder    Sleep apnea    No cpap    Past Surgical History:  Procedure Laterality Date   APPENDECTOMY     BACK SURGERY     BIOPSY  12/03/2022   Procedure: BIOPSY;  Surgeon: Dolores Frame, MD;  [facility];  Service: Gastroenterology;;   CHOLECYSTECTOMY     COLONOSCOPY  02/04/2012   Procedure: COLONOSCOPY;  Surgeon: Dalia Heading, MD;  [facility];  Service: Gastroenterology;  Laterality: N/A;   COLONOSCOPY N/A 09/22/2018   Procedure: COLONOSCOPY;  Surgeon: Franky Macho, MD;  [facility];  Service: Gastroenterology;  Laterality: N/A;   COLONOSCOPY W/ POLYPECTOMY     COLONOSCOPY WITH PROPOFOL N/A 10/15/2022   Procedure: COLONOSCOPY WITH PROPOFOL;  Surgeon: Lanelle Bal, DO;  [facility];  Service: Endoscopy;  Laterality: N/A;   COLONOSCOPY WITH PROPOFOL N/A 12/03/2022   Procedure: COLONOSCOPY WITH PROPOFOL;  Surgeon: Dolores Frame, MD;  [facility];  Service: Gastroenterology;  Laterality: N/A;  12:00pm, asa 3   ENTEROSCOPY N/A 12/03/2022   Procedure: ENTEROSCOPY;  Surgeon: Dolores Frame, MD;  [facility];  Service: Gastroenterology;  Laterality: N/A;  push enteroscopy   ESOPHAGOGASTRODUODENOSCOPY (EGD) WITH PROPOFOL N/A 10/15/2022   Procedure: ESOPHAGOGASTRODUODENOSCOPY (EGD) WITH PROPOFOL;  Surgeon: Lanelle Bal, DO;  [facility];  Service: Endoscopy;  Laterality: N/A;   GIVENS CAPSULE STUDY N/A 11/14/2022   Procedure: GIVENS CAPSULE STUDY;  Surgeon: Corbin Ade, MD;  [facility];  Service: Endoscopy;  Laterality: N/A;   HEMOSTASIS CLIP PLACEMENT  12/03/2022   Procedure: HEMOSTASIS CLIP PLACEMENT;  Surgeon: Dolores Frame, MD;  [facility];  Service: Gastroenterology;;   HERNIA REPAIR  Bilateral inguinal hernia repair X 4   HOT HEMOSTASIS  12/03/2022   Procedure: HOT HEMOSTASIS (ARGON PLASMA COAGULATION/BICAP);  Surgeon: Marguerita Merles, Reuel Boom, MD;  [facility];  Service: Gastroenterology;;   hydrocelectomy     left knee arthroscopy     LITHOTRIPSY     LYMPHADENECTOMY Bilateral 03/22/2015   Procedure: PELVIC LYMPHADENECTOMY;  Surgeon: Sebastian Ache, MD;  [facility]: [facility];  Service: Urology;  Laterality: Bilateral;   NM MYOVIEW LTD  2011   POLYPECTOMY   09/22/2018   Procedure: POLYPECTOMY;  Surgeon: Franky Macho, MD;  [facility];  Service: Gastroenterology;;   POLYPECTOMY  10/15/2022   Procedure: POLYPECTOMY;  Surgeon: Lanelle Bal, DO;  [facility];  Service: Endoscopy;;   POLYPECTOMY  12/03/2022   Procedure: POLYPECTOMY;  Surgeon: Dolores Frame, MD;  [facility];  Service: Gastroenterology;;   ROBOT ASSISTED LAPAROSCOPIC RADICAL PROSTATECTOMY N/A 03/22/2015   Procedure: ROBOTIC ASSISTED LAPAROSCOPIC RADICAL PROSTATECTOMY WITH INDOCYANINE GREEN DYE INJECTION;  Surgeon: Sebastian Ache, MD;  [facility];  Service: Urology;  Laterality: N/A;   ROBOTIC ASSISTED LAPAROSCOPIC LYSIS OF ADHESION  03/22/2015   Procedure: ROBOTIC ASSISTED LAPAROSCOPIC LYSIS OF ADHESION;  Surgeon: Sebastian Ache, MD;  [facility];  Service: Urology;;   SHOULDER SURGERY Right    SUBMUCOSAL LIFTING INJECTION  12/03/2022   Procedure: SUBMUCOSAL LIFTING INJECTION;  Surgeon: Dolores Frame, MD;  [facility];  Service: Gastroenterology;;   VASECTOMY      Current Outpatient Medications  Medication Sig Dispense Refill   bisoprolol (ZEBETA) 10 MG tablet Take 10 mg by mouth daily.     Dapagliflozin Propanediol (FARXIGA PO) Take by mouth.     docusate sodium (COLACE) 100 MG capsule Take 100 mg by mouth daily.     famotidine (PEPCID) 20 MG tablet Take 20 mg by mouth daily.     furosemide (LASIX) 40 MG tablet Take 1 tablet (40 mg total) by mouth daily as needed (swelling or shortness of breath). 30 tablet 1   glimepiride (AMARYL) 1 MG tablet Take 1 mg by mouth 2 (two) times daily.     HYDROcodone-acetaminophen (NORCO) 10-325 MG tablet Take 1 tablet by mouth 4 (four) times daily as needed for moderate pain (pain score 4-6). Takes 1/2 tablet     ondansetron (ZOFRAN) 4 MG tablet Take 4 mg by mouth every 8 (eight) hours as needed.     OXYGEN Inhale 3 L into the lungs continuous.     pantoprazole  (PROTONIX) 40 MG tablet Take 1 tablet (40 mg total) by mouth daily. 30 tablet 2   valsartan (DIOVAN) 80 MG tablet Take 1 tablet (80 mg total) by mouth daily. 30 tablet 11   Vitamin D, Ergocalciferol, 50000 units CAPS Take 1 capsule by mouth once a week.     ferrous sulfate 325 (65 FE) MG tablet Take 1 tablet (325 mg total) by mouth 3 (three) times daily with meals. 90 tablet 2   rosuvastatin (CRESTOR) 40 MG tablet Take 1 tablet (40 mg total) by mouth daily. (Patient not taking: Reported on 07/28/2023) 90 tablet 3   No current facility-administered medications for this visit.   Allergies:  Lidocaine, Benzocaine, and Other   Social History: The patient  reports that he has quit smoking. His smoking use included cigarettes. He has a 55 pack-year smoking history. He has been exposed to tobacco smoke. He has never used smokeless tobacco. He reports that he does not  currently use alcohol after a past usage of about 1.0 standard drink of alcohol per week. He reports that he does not use drugs.   Family History: The patient's family history includes Heart failure in his father; Stroke in his father.   ROS:  Please see the history of present illness. Otherwise, complete review of systems is positive for none.  All other systems are reviewed and negative.   Physical Exam: VS:  BP 128/80   Pulse 61   Ht 5\' 8"  (1.727 m)   Wt 235 lb 12.8 oz (107 kg)   SpO2 93%   BMI 35.85 kg/m , BMI Body mass index is 35.85 kg/m.  Wt Readings from Last 3 Encounters:  11/10/23 235 lb 12.8 oz (107 kg)  07/28/23 239 lb 8 oz (108.6 kg)  06/27/23 236 lb (107 kg)    General: Patient appears comfortable at rest. HEENT: Conjunctiva and lids normal, oropharynx clear with moist mucosa. Neck: Supple, no elevated JVP or carotid bruits, no thyromegaly. Lungs: Clear to auscultation, nonlabored breathing at rest. Cardiac: Regular rate and rhythm, no S3 or significant systolic murmur, no pericardial rub. Abdomen: Soft,  nontender, no hepatomegaly, bowel sounds present, no guarding or rebound. Extremities: No pitting edema, distal pulses 2+. Skin: Warm and dry. Musculoskeletal: No kyphosis. Neuropsychiatric: Alert and oriented x3, affect grossly appropriate.  Recent Labwork: 11/13/2022: Magnesium 2.3 11/15/2022: B Natriuretic Peptide 183.0; BUN 13; Creatinine, Ser 1.22; Potassium 3.5; Sodium 139 07/31/2023: Hemoglobin 14.5; Platelets 272     Component Value Date/Time   CHOL 208 (H) 05/15/2022 0444   TRIG 268 (H) 05/15/2022 0444   HDL 26 (L) 05/15/2022 0444   CHOLHDL 8.0 05/15/2022 0444   VLDL 54 (H) 05/15/2022 0444   LDLCALC 128 (H) 05/15/2022 0444    Other Studies Reviewed Today: Echocardiogram in 05/2022 LVEF normal  Assessment and Plan:  Dizziness, resolved: Likely secondary to multiple antihypertensive medications.  After stopping amlodipine and Lasix, dizziness resolved completely.  Chronic diastolic heart failure: He had multiple hospitalizations previously for ADHF from diastolic heart failure secondary to chronic blood loss anemia.  No interval hospitalizations.  Compensated.  Takes p.o. Lasix 40 mg as needed.  No need of Jardiance spironolactone at this time.  Echocardiogram from 2023 showed normal LVEF, G1 DD with normal LVEDP.  Elevated coronary calcium score 297 in 2024: Did not tolerate atorvastatin and rosuvastatin due to stomach issues.  Does not want to take any other medications for HLD.  Discussed PCSK9 inhibitors, patient refused.  HTN, controlled: Continue bisoprolol 10 mg once daily and valsartan 80 mg once daily.  Home BPs range between 130 and 140 mmHg SBP.  Controlled blood pressures.  Follow-up with PCP.  OSA not on CPAP: Not on CPAP, follows with PCP, on chronic oxygen therapy.  HLD, hypertriglyceridemia: Did not tolerate atorvastatin and atorvastatin in the past.  Refused PCSK9 inhibitors.  Refused to take any medications for HLD or hypertriglyceridemia.  Chronic iron  deficiency anemia: Follows with PCP.    Medication Adjustments/Labs and Tests Ordered: Current medicines are reviewed at length with the patient today.  Concerns regarding medicines are outlined above.   Tests Ordered: Orders Placed This Encounter  Procedures   EKG 12-Lead    Medication Changes: No orders of the defined types were placed in this encounter.   Disposition:  Follow up PRN  Signed, Xzaviar Maloof Verne Spurr, MD, 11/10/2023 10:28 AM    [facility] at [facility] [address], [place], [place]  [address]  

## 2023-11-12 ENCOUNTER — Telehealth: Payer: MEDICARE

## 2023-11-12 ENCOUNTER — Ambulatory Visit: Payer: MEDICARE

## 2023-11-12 ENCOUNTER — Other Ambulatory Visit: Payer: MEDICARE

## 2023-11-12 DIAGNOSIS — G4733 Obstructive sleep apnea (adult) (pediatric): Secondary | ICD-10-CM

## 2023-11-12 NOTE — Telephone Encounter
Call Back Request      Reason for call back: Patient's assistant Zella Ball is requesting copy of cpap prescription. Stating patient is looking to buy mini cpap online and prescription needs to be uploaded prior to purchasing.     Ph # [phone]    Any Symptoms:  []  Yes  [x]  No      If yes, what symptoms are you experiencing:    Duration of symptoms (how long):    Have you taken medication for symptoms (OTC or Rx):      If call was taken outside of clinic hours:    [] Patient or caller has been notified that this message was sent outside of normal clinic hours.     [] Patient or caller has been warm transferred to the physician's answering service. If applicable, patient or caller informed to please call us back if symptoms progress.  Patient or caller has been notified of the turnaround time of 1-2 business day(s).

## 2023-11-19 ENCOUNTER — Ambulatory Visit (HOSPITAL_COMMUNITY)
Admission: RE | Admit: 2023-11-19 | Discharge: 2023-11-19 | Disposition: A | Discharge status: Home / Self Care | Payer: Medicare Other | Source: Ambulatory Visit | Attending: Acute Care | Admitting: Acute Care

## 2023-11-19 DIAGNOSIS — Z87891 Personal history of nicotine dependence: Secondary | ICD-10-CM | POA: Insufficient documentation

## 2023-11-19 DIAGNOSIS — F17210 Nicotine dependence, cigarettes, uncomplicated: Secondary | ICD-10-CM | POA: Insufficient documentation

## 2023-11-19 DIAGNOSIS — Z122 Encounter for screening for malignant neoplasm of respiratory organs: Secondary | ICD-10-CM | POA: Insufficient documentation

## 2023-11-21 DIAGNOSIS — N189 Chronic kidney disease, unspecified: Secondary | ICD-10-CM | POA: Diagnosis not present

## 2023-11-21 DIAGNOSIS — E1122 Type 2 diabetes mellitus with diabetic chronic kidney disease: Secondary | ICD-10-CM | POA: Diagnosis not present

## 2023-11-21 DIAGNOSIS — N1832 Chronic kidney disease, stage 3b: Secondary | ICD-10-CM | POA: Diagnosis not present

## 2023-11-21 DIAGNOSIS — N2581 Secondary hyperparathyroidism of renal origin: Secondary | ICD-10-CM | POA: Diagnosis not present

## 2023-11-21 DIAGNOSIS — I10 Essential (primary) hypertension: Secondary | ICD-10-CM | POA: Diagnosis not present

## 2023-11-21 DIAGNOSIS — C61 Malignant neoplasm of prostate: Secondary | ICD-10-CM | POA: Diagnosis not present

## 2023-11-21 DIAGNOSIS — D631 Anemia in chronic kidney disease: Secondary | ICD-10-CM | POA: Diagnosis not present

## 2023-11-21 DIAGNOSIS — N1831 Chronic kidney disease, stage 3a: Secondary | ICD-10-CM | POA: Diagnosis not present

## 2023-11-23 NOTE — Progress Notes (Signed)
 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 Had previously eval by Zoila Hines with dx of COPD but never pfts nor need for resp meds though very sedentary due to back pain and ultimately proved to have GOLD 3  COPD     History of Present Illness  08/08/2022  Pulmonary/ 1st office eval/ Anniece Bleiler / [place] [facility]  Chief Complaint  Patient presents with   Consult    Copd exacerbation and pneumonia of LL lobe  Uses 3LO2 cont 24/7   Dyspnea:  back to baseline but 02 at 3 (was 4 lpm)  Cough: none  Sleep: flat  SABA use: has duoneb not using  02 3lpm  Presyncope since d/c  Chronic L > R leg swelling no change  Rec Go to ER:  Admit date:     08/08/2022  Discharge date: 08/09/22   Discharge Diagnoses: Principal Problem:   Symptomatic anemia   Acute respiratory failure with hypoxia (HCC)   Essential hypertension   GERD (gastroesophageal reflux disease)   Iron  deficiency anemia   Brief Hospital Course: Mark Cannon is a 73 y.o. male with medical history significant of arthritis, cancer, GERD, hyperlipidemia, hypertension presents to the ED with a chief complaint of difficulty breathing.  Patient reports that has been gradually in onset over the last 2 weeks.  The dyspnea is exertional.  His oxygen  sats dropped down to 75% while he is exerting himself.  Sitting down situating his oxygen  and trying to rest makes it better.  He reports the lowest oxygen  setting he is seen is 65% that was just prior to his previous hospitalization.  Patient reports no fevers, no cough.  He is wearing 4 L nasal cannula at baseline at home.  He reports he is not using his nebulizer because he ran out of medication for it.  He does not have any chest pains.  He does have some dizziness.  He has had no loss of consciousness.   No change in vision.  He reports he does have a ringing in his ear sometimes.  Patient reports that he did quit smoking last month.  He has orthopnea at home that he reports is resolved but laying on his left or right side.  He reports no peripheral edema.  Patient reports a family history with his mother dying of "low hemoglobin."  According to patient, mother had been anemic for the last couple years of her life and had to have recurrent transfusions and there was never any etiology identified.   Patient does not smoke, does not drink, does not use illicit drugs.  He is vaccinated for COVID.  Patient is full code.   Assessment and Plan: * Symptomatic anemia -Hemoglobin down to 7.6 - Latest hemoglobin about 1 month ago around 11 - Excellent response to 1 unit PRBC transfusion during hospitalization -At discharge hemoglobin 8.9  -No signs of overt bleeding appreciated, patient's symptoms completely improved/resolved. -Case discussed with gastroenterology who recommended outpatient work-up for anemia and endoscopic evaluation if needed.   -Continue PPI and avoid any NSAIDs outside the use of baby enteric-coated aspirin .   -IV iron  x1 also provided while inpatient as anemia panel demonstrated component of iron  deficiency.   Obesity, Class I, BMI 30-34.9 -Body mass  index is 34.86 kg/m. -Low-calorie diet, portion control and increase physical activity discussed with patient.   Iron  deficiency anemia - Anemia panel demonstrating component of iron  deficiency anemia -Endoscopic evaluation GI work-up for anemia to be pursued as an outpatient -Continue PPI -Fecal occult blood test negative -Patient hemodynamically stable.   GERD (gastroesophageal reflux disease) -Continue Protonix  -Lifestyle changes discussed with patient.   Essential hypertension -Resume home antihypertensive regimen. -Heart healthy diet discussed with patient.   Acute respiratory failure with hypoxia (HCC) -On oxygen  at  home since last month -Stable saturation appreciated -Continue 4 L nasal cannula supplementation -Continue as needed bronchodilators -Very likely component of hypoxia unsure when the sensation driven by anemia. -Patient feeling ready to go home currently. -Outpatient follow-up with pulmonologist recommended. -No acute abnormalities appreciated on chest images.     Admit date:     11/13/2022  Discharge date: 11/15/22   Discharge Diagnoses: Principal Problem:   Acute on chronic respiratory failure with hypoxia (HCC)   Acute heart failure with preserved ejection fraction (HFpEF) (HCC)   Prostate cancer (HCC)   Lumbar stenosis with neurogenic claudication   Rectal bleeding   Stroke (cerebrum) (HCC)   COPD with acute exacerbation (HCC)   Essential hypertension   GERD (gastroesophageal reflux disease)   Iron  deficiency anemia   Class 2 obesity   Acute on chronic respiratory failure (HCC)     12/09/2022  post hosp f/u ov/Pyper Olexa re: doe/newly on 02 since pna fall 2023   maint on no resp meds/ never  had pfts  Chief Complaint  Patient presents with   Follow-up    CT scan  Feeling less SOB    Dyspnea:  limited by lower back/  Cough: just  clearing throat / not noticeable while sleeping  Sleeping: bed is level/ one pillow  SABA use: has duoneb not using  02: wearing 4lpm 24/7  Covid status:   JJ  Rec My office will be contacting you by phone for referral to PFT  and overnight oximetry  Make sure you check your oxygen  saturation  AT  your highest level of activity (not after you stop)   to be sure it stays over 90%  Keep some candy handy :   - on 02 3lpm hs with no ex desats  on RA as of  12/09/22     01/21/2023  f/u ov/[place] office/Saphyra Hutt re: GOLD 3 copd  maint on no rx x for noct 02   Chief Complaint  Patient presents with   Follow-up    Breathing has improved. He has occ dry cough, PND.   Dyspnea:  home bound mostly / 75 ft gives out freq with fainty sensation > sob and not  checking sats  Cough: none  Sleeping: level bed one pillow s resp cc  SABA use: none  02: 3lpm at hs   Rec Trelegy 200 one click - take two good drags 1st thing in am - if breathing better then ok on sample then ok to  try Anoro one click each am  - rinse mouth after use     - ONO on 3lpm 02/20/23   < 4 min less than 89%  so no change = 3lpm    06/27/2023  f/u ov/[place] office/Fatisha Rabalais re: GOLD 3/ noct hypoxemia  maint on no rx  (stopped 02 on his own and all inhalers (not working) on ACEi  Chief Complaint  Patient presents with   Follow-up  Dyspnea:  limited by back  Cough: new since last ov harsh dry on lisinorpil  Sleeping: bed is level /one pillow s    resp cc  SABA use: none now  02: not using 02  Rec Stop lisinopril  (it is causing your wheezing in your throat) - also try to avoid clearing your throat (jolley ranchers) Start valsartan  80 mg one daily in place of lisinopril  and let your PCP know when you return  Only use your albuterol  as a rescue medication  Also  Ok to try albuterol  15 min before an activity (on alternating days)  that you know would usually make you short of breath Please schedule a follow up visit in 3 months but call sooner if needed     11/24/2023  f/u ov/[place] office/Larinda Herter re: GOLD 3 copd /noct hypoxema/? Acei case maint on nothing including 02   Chief Complaint  Patient presents with   Shortness of Breath  Dyspnea:  low back limiting  Cough: none  Sleeping: level bed/ one pillow s  resp cc  SABA use: none  02: none   Lung cancer screening: 11/19/23 result still pending    No obvious day to day or daytime variability or assoc excess/ purulent sputum or mucus plugs or hemoptysis or cp or chest tightness, subjective wheeze or overt sinus or hb symptoms.    Also denies any obvious fluctuation of symptoms with weather or environmental changes or other aggravating or alleviating factors except as outlined above   No unusual exposure hx or h/o  childhood pna/ asthma or knowledge of premature birth.  Current Allergies, Complete Past Medical History, Past Surgical History, Family History, and Social History were reviewed in [employer] record.  ROS  The following are not active complaints unless bolded Hoarseness, sore throat, dysphagia, dental problems, itching, sneezing,  nasal congestion or discharge of excess mucus or purulent secretions, ear ache,   fever, chills, sweats, unintended wt loss or wt gain, classically pleuritic or exertional cp,  orthopnea pnd or arm/hand swelling  or leg swelling, presyncope, palpitations, abdominal pain, anorexia, nausea, vomiting, diarrhea  or change in bowel habits or change in bladder habits, change in stools or change in urine, dysuria, hematuria,  rash, arthralgias, visual complaints, headache, numbness, weakness or ataxia or problems with walking or coordination,  change in mood or  memory.        Current Meds  Medication Sig   bisoprolol  (ZEBETA ) 10 MG tablet Take 10 mg by mouth daily.   Dapagliflozin Propanediol (FARXIGA PO) Take by mouth.   docusate sodium  (COLACE) 100 MG capsule Take 100 mg by mouth daily.   famotidine  (PEPCID ) 20 MG tablet Take 20 mg by mouth daily.   furosemide  (LASIX ) 40 MG tablet Take 1 tablet (40 mg total) by mouth daily as needed (swelling or shortness of breath).   glimepiride  (AMARYL ) 1 MG tablet Take 1 mg by mouth 2 (two) times daily.   HYDROcodone -acetaminophen  (NORCO) 10-325 MG tablet Take 1 tablet by mouth 4 (four) times daily as needed for moderate pain (pain score 4-6). Takes 1/2 tablet   ondansetron  (ZOFRAN ) 4 MG tablet Take 4 mg by mouth every 8 (eight) hours as needed.   OXYGEN  Inhale 3 L into the lungs continuous.   pantoprazole  (PROTONIX ) 40 MG tablet Take 1 tablet (40 mg total) by mouth daily.   valsartan  (DIOVAN ) 80 MG tablet Take 1 tablet (80 mg total) by mouth daily.   Vitamin D , Ergocalciferol , 50000 units CAPS Take 1 capsule by  mouth  once a week.                  Past Medical History:  Diagnosis Date   Arthritis    Cancer [facility])    Prostate, has had prostate removed, has had radiation and still has cancer,   Colon polyps    Cough    GERD (gastroesophageal reflux disease)    occasional    H/O tooth extraction week of 03/15/2015    History of kidney stones    Hypercholesteremia    Hypertension    Lumbar stenosis with neurogenic claudication    Pneumonia    Pre-diabetes    Renal disorder    Sleep apnea    No cpap       Objective:    Wts  11/24/2023       240  06/27/2023       236  01/21/2023         227   12/09/22 228 lb (103.4 kg)  12/03/22 225 lb (102.1 kg)  11/29/22 218 lb 11.1 oz (99.2 kg)    Vital signs reviewed  11/24/2023  - Note at rest 02 sats  90% on RA   General appearance:    amb wf nad   HEENT : Oropharynx  clear   Nasal turbinates nl    NECK :  without  apparent JVD/ palpable Nodes/TM    LUNGS: no acc muscle use,  Mild barrel  contour chest wall with bilateral  Distant bs s audible wheeze and  without cough on insp or exp maneuvers  and mild  Hyperresonant  to  percussion bilaterally     CV:  RRR  no s3 or murmur or increase in P2, and no edema   ABD:  quite obese/ soft and nontender with pos end  insp Hoover's  in the supine position.  No bruits or organomegaly appreciated   MS:  Nl gait/ ext warm without deformities Or obvious joint restrictions  calf tenderness, cyanosis or clubbing     SKIN: warm and dry without lesions    NEURO:  alert, approp, nl sensorium with  no motor or cerebellar deficits apparent.            Assessment

## 2023-11-24 ENCOUNTER — Ambulatory Visit (INDEPENDENT_AMBULATORY_CARE_PROVIDER_SITE_OTHER): Payer: Medicare Other | Admitting: Internal Medicine

## 2023-11-24 ENCOUNTER — Telehealth: Payer: Self-pay | Admitting: Internal Medicine

## 2023-11-24 ENCOUNTER — Encounter: Payer: Self-pay | Admitting: Internal Medicine

## 2023-11-24 VITALS — BP 127/57 | HR 67 | Ht 68.0 in | Wt 240.0 lb

## 2023-11-24 DIAGNOSIS — G4734 Idiopathic sleep related nonobstructive alveolar hypoventilation: Secondary | ICD-10-CM | POA: Diagnosis not present

## 2023-11-24 DIAGNOSIS — I10 Essential (primary) hypertension: Secondary | ICD-10-CM

## 2023-11-24 DIAGNOSIS — J449 Chronic obstructive pulmonary disease, unspecified: Secondary | ICD-10-CM

## 2023-11-24 NOTE — Addendum Note (Signed)
 Addended by: Alyse Bach on: 11/24/2023 01:58 PM   Modules accepted: Orders

## 2023-11-24 NOTE — Assessment & Plan Note (Signed)
 D/c acei 06/27/2023 due to harsh cough / pseudowheeze> resolved   Although even in retrospect it may not be clear the ACEi contributed to the pt's symptoms,  Pt improved off them and adding them back at this point or in the future would risk confusion in interpretation of non-specific respiratory symptoms to which this patient is prone  ie  Better not to muddy the waters here.   >>>  diovan  80 mg one daily   F/u PCP  Pulmonary f/u is prn         Each maintenance medication was reviewed in detail including emphasizing most importantly the difference between maintenance and prns and under what circumstances the prns are to be triggered using an action plan format where appropriate.  Total time for H and P, chart review, counseling, reviewing hfa device(s) and generating customized AVS unique to this office visit / same day charting = 25 min

## 2023-11-24 NOTE — Assessment & Plan Note (Signed)
 D/c on 02  on 11/15/22 - on 02 3lpm hs with no ex desats  on RA as of  12/09/22  - ONO on 3lpm 02/20/23   < 4 min less than 89%  so no change 3lpm - - 11/24/2023 d/c'd all 02 at pt's request

## 2023-11-24 NOTE — Assessment & Plan Note (Signed)
 Quit smoking Sept 14 2023  -  Onset of symptoms  2023  -  Echo 05/15/22 grad 1 diastolic dysfunction  - B R >> L pleural effusions:  R Tap 11/13/22  900 cc transudate in setting of severe anemia attributed to polyps > removed at admit  - 12/09/2022   Walked on ra   approx 75  ft  @ slow  pace, stopped due to feeling fainty with lowest 02 sats 96%  - PFT's  01/14/23  FEV1 1.34 (45 % ) ratio 0.54  p 0 % improvement from saba p 0 prior to study with DLCO  13.32 (55%)   and FV curve classically concave  and ERV 46%   at wt 230   - 01/21/2023  After extensive coaching inhaler device,  effectiveness =    75% (short ti with dpi > try telegy 200 sample then anoro 1 click each am and f/u prn   - 06/27/2023 d/c'd all inhalers "they don't work" with harsh cough on lisinopril  and prominent pseudowheeze on exam > resolved off ACEi   He is feeling great but suspect he still has some noct desats more related to obesity than copd but he flatly refuses to consider going back on 02 or having additonal ONO on RA so will have the equipment picked up and see him back as needed.

## 2023-11-24 NOTE — Telephone Encounter (Signed)
 Order to d/c O2 placed during OV today. PCCs to send to [facility]. They will contact patient to arrange pick up of equipment.

## 2023-11-24 NOTE — Telephone Encounter (Signed)
 Patient states needs order for oxygen  to be picked up. [facility] fax number is [phone]. Patient phone number is [phone].

## 2023-11-24 NOTE — Patient Instructions (Signed)
 Ok to stop all oxygen    We will call you when we have a report on your CT chest   Follow up in this clinic is as needed

## 2023-11-27 DIAGNOSIS — N189 Chronic kidney disease, unspecified: Secondary | ICD-10-CM | POA: Diagnosis not present

## 2023-11-27 DIAGNOSIS — E1122 Type 2 diabetes mellitus with diabetic chronic kidney disease: Secondary | ICD-10-CM | POA: Diagnosis not present

## 2023-11-27 DIAGNOSIS — I10 Essential (primary) hypertension: Secondary | ICD-10-CM | POA: Diagnosis not present

## 2023-11-27 DIAGNOSIS — R809 Proteinuria, unspecified: Secondary | ICD-10-CM | POA: Diagnosis not present

## 2023-11-27 DIAGNOSIS — N2581 Secondary hyperparathyroidism of renal origin: Secondary | ICD-10-CM | POA: Diagnosis not present

## 2023-11-27 DIAGNOSIS — N1831 Chronic kidney disease, stage 3a: Secondary | ICD-10-CM | POA: Diagnosis not present

## 2023-11-27 DIAGNOSIS — E1129 Type 2 diabetes mellitus with other diabetic kidney complication: Secondary | ICD-10-CM | POA: Diagnosis not present

## 2023-11-28 ENCOUNTER — Other Ambulatory Visit: Payer: Self-pay | Admitting: Acute Care

## 2023-11-28 DIAGNOSIS — Z87891 Personal history of nicotine dependence: Secondary | ICD-10-CM

## 2023-11-28 DIAGNOSIS — Z122 Encounter for screening for malignant neoplasm of respiratory organs: Secondary | ICD-10-CM

## 2023-12-04 ENCOUNTER — Ambulatory Visit: Payer: BLUE CROSS/BLUE SHIELD

## 2023-12-04 DIAGNOSIS — C775 Secondary and unspecified malignant neoplasm of intrapelvic lymph nodes: Secondary | ICD-10-CM | POA: Diagnosis not present

## 2023-12-04 DIAGNOSIS — N393 Stress incontinence (female) (male): Secondary | ICD-10-CM | POA: Diagnosis not present

## 2023-12-04 DIAGNOSIS — C61 Malignant neoplasm of prostate: Secondary | ICD-10-CM | POA: Diagnosis not present

## 2023-12-04 DIAGNOSIS — J4550 Severe persistent asthma, uncomplicated: Secondary | ICD-10-CM

## 2023-12-04 DIAGNOSIS — J4489 Asthma with chronic obstructive pulmonary disease (COPD) (HCC/RAF): Secondary | ICD-10-CM

## 2023-12-04 DIAGNOSIS — G4733 Obstructive sleep apnea (adult) (pediatric): Secondary | ICD-10-CM

## 2023-12-04 NOTE — Progress Notes
 PULMONARY/SLEEP MEDICINE NOTE    PCP: Babs Sciara, MD      CC: OSA and asthma fu     DIAGNOSTIC SLEEP TEST:  In 2006    CARDIODIAGNOSTIC EXAM(s):  N/A      INTERVAL HISTORY:  Maurice Ramirez is a 73 y.o. male with  moderate persistent asthma, intermittent episodes of allergic bronchopulmonary aspergillosis , h/o LLL nodule (bx in 2012 benign), OSA on PAP, and h/o afib s/p cardioversion for fu OSA     06/05/23  - Just came back from 12 day vacation in [place] and did not take CPAP with him and took oral appliance instead and was able to minimize snoring. Oral appliance has not been changed in 15 years. CPAP is dreamstation. 30 day AHI 2.1, Limited data due to not using during recent travels. On average, 4 days a week wakes up without it.   - sleeps through the night without issues  - has not received replacement device from [facility] or new device   - ESS 7. (3 for lying down to rest. Enters meditative state when laying down for energy preservation.)  - Asthma: well-controlled. Uses advair regularly, albuterol about 1-2x/week.    Interim history:  Has not had a drink for 6 years. Using PAP nightly and 1/4 of time wakes up and mask is off his face.   Had no episode of asthma exacerbation since last visit. Has not gone to hospital for >3 years.   Exercising daily, walks 45 min a day, [employer] daily    LCV: 7/23: Doing well. 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 Does the [employer], bands, dumbbells - 1.5-2 hours work out then walks and [employer]. Gets enough sleep. Stopped alcohol.    He still reports using his device every night, still wakes up with the mask off and does not recall why, but this is not every night, and on the whole, he denies fatigue, daytime sleepiness, snoring and morning headaches. Uses MAD when he travels.  No asthma exacerbations during this time. Denies shortness of breath, wheezing, chest tightness and dizziness. Compliant on his inhalers. Able to run/jog every few days. Does Bruce yearly and does better every year.    11/01/21: Patient returns after 2 years away from clinic. He has been doing quite well. He still reports using his device every night, still wakes up with the mask off and does not recall why, but this is not every night, and on the whole, he denies fatigue, daytime sleepiness, snoring and morning headaches. Uses MAD when he travels. He is exercising daily (brisk walk/run outside). He increases his exercise tolerance every year. No asthma exacerbations during this time. Denies shortness of breath, wheezing, chest tightness and dizziness. Compliant on his inhalers.     12/26/22  Was supposed to have pfts today but had a recent oral implant and was told that needs to wait 3 months.  Brought in refurbished dreamstation that he received but did not bring cord so I cannot download data. Filter dirty and we reviewed that he needs to change the blue filter.   Last device distributed 5 years ago.  Got labs done today.  Overall doing well.    05/2023:  Asthma meds and hx:  Singulair 10mg  daily, advair 500/50 BID and albuterol (1x/week)  Last hospital visit for asthma was many years ago, intubated about >46yrs ago for a severe attack  Last use of prednisone dose for asthma (  prior to sinus infections) was several years ago  Current bedtime 10 PM, falls asleep within few min  with PAP, wakes up at 5 am  Patient is tolerating PAP therapy: Yes  On PAP therapy, patient reports improvement in: sleep quality, daytime energy, alertness, and No improvement  Abdominal bloating, nausea or eructation associated with PAP therapy: No  Snoring with PAP: No  Sleepiness with driving or motor vehicle collisions due to drowsy driving: No  The patient is using a standard nasal interface.  Patient is routinely getting replacement supplies: Yes   The patient is willing to continue with PAP therapy     12/04/23:  - States things with his current PAP device (Airsense 11) have been amazing  - Says that nasal mask stays on his face more consistently lately  - Denies any HA, dry mouth, or bloating in AM  - Uses a chin strap and denies any mouth leak  - Denies any issues with driving and sleepiness  - ESS: 6  - Current bedtime is unchanged from last visit  - Has had a cold for a few days; states asthma symptoms have been well controlled otherwise, but using albuterol once daily for the time being  - Tries to exercise and jog regularly for ~15 min a day, then walks for another ~15 min  - Now has a travel PAP so uses one of his PAP devices nightly now    Social History     Tobacco Use    Smoking status: Never    Smokeless tobacco: Never       No family history on file.    Allergies   Allergen Reactions    Chocolate     Pollen Extract     Wheat        No outpatient medications have been marked as taking for the 12/04/23 encounter (Appointment) with Lazaro Arms., MD.       ROS: pertinent review of systems as detailed in the HPI. The rest of the complete review of systems is negative except as detailed in the written health and history questionnaire.      PHYSICAL EXAM:  There were no vitals taken for this visit.  General: Well-developed male, in no acute distress.   HEENT: Normocephalic and atraumatic.   CV: RRR, no r/g/m  Resp: Mild end expiratory wheeze in bases, normal WOB, speaking in full sentences, no cough  Extremities: none lower extremity edema. No clubbing or cyanosis.  Neuro: Alert, oriented and appropriately interactive. No focal deficits. Speech fluent.   Psychological: normal affect      COMPLIANCE DATA:      Labs  IgE  2375 (05/2023)  CBC with eos normal (05/2023)    Imaging:  CT chest 2012  Unchanged biapical fibronodular pleural parenchymal scarring and calcified subcentimeter nodule within the right middle lobe (2-51), consistent with prior granulomatous disease.   Persistent diffuse peribronchial and peribronchiolar thickening with associated airway irregularity and occasional ectasia, consistent with chronic asthma/bronchitis, with scattered bronchocentric bands and architectural distortion within both upper, right middle and left   greater than right lower lobes, consistent with postinflammatory pleural parenchymal scarring.  Near-complete resolution of mass-like consolidation within the left lower lobe with residual architectural distortion (2-56). Several fluctuating subcentimeter nodules within the left lower lobe and resolved within the right lower lobe since 05/28/2011, likely airway-related inflammatory and mucus impaction.     Pathology:  LLL nodule 05/2011  LUNG NODULE, LEFT LOWER LOBE (BIOPSY):  - Acute  and organizing pneumonia (see Comment)  - Prominent chronic inflammation in interstitium  - Gram stain is negative for bacterial organisms  - GMS stain is negative for fungal organisms  - AFB stain is negative for acid-fast bacilli  - No neoplasms identified    PFT 8/24    PFT 12/2022  FVC  (preBD) 3.62/96.9% (postBD) 4.05/110%   FEV1 (preBD 1.47/53.2% (postBD) 1.65/60%  Has positive BD response     PFT 12/2019  FVC  (preBD) 3.60/90% (postBD) 4.26/108%   FEV1 (preBD 1.22/40% (postBD) 1.60/52%  Has positive BD response         PFT 07/2018  FVC 3.95 (94.8%),  FEV1  1.51 (48.7%)  DLCO 28.4 (100%)     PFT 09/2014:  FVC 4.04 (93.7%), post 4.436 (103.6%)  FEV1  1.29 (39.8%), post 1.67 (51.6%)  DLCO 100.7%     PFT 08/2010  FVC 4.80 (109%), post 4.99 (114%)  FEV 1.85 (55%), post 2.01 (60%)        ASSESSMENT:  73 y.o. man with  moderate persistent asthma (FEV1 40.1%), intermittent episodes of allergic bronchopulmonary aspergillosis , h/o LLL nodule (bx in 2012 benign) and OSA on PAP presents for follow up. Has been tolerating PAP with good adherence and motivated to continue use. Addition of Airsense Mini to patient's [employer] profiles seems to have removed patient's other primary PAP device. Will attempt to reconnect.    From an asthma standpoint, symptoms have largely been controlled with some increased need for albuterol during recent URI. Labs with persistent IgE elevation. Will pursue additional serologic w/u, repeat CT chest wo con, and referral to Allergy and Immunology.     Obstructive Sleep Apnea:  Continue with PAP therapy, the patient has acclimated well and is experiencing symptomatic improvement.  The patient is willing to continue with PAP therapy.  Will attempt to reconnect patients primary PAP device (Airsense 11; device number [id number]) to our [employer] account for continued remote data monitoring  Encouraged to use Airsense Mini with travel  Patient getting regular PAP supplies       Asthma  Well controlled. Patient feeling well overall. IgE elevated but eos normal. PFTs stable. History of intermittent episodes of ABPA.   - Obtain strongyloides Ab, Aspergillus IgE, and refer to Allergy & Immunology for evaluation given persistent IgE elevation  - Obtain repeat CT chest wo con     I discussed the plan in detail with the patient who voiced understanding and is in agreement.    >~ 25 minutes was spent on this visit.    Follow up: ~6 months, sooner if needed.    DWA Dr. Fabio Asa, MD  Pulmonary, Sleep, & Critical Care Fellow    I saw and evaluated [name].  I discussed the case with the fellow and agree with the findings and plan of care as documented in the fellow's note along with my additions and/or corrections.     Lazaro Arms

## 2023-12-04 NOTE — Patient Instructions
-   Please go to the lab to get updated blood work. We ordered for a CBC, IgE, Strongyloides Ab, and Aspergillus Ab.  - We also ordered a CT chest to be done sometime within the next couple of months.

## 2023-12-10 ENCOUNTER — Telehealth: Payer: BLUE CROSS/BLUE SHIELD

## 2023-12-10 NOTE — Telephone Encounter
 LVM to offer appt

## 2023-12-15 ENCOUNTER — Ambulatory Visit
Admission: EM | Admit: 2023-12-15 | Discharge: 2023-12-15 | Disposition: A | Discharge status: Home / Self Care | Attending: Nurse Practitioner | Admitting: Nurse Practitioner

## 2023-12-15 DIAGNOSIS — L03011 Cellulitis of right finger: Secondary | ICD-10-CM | POA: Diagnosis not present

## 2023-12-15 DIAGNOSIS — W540XXA Bitten by dog, initial encounter: Secondary | ICD-10-CM

## 2023-12-15 MED ORDER — AMOXICILLIN-POT CLAVULANATE 875-125 MG PO TABS: 1.0000 | ORAL_TABLET | Freq: Two times a day (BID) | ORAL | 0 refills | Status: AC

## 2023-12-15 NOTE — ED Triage Notes (Signed)
 Pt states he was bit on Saturday by wife's dog. Pt states the dog is up to date on rabies and all shots.  Pt has bite marks on right thumb, fist, second and third fingers with slight swelling and pt states it is painful to move his fingers.

## 2023-12-15 NOTE — ED Provider Notes (Signed)
 [facility]    CSN: [id number] Arrival date & time: 12/15/23  1350      History   Chief Complaint Chief Complaint  Patient presents with   Animal Bite    HPI Mark Cannon is a 73 y.o. male.   Patient presents today with concern for infection after he was bit by a dog over the weekend.  Reports the dog bit him on numerous fingers and they are now swollen and painful to bend.  He denies numbness or tingling in the tip of his fingers.  Reports there is some drainage coming from his right index finger.  He has been cleaning the wounds with peroxide and apply Neosporin and a bandage.  He denies fevers or nausea/vomiting.  Does not know when his last tetanus shot was and declines update tetanus shot today.  He tried to see primary care provider earlier today and was unable to be seen by them.    Past Medical History:  Diagnosis Date   Arthritis    Cancer [facility])    Prostate, has had prostate removed, has had radiation and still has cancer,   Chronic respiratory failure (HCC)    Colon polyps    Cough    Diabetes mellitus without complication (HCC)    Dyspnea    GERD (gastroesophageal reflux disease)    occasional    H/O tooth extraction week of 03/15/2015    History of kidney stones    Hypercholesteremia    Hypertension    Lumbar stenosis with neurogenic claudication    Pneumonia    Pre-diabetes    Renal disorder    Sleep apnea    No cpap    Patient Active Problem List   Diagnosis Date Noted   Chronic kidney disease, stage 3a (HCC) 05/15/2023   Hyperparathyroidism due to renal insufficiency (HCC) 05/15/2023   Elevated coronary artery calcium score 05/02/2023   Hypertriglyceridemia 02/10/2023   OSA (obstructive sleep apnea) 02/10/2023   Screening for cardiovascular condition 02/10/2023   Chronic diastolic heart failure (HCC) 02/10/2023   Dizziness 02/10/2023   Diabetic nephropathy (HCC) 02/10/2023   DM (diabetes mellitus), type 2 with renal complications  (HCC) 02/10/2023   Nocturnal hypoxemia 01/21/2023   AVM (arteriovenous malformation) of small bowel, acquired 01/16/2023   Acute on chronic respiratory failure (HCC) 11/14/2022   Acute heart failure with preserved ejection fraction (HFpEF) (HCC) 10/14/2022   Acute on chronic respiratory failure with hypoxia (HCC) 10/13/2022   Chronic respiratory failure with hypoxia (HCC) 10/12/2022   Essential hypertension 08/09/2022   GERD (gastroesophageal reflux disease) 08/09/2022   Class 2 obesity 08/09/2022   Iron deficiency anemia    COPD  GOLD 3 08/08/2022   Anemia in chronic kidney disease 08/08/2022   Pneumonia 06/27/2022   COPD with acute exacerbation (HCC) 06/27/2022   Stroke (cerebrum) (HCC) 05/14/2022   History of colonic polyps    Diverticulosis of large intestine without diverticulitis    Lumbar stenosis with neurogenic claudication 06/01/2018   Prostate cancer (HCC) 03/22/2015    Past Surgical History:  Procedure Laterality Date   APPENDECTOMY     BACK SURGERY     BIOPSY  12/03/2022   Procedure: BIOPSY;  Surgeon: Dolores Frame, MD;  Location: [facility];  Service: Gastroenterology;;   CHOLECYSTECTOMY     COLONOSCOPY  02/04/2012   Procedure: COLONOSCOPY;  Surgeon: Dalia Heading, MD;  Location: [facility];  Service: Gastroenterology;  Laterality: N/A;   COLONOSCOPY N/A 09/22/2018  Procedure: COLONOSCOPY;  Surgeon: Franky Macho, MD;  Location: [facility];  Service: Gastroenterology;  Laterality: N/A;   COLONOSCOPY W/ POLYPECTOMY     COLONOSCOPY WITH PROPOFOL N/A 10/15/2022   Procedure: COLONOSCOPY WITH PROPOFOL;  Surgeon: Lanelle Bal, DO;  Location: [facility];  Service: Endoscopy;  Laterality: N/A;   COLONOSCOPY WITH PROPOFOL N/A 12/03/2022   Procedure: COLONOSCOPY WITH PROPOFOL;  Surgeon: Dolores Frame, MD;  [facility]: [facility];  Service: Gastroenterology;  Laterality: N/A;  12:00pm, asa 3   ENTEROSCOPY N/A 12/03/2022   Procedure:  ENTEROSCOPY;  Surgeon: Dolores Frame, MD;  [facility]: [facility];  Service: Gastroenterology;  Laterality: N/A;  push enteroscopy   ESOPHAGOGASTRODUODENOSCOPY (EGD) WITH PROPOFOL N/A 10/15/2022   Procedure: ESOPHAGOGASTRODUODENOSCOPY (EGD) WITH PROPOFOL;  Surgeon: Lanelle Bal, DO;  [facility]: [facility];  Service: Endoscopy;  Laterality: N/A;   GIVENS CAPSULE STUDY N/A 11/14/2022   Procedure: GIVENS CAPSULE STUDY;  Surgeon: Corbin Ade, MD;  [facility]: [facility];  Service: Endoscopy;  Laterality: N/A;   HEMOSTASIS CLIP PLACEMENT  12/03/2022   Procedure: HEMOSTASIS CLIP PLACEMENT;  Surgeon: Dolores Frame, MD;  [facility]: [facility];  Service: Gastroenterology;;   HERNIA REPAIR     Bilateral inguinal hernia repair X 4   HOT HEMOSTASIS  12/03/2022   Procedure: HOT HEMOSTASIS (ARGON PLASMA COAGULATION/BICAP);  Surgeon: Marguerita Merles, Reuel Boom, MD;  [facility]: [facility];  Service: Gastroenterology;;   hydrocelectomy     left knee arthroscopy     LITHOTRIPSY     LYMPHADENECTOMY Bilateral 03/22/2015   Procedure: PELVIC LYMPHADENECTOMY;  Surgeon: Sebastian Ache, MD;  Location: [facility];  Service: Urology;  Laterality: Bilateral;   NM MYOVIEW LTD  2011   POLYPECTOMY  09/22/2018   Procedure: POLYPECTOMY;  Surgeon: Franky Macho, MD;  [facility];  Service: Gastroenterology;;   POLYPECTOMY  10/15/2022   Procedure: POLYPECTOMY;  Surgeon: Lanelle Bal, DO;  [facility];  Service: Endoscopy;;   POLYPECTOMY  12/03/2022   Procedure: POLYPECTOMY;  Surgeon: Dolores Frame, MD;  [facility];  Service: Gastroenterology;;   ROBOT ASSISTED LAPAROSCOPIC RADICAL PROSTATECTOMY N/A 03/22/2015   Procedure: ROBOTIC ASSISTED LAPAROSCOPIC RADICAL PROSTATECTOMY WITH INDOCYANINE GREEN DYE INJECTION;  Surgeon: Sebastian Ache, MD;  Location: [facility];  Service: Urology;  Laterality: N/A;   ROBOTIC ASSISTED LAPAROSCOPIC LYSIS OF ADHESION   03/22/2015   Procedure: ROBOTIC ASSISTED LAPAROSCOPIC LYSIS OF ADHESION;  Surgeon: Sebastian Ache, MD;  Location: [facility];  Service: Urology;;   SHOULDER SURGERY Right    SUBMUCOSAL LIFTING INJECTION  12/03/2022   Procedure: SUBMUCOSAL LIFTING INJECTION;  Surgeon: Dolores Frame, MD;  [facility]: [facility];  Service: Gastroenterology;;   VASECTOMY         Home Medications    Prior to Admission medications   Medication Sig Start Date End Date Taking? Authorizing Provider  amoxicillin-clavulanate (AUGMENTIN) 875-125 MG tablet Take 1 tablet by mouth 2 (two) times daily for 7 days. 12/15/23 12/22/23 Yes Valentino Nose, NP  Dapagliflozin Propanediol (FARXIGA PO) Take by mouth.   Yes [provider]  famotidine (PEPCID) 20 MG tablet Take 20 mg by mouth daily. 10/01/23  Yes [provider]  furosemide (LASIX) 40 MG tablet Take 1 tablet (40 mg total) by mouth daily as needed (swelling or shortness of breath). 05/02/23  Yes Mallipeddi, Vishnu P, MD  glimepiride (AMARYL) 1 MG tablet Take 1 mg by mouth 2 (two) times  daily. 09/18/22  Yes [provider]  HYDROcodone-acetaminophen (NORCO) 10-325 MG tablet Take 1 tablet by mouth 4 (four) times daily as needed for moderate pain (pain score 4-6). Takes 1/2 tablet 10/25/22  Yes [provider]  Vitamin D, Ergocalciferol, 50000 units CAPS Take 1 capsule by mouth once a week. 06/04/23  Yes [provider]  bisoprolol (ZEBETA) 10 MG tablet Take 10 mg by mouth daily.    [provider]  docusate sodium (COLACE) 100 MG capsule Take 100 mg by mouth daily.    [provider]  ferrous sulfate 325 (65 FE) MG tablet Take 1 tablet (325 mg total) by mouth 3 (three) times daily with meals. 11/15/22 07/28/23  Shahmehdi, Gemma Payor, MD  ondansetron (ZOFRAN) 4 MG tablet Take 4 mg by mouth every 8 (eight) hours as needed. 10/01/23   [provider]  OXYGEN Inhale 3 L into the lungs continuous.     [provider]  pantoprazole (PROTONIX) 40 MG tablet Take 1 tablet (40 mg total) by mouth daily. 08/09/22   Vassie Loll, MD  rosuvastatin (CRESTOR) 40 MG tablet Take 1 tablet (40 mg total) by mouth daily. Patient not taking: Reported on 07/28/2023 02/10/23 07/28/23  Mallipeddi, Vishnu P, MD  valsartan (DIOVAN) 80 MG tablet Take 1 tablet (80 mg total) by mouth daily. 06/27/23   Nyoka Cowden, MD    Family History Family History  Problem Relation Age of Onset   Heart failure Father    Stroke Father    Colon cancer Neg Hx     Social History Social History   Tobacco Use   Smoking status: Former    Current packs/day: 1.00    Average packs/day: 1 pack/day for 55.0 years (55.0 ttl pk-yrs)    Types: Cigarettes    Passive exposure: Past   Smokeless tobacco: Never  Vaping Use   Vaping status: Never Used  Substance Use Topics   Alcohol use: Not Currently    Alcohol/week: 1.0 standard drink of alcohol    Types: 1 Glasses of wine per week    Comment: one glass of wine once a month   Drug use: No     Allergies   Lidocaine, Benzocaine, and Other   Review of Systems Review of Systems Per HPI  Physical Exam Triage Vital Signs ED Triage Vitals [12/15/23 1556]  Encounter Vitals Group     BP 120/71     Systolic BP Percentile      Diastolic BP Percentile      Pulse Rate 63     Resp 20     Temp 98.6 F (37 C)     Temp Source Oral     SpO2 92 %     Weight      Height      Head Circumference      Peak Flow      Pain Score 9     Pain Loc      Pain Education      Exclude from Growth Chart    No data found.  Updated Vital Signs BP 120/71 (BP Location: Right Arm)   Pulse 63   Temp 98.6 F (37 C) (Oral)   Resp 20   SpO2 92%   Visual Acuity Right Eye Distance:   Left Eye Distance:   Bilateral Distance:    Right Eye Near:   Left Eye Near:    Bilateral Near:     Physical Exam Vitals and nursing note reviewed.  Constitutional:      General: He is  not in acute distress.    Appearance: Normal appearance. He is not toxic-appearing.  Pulmonary:     Effort: Pulmonary effort is normal. No respiratory distress.  Musculoskeletal:     Comments: Inspection: Mild swelling and erythema noted to right second digit with purulent drainage noted to the open wound; no discoloration distal to the wound and no obvious deformity.  There are multiple abrasions noted to first third and fourth digits as well without surrounding erythema, obvious swelling, or drainage. Palpation: tender to palpation right first digit around wound as well as right second digit around wound; no obvious deformities palpated ROM: Difficult to assess secondary to pain and swelling Strength: 5/5 right upper extremity Neurovascular: neurovascularly intact in distal right upper extremity; distal to the wounds  Skin:    General: Skin is warm and dry.     Capillary Refill: Capillary refill takes less than 2 seconds.     Coloration: Skin is not jaundiced or pale.     Findings: No erythema.  Neurological:     Mental Status: He is alert and oriented to person, place, and time.  Psychiatric:        Behavior: Behavior is cooperative.      UC Treatments / Results  Labs (all labs ordered are listed, but only abnormal results are displayed) Labs Reviewed - No data to display  EKG   Radiology No results found.  Procedures Procedures (including critical care time)  Medications Ordered in UC Medications - No data to display  Initial Impression / Assessment and Plan / UC Course  I have reviewed the triage vital signs and the nursing notes.  Pertinent labs & imaging results that were available during my care of the patient were reviewed by me and considered in my medical decision making (see chart for details).   Patient is well-appearing, normotensive, afebrile, not tachycardic, not tachypneic, oxygenating well on room air.    1. Dog bite, initial encounter 2. Cellulitis  of right index finger Wound care discussed-start mild soap and water and avoid future use of peroxide and open wounds Start Augmentin twice daily for 7 days; recent kidney function used to calculate creatinine clearance is greater than 65 so no dosage adjustment needed Patient declines Tdap today although I did recommend this to him Return and ER precautions discussed with patient  The patient was given the opportunity to ask questions.  All questions answered to their satisfaction.  The patient is in agreement to this plan.   Final Clinical Impressions(s) / UC Diagnoses   Final diagnoses:  Dog bite, initial encounter  Cellulitis of right index finger     Discharge Instructions      Take the Augmentin twice daily for 7 days to treat infection.  Start cleaning the wound twice daily with soap and water.  Keep if covered with a bandage if it is draining.  Seek care if symptoms do not improve with treatment.     ED Prescriptions     Medication Sig Dispense Auth. Provider   amoxicillin-clavulanate (AUGMENTIN) 875-125 MG tablet Take 1 tablet by mouth 2 (two) times daily for 7 days. 14 tablet Valentino Nose, NP      PDMP not reviewed this encounter.   Valentino Nose, NP 12/15/23 [phone]

## 2023-12-15 NOTE — Discharge Instructions (Addendum)
 Take the Augmentin twice daily for 7 days to treat infection.  Start cleaning the wound twice daily with soap and water.  Keep if covered with a bandage if it is draining.  Seek care if symptoms do not improve with treatment.

## 2023-12-16 ENCOUNTER — Ambulatory Visit: Payer: BLUE CROSS/BLUE SHIELD

## 2024-01-06 DIAGNOSIS — C61 Malignant neoplasm of prostate: Secondary | ICD-10-CM | POA: Diagnosis not present

## 2024-01-27 ENCOUNTER — Other Ambulatory Visit (HOSPITAL_COMMUNITY): Payer: Self-pay | Admitting: Urology

## 2024-01-27 DIAGNOSIS — C61 Malignant neoplasm of prostate: Secondary | ICD-10-CM

## 2024-01-28 ENCOUNTER — Other Ambulatory Visit (HOSPITAL_COMMUNITY): Payer: Self-pay | Admitting: Urology

## 2024-01-28 DIAGNOSIS — C61 Malignant neoplasm of prostate: Secondary | ICD-10-CM

## 2024-02-04 NOTE — Consults
 INITIAL ALLERGY/IMMUNOLOGY OUTPATIENT VISIT  Patient Consent to Telehealth   The patient agreed to participate in the video visit prior to joining the visit.      Patient name:  Maurice Ramirez  MRN:  [id number]  DOB:  Dec 29, 1950  Date of service:  02/23/2024    Referring provider: Lourena Royal., MD  PCP: Charle Congo, MD    Reason for Visit: No chief complaint on file.     HPI   Maurice Ramirez is a 73 y.o. male with HLD, CAD, a-fib presenting for allergy evaluation    #chronic rhinitis  #OSA on CPAP  #history of ABPA  #asthma, moderate persistent    ADDITIONAL ATOPIC HISTORY:  Allergic rhinitis: {RESPONSES; YES/NO AVWUJWJ:19147}  Asthma: {RESPONSES; YES/NO WGNFAOZ:30865}  Atopic dermatitis: {RESPONSES; YES/NO HQIONGE:95284}  Food allergy: {RESPONSES; YES/NO CAPITAL:18887}  Adverse reaction to stinging insect (vespid): {''never stung'',''local swelling'',''large local reaction crossing joints'',''diffuse hives'',''respiratory symptoms'',''gastrointestinal symptoms'',''anaphylaxis'':33573}  Urticaria: {RESPONSES; YES/NO XLKGMWN:02725}  Angioedema: {RESPONSES; YES/NO CAPITAL:18887}  Adverse reactions to medications: {RESPONSES; YES/NO DGUYQIH:47425}  Recurrent infections: {RESPONSES; YES/NO ZDGLOVF:64332}  Autoimmunity: {RESPONSES; YES/NO RJJOACZ:66063}    Environmental History:  Current residence: {scc9:26170} for *** {KZSW10:93235}  Bedroom floor: {''carpet'',''hardwood floor'':33574}  Dust mite covers mattress: {RESPONSES; YES/NO TDDUKGU:54270}  Dust mite covers pillow: {RESPONSES; YES/NO WCBJSEG:31517}  Pets: {RESPONSES; YES/NO OHYWVPX:10626}  Windows closed at night: {RESPONSES; YES/NO RSWNIOE:70350}  Upholstered furniture in the bedroom: {RESPONSES; YES/NO KXFGHWE:99371}  Down comforter: {RESPONSES; YES/NO IRCVELF:81017}  Hepa-filter: {RESPONSES; YES/NO PZWCHEN:27782}  Smoker and/or smoke exposure: {RESPONSES; YES/NO UMPNTIR:44315}    IMM     Immunization History   Administered Date(s) Administered    COVID-19 (Moderna 12y and up) PF, 50 mcg/0.5 mL 07/17/2022, 06/17/2023    COVID-19, mRNA, (Moderna) 100 mcg/0.5 mL 11/03/2019, 12/01/2019, 09/16/2020    influenza vaccine IM quadrivalent adjuvanted (FluAD Quad) (PF) SYR (84 years of age and older) 06/29/2021    influenza vaccine IM quadrivalent high dose (Fluzone High Dose Quad) (PF) SYR (8 years of age and older) 07/27/2019    influenza vaccine IM trivalent adjuvanted (FluAD) (PF) SYR (42 years of age and older) 09/10/2017    influenza vaccine IM trivalent high dose (Fluzone High Dose) (PF) SYR (9 years of age and older) 09/03/2016, 07/14/2018    influenza, unspecified formulation 08/20/2001, 09/01/2007, 09/11/2010, 10/19/2012    pneumococcal conjugate vaccine 13-valent (Prevnar) 09/03/2016    pneumococcal polysaccharide vaccine 23-valent (Pneumovax) 09/28/2013      PMH   No past medical history on file.  PSH   No past surgical history on file.  ALL     Allergies   Allergen Reactions    Chocolate     Pollen Extract     Wheat      MEDS     No outpatient medications have been marked as taking for the 02/23/24 encounter (Appointment) with Larey Plenty., MD.     [facility]   Lives at home with ***  Employed ***  Social History     Tobacco Use    Smoking status: Never    Smokeless tobacco: Never   Substance Use Topics    Alcohol use: Not on file     Social History     Social History Narrative    Not on file     [facility]   No family history on file.  Atopy: {RESPONSES; YES/NO QMGQQPY:19509}  Autoimmune disorder: {RESPONSES; YES/NO TOIZTIW:58099}  Malignancy: {RESPONSES; YES/NO IPJASNK:53976}    PHYSICAL EXAM      There were  no vitals filed for this visit.  Wt Readings from Last 3 Encounters:   12/04/23 167 lb 3.2 oz (75.8 kg)   06/05/23 159 lb (72.1 kg)   12/26/22 168 lb (76.2 kg)     There is no height or weight on file to calculate BMI.  Physical Exam            System Check if normal Positive or additional negative findings   Constit  [x]  General appearance     Eyes  [x]  Conj/Lids []  Pupils     []  Fundi  []  Scleral injection     ENMT  [x]  External ears/nose    []  Otoscopy   [x]  gross Hearing    []  Nasal mucosa   []  Lips/teeth/gums   []  Oropharynx    []  mucus membranes  []  Allergic shiners   []  Dennie-morgan lines   []  Pale nasal mucosa   []  Nasal polypoid tissue   []  Cobblestoning of posterior oropharynx     Neck  []  Inspection/palpation    []  Thyroid     Resp  [x]  Normal effort    []  No Wheezing    []  Auscultation     [] No Crackles  []  Prolonged expiration   []  Wheezing    []  Rales    []  Crackles    CV  []  Rhythm/rate   []  Murmurs checked   []  LEE   [x]  JVP non-elevated    Normal pulses:   []  radial []  Pedal          GI  []  No abd masses    []  No tenderness   []  No rebound/guarding   []  Liver/spleen      GU  [x]  Not applicable  M: []  Scrotum []  Penis        []  Prostate    F: []  External []  vaginal wall        []  Cervix  []  mucus       []  Uterus  []  Adnexa      Lymph  []  Neck        []  Axillae    []  Lymphadenopathy    MSK Specify site examined:    []  Inspect/palp []  ROM   [x]  Stability []  Strength/tone         Skin  [x]  Inspection []  Palpation  []  Dry   []  Erythematous    []  Dermatographic urticaria    []  Darier sign   Neuro  [x]  CN2-12 intact grossly   [x]  Alert and oriented   []  Patellar DTR      []  Sensation   [x]  Gait/balance     Psych  [x]  Insight/judgement     [x]  Mood/affect    [x]  Gross cognition        OFFICE SPIROMETRY RESULTS     PFTs (12/26/22): mild obstruction, 6.5% change in FEV1, 11.8% change in FVC post-BD      SEROLOGY   No eosinophilia       Latest Ref Rng & Units 09/27/2014     9:15 AM 08/01/2015    10:29 AM 09/03/2016     1:01 PM 07/14/2018    10:22 AM 12/26/2022     8:08 AM 06/05/2023    12:37 PM 12/04/2023    10:38 AM   CBC   White Blood Cell Count 4.16 - 9.95 x10E3/uL 9.18  5.65  6.53  5.78  7.24  6.71  7.18    Red Blood Cell Count 4.41 - 5.95 x10E6/uL  4.58  4.55  4.46  4.64  4.43  4.58  4.62    Hemoglobin 13.5 - 17.1 g/dL 29.5  62.1  30.8  65.7  14.1  14.5  14.4    Hematocrit 38.5 - 52.0 % 44.3  43.9  41.9  45.3  43.5  43.8  44.2    Mean Corpuscular Volume 79.3 - 98.6 fL 96.7  96.5  93.9  97.6  98.2  95.6  95.7    Red Cell Distribution Width-CV 11.1 - 15.5 % 12.9  12.8  12.9  12.7  12.6  12.5  12.0    Mean Corpuscular Hemoglobin 26.4 - 33.4 pg 32.8  33.0  31.6  32.8  31.8  31.7  31.2    MCH Concentration 31.5 - 35.5 g/dL 84.6  96.2  95.2  84.1  32.4  33.1  32.6    Platelet Count, Auto 143 - 398 x10E3/uL 240  231  232  244  250  237  228    Neutrophil Percent, Auto No Ref. Range % 57.2  36.8  35.0  43.6  45.5  44.8  47.0    Red Cell Distribution Width-SD 36.9 - 48.3 fL 45.1  45.2  44.3  45.5  45.6  43.8  42.0    Mean Platelet Volume 9.3 - 13.0 fL 11.3  11.0  10.3  10.7  9.9  10.0  10.2    Nucleated RBC%, automated No Ref. Range % 0.0  0.0  0.0  0.0  0.0  0.0  0.0    Absolute Nucleated RBC Count 0.00 - 0.00 x10E3/uL 0.0  0.00  0.00  0.00  0.00  0.00  0.00    Eosinophil Percent, Auto No Ref. Range % 5.0  6.4  8.3  5.5  2.5  2.7  2.9    Basophil Percent, Auto No Ref. Range % 0.4  0.7  0.8  0.9  0.6  0.7  0.6    Absolute Neut Count 1.80 - 6.90 x10E3/uL 5.3  2.08  2.29  2.52  3.30  3.00  3.38    Absolute Lymphocyte Count 1.30 - 3.40 x10E3/uL 2.8  2.80  3.09  2.40  2.93  2.85  2.75    Absolute Mono Count 0.20 - 0.80 x10E3/uL 0.6  0.37  0.53  0.48  0.76  0.58  0.78    Absolute Eos Count 0.00 - 0.50 x10E3/uL 0.5  0.36  0.54  0.32  0.18  0.18  0.21    Absolute Baso Count 0.00 - 0.10 x10E3/uL 0.0  0.04  0.05  0.05  0.04  0.05  0.04      Lab Results   Component Value Date    IGMSER 110 05/07/2005     No results found for: ''IGGSER''  No results found for: ''IGASER''  Component      Latest Ref Rng 10/16/2004  3:59 PM 05/07/2005  5:05 PM 02/14/2006  10:35 AM 03/17/2007  5:14 PM 05/26/2007  5:44 PM 09/01/2007  5:22 PM 10/27/2007  3:05 PM 12/22/2007  1:58 PM 01/19/2008  3:56 PM 04/12/2008  2:51 PM 06/07/2008  4:30 PM 08/02/2008  3:10 PM 10/25/2008  2:51 PM 05/09/2009  5:20 PM 05/08/2010  5:22 PM 09/11/2010  4:35 PM 06/04/2011  1:15 PM 09/27/2014  9:15 AM 08/01/2015  10:29 AM 09/03/2016  1:01 PM 07/14/2018  10:22 AM 12/26/2022  8:08 AM 06/05/2023  12:37 PM 12/04/2023  10:38 AM   Aspergillus Fumigatus (M3) IgE      . kUA/L  33.50 (H)    Aspergillus Fumigatus (M3) IgE Class      0                         4  (Very High) !    Aspergillus fumigatus IgE RAST 1909  5294  5554  10425  6698  8505   19.10  16.10  --  --   --  --   --  0.51           Aspergillus [place] IgE RAST 2089  4056  5522  4556  5623  7004   2.01  1.79  --  --   --  --   --  0.71           IgE      kIU/L 1545   1846  3107  1172  1988  2325  1836  1473  972  763  779  1132  887  644  765  820  1,319  1,124  1,422  1,666  2,119  2,375  3,001    Asparagus IgE RAST      <=0.34 kU/L                    <0.10        A.fumigatus (m3) IgG      <2.0 mcg/mL                        58.5 (H)    Elevated total IgE - consistent with atopic predisposition  Component      Latest Ref Rng 12/26/2022  8:08 AM 06/05/2023  12:37 PM 12/04/2023  10:38 AM   IgE      kIU/L 2,119  2,375  3,001      No results found for: ''VITD25OH''  CT chest wo contrast (09/24/11):  Impression   Impression:     Atherosclerotic calcification of the thoracic aorta and left anterior  descending coronary artery.     Persistent small sliding hiatal hernia.     Unchanged biapical fibronodular pleural parenchymal scarring and  calcified subcentimeter nodule within the right middle lobe (2-51),  consistent with prior granulomatous disease.     Persistent diffuse peribronchial and peribronchiolar thickening with  associated airway irregularity and occasional ectasia, consistent  with chronic asthma/bronchitis, with scattered bronchocentric bands  and architectural distortion within both upper, right middle and left  greater than right lower lobes, consistent with postinflammatory  pleural parenchymal scarring.     Near-complete resolution of mass-like consolidation within the left lower lobe with residual architectural distortion (2-56). Several fluctuating subcentimeter nodules within the left lower lobe and resolved within the right lower lobe since 05/28/2011, likely airway-related inflammatory and mucus impaction.     SKIN TEST          No data to display                   No data to display                ASSESSMENT:     Maurice Ramirez is a 73 y.o. male who presents with:  IMPRESSION:  1. Asthma, moderate persistent  2. History of ABPA  3. Elevated total IgE  4. OSA on CPAP - ***    RECOMMENDATIONS:     ***    Patient is in agreement with plan of care.  I have answered all  questions satisfactorily.    No follow-ups on file. Or return sooner with any questions or concerns.     Thank you Zeidler, Brand Cai., MD for your consultation, my recommendations are above. Please call with any questions.     ***minutes were spent personally by me today on this encounter which include today's pre-visit review of the chart, obtaining appropriate history, performing an evaluation, documentation and discussion of management with details supported within the note for today's visit. The time documented was exclusive of any time spent on the separately billed procedure.    Adela Ades, MD  Allergy & Immunology

## 2024-02-05 DIAGNOSIS — M170 Bilateral primary osteoarthritis of knee: Secondary | ICD-10-CM | POA: Diagnosis not present

## 2024-02-05 DIAGNOSIS — E1159 Type 2 diabetes mellitus with other circulatory complications: Secondary | ICD-10-CM | POA: Diagnosis not present

## 2024-02-05 DIAGNOSIS — Z6837 Body mass index (BMI) 37.0-37.9, adult: Secondary | ICD-10-CM | POA: Diagnosis not present

## 2024-02-05 DIAGNOSIS — E6609 Other obesity due to excess calories: Secondary | ICD-10-CM | POA: Diagnosis not present

## 2024-02-07 ENCOUNTER — Other Ambulatory Visit: Payer: Self-pay | Admitting: Internal Medicine

## 2024-02-11 DIAGNOSIS — I10 Essential (primary) hypertension: Secondary | ICD-10-CM | POA: Diagnosis not present

## 2024-02-11 DIAGNOSIS — E782 Mixed hyperlipidemia: Secondary | ICD-10-CM | POA: Diagnosis not present

## 2024-02-11 DIAGNOSIS — E1159 Type 2 diabetes mellitus with other circulatory complications: Secondary | ICD-10-CM | POA: Diagnosis not present

## 2024-02-23 ENCOUNTER — Ambulatory Visit: Payer: BLUE CROSS/BLUE SHIELD

## 2024-02-23 DIAGNOSIS — R768 Other specified abnormal immunological findings in serum: Secondary | ICD-10-CM

## 2024-02-23 DIAGNOSIS — J310 Chronic rhinitis: Secondary | ICD-10-CM

## 2024-02-23 DIAGNOSIS — J4540 Moderate persistent asthma, uncomplicated: Secondary | ICD-10-CM

## 2024-02-23 DIAGNOSIS — G4733 Obstructive sleep apnea (adult) (pediatric): Secondary | ICD-10-CM

## 2024-02-23 DIAGNOSIS — B4481 Allergic bronchopulmonary aspergillosis: Secondary | ICD-10-CM

## 2024-02-24 MED ORDER — PROAIR HFA 108 (90 BASE) MCG/ACT IN AERS
2 | RESPIRATORY_TRACT | 1 refills | 25.00000 days | Status: AC | PRN
Start: 2024-02-24 — End: ?

## 2024-03-03 ENCOUNTER — Other Ambulatory Visit (HOSPITAL_COMMUNITY): Payer: Self-pay | Admitting: Urology

## 2024-03-03 ENCOUNTER — Encounter (HOSPITAL_COMMUNITY)
Admission: RE | Admit: 2024-03-03 | Discharge: 2024-03-03 | Disposition: A | Discharge status: Home / Self Care | Source: Ambulatory Visit | Attending: Urology | Admitting: Urology

## 2024-03-03 ENCOUNTER — Ambulatory Visit (HOSPITAL_COMMUNITY)
Admission: RE | Admit: 2024-03-03 | Discharge: 2024-03-03 | Disposition: A | Discharge status: Home / Self Care | Source: Ambulatory Visit | Attending: Urology | Admitting: Urology

## 2024-03-03 DIAGNOSIS — C61 Malignant neoplasm of prostate: Secondary | ICD-10-CM

## 2024-03-03 DIAGNOSIS — K5730 Diverticulosis of large intestine without perforation or abscess without bleeding: Secondary | ICD-10-CM | POA: Diagnosis not present

## 2024-03-03 DIAGNOSIS — K760 Fatty (change of) liver, not elsewhere classified: Secondary | ICD-10-CM | POA: Diagnosis not present

## 2024-03-03 DIAGNOSIS — N200 Calculus of kidney: Secondary | ICD-10-CM | POA: Diagnosis not present

## 2024-03-03 MED ORDER — TECHNETIUM TC 99M MEDRONATE IV KIT
20.0000 | PACK | Freq: Once | INTRAVENOUS | Status: AC | PRN
  Administered 2024-03-03: 21.5 via INTRAVENOUS

## 2024-03-03 MED ORDER — IOHEXOL 300 MG/ML  SOLN
100.0000 mL | Freq: Once | INTRAMUSCULAR | Status: AC | PRN
  Administered 2024-03-03: 100 mL via INTRAVENOUS

## 2024-03-10 DIAGNOSIS — C61 Malignant neoplasm of prostate: Secondary | ICD-10-CM | POA: Diagnosis not present

## 2024-03-12 DIAGNOSIS — E6609 Other obesity due to excess calories: Secondary | ICD-10-CM | POA: Diagnosis not present

## 2024-03-12 DIAGNOSIS — E782 Mixed hyperlipidemia: Secondary | ICD-10-CM | POA: Diagnosis not present

## 2024-03-12 DIAGNOSIS — E1159 Type 2 diabetes mellitus with other circulatory complications: Secondary | ICD-10-CM | POA: Diagnosis not present

## 2024-03-12 DIAGNOSIS — I10 Essential (primary) hypertension: Secondary | ICD-10-CM | POA: Diagnosis not present

## 2024-03-12 DIAGNOSIS — Z6836 Body mass index (BMI) 36.0-36.9, adult: Secondary | ICD-10-CM | POA: Diagnosis not present

## 2024-03-13 DIAGNOSIS — E782 Mixed hyperlipidemia: Secondary | ICD-10-CM | POA: Diagnosis not present

## 2024-03-13 DIAGNOSIS — E1159 Type 2 diabetes mellitus with other circulatory complications: Secondary | ICD-10-CM | POA: Diagnosis not present

## 2024-03-15 DIAGNOSIS — N281 Cyst of kidney, acquired: Secondary | ICD-10-CM | POA: Diagnosis not present

## 2024-03-15 DIAGNOSIS — N5201 Erectile dysfunction due to arterial insufficiency: Secondary | ICD-10-CM | POA: Diagnosis not present

## 2024-03-15 DIAGNOSIS — N393 Stress incontinence (female) (male): Secondary | ICD-10-CM | POA: Diagnosis not present

## 2024-03-15 DIAGNOSIS — C61 Malignant neoplasm of prostate: Secondary | ICD-10-CM | POA: Diagnosis not present

## 2024-03-15 DIAGNOSIS — C775 Secondary and unspecified malignant neoplasm of intrapelvic lymph nodes: Secondary | ICD-10-CM | POA: Diagnosis not present

## 2024-03-17 ENCOUNTER — Inpatient Hospital Stay: Payer: MEDICARE

## 2024-03-17 DIAGNOSIS — J4489 Asthma with chronic obstructive pulmonary disease (COPD) (HCC/RAF): Secondary | ICD-10-CM

## 2024-04-02 ENCOUNTER — Other Ambulatory Visit: Payer: Self-pay | Admitting: Internal Medicine

## 2024-04-12 DIAGNOSIS — E782 Mixed hyperlipidemia: Secondary | ICD-10-CM | POA: Diagnosis not present

## 2024-04-12 DIAGNOSIS — E1159 Type 2 diabetes mellitus with other circulatory complications: Secondary | ICD-10-CM | POA: Diagnosis not present

## 2024-05-07 ENCOUNTER — Telehealth: Payer: Self-pay | Admitting: Internal Medicine

## 2024-05-07 NOTE — Telephone Encounter (Signed)
 Patient stated his visit on 02/10/23 was a referral visit but was charged as a routine visit.  Patient wants to have visit re-coded so Medicare can pay their portion and his Sherleen can cover the remaining balance.

## 2024-05-12 NOTE — Telephone Encounter (Signed)
 Per Levon Gearing, Manager Revenue Cycle:  Contacted patient to discuss the bill he received for DOS 4.29.24. I advised the provider billed a new patient office visit and not a consult. I recommended he contact Medicare to determine why they didn't cover his visit. Patient was referred by his PCP for SVT. It is past timely filing for us  to reprocess claim. No further action needed at this time.  This was also documented in transaction inquiry by Congo.

## 2024-05-13 DIAGNOSIS — E782 Mixed hyperlipidemia: Secondary | ICD-10-CM | POA: Diagnosis not present

## 2024-05-13 DIAGNOSIS — E1159 Type 2 diabetes mellitus with other circulatory complications: Secondary | ICD-10-CM | POA: Diagnosis not present

## 2024-05-19 ENCOUNTER — Other Ambulatory Visit (INDEPENDENT_AMBULATORY_CARE_PROVIDER_SITE_OTHER): Payer: Self-pay | Admitting: Gastroenterology

## 2024-05-19 DIAGNOSIS — N1831 Chronic kidney disease, stage 3a: Secondary | ICD-10-CM | POA: Diagnosis not present

## 2024-05-19 DIAGNOSIS — N189 Chronic kidney disease, unspecified: Secondary | ICD-10-CM | POA: Diagnosis not present

## 2024-05-19 DIAGNOSIS — E1122 Type 2 diabetes mellitus with diabetic chronic kidney disease: Secondary | ICD-10-CM | POA: Diagnosis not present

## 2024-05-19 DIAGNOSIS — I10 Essential (primary) hypertension: Secondary | ICD-10-CM | POA: Diagnosis not present

## 2024-05-19 DIAGNOSIS — N2581 Secondary hyperparathyroidism of renal origin: Secondary | ICD-10-CM | POA: Diagnosis not present

## 2024-05-20 MED ORDER — MONTELUKAST SODIUM 10 MG PO TABS
10 mg | ORAL_TABLET | Freq: Every evening | ORAL | 3 refills
Start: 2024-05-20 — End: ?

## 2024-05-23 MED ORDER — MONTELUKAST SODIUM 10 MG PO TABS
10 mg | ORAL_TABLET | Freq: Every evening | ORAL | 3 refills | 30.00000 days | Status: AC
Start: 2024-05-23 — End: ?

## 2024-05-27 DIAGNOSIS — R809 Proteinuria, unspecified: Secondary | ICD-10-CM | POA: Diagnosis not present

## 2024-05-27 DIAGNOSIS — N1831 Chronic kidney disease, stage 3a: Secondary | ICD-10-CM | POA: Diagnosis not present

## 2024-05-27 DIAGNOSIS — D631 Anemia in chronic kidney disease: Secondary | ICD-10-CM | POA: Diagnosis not present

## 2024-05-27 DIAGNOSIS — I10 Essential (primary) hypertension: Secondary | ICD-10-CM | POA: Diagnosis not present

## 2024-05-27 DIAGNOSIS — N189 Chronic kidney disease, unspecified: Secondary | ICD-10-CM | POA: Diagnosis not present

## 2024-05-27 DIAGNOSIS — E1122 Type 2 diabetes mellitus with diabetic chronic kidney disease: Secondary | ICD-10-CM | POA: Diagnosis not present

## 2024-05-27 DIAGNOSIS — E1129 Type 2 diabetes mellitus with other diabetic kidney complication: Secondary | ICD-10-CM | POA: Diagnosis not present

## 2024-05-27 DIAGNOSIS — N200 Calculus of kidney: Secondary | ICD-10-CM | POA: Diagnosis not present

## 2024-05-27 DIAGNOSIS — N2581 Secondary hyperparathyroidism of renal origin: Secondary | ICD-10-CM | POA: Diagnosis not present

## 2024-06-03 ENCOUNTER — Ambulatory Visit: Payer: MEDICARE

## 2024-06-13 DIAGNOSIS — E782 Mixed hyperlipidemia: Secondary | ICD-10-CM | POA: Diagnosis not present

## 2024-06-13 DIAGNOSIS — E1159 Type 2 diabetes mellitus with other circulatory complications: Secondary | ICD-10-CM | POA: Diagnosis not present

## 2024-06-17 DIAGNOSIS — Z6837 Body mass index (BMI) 37.0-37.9, adult: Secondary | ICD-10-CM | POA: Diagnosis not present

## 2024-06-17 DIAGNOSIS — E1159 Type 2 diabetes mellitus with other circulatory complications: Secondary | ICD-10-CM | POA: Diagnosis not present

## 2024-06-17 DIAGNOSIS — K1239 Other oral mucositis (ulcerative): Secondary | ICD-10-CM | POA: Diagnosis not present

## 2024-06-17 DIAGNOSIS — E6609 Other obesity due to excess calories: Secondary | ICD-10-CM | POA: Diagnosis not present

## 2024-06-17 DIAGNOSIS — R351 Nocturia: Secondary | ICD-10-CM | POA: Diagnosis not present

## 2024-07-02 DIAGNOSIS — E7849 Other hyperlipidemia: Secondary | ICD-10-CM | POA: Diagnosis not present

## 2024-07-02 DIAGNOSIS — I10 Essential (primary) hypertension: Secondary | ICD-10-CM | POA: Diagnosis not present

## 2024-07-05 ENCOUNTER — Ambulatory Visit: Admitting: Family Medicine

## 2024-07-28 ENCOUNTER — Encounter (INDEPENDENT_AMBULATORY_CARE_PROVIDER_SITE_OTHER): Payer: Self-pay | Admitting: Gastroenterology

## 2024-08-12 DIAGNOSIS — J029 Acute pharyngitis, unspecified: Secondary | ICD-10-CM | POA: Diagnosis not present

## 2024-08-15 ENCOUNTER — Emergency Department (HOSPITAL_COMMUNITY)

## 2024-08-15 ENCOUNTER — Inpatient Hospital Stay (HOSPITAL_COMMUNITY)
Admission: EM | Admit: 2024-08-15 | Discharge: 2024-08-22 | DRG: 186 | Disposition: A | Discharge status: Home Health | Attending: Family Medicine | Admitting: Family Medicine

## 2024-08-15 ENCOUNTER — Encounter (HOSPITAL_COMMUNITY): Payer: Self-pay | Admitting: Emergency Medicine

## 2024-08-15 ENCOUNTER — Other Ambulatory Visit: Payer: Self-pay

## 2024-08-15 DIAGNOSIS — J948 Other specified pleural conditions: Secondary | ICD-10-CM | POA: Diagnosis not present

## 2024-08-15 DIAGNOSIS — J441 Chronic obstructive pulmonary disease with (acute) exacerbation: Principal | ICD-10-CM | POA: Diagnosis present

## 2024-08-15 DIAGNOSIS — Z8546 Personal history of malignant neoplasm of prostate: Secondary | ICD-10-CM | POA: Diagnosis not present

## 2024-08-15 DIAGNOSIS — Z7984 Long term (current) use of oral hypoglycemic drugs: Secondary | ICD-10-CM

## 2024-08-15 DIAGNOSIS — J90 Pleural effusion, not elsewhere classified: Secondary | ICD-10-CM | POA: Diagnosis present

## 2024-08-15 DIAGNOSIS — Z9981 Dependence on supplemental oxygen: Secondary | ICD-10-CM

## 2024-08-15 DIAGNOSIS — Z884 Allergy status to anesthetic agent status: Secondary | ICD-10-CM

## 2024-08-15 DIAGNOSIS — J9601 Acute respiratory failure with hypoxia: Secondary | ICD-10-CM

## 2024-08-15 DIAGNOSIS — E876 Hypokalemia: Secondary | ICD-10-CM | POA: Diagnosis present

## 2024-08-15 DIAGNOSIS — E782 Mixed hyperlipidemia: Secondary | ICD-10-CM | POA: Diagnosis not present

## 2024-08-15 DIAGNOSIS — E1122 Type 2 diabetes mellitus with diabetic chronic kidney disease: Secondary | ICD-10-CM | POA: Diagnosis present

## 2024-08-15 DIAGNOSIS — D631 Anemia in chronic kidney disease: Secondary | ICD-10-CM | POA: Diagnosis present

## 2024-08-15 DIAGNOSIS — J9621 Acute and chronic respiratory failure with hypoxia: Secondary | ICD-10-CM | POA: Diagnosis present

## 2024-08-15 DIAGNOSIS — I10 Essential (primary) hypertension: Secondary | ICD-10-CM

## 2024-08-15 DIAGNOSIS — Z79899 Other long term (current) drug therapy: Secondary | ICD-10-CM

## 2024-08-15 DIAGNOSIS — I5033 Acute on chronic diastolic (congestive) heart failure: Secondary | ICD-10-CM | POA: Diagnosis present

## 2024-08-15 DIAGNOSIS — E873 Alkalosis: Secondary | ICD-10-CM | POA: Diagnosis present

## 2024-08-15 DIAGNOSIS — Z9079 Acquired absence of other genital organ(s): Secondary | ICD-10-CM

## 2024-08-15 DIAGNOSIS — R0600 Dyspnea, unspecified: Secondary | ICD-10-CM | POA: Diagnosis not present

## 2024-08-15 DIAGNOSIS — I130 Hypertensive heart and chronic kidney disease with heart failure and stage 1 through stage 4 chronic kidney disease, or unspecified chronic kidney disease: Secondary | ICD-10-CM | POA: Diagnosis present

## 2024-08-15 DIAGNOSIS — J9811 Atelectasis: Secondary | ICD-10-CM | POA: Diagnosis not present

## 2024-08-15 DIAGNOSIS — Z9049 Acquired absence of other specified parts of digestive tract: Secondary | ICD-10-CM

## 2024-08-15 DIAGNOSIS — R846 Abnormal cytological findings in specimens from respiratory organs and thorax: Secondary | ICD-10-CM | POA: Diagnosis not present

## 2024-08-15 DIAGNOSIS — N1831 Chronic kidney disease, stage 3a: Secondary | ICD-10-CM | POA: Diagnosis present

## 2024-08-15 DIAGNOSIS — R0602 Shortness of breath: Secondary | ICD-10-CM | POA: Diagnosis not present

## 2024-08-15 DIAGNOSIS — I5031 Acute diastolic (congestive) heart failure: Secondary | ICD-10-CM | POA: Diagnosis not present

## 2024-08-15 DIAGNOSIS — J939 Pneumothorax, unspecified: Secondary | ICD-10-CM | POA: Diagnosis not present

## 2024-08-15 DIAGNOSIS — R0989 Other specified symptoms and signs involving the circulatory and respiratory systems: Secondary | ICD-10-CM | POA: Diagnosis not present

## 2024-08-15 DIAGNOSIS — E669 Obesity, unspecified: Secondary | ICD-10-CM | POA: Diagnosis present

## 2024-08-15 DIAGNOSIS — J189 Pneumonia, unspecified organism: Secondary | ICD-10-CM | POA: Diagnosis not present

## 2024-08-15 DIAGNOSIS — R918 Other nonspecific abnormal finding of lung field: Secondary | ICD-10-CM | POA: Diagnosis not present

## 2024-08-15 DIAGNOSIS — Z4682 Encounter for fitting and adjustment of non-vascular catheter: Secondary | ICD-10-CM | POA: Diagnosis not present

## 2024-08-15 DIAGNOSIS — I700 Atherosclerosis of aorta: Secondary | ICD-10-CM | POA: Diagnosis not present

## 2024-08-15 DIAGNOSIS — Z87891 Personal history of nicotine dependence: Secondary | ICD-10-CM

## 2024-08-15 DIAGNOSIS — Z8249 Family history of ischemic heart disease and other diseases of the circulatory system: Secondary | ICD-10-CM

## 2024-08-15 DIAGNOSIS — T501X5A Adverse effect of loop [high-ceiling] diuretics, initial encounter: Secondary | ICD-10-CM | POA: Diagnosis not present

## 2024-08-15 DIAGNOSIS — E1165 Type 2 diabetes mellitus with hyperglycemia: Secondary | ICD-10-CM | POA: Diagnosis not present

## 2024-08-15 DIAGNOSIS — K219 Gastro-esophageal reflux disease without esophagitis: Secondary | ICD-10-CM | POA: Diagnosis not present

## 2024-08-15 DIAGNOSIS — Z981 Arthrodesis status: Secondary | ICD-10-CM | POA: Diagnosis not present

## 2024-08-15 DIAGNOSIS — G4733 Obstructive sleep apnea (adult) (pediatric): Secondary | ICD-10-CM | POA: Diagnosis present

## 2024-08-15 DIAGNOSIS — R062 Wheezing: Secondary | ICD-10-CM | POA: Diagnosis not present

## 2024-08-15 DIAGNOSIS — I517 Cardiomegaly: Secondary | ICD-10-CM | POA: Diagnosis not present

## 2024-08-15 DIAGNOSIS — K7460 Unspecified cirrhosis of liver: Secondary | ICD-10-CM | POA: Diagnosis present

## 2024-08-15 DIAGNOSIS — C61 Malignant neoplasm of prostate: Secondary | ICD-10-CM | POA: Diagnosis not present

## 2024-08-15 DIAGNOSIS — Z1152 Encounter for screening for COVID-19: Secondary | ICD-10-CM | POA: Diagnosis not present

## 2024-08-15 DIAGNOSIS — R0902 Hypoxemia: Secondary | ICD-10-CM | POA: Diagnosis not present

## 2024-08-15 DIAGNOSIS — N179 Acute kidney failure, unspecified: Secondary | ICD-10-CM | POA: Diagnosis not present

## 2024-08-15 DIAGNOSIS — J984 Other disorders of lung: Secondary | ICD-10-CM | POA: Diagnosis not present

## 2024-08-15 LAB — CBC WITH DIFFERENTIAL/PLATELET
Abs Immature Granulocytes: 0.07 K/uL (ref 0.00–0.07)
Basophils Absolute: 0.1 K/uL (ref 0.0–0.1)
Basophils Relative: 1 %
Eosinophils Absolute: 0.6 K/uL — ABNORMAL HIGH (ref 0.0–0.5)
Eosinophils Relative: 6 %
HCT: 46.7 % (ref 39.0–52.0)
Hemoglobin: 14.4 g/dL (ref 13.0–17.0)
Immature Granulocytes: 1 %
Lymphocytes Relative: 15 %
Lymphs Abs: 1.4 K/uL (ref 0.7–4.0)
MCH: 29.3 pg (ref 26.0–34.0)
MCHC: 30.8 g/dL (ref 30.0–36.0)
MCV: 95.1 fL (ref 80.0–100.0)
Monocytes Absolute: 0.6 K/uL (ref 0.1–1.0)
Monocytes Relative: 7 %
Neutro Abs: 6.4 K/uL (ref 1.7–7.7)
Neutrophils Relative %: 70 %
Platelets: 239 K/uL (ref 150–400)
RBC: 4.91 MIL/uL (ref 4.22–5.81)
RDW: 17 % — ABNORMAL HIGH (ref 11.5–15.5)
WBC: 9.1 K/uL (ref 4.0–10.5)
nRBC: 0.4 % — ABNORMAL HIGH (ref 0.0–0.2)

## 2024-08-15 LAB — COMPREHENSIVE METABOLIC PANEL WITH GFR
ALT: 10 U/L (ref 0–44)
AST: 15 U/L (ref 15–41)
Albumin: 3.9 g/dL (ref 3.5–5.0)
Alkaline Phosphatase: 65 U/L (ref 38–126)
Anion gap: 7 (ref 5–15)
BUN: 10 mg/dL (ref 8–23)
CO2: 34 mmol/L — ABNORMAL HIGH (ref 22–32)
Calcium: 8.8 mg/dL — ABNORMAL LOW (ref 8.9–10.3)
Chloride: 101 mmol/L (ref 98–111)
Creatinine, Ser: 1.45 mg/dL — ABNORMAL HIGH (ref 0.61–1.24)
GFR, Estimated: 51 mL/min — ABNORMAL LOW (ref >60)
Glucose, Bld: 254 mg/dL — ABNORMAL HIGH (ref 70–99)
Potassium: 4.2 mmol/L (ref 3.5–5.1)
Sodium: 142 mmol/L (ref 135–145)
Total Bilirubin: 0.5 mg/dL (ref 0.0–1.2)
Total Protein: 7.2 g/dL (ref 6.5–8.1)

## 2024-08-15 LAB — RESP PANEL BY RT-PCR (RSV, FLU A&B, COVID)  RVPGX2
Influenza A by PCR: NEGATIVE
Influenza B by PCR: NEGATIVE
Resp Syncytial Virus by PCR: NEGATIVE
SARS Coronavirus 2 by RT PCR: NEGATIVE

## 2024-08-15 LAB — TROPONIN T, HIGH SENSITIVITY: Troponin T High Sensitivity: <15 ng/L (ref 0–19)

## 2024-08-15 LAB — PRO BRAIN NATRIURETIC PEPTIDE: Pro Brain Natriuretic Peptide: 1492 pg/mL — ABNORMAL HIGH (ref <300.0)

## 2024-08-15 MED ORDER — BISOPROLOL FUMARATE 10 MG PO TABS
10.0000 mg | ORAL_TABLET | Freq: Every day | ORAL | Status: DC
  Administered 2024-08-16 – 2024-08-22 (×7): 10 mg via ORAL
  Filled 2024-08-15 (×7): qty 1

## 2024-08-15 MED ORDER — IRBESARTAN 150 MG PO TABS
75.0000 mg | ORAL_TABLET | Freq: Every day | ORAL | Status: DC
  Administered 2024-08-16 – 2024-08-21 (×6): 75 mg via ORAL
  Filled 2024-08-15 (×6): qty 1

## 2024-08-15 MED ORDER — METHYLPREDNISOLONE SODIUM SUCC 40 MG IJ SOLR: 40.0000 mg | Freq: Two times a day (BID) | INTRAMUSCULAR | Status: DC

## 2024-08-15 MED ORDER — FENTANYL CITRATE (PF) 100 MCG/2ML IJ SOLN
50.0000 ug | Freq: Once | INTRAMUSCULAR | Status: AC
  Administered 2024-08-15: 50 ug via INTRAVENOUS
  Filled 2024-08-15: qty 2

## 2024-08-15 MED ORDER — IPRATROPIUM-ALBUTEROL 0.5-2.5 (3) MG/3ML IN SOLN
3.0000 mL | Freq: Once | RESPIRATORY_TRACT | Status: AC
  Administered 2024-08-15: 3 mL via RESPIRATORY_TRACT
  Filled 2024-08-15: qty 3

## 2024-08-15 MED ORDER — AZITHROMYCIN 250 MG PO TABS
250.0000 mg | ORAL_TABLET | Freq: Every day | ORAL | Status: AC
  Administered 2024-08-17 – 2024-08-20 (×4): 250 mg via ORAL
  Filled 2024-08-15 (×4): qty 1

## 2024-08-15 MED ORDER — IPRATROPIUM-ALBUTEROL 0.5-2.5 (3) MG/3ML IN SOLN
3.0000 mL | Freq: Four times a day (QID) | RESPIRATORY_TRACT | Status: DC
  Administered 2024-08-16 (×2): 3 mL via RESPIRATORY_TRACT
  Filled 2024-08-15 (×2): qty 3

## 2024-08-15 MED ORDER — DM-GUAIFENESIN ER 30-600 MG PO TB12
1.0000 | ORAL_TABLET | Freq: Two times a day (BID) | ORAL | Status: DC
  Administered 2024-08-16 – 2024-08-22 (×13): 1 via ORAL
  Filled 2024-08-15 (×13): qty 1

## 2024-08-15 MED ORDER — SODIUM CHLORIDE 0.9 % IV SOLN
2.0000 g | Freq: Once | INTRAVENOUS | Status: AC
  Administered 2024-08-15: 2 g via INTRAVENOUS
  Filled 2024-08-15: qty 20

## 2024-08-15 MED ORDER — CALCIUM CARBONATE 1250 (500 CA) MG PO TABS
1.0000 | ORAL_TABLET | Freq: Every day | ORAL | Status: DC
  Administered 2024-08-16 – 2024-08-22 (×7): 1250 mg via ORAL
  Filled 2024-08-15 (×7): qty 1

## 2024-08-15 MED ORDER — SODIUM CHLORIDE 0.9 % IV SOLN
100.0000 mg | Freq: Two times a day (BID) | INTRAVENOUS | Status: DC
  Administered 2024-08-15 – 2024-08-16 (×2): 100 mg via INTRAVENOUS
  Filled 2024-08-15 (×5): qty 100

## 2024-08-15 MED ORDER — FAMOTIDINE 20 MG PO TABS
20.0000 mg | ORAL_TABLET | Freq: Every day | ORAL | Status: DC
  Administered 2024-08-16 – 2024-08-22 (×7): 20 mg via ORAL
  Filled 2024-08-15 (×8): qty 1

## 2024-08-15 MED ORDER — PANTOPRAZOLE SODIUM 40 MG PO TBEC
40.0000 mg | DELAYED_RELEASE_TABLET | Freq: Every day | ORAL | Status: DC
  Administered 2024-08-16 – 2024-08-22 (×7): 40 mg via ORAL
  Filled 2024-08-15 (×7): qty 1

## 2024-08-15 MED ORDER — IPRATROPIUM-ALBUTEROL 0.5-2.5 (3) MG/3ML IN SOLN
3.0000 mL | RESPIRATORY_TRACT | Status: DC | PRN
  Filled 2024-08-15: qty 3

## 2024-08-15 MED ORDER — AZITHROMYCIN 500 MG PO TABS
500.0000 mg | ORAL_TABLET | Freq: Every day | ORAL | Status: AC
  Administered 2024-08-16: 500 mg via ORAL
  Filled 2024-08-15: qty 1

## 2024-08-15 MED ORDER — METHYLPREDNISOLONE SODIUM SUCC 125 MG IJ SOLR
125.0000 mg | Freq: Once | INTRAMUSCULAR | Status: AC
  Administered 2024-08-15: 125 mg via INTRAVENOUS
  Filled 2024-08-15: qty 2

## 2024-08-15 MED ORDER — IOHEXOL 300 MG/ML  SOLN
75.0000 mL | Freq: Once | INTRAMUSCULAR | Status: AC | PRN
  Administered 2024-08-15: 75 mL via INTRAVENOUS

## 2024-08-15 MED ORDER — LACTATED RINGERS IV SOLN: INTRAVENOUS | Status: AC

## 2024-08-15 MED ORDER — ROSUVASTATIN CALCIUM 20 MG PO TABS
40.0000 mg | ORAL_TABLET | Freq: Every day | ORAL | Status: DC
  Administered 2024-08-16 – 2024-08-22 (×7): 40 mg via ORAL
  Filled 2024-08-15 (×7): qty 2

## 2024-08-15 NOTE — ED Provider Notes (Cosign Needed Addendum)
  EMERGENCY DEPARTMENT AT [facility] Provider Note   CSN: [id number] Arrival date & time: 08/15/24  1513     Patient presents with: Shortness of Breath   PHU RECORD is a 73 y.o. male.   Patient is a 73 year old male who presents to the emergency department the chief complaint of shortness of breath, productive cough which has been ongoing for approximate the past week.  Patient notes that he was recently seen in urgent care and placed on azithromycin  and did complete this.  Patient notes that he has had no increase edema in lower extremities.  He has had no associated dizziness, lightheadedness or syncope.  He denies any chest pain at this time.  He denies any abdominal pain, nausea, vomiting, diarrhea.  He notes that he has had a previous thoracentesis.   Shortness of Breath Associated symptoms: cough        Prior to Admission medications   Medication Sig Start Date End Date Taking? Authorizing Provider  ALPRAZolam (XANAX) 0.5 MG tablet Take 0.5 mg by mouth at bedtime as needed for anxiety or sleep. 06/17/24   [provider]  azithromycin  (ZITHROMAX ) 250 MG tablet Take 250 mg by mouth as directed. 08/12/24   [provider]  bisoprolol  (ZEBETA ) 10 MG tablet Take 10 mg by mouth daily.    [provider]  Dapagliflozin Propanediol (FARXIGA PO) Take by mouth.    [provider]  docusate sodium  (COLACE) 100 MG capsule Take 100 mg by mouth daily.    [provider]  famotidine  (PEPCID ) 20 MG tablet Take 20 mg by mouth daily. 10/01/23   [provider]  ferrous sulfate  325 (65 FE) MG tablet Take 1 tablet (325 mg total) by mouth 3 (three) times daily with meals. Patient taking differently: Take 325 mg by mouth daily with breakfast. 11/15/22 07/28/23  Willette Adriana LABOR, MD  furosemide  (LASIX ) 40 MG tablet Take 1 tablet (40 mg total) by mouth daily as needed (swelling or shortness of breath). 05/02/23   Mallipeddi,  Vishnu P, MD  glimepiride  (AMARYL ) 1 MG tablet Take 1 mg by mouth 2 (two) times daily. 09/18/22   [provider]  HYDROcodone -acetaminophen  (NORCO) 10-325 MG tablet Take 1 tablet by mouth 4 (four) times daily as needed for moderate pain (pain score 4-6). Takes 1/2 tablet 10/25/22   [provider]  ondansetron  (ZOFRAN ) 4 MG tablet Take 4 mg by mouth every 8 (eight) hours as needed. 10/01/23   [provider]  OXYGEN  Inhale 3 L into the lungs at bedtime.    [provider]  pantoprazole  (PROTONIX ) 40 MG tablet Take 1 tablet (40 mg total) by mouth daily. 08/09/22   Ricky Fines, MD  rosuvastatin  (CRESTOR ) 40 MG tablet TAKE ONE TABLET (40MG  TOTAL) BY MOUTH DAILY 02/09/24   Mallipeddi, Vishnu P, MD  traMADol (ULTRAM) 50 MG tablet Take 50 mg by mouth every 6 (six) hours as needed. 06/17/24   [provider]  valsartan  (DIOVAN ) 80 MG tablet TAKE ONE TABLET (80 MG TOTAL) BY MOUTH DAILY. 04/03/24   [name], MD  Vitamin D , Ergocalciferol , 50000 units CAPS Take 1 capsule by mouth once a week. 06/04/23   [provider]    Allergies: Lidocaine , Benzocaine, and Other    Review of Systems  Respiratory:  Positive for cough and shortness of breath.   All other systems reviewed and are negative.   Updated Vital Signs BP (!) 156/61   Pulse 78  Temp 98.9 F (37.2 C) (Oral)   Resp 17   Ht 5' 7 (1.702 m)   Wt 108 kg   SpO2 92%   BMI 37.28 kg/m   Physical Exam Vitals and nursing note reviewed.  Constitutional:      General: He is not in acute distress.    Appearance: Normal appearance. He is not ill-appearing.  HENT:     Head: Normocephalic and atraumatic.     Nose: Nose normal.     Mouth/Throat:     Mouth: Mucous membranes are moist.  Eyes:     Extraocular Movements: Extraocular movements intact.     Conjunctiva/sclera: Conjunctivae normal.     Pupils: Pupils are equal, round, and reactive to light.  Cardiovascular:     Rate and  Rhythm: Normal rate and regular rhythm.     Pulses: Normal pulses.     Heart sounds: Normal heart sounds. No murmur heard.    No gallop.  Pulmonary:     Effort: Pulmonary effort is normal.     Breath sounds: Examination of the right-upper field reveals wheezing. Examination of the left-upper field reveals wheezing. Examination of the right-lower field reveals wheezing and rales. Examination of the left-lower field reveals wheezing. Wheezing and rales present.  Chest:     Chest wall: No tenderness.  Abdominal:     General: Abdomen is flat. Bowel sounds are normal.     Palpations: Abdomen is soft.     Tenderness: There is no abdominal tenderness. There is no guarding.  Musculoskeletal:        General: Normal range of motion.     Cervical back: Normal range of motion and neck supple.     Right lower leg: No edema.     Left lower leg: No edema.  Skin:    General: Skin is warm and dry.  Neurological:     General: No focal deficit present.     Mental Status: He is alert and oriented to person, place, and time. Mental status is at baseline.  Psychiatric:        Mood and Affect: Mood normal.        Behavior: Behavior normal.        Thought Content: Thought content normal.        Judgment: Judgment normal.     (all labs ordered are listed, but only abnormal results are displayed) Labs Reviewed  COMPREHENSIVE METABOLIC PANEL WITH GFR - Abnormal; Notable for the following components:      Result Value   CO2 34 (*)    Glucose, Bld 254 (*)    Creatinine, Ser 1.45 (*)    Calcium  8.8 (*)    GFR, Estimated 51 (*)    All other components within normal limits  CBC WITH DIFFERENTIAL/PLATELET - Abnormal; Notable for the following components:   RDW 17.0 (*)    nRBC 0.4 (*)    Eosinophils Absolute 0.6 (*)    All other components within normal limits  PRO BRAIN NATRIURETIC PEPTIDE - Abnormal; Notable for the following components:   Pro Brain Natriuretic Peptide 1,492.0 (*)    All other  components within normal limits  RESP PANEL BY RT-PCR (RSV, FLU A&B, COVID)  RVPGX2  URINALYSIS, ROUTINE W REFLEX MICROSCOPIC  TROPONIN T, HIGH SENSITIVITY    EKG: None  Radiology: CT Chest W Contrast Result Date: 08/15/2024 CLINICAL DATA:  Short of breath for 2 days, hypoxia, pneumothorax on earlier chest x-ray, history of prostate cancer EXAM: CT CHEST  WITH CONTRAST TECHNIQUE: Multidetector CT imaging of the chest was performed during intravenous contrast administration. RADIATION DOSE REDUCTION: This exam was performed according to the departmental dose-optimization program which includes automated exposure control, adjustment of the mA and/or kV according to patient size and/or use of iterative reconstruction technique. CONTRAST:  75mL OMNIPAQUE  IOHEXOL  300 MG/ML  SOLN COMPARISON:  11/19/2023, 08/15/2024 FINDINGS: Cardiovascular: The heart is unremarkable without pericardial effusion. No evidence of thoracic aortic aneurysm or dissection. Atherosclerosis of the aorta and coronary vasculature. Mediastinum/Nodes: No enlarged mediastinal, hilar, or axillary lymph nodes. Thyroid  gland, trachea, and esophagus demonstrate no significant findings. Lungs/Pleura: Right-sided hydropneumothorax is identified. Fluid component measures less than 1 L. anterior pneumothorax component volume estimated less than 10-15%. No tension effect or midline shift. There is deep and consolidation within the right lung, most pronounced in the right lower lobe, with associated volume loss. This favors atelectasis over airspace disease. Follow-up to resolution is recommended to exclude underlying mass. The left chest is clear.  Central airways are patent. Upper Abdomen: Cirrhotic morphology of the liver, with subtle nodularity of the liver capsule and hypertrophy of the left lobe. No acute upper abdominal findings. Musculoskeletal: No acute or destructive bony abnormalities. Reconstructed images demonstrate no additional findings.  IMPRESSION: 1. Right-sided hydropneumothorax as above. Fluid component measures less than 1 L. Anterior pneumothorax component measures less than 10-15%. No tension effect or midline shift. 2. Dependent consolidation within the right lung, most pronounced within the right lower lobe, with associated volume loss. Favor atelectasis over pneumonia. Follow-up to resolution recommended to ensure resolution and exclude underlying mass lesion. 3. Aortic Atherosclerosis (ICD10-I70.0). Coronary artery atherosclerosis. 4. Hepatic cirrhosis. Electronically Signed   By: Ozell Daring M.D.   On: 08/15/2024 17:37   DG Chest Port 1 View Result Date: 08/15/2024 EXAM: 1 VIEW XRAY OF THE CHEST 08/15/2024 04:10:22 PM COMPARISON: Comparison to 11/08/2022. CLINICAL HISTORY: dyspnea dyspnea FINDINGS: LUNGS AND PLEURA: Interval development of small right apical pneumothorax. Right lower lobe opacity is noted concerning for pneumonia or atelectasis with associated effusion. CT scan is recommended to rule out possible underlying mass. No pulmonary edema. HEART AND MEDIASTINUM: Stable cardiomegaly. BONES AND SOFT TISSUES: No acute osseous abnormality. IMPRESSION: 1. Small right apical pneumothorax, new from prior exam. 2. Right lower lobe opacity with associated effusion, suspicious for pneumonia or atelectasis. Recommend chest CT to exclude an underlying mass. 3. Stable cardiomegaly. Electronically signed by: Lynwood Seip MD 08/15/2024 04:37 PM EST RP Workstation: [id number]     .Critical Care  Performed by: Daralene Lonni BIRCH, PA-C Authorized by: Daralene Lonni BIRCH, PA-C   Critical care provider statement:    Critical care time (minutes):  35   Critical care was necessary to treat or prevent imminent or life-threatening deterioration of the following conditions:  Respiratory failure   Critical care was time spent personally by me on the following activities:  Development of treatment plan with patient or surrogate,  discussions with consultants, evaluation of patient's response to treatment, examination of patient, ordering and review of laboratory studies, ordering and review of radiographic studies, ordering and performing treatments and interventions, pulse oximetry, re-evaluation of patient's condition and review of old charts   I assumed direction of critical care for this patient from another provider in my specialty: no     Care discussed with: admitting provider   CHEST TUBE INSERTION  Date/Time: 08/15/2024 10:01 PM  Performed by: Daralene Lonni BIRCH, PA-C Authorized by: Daralene Lonni BIRCH, PA-C   Consent:  Consent obtained:  Verbal   Consent given by:  Patient   Risks, benefits, and alternatives were discussed: yes     Risks discussed:  Bleeding, incomplete drainage, nerve damage, damage to surrounding structures, infection and pain   Alternatives discussed:  No treatment, delayed treatment and alternative treatment Universal protocol:    Procedure explained and questions answered to patient or proxy's satisfaction: yes     Relevant documents present and verified: yes     Test results available: yes     Imaging studies available: yes     Site/side marked: yes     Immediately prior to procedure, a time out was called: yes     Patient identity confirmed:  Verbally with patient, arm band, provided demographic data and hospital-assigned identification number Pre-procedure details:    Skin preparation:  Chlorhexidine  Sedation:    Sedation type:  None Anesthesia:    Anesthesia method:  Local infiltration   Local anesthetic:  Lidocaine  1% w/o epi Procedure details:    Placement location:  R lateral   Scalpel size:  11   Tube size (Fr):  Minicatheter   Ultrasound guidance: no     Tension pneumothorax: no     Tube connected to:  Suction   Drainage characteristics:  Serosanguinous   Suture material:  0 silk   Dressing:  4x4 sterile gauze Post-procedure details:    Post-insertion  x-ray findings: tube in good position     Procedure completion:  Tolerated well, no immediate complications    Medications Ordered in the ED  doxycycline (VIBRAMYCIN) 100 mg in sodium chloride  0.9 % 250 mL IVPB (100 mg Intravenous New Bag/Given 08/15/24 1755)  methylPREDNISolone  sodium succinate (SOLU-MEDROL ) 125 mg/2 mL injection 125 mg (125 mg Intravenous Given 08/15/24 1550)  ipratropium-albuterol  (DUONEB) 0.5-2.5 (3) MG/3ML nebulizer solution 3 mL (3 mLs Nebulization Given 08/15/24 1554)  ipratropium-albuterol  (DUONEB) 0.5-2.5 (3) MG/3ML nebulizer solution 3 mL (3 mLs Nebulization Given 08/15/24 1553)  cefTRIAXone  (ROCEPHIN ) 2 g in sodium chloride  0.9 % 100 mL IVPB (0 g Intravenous Stopped 08/15/24 1750)  iohexol  (OMNIPAQUE ) 300 MG/ML solution 75 mL (75 mLs Intravenous Contrast Given 08/15/24 1715)  fentaNYL  (SUBLIMAZE ) injection 50 mcg (50 mcg Intravenous Given 08/15/24 1820)                                    Medical Decision Making Amount and/or Complexity of Data Reviewed Labs: ordered. Radiology: ordered.  Risk Prescription drug management. Decision regarding hospitalization.   This patient presents to the ED for concern of cough, shortness of breath, wheezing, this involves an extensive number of treatment options, and is a complaint that carries with it a high risk of complications and morbidity.  The differential diagnosis includes COPD, CHF, ACS, pneumonia, acute viral syndrome   Co morbidities that complicate the patient evaluation  COPD, CHF   Additional history obtained:  Additional history obtained from medical records External records from outside source obtained and reviewed including medical records   Lab Tests:  I Ordered, and personally interpreted labs.  The pertinent results include: No leukocytosis, no anemia, mild elevation in creatinine, normal electrolytes, normal liver function, negative troponin, elevated BNP, negative viral swab   Imaging Studies  ordered:  I ordered imaging studies including chest x-ray, CT scan chest I independently visualized and interpreted imaging which showed right sided pleural effusion, right sided pneumothorax I agree with the radiologist interpretation   Cardiac  Monitoring: / EKG:  The patient was maintained on a cardiac monitor.  I personally viewed and interpreted the cardiac monitored which showed an underlying rhythm of: Normal sinus rhythm, no ST/T wave changes, no ischemic changes, no STEMI   Consultations Obtained:  I requested consultation with the pulmonology, Dr. Gretta,  and discussed lab and imaging findings as well as pertinent plan - they recommend: Chest tube placement, admission at [facility]   Problem List / ED Course / Critical interventions / Medication management  Patient is doing well at this time and does remain stable. Chest tube was place to the right side under the guidance of attending physician.  Patient tolerated procedure well with no immediate complications.  Repeat chest x-ray demonstrate chest tube in place with resolution of hydropneumothorax.  Additional breathing treatments was given and he is tolerating his oxygen  saturations well at this time.  Patient was given antibiotics given his COPD exacerbation.  Low suspicion for ACS or pulmonary embolus in this patient.  Have discussed patient case with Dr. Adefeso with the hospitalist service who has excepted for admission. I ordered medication including DuoNeb, Solu-Medrol , Rocephin , azithromycin  for COPD exacerbation, possible pneumonia Reevaluation of the patient after these medicines showed that the patient improved I have reviewed the patients home medicines and have made adjustments as needed   Social Determinants of Health:  None   Test / Admission - Considered:  Admission     Final diagnoses:  None    ED Discharge Orders     None          Daralene Lonni BIRCH, PA-C 08/15/24 2206    Daralene Lonni BIRCH, PA-C 08/15/24 2208    Suzette Pac, MD 08/16/24 1652

## 2024-08-15 NOTE — H&P (Addendum)
 History and Physical    Patient: Mark Cannon FMW:[id number] DOB: 21-Feb-1951 DOA: 08/15/2024 DOS: the patient was seen and examined on 08/15/2024 PCP: Leonce Lucie PARAS, PA-C  Patient coming from: Home  Chief Complaint:  Chief Complaint  Patient presents with   Shortness of Breath   HPI: GERAD Cannon is a 73 y.o. male with medical history significant of hypertension, hyperlipidemia, obesity, OSA not on CPAP, prostate cancer (S/P prostatectomy), tobacco abuse who presents to the emergency department from home via EMS due to 2-day onset of worsening shortness of breath.  He went to an urgent care where he was started on Z-Pak, but today, shortness of breath worsened, so he activated EMS, on arrival of EMS team, was noted to have an O2 sat of 66% on room air, he was placed on 4 LPM of oxygen  via [place] and taken to the ED.  He denies fever, chills, nausea, vomiting, chest pain.  ED course In the emergency department, BP was 120/50, O2 sat was 96% on 4 LPM of oxygen , other vital signs were within normal range.  Workup in the ED showed normal CBC and BMP except for CO2 of 34, blood glucose 254, creatinine 1.45 (baseline creatinine at 1.0-1.2) calcium  8.8, proBNP 1,492, troponin < 15.  Influenza A, B, SARS coronavirus 2, RSV was negative. Chest x-ray showed small right apical pneumothorax, new from prior exam and right lower lobe opacity with associated effusion, suspicious for pneumonia or atelectasis. CT chest with contrast showed right-sided hydropneumothorax.  Dependent consolidation within right lung, most pronounced within the right lower lobe with associated volume loss favor atelectasis over pneumonia. He was initially treated with IV ceftriaxone  and doxycycline due to presumed pneumonia based on chest x-ray findings, but CT of chest ruled these out.  Patient was treated with fentanyl  and, breathing treatment was provided, IV Solu-Medrol  azithromycin  milligram x 1 was given. Pulmonology (Dr.  Gretta) was consulted and recommended admitting patient to [facility] service at [facility] with plan for PCCM to consult on patient on arrival to [facility].   Review of Systems: As mentioned in the history of present illness. All other systems reviewed and are negative.  Past Medical History:  Diagnosis Date   Arthritis    Cancer [facility])    Prostate, has had prostate removed, has had radiation and still has cancer,   Chronic respiratory failure (HCC)    Colon polyps    Cough    Diabetes mellitus without complication (HCC)    Dyspnea    GERD (gastroesophageal reflux disease)    occasional    H/O tooth extraction week of 03/15/2015    History of kidney stones    Hypercholesteremia    Hypertension    Lumbar stenosis with neurogenic claudication    Pneumonia    Pre-diabetes    Renal disorder    Sleep apnea    No cpap   Past Surgical History:  Procedure Laterality Date   APPENDECTOMY     BACK SURGERY     BIOPSY  12/03/2022   Procedure: BIOPSY;  Surgeon: Eartha Angelia Sieving, MD;  [facility];  Service: Gastroenterology;;   CHOLECYSTECTOMY     COLONOSCOPY  02/04/2012   Procedure: COLONOSCOPY;  Surgeon: Oneil DELENA Budge, MD;  [facility];  Service: Gastroenterology;  Laterality: N/A;   COLONOSCOPY N/A 09/22/2018   Procedure: COLONOSCOPY;  Surgeon: Budge Oneil, MD;  [facility];  Service: Gastroenterology;  Laterality: N/A;   COLONOSCOPY W/ POLYPECTOMY  COLONOSCOPY WITH PROPOFOL  N/A 10/15/2022   Procedure: COLONOSCOPY WITH PROPOFOL ;  Surgeon: Cindie Carlin POUR, DO;  [facility];  Service: Endoscopy;  Laterality: N/A;   COLONOSCOPY WITH PROPOFOL  N/A 12/03/2022   Procedure: COLONOSCOPY WITH PROPOFOL ;  Surgeon: Eartha Angelia Sieving, MD;  [facility];  Service: Gastroenterology;  Laterality: N/A;  12:00pm, asa 3   ENTEROSCOPY N/A 12/03/2022   Procedure: ENTEROSCOPY;  Surgeon: Eartha Angelia Sieving, MD;  [facility];   Service: Gastroenterology;  Laterality: N/A;  push enteroscopy   ESOPHAGOGASTRODUODENOSCOPY (EGD) WITH PROPOFOL  N/A 10/15/2022   Procedure: ESOPHAGOGASTRODUODENOSCOPY (EGD) WITH PROPOFOL ;  Surgeon: Cindie Carlin POUR, DO;  [facility];  Service: Endoscopy;  Laterality: N/A;   GIVENS CAPSULE STUDY N/A 11/14/2022   Procedure: GIVENS CAPSULE STUDY;  Surgeon: Shaaron Lamar HERO, MD;  [facility];  Service: Endoscopy;  Laterality: N/A;   HEMOSTASIS CLIP PLACEMENT  12/03/2022   Procedure: HEMOSTASIS CLIP PLACEMENT;  Surgeon: Eartha Angelia Sieving, MD;  [facility];  Service: Gastroenterology;;   HERNIA REPAIR     Bilateral inguinal hernia repair X 4   HOT HEMOSTASIS  12/03/2022   Procedure: HOT HEMOSTASIS (ARGON PLASMA COAGULATION/BICAP);  Surgeon: Eartha Angelia, Sieving, MD;  [facility];  Service: Gastroenterology;;   hydrocelectomy     left knee arthroscopy     LITHOTRIPSY     LYMPHADENECTOMY Bilateral 03/22/2015   Procedure: PELVIC LYMPHADENECTOMY;  Surgeon: Ricardo Likens, MD;  Location: [facility];  Service: Urology;  Laterality: Bilateral;   NM MYOVIEW  LTD  2011   POLYPECTOMY  09/22/2018   Procedure: POLYPECTOMY;  Surgeon: Mavis Anes, MD;  [facility];  Service: Gastroenterology;;   POLYPECTOMY  10/15/2022   Procedure: POLYPECTOMY;  Surgeon: Cindie Carlin POUR, DO;  [facility];  Service: Endoscopy;;   POLYPECTOMY  12/03/2022   Procedure: POLYPECTOMY;  Surgeon: Eartha Angelia Sieving, MD;  [facility];  Service: Gastroenterology;;   ROBOT ASSISTED LAPAROSCOPIC RADICAL PROSTATECTOMY N/A 03/22/2015   Procedure: ROBOTIC ASSISTED LAPAROSCOPIC RADICAL PROSTATECTOMY WITH INDOCYANINE GREEN DYE INJECTION;  Surgeon: Ricardo Likens, MD;  Location: [facility];  Service: Urology;  Laterality: N/A;   ROBOTIC ASSISTED LAPAROSCOPIC LYSIS OF ADHESION  03/22/2015   Procedure: ROBOTIC ASSISTED LAPAROSCOPIC LYSIS OF ADHESION;   Surgeon: Ricardo Likens, MD;  Location: [facility];  Service: Urology;;   SHOULDER SURGERY Right    SUBMUCOSAL LIFTING INJECTION  12/03/2022   Procedure: SUBMUCOSAL LIFTING INJECTION;  Surgeon: Eartha Angelia Sieving, MD;  [facility];  Service: Gastroenterology;;   VASECTOMY     Social History:  reports that he has quit smoking. His smoking use included cigarettes. He has a 55 pack-year smoking history. He has been exposed to tobacco smoke. He has never used smokeless tobacco. He reports that he does not currently use alcohol after a past usage of about 1.0 standard drink of alcohol per week. He reports that he does not use drugs.  Allergies  Allergen Reactions   Lidocaine  Hives   Benzocaine Other (See Comments)    Blisters    Other Other (See Comments)    Novocaine - blisters in mouth    Family History  Problem Relation Age of Onset   Heart failure Father    Stroke Father    Colon cancer Neg Hx     Prior to Admission medications   Medication Sig Start Date End Date Taking? Authorizing Provider  ALPRAZolam (XANAX) 0.5 MG tablet  Take 0.5 mg by mouth at bedtime as needed for anxiety or sleep. 06/17/24   [provider]  azithromycin  (ZITHROMAX ) 250 MG tablet Take 250 mg by mouth as directed. 08/12/24   [provider]  bisoprolol  (ZEBETA ) 10 MG tablet Take 10 mg by mouth daily.    [provider]  Dapagliflozin Propanediol (FARXIGA PO) Take by mouth.    [provider]  docusate sodium  (COLACE) 100 MG capsule Take 100 mg by mouth daily.    [provider]  famotidine  (PEPCID ) 20 MG tablet Take 20 mg by mouth daily. 10/01/23   [provider]  ferrous sulfate  325 (65 FE) MG tablet Take 1 tablet (325 mg total) by mouth 3 (three) times daily with meals. Patient taking differently: Take 325 mg by mouth daily with breakfast. 11/15/22 07/28/23  Willette Adriana LABOR, MD  furosemide  (LASIX ) 40 MG tablet Take 1 tablet (40 mg total) by  mouth daily as needed (swelling or shortness of breath). 05/02/23   Mallipeddi, Vishnu P, MD  glimepiride  (AMARYL ) 1 MG tablet Take 1 mg by mouth 2 (two) times daily. 09/18/22   [provider]  HYDROcodone -acetaminophen  (NORCO) 10-325 MG tablet Take 1 tablet by mouth 4 (four) times daily as needed for moderate pain (pain score 4-6). Takes 1/2 tablet 10/25/22   [provider]  ondansetron  (ZOFRAN ) 4 MG tablet Take 4 mg by mouth every 8 (eight) hours as needed. 10/01/23   [provider]  OXYGEN  Inhale 3 L into the lungs at bedtime.    [provider]  pantoprazole  (PROTONIX ) 40 MG tablet Take 1 tablet (40 mg total) by mouth daily. 08/09/22   Ricky Fines, MD  rosuvastatin  (CRESTOR ) 40 MG tablet TAKE ONE TABLET (40MG  TOTAL) BY MOUTH DAILY 02/09/24   Mallipeddi, Vishnu P, MD  traMADol (ULTRAM) 50 MG tablet Take 50 mg by mouth every 6 (six) hours as needed. 06/17/24   [provider]  valsartan  (DIOVAN ) 80 MG tablet TAKE ONE TABLET (80 MG TOTAL) BY MOUTH DAILY. 04/03/24   [name], MD  Vitamin D , Ergocalciferol , 50000 units CAPS Take 1 capsule by mouth once a week. 06/04/23   [provider]    Physical Exam: Vitals:   08/15/24 2000 08/15/24 2015 08/15/24 2030 08/15/24 2045  BP: (!) 173/84 (!) 168/78 (!) 152/112 (!) 155/126  Pulse: 73 76 76 77  Resp: 16 13 18 19   Temp:      TempSrc:      SpO2: 93% 94% 90% 90%  Weight:      Height:       General: Elderly male. Awake and alert and oriented x3. Not in any acute distress.  HEENT: NCAT.  PERRLA. EOMI. Sclerae anicteric.  Moist mucosal membranes. Neck: Neck supple without lymphadenopathy. No carotid bruits. No masses palpated.  Cardiovascular: Regular rate with normal S1-S2 sounds. No murmurs, rubs or gallops auscultated. No JVD.  Respiratory: Clear breath sounds.  No accessory muscle use. Abdomen: Soft, nontender, nondistended. Active bowel sounds. No masses or hepatosplenomegaly  Skin: No  rashes, lesions, or ulcerations.  Dry, warm to touch. Musculoskeletal: Chest tube insertion site noted.  2+ dorsalis pedis and radial pulses. Good ROM.  No contractures  Psychiatric: Intact judgment and insight.  Mood appropriate to current condition. Neurologic: No focal neurological deficits. Strength is 5/5 x 4.  CN II - XII grossly intact.   Data Reviewed: EKG personally reviewed showed normal sinus rhythm at rate of 69 beats per minute  Assessment and Plan: Hydropneumothorax CT chest with contrast showed right-sided hydropneumothorax Chest tube was placed PCCM (Dr. Gretta) was consulted and recommended admitting patient to [facility] service at [facility] with plan for PCCM team to consult on patient on arrival to [facility]  Acute exacerbation of COPD Breathing treatment with DuoNeb and an IV Solu-Medrol  25 mg x 1 was given in the ED Continue duo nebs, Mucinex, Solu-Medrol , azithromycin . Continue Protonix  to prevent steroid-induced ulcer Continue incentive spirometry and flutter valve  Acute respiratory failure with hypoxia possibly due to above 2 Continue supplemental oxygen  to maintain O2 sat > 92% with plan to wean patient off oxygen  as tolerated  Acute kidney injury Creatinine 1.45 (baseline creatinine at 1.0-1.2) Continue gentle hydration Renally adjust medications, avoid nephrotoxic agents/dehydration/hypotension  Elevated proBNP, r/o acute on chronic diastolic CHF proBNP 1,492 Continue total input/output, daily weights and fluid restriction Continue heart healthy/carb modified diet  Echocardiogram done on 05/15/2022 showed LVEF of 60 to 65%, no WMA, G1 DD.  Echocardiogram in the morning   Hypocalcemia Calcium  8.8, continue Os-Cal  Type 2 diabetes mellitus with hyperglycemia A1c on 10/12/2022 was 7.2 Continue ISS and hypoglycemia protocol Continue Farxiga, but hold glimepiride  at this time  Essential hypertension Continue Zebeta  and Avapro   Mixed hyperlipidemia Continue  Crestor   GERD Continue Pepcid    Advance Care Planning: Full code  Consults: PCCM (Dr. Gretta) by [facility]  Family Communication: None at bedside  Severity of Illness: The appropriate patient status for this patient is INPATIENT. Inpatient status is judged to be reasonable and necessary in order to provide the required intensity of service to ensure the patient's safety. The patient's presenting symptoms, physical exam findings, and initial radiographic and laboratory data in the context of their chronic comorbidities is felt to place them at high risk for further clinical deterioration. Furthermore, it is not anticipated that the patient will be medically stable for discharge from the hospital within 2 midnights of admission.   * I certify that at the point of admission it is my clinical judgment that the patient will require inpatient hospital care spanning beyond 2 midnights from the point of admission due to high intensity of service, high risk for further deterioration and high frequency of surveillance required.*  Author: Brynja Marker, DO 08/15/2024 8:55 PM  For on call review [email or web address].

## 2024-08-15 NOTE — Progress Notes (Signed)
   PCCM transfer request    Sending physician: [facility]  Sending facility: [facility]  Reason for transfer: hydropneumothorax  Brief case summary: AE COPD, hydropneumothorax; small pneumothorax anteriorly.   Recommendations made prior to transfer: needs chest tube before transport due to risk of expansion with COPD AE; ok to put tube into pleural effusion if needed  Transfer accepted: Admit to [facility]; PCCM will see in consultation. Ok for [employer] or [employer].    Mark Cannon 08/15/24 6:06 PM [facility]  For contact information, see Amion. If no response to pager, please call PCCM consult pager. After hours, 7PM- 7AM, please call Elink.

## 2024-08-15 NOTE — ED Triage Notes (Signed)
 Pt c/o sob x 2 days and was seen at urgent care for the same and started on z pack. When ems arrived pt was 66% on room air and they placed pt on 4lpm of oxygen  and transported here.

## 2024-08-15 NOTE — ED Notes (Signed)
Updated patient's wife on status.  

## 2024-08-16 ENCOUNTER — Inpatient Hospital Stay (HOSPITAL_COMMUNITY)

## 2024-08-16 DIAGNOSIS — J948 Other specified pleural conditions: Secondary | ICD-10-CM

## 2024-08-16 DIAGNOSIS — Z4682 Encounter for fitting and adjustment of non-vascular catheter: Secondary | ICD-10-CM | POA: Diagnosis not present

## 2024-08-16 DIAGNOSIS — N179 Acute kidney failure, unspecified: Secondary | ICD-10-CM | POA: Diagnosis not present

## 2024-08-16 DIAGNOSIS — J441 Chronic obstructive pulmonary disease with (acute) exacerbation: Secondary | ICD-10-CM | POA: Diagnosis not present

## 2024-08-16 DIAGNOSIS — J939 Pneumothorax, unspecified: Secondary | ICD-10-CM | POA: Diagnosis not present

## 2024-08-16 DIAGNOSIS — J9601 Acute respiratory failure with hypoxia: Secondary | ICD-10-CM | POA: Diagnosis not present

## 2024-08-16 DIAGNOSIS — I5031 Acute diastolic (congestive) heart failure: Secondary | ICD-10-CM

## 2024-08-16 DIAGNOSIS — Z87891 Personal history of nicotine dependence: Secondary | ICD-10-CM

## 2024-08-16 LAB — LACTATE DEHYDROGENASE: LDH: 180 U/L (ref 98–192)

## 2024-08-16 LAB — ECHOCARDIOGRAM COMPLETE
AR max vel: 2.2 cm2
AV Area VTI: 2.43 cm2
AV Area mean vel: 2.29 cm2
AV Mean grad: 4 mmHg
AV Peak grad: 9.7 mmHg
Ao pk vel: 1.56 m/s
Area-P 1/2: 2.88 cm2
Height: 67.5 in
MV VTI: 1.91 cm2
S' Lateral: 3.1 cm
Single Plane A2C EF: 67.3 %
Weight: 3760.17 [oz_av]

## 2024-08-16 LAB — PROTEIN, TOTAL: Total Protein: 6.4 g/dL — ABNORMAL LOW (ref 6.5–8.1)

## 2024-08-16 MED ORDER — ENOXAPARIN SODIUM 40 MG/0.4ML IJ SOSY
40.0000 mg | PREFILLED_SYRINGE | INTRAMUSCULAR | Status: DC
  Administered 2024-08-16 – 2024-08-21 (×6): 40 mg via SUBCUTANEOUS
  Filled 2024-08-16 (×6): qty 0.4

## 2024-08-16 MED ORDER — PREDNISONE 20 MG PO TABS
40.0000 mg | ORAL_TABLET | Freq: Every day | ORAL | Status: DC
  Administered 2024-08-17 – 2024-08-18 (×2): 40 mg via ORAL
  Filled 2024-08-16 (×3): qty 2

## 2024-08-16 MED ORDER — METHYLPREDNISOLONE SODIUM SUCC 125 MG IJ SOLR
80.0000 mg | Freq: Every day | INTRAMUSCULAR | Status: DC
  Administered 2024-08-16: 80 mg via INTRAVENOUS
  Filled 2024-08-16: qty 2

## 2024-08-16 MED ORDER — HYDROCOD POLI-CHLORPHE POLI ER 10-8 MG/5ML PO SUER
5.0000 mL | Freq: Two times a day (BID) | ORAL | Status: DC | PRN
  Administered 2024-08-16 – 2024-08-19 (×4): 5 mL via ORAL
  Filled 2024-08-16 (×4): qty 5

## 2024-08-16 MED ORDER — OXYCODONE HCL 5 MG PO TABS
5.0000 mg | ORAL_TABLET | ORAL | Status: DC | PRN
  Administered 2024-08-21: 5 mg via ORAL
  Filled 2024-08-16: qty 1

## 2024-08-16 MED ORDER — IPRATROPIUM-ALBUTEROL 0.5-2.5 (3) MG/3ML IN SOLN
3.0000 mL | Freq: Two times a day (BID) | RESPIRATORY_TRACT | Status: DC
  Administered 2024-08-16 – 2024-08-17 (×2): 3 mL via RESPIRATORY_TRACT
  Filled 2024-08-16 (×2): qty 3

## 2024-08-16 MED ORDER — ACETAMINOPHEN 325 MG PO TABS: 650.0000 mg | ORAL_TABLET | Freq: Four times a day (QID) | ORAL | Status: DC | PRN

## 2024-08-16 MED ORDER — OXYCODONE HCL 5 MG PO TABS: 2.5000 mg | ORAL_TABLET | ORAL | Status: DC | PRN

## 2024-08-16 NOTE — Plan of Care (Signed)
  Problem: Education: Goal: Knowledge of General Education information will improve Description: Including pain rating scale, medication(s)/side effects and non-pharmacologic comfort measures Outcome: Progressing   Problem: Health Behavior/Discharge Planning: Goal: Ability to manage health-related needs will improve Outcome: Progressing   Problem: Clinical Measurements: Goal: Will remain free from infection Outcome: Progressing Goal: Diagnostic test results will improve Outcome: Progressing Goal: Respiratory complications will improve Outcome: Progressing   Problem: Coping: Goal: Level of anxiety will decrease Outcome: Progressing   Problem: Elimination: Goal: Will not experience complications related to bowel motility Outcome: Progressing

## 2024-08-16 NOTE — Progress Notes (Signed)
 NAME:  Mark Cannon, MRN:  [id number], DOB:  03/11/1951, LOS: 1 ADMISSION DATE:  08/15/2024, CONSULTATION DATE:  08/16/24 REFERRING MD:  TRH, CHIEF COMPLAINT:  chest tube management   History of Present Illness:  73 yo male transferred from [facility] after 2 days of sob. Pt was see at urgent care 2 days ago and prescribed azithromycin . This did not provide any benefit per pt and today as his sob worsened he called EMS. Upon their arrival pt was noted to have oxygen  saturation of 66% on RA. There is no reports of fever/chills/recent sick contacts, no n/v/d. Viral panel negative. Chest imaging revealed R apical ptx with RLL opacity/effusion. CT chest with R hydropneumothorax.   Pt was subsequently transferred to [facility] and ccm was consulted for chest tube management.   Pertinent  Medical History  HTN Hyperlipidemia Obesity Osa (not on cpap) Prostate cancer (s/p surgery) Tobacco abuse  Significant Hospital Events: Including procedures, antibiotic start and stop dates in addition to other pertinent events   Admitted to hospitalist service after chest tube placement 11/2 Transferred to [facility] 11/3  Interim History / Subjective:    Objective    Blood pressure (!) 128/35, pulse 73, temperature 97.7 F (36.5 C), temperature source Oral, resp. rate (!) 21, height 5' 7.5 (1.715 m), weight 106.6 kg, SpO2 94%.        Intake/Output Summary (Last 24 hours) at 08/16/2024 1714 Last data filed at 08/16/2024 0900 Gross per 24 hour  Intake 1494.21 ml  Output 2270 ml  Net -775.79 ml   Filed Weights   08/15/24 1524 08/16/24 0150  Weight: 108 kg 106.6 kg    Examination: General: nad, resting comfortably in bed HENT: ncat, perrla, eomi, mmmp Lungs: diminished R base, cta otherwise Cardiovascular: rrr Abdomen: nt, mildly distended bs + Extremities: no c/c/e Neuro: no focal deficits GU: deferred  Resolved problem list   Assessment and Plan  Acute hypoxic resp failure Acute exacerbation  COPD Hydropneumothorax Aki Cirrhosis on imaging  -titrate supplemental oxygen  as able for sat >88% -steroids, nebs, abx per primary - 770 ml output in chest tube since placement till this am, 120 cc output since am - repeat xray shows small residual pneumothorax, no air leak. Will discontinue suction. -pleural fluid is transudative with low LDH and protein. D/d include CHF or cirrhosis. Pt does reports cough and sputum production prior to this admission -chest xray in am -all other issues per primary   Updated wife over the phone   Labs   CBC: Recent Labs  Lab 08/15/24 1541  WBC 9.1  NEUTROABS 6.4  HGB 14.4  HCT 46.7  MCV 95.1  PLT 239    Basic Metabolic Panel: Recent Labs  Lab 08/15/24 1541  NA 142  K 4.2  CL 101  CO2 34*  GLUCOSE 254*  BUN 10  CREATININE 1.45*  CALCIUM  8.8*   GFR: Estimated Creatinine Clearance: 53.3 mL/min (A) (by C-G formula based on SCr of 1.45 mg/dL (H)). Recent Labs  Lab 08/15/24 1541  WBC 9.1    Liver Function Tests: Recent Labs  Lab 08/15/24 1541 08/16/24 0631  AST 15  --   ALT 10  --   ALKPHOS 65  --   BILITOT 0.5  --   PROT 7.2 6.4*  ALBUMIN  3.9  --    No results for input(s): LIPASE, AMYLASE in the last 168 hours. No results for input(s): AMMONIA in the last 168 hours.  ABG    Component Value Date/Time  HCO3 38.9 (H) 08/08/2022 1611   TCO2 30 12/01/2016 0924   O2SAT 84.4 08/08/2022 1611     Coagulation Profile: No results for input(s): INR, PROTIME in the last 168 hours.  Cardiac Enzymes: No results for input(s): CKTOTAL, CKMB, CKMBINDEX, TROPONINI in the last 168 hours.  HbA1C: Hgb A1c MFr Bld  Date/Time Value Ref Range Status  10/12/2022 10:14 AM 7.2 (H) 4.8 - 5.6 % Final    Comment:    (NOTE)         Prediabetes: 5.7 - 6.4         Diabetes: >6.4         Glycemic control for adults with diabetes: <7.0   05/14/2022 06:10 PM 7.7 (H) 4.8 - 5.6 % Final    Comment:    (NOTE) Pre  diabetes:          5.7%-6.4%  Diabetes:              >6.4%  Glycemic control for   <7.0% adults with diabetes     CBG: No results for input(s): GLUCAP in the last 168 hours.  Review of Systems:   As per HPI  Past Medical History:  He,  has a past medical history of Arthritis, Cancer (HCC), Chronic respiratory failure (HCC), Colon polyps, Cough, Diabetes mellitus without complication (HCC), Dyspnea, GERD (gastroesophageal reflux disease), H/O tooth extraction (week of 03/15/2015 ), History of kidney stones, Hypercholesteremia, Hypertension, Lumbar stenosis with neurogenic claudication, Pneumonia, Pre-diabetes, Renal disorder, and Sleep apnea.   Surgical History:   Past Surgical History:  Procedure Laterality Date   APPENDECTOMY     BACK SURGERY     BIOPSY  12/03/2022   Procedure: BIOPSY;  Surgeon: Eartha Angelia Sieving, MD;  [facility];  Service: Gastroenterology;;   CHOLECYSTECTOMY     COLONOSCOPY  02/04/2012   Procedure: COLONOSCOPY;  Surgeon: Oneil DELENA Budge, MD;  [facility];  Service: Gastroenterology;  Laterality: N/A;   COLONOSCOPY N/A 09/22/2018   Procedure: COLONOSCOPY;  Surgeon: Budge Oneil, MD;  [facility];  Service: Gastroenterology;  Laterality: N/A;   COLONOSCOPY W/ POLYPECTOMY     COLONOSCOPY WITH PROPOFOL  N/A 10/15/2022   Procedure: COLONOSCOPY WITH PROPOFOL ;  Surgeon: Cindie Carlin POUR, DO;  [facility];  Service: Endoscopy;  Laterality: N/A;   COLONOSCOPY WITH PROPOFOL  N/A 12/03/2022   Procedure: COLONOSCOPY WITH PROPOFOL ;  Surgeon: Eartha Angelia Sieving, MD;  [facility];  Service: Gastroenterology;  Laterality: N/A;  12:00pm, asa 3   ENTEROSCOPY N/A 12/03/2022   Procedure: ENTEROSCOPY;  Surgeon: Eartha Angelia Sieving, MD;  [facility];  Service: Gastroenterology;  Laterality: N/A;  push enteroscopy   ESOPHAGOGASTRODUODENOSCOPY (EGD) WITH PROPOFOL  N/A 10/15/2022   Procedure:  ESOPHAGOGASTRODUODENOSCOPY (EGD) WITH PROPOFOL ;  Surgeon: Cindie Carlin POUR, DO;  [facility];  Service: Endoscopy;  Laterality: N/A;   GIVENS CAPSULE STUDY N/A 11/14/2022   Procedure: GIVENS CAPSULE STUDY;  Surgeon: Shaaron Lamar HERO, MD;  [facility];  Service: Endoscopy;  Laterality: N/A;   HEMOSTASIS CLIP PLACEMENT  12/03/2022   Procedure: HEMOSTASIS CLIP PLACEMENT;  Surgeon: Eartha Angelia Sieving, MD;  [facility];  Service: Gastroenterology;;   HERNIA REPAIR     Bilateral inguinal hernia repair X 4   HOT HEMOSTASIS  12/03/2022   Procedure: HOT HEMOSTASIS (ARGON PLASMA COAGULATION/BICAP);  Surgeon: Eartha Angelia, Sieving, MD;  [facility];  Service: Gastroenterology;;   hydrocelectomy  left knee arthroscopy     LITHOTRIPSY     LYMPHADENECTOMY Bilateral 03/22/2015   Procedure: PELVIC LYMPHADENECTOMY;  Surgeon: Ricardo Likens, MD;  [facility]: [facility];  Service: Urology;  Laterality: Bilateral;   NM MYOVIEW  LTD  2011   POLYPECTOMY  09/22/2018   Procedure: POLYPECTOMY;  Surgeon: Mavis Anes, MD;  [facility];  Service: Gastroenterology;;   POLYPECTOMY  10/15/2022   Procedure: POLYPECTOMY;  Surgeon: Cindie Carlin POUR, DO;  [facility];  Service: Endoscopy;;   POLYPECTOMY  12/03/2022   Procedure: POLYPECTOMY;  Surgeon: Eartha Angelia Sieving, MD;  [facility];  Service: Gastroenterology;;   ROBOT ASSISTED LAPAROSCOPIC RADICAL PROSTATECTOMY N/A 03/22/2015   Procedure: ROBOTIC ASSISTED LAPAROSCOPIC RADICAL PROSTATECTOMY WITH INDOCYANINE GREEN DYE INJECTION;  Surgeon: Ricardo Likens, MD;  [facility]: [facility];  Service: Urology;  Laterality: N/A;   ROBOTIC ASSISTED LAPAROSCOPIC LYSIS OF ADHESION  03/22/2015   Procedure: ROBOTIC ASSISTED LAPAROSCOPIC LYSIS OF ADHESION;  Surgeon: Ricardo Likens, MD;  [facility];  Service: Urology;;   SHOULDER SURGERY Right    SUBMUCOSAL LIFTING INJECTION  12/03/2022    Procedure: SUBMUCOSAL LIFTING INJECTION;  Surgeon: Eartha Angelia Sieving, MD;  [facility];  Service: Gastroenterology;;   VASECTOMY       Social History:   reports that he has quit smoking. His smoking use included cigarettes. He has a 55 pack-year smoking history. He has been exposed to tobacco smoke. He has never used smokeless tobacco. He reports that he does not currently use alcohol after a past usage of about 1.0 standard drink of alcohol per week. He reports that he does not use drugs.   Family History:  His family history includes Heart failure in his father; Stroke in his father. There is no history of Colon cancer.   Allergies Allergies  Allergen Reactions   Lidocaine  Hives   Benzocaine Other (See Comments)    Blisters    Other Other (See Comments)    Novocaine - blisters in mouth     Home Medications  Prior to Admission medications   Medication Sig Start Date End Date Taking? Authorizing Provider  ALPRAZolam (XANAX) 0.5 MG tablet Take 0.5 mg by mouth at bedtime as needed for anxiety or sleep. 06/17/24   [provider]  azithromycin  (ZITHROMAX ) 250 MG tablet Take 250 mg by mouth as directed. 08/12/24   [provider]  bisoprolol  (ZEBETA ) 10 MG tablet Take 10 mg by mouth daily.    [provider]  Dapagliflozin Propanediol (FARXIGA PO) Take by mouth.    [provider]  docusate sodium  (COLACE) 100 MG capsule Take 100 mg by mouth daily.    [provider]  famotidine  (PEPCID ) 20 MG tablet Take 20 mg by mouth daily. 10/01/23   [provider]  ferrous sulfate  325 (65 FE) MG tablet Take 1 tablet (325 mg total) by mouth 3 (three) times daily with meals. Patient taking differently: Take 325 mg by mouth daily with breakfast. 11/15/22 07/28/23  Willette Adriana LABOR, MD  furosemide  (LASIX ) 40 MG tablet Take 1 tablet (40 mg total) by mouth daily as needed (swelling or shortness of breath). 05/02/23   Mallipeddi, Vishnu  P, MD  glimepiride  (AMARYL ) 1 MG tablet Take 1 mg by mouth 2 (two) times daily. 09/18/22   [provider]  HYDROcodone -acetaminophen  (NORCO) 10-325 MG tablet Take 1 tablet by mouth 4 (four) times daily as needed for moderate pain (pain score 4-6). Takes 1/2 tablet 10/25/22  [provider]  ondansetron  (ZOFRAN ) 4 MG tablet Take 4 mg by mouth every 8 (eight) hours as needed. 10/01/23   [provider]  OXYGEN  Inhale 3 L into the lungs at bedtime.    [provider]  pantoprazole  (PROTONIX ) 40 MG tablet Take 1 tablet (40 mg total) by mouth daily. 08/09/22   Ricky Fines, MD  rosuvastatin  (CRESTOR ) 40 MG tablet TAKE ONE TABLET (40MG  TOTAL) BY MOUTH DAILY 02/09/24   Mallipeddi, Vishnu P, MD  traMADol (ULTRAM) 50 MG tablet Take 50 mg by mouth every 6 (six) hours as needed. 06/17/24   [provider]  valsartan  (DIOVAN ) 80 MG tablet TAKE ONE TABLET (80 MG TOTAL) BY MOUTH DAILY. 04/03/24   [name], MD  Vitamin D , Ergocalciferol , 50000 units CAPS Take 1 capsule by mouth once a week. 06/04/23   [provider]     care time: 

## 2024-08-16 NOTE — Consult Note (Signed)
 NAME:  Mark Cannon, MRN:  [id number], DOB:  04/05/1951, LOS: 1 ADMISSION DATE:  08/15/2024, CONSULTATION DATE:  08/16/24 REFERRING MD:  TRH, CHIEF COMPLAINT:  chest tube management   History of Present Illness:  73 yo male transferred from [facility] after 2 days of sob. Pt was see at urgent care 2 days ago and prescribed azithromycin . This did not provide any benefit per pt and today as his sob worsened he called EMS. Upon their arrival pt was noted to have oxygen  saturation of 66% on RA. There is no reports of fever/chills/recent sick contacts, no n/v/d. Viral panel negative. Chest imaging revealed R apical ptx with RLL opacity/effusion. CT chest with R hydropneumothorax.   Pt was subsequently transferred to [facility] and ccm was consulted for chest tube management.   Pertinent  Medical History  HTN Hyperlipidemia Obesity Osa (not on cpap) Prostate cancer (s/p surgery) Tobacco abuse  Significant Hospital Events: Including procedures, antibiotic start and stop dates in addition to other pertinent events   Admitted to hospitalist service after chest tube placement 11/2 Transferred to [facility] 11/3  Interim History / Subjective:    Objective    Blood pressure 132/80, pulse 77, temperature 98.6 F (37 C), temperature source Oral, resp. rate 16, height 5' 7.5 (1.715 m), weight 106.6 kg, SpO2 90%.        Intake/Output Summary (Last 24 hours) at 08/16/2024 0314 Last data filed at 08/16/2024 9771 Gross per 24 hour  Intake 582.53 ml  Output 1130 ml  Net -547.47 ml   Filed Weights   08/15/24 1524 08/16/24 0150  Weight: 108 kg 106.6 kg    Examination: General: nad, resting comfortably in bed HENT: ncat, perrla, eomi, mmmp Lungs: diminished R base, cta otherwise Cardiovascular: rrr Abdomen: nt, mildly distended bs + Extremities: no c/c/e Neuro: no focal deficits GU: deferred  Resolved problem list   Assessment and Plan  Acute hypoxic resp failure Acute exacerbation  COPD Hydropneumothorax Aki -titrate supplemental oxygen  as able for sat >88% -steroids, nebs, abx per primary -maintain suction -20 -follow pleural studies -serial imaging -all other issues per primary   Labs   CBC: Recent Labs  Lab 08/15/24 1541  WBC 9.1  NEUTROABS 6.4  HGB 14.4  HCT 46.7  MCV 95.1  PLT 239    Basic Metabolic Panel: Recent Labs  Lab 08/15/24 1541  NA 142  K 4.2  CL 101  CO2 34*  GLUCOSE 254*  BUN 10  CREATININE 1.45*  CALCIUM  8.8*   GFR: Estimated Creatinine Clearance: 53.3 mL/min (A) (by C-G formula based on SCr of 1.45 mg/dL (H)). Recent Labs  Lab 08/15/24 1541  WBC 9.1    Liver Function Tests: Recent Labs  Lab 08/15/24 1541  AST 15  ALT 10  ALKPHOS 65  BILITOT 0.5  PROT 7.2  ALBUMIN  3.9   No results for input(s): LIPASE, AMYLASE in the last 168 hours. No results for input(s): AMMONIA in the last 168 hours.  ABG    Component Value Date/Time   HCO3 38.9 (H) 08/08/2022 1611   TCO2 30 12/01/2016 0924   O2SAT 84.4 08/08/2022 1611     Coagulation Profile: No results for input(s): INR, PROTIME in the last 168 hours.  Cardiac Enzymes: No results for input(s): CKTOTAL, CKMB, CKMBINDEX, TROPONINI in the last 168 hours.  HbA1C: Hgb A1c MFr Bld  Date/Time Value Ref Range Status  10/12/2022 10:14 AM 7.2 (H) 4.8 - 5.6 % Final    Comment:    (  NOTE)         Prediabetes: 5.7 - 6.4         Diabetes: >6.4         Glycemic control for adults with diabetes: <7.0   05/14/2022 06:10 PM 7.7 (H) 4.8 - 5.6 % Final    Comment:    (NOTE) Pre diabetes:          5.7%-6.4%  Diabetes:              >6.4%  Glycemic control for   <7.0% adults with diabetes     CBG: No results for input(s): GLUCAP in the last 168 hours.  Review of Systems:   As per HPI  Past Medical History:  He,  has a past medical history of Arthritis, Cancer (HCC), Chronic respiratory failure (HCC), Colon polyps, Cough, Diabetes mellitus  without complication (HCC), Dyspnea, GERD (gastroesophageal reflux disease), H/O tooth extraction (week of 03/15/2015 ), History of kidney stones, Hypercholesteremia, Hypertension, Lumbar stenosis with neurogenic claudication, Pneumonia, Pre-diabetes, Renal disorder, and Sleep apnea.   Surgical History:   Past Surgical History:  Procedure Laterality Date   APPENDECTOMY     BACK SURGERY     BIOPSY  12/03/2022   Procedure: BIOPSY;  Surgeon: Eartha Angelia Sieving, MD;  [facility];  Service: Gastroenterology;;   CHOLECYSTECTOMY     COLONOSCOPY  02/04/2012   Procedure: COLONOSCOPY;  Surgeon: Oneil DELENA Budge, MD;  [facility];  Service: Gastroenterology;  Laterality: N/A;   COLONOSCOPY N/A 09/22/2018   Procedure: COLONOSCOPY;  Surgeon: Budge Oneil, MD;  [facility];  Service: Gastroenterology;  Laterality: N/A;   COLONOSCOPY W/ POLYPECTOMY     COLONOSCOPY WITH PROPOFOL  N/A 10/15/2022   Procedure: COLONOSCOPY WITH PROPOFOL ;  Surgeon: Cindie Carlin POUR, DO;  [facility];  Service: Endoscopy;  Laterality: N/A;   COLONOSCOPY WITH PROPOFOL  N/A 12/03/2022   Procedure: COLONOSCOPY WITH PROPOFOL ;  Surgeon: Eartha Angelia Sieving, MD;  [facility];  Service: Gastroenterology;  Laterality: N/A;  12:00pm, asa 3   ENTEROSCOPY N/A 12/03/2022   Procedure: ENTEROSCOPY;  Surgeon: Eartha Angelia Sieving, MD;  [facility];  Service: Gastroenterology;  Laterality: N/A;  push enteroscopy   ESOPHAGOGASTRODUODENOSCOPY (EGD) WITH PROPOFOL  N/A 10/15/2022   Procedure: ESOPHAGOGASTRODUODENOSCOPY (EGD) WITH PROPOFOL ;  Surgeon: Cindie Carlin POUR, DO;  [facility];  Service: Endoscopy;  Laterality: N/A;   GIVENS CAPSULE STUDY N/A 11/14/2022   Procedure: GIVENS CAPSULE STUDY;  Surgeon: Shaaron Lamar HERO, MD;  [facility];  Service: Endoscopy;  Laterality: N/A;   HEMOSTASIS CLIP PLACEMENT  12/03/2022   Procedure: HEMOSTASIS CLIP  PLACEMENT;  Surgeon: Eartha Angelia Sieving, MD;  [facility];  Service: Gastroenterology;;   HERNIA REPAIR     Bilateral inguinal hernia repair X 4   HOT HEMOSTASIS  12/03/2022   Procedure: HOT HEMOSTASIS (ARGON PLASMA COAGULATION/BICAP);  Surgeon: Eartha Angelia, Sieving, MD;  [facility];  Service: Gastroenterology;;   hydrocelectomy     left knee arthroscopy     LITHOTRIPSY     LYMPHADENECTOMY Bilateral 03/22/2015   Procedure: PELVIC LYMPHADENECTOMY;  Surgeon: Ricardo Likens, MD;  [facility];  Service: Urology;  Laterality: Bilateral;   NM MYOVIEW  LTD  2011   POLYPECTOMY  09/22/2018   Procedure: POLYPECTOMY;  Surgeon: Budge Oneil, MD;  [facility];  Service: Gastroenterology;;   POLYPECTOMY  10/15/2022   Procedure: POLYPECTOMY;  Surgeon: Cindie Carlin POUR, DO;  [facility];  Service: Endoscopy;;   POLYPECTOMY  12/03/2022   Procedure: POLYPECTOMY;  Surgeon: Eartha Angelia Sieving, MD;  [facility];  Service: Gastroenterology;;   ROBOT ASSISTED LAPAROSCOPIC RADICAL PROSTATECTOMY N/A 03/22/2015   Procedure: ROBOTIC ASSISTED LAPAROSCOPIC RADICAL PROSTATECTOMY WITH INDOCYANINE GREEN DYE INJECTION;  Surgeon: Ricardo Likens, MD;  [facility];  Service: Urology;  Laterality: N/A;   ROBOTIC ASSISTED LAPAROSCOPIC LYSIS OF ADHESION  03/22/2015   Procedure: ROBOTIC ASSISTED LAPAROSCOPIC LYSIS OF ADHESION;  Surgeon: Ricardo Likens, MD;  Location: [facility];  Service: Urology;;   SHOULDER SURGERY Right    SUBMUCOSAL LIFTING INJECTION  12/03/2022   Procedure: SUBMUCOSAL LIFTING INJECTION;  Surgeon: Eartha Angelia Sieving, MD;  [facility];  Service: Gastroenterology;;   VASECTOMY       Social History:   reports that he has quit smoking. His smoking use included cigarettes. He has a 55 pack-year smoking history. He has been exposed to tobacco smoke. He has never used smokeless tobacco. He reports that he does not  currently use alcohol after a past usage of about 1.0 standard drink of alcohol per week. He reports that he does not use drugs.   Family History:  His family history includes Heart failure in his father; Stroke in his father. There is no history of Colon cancer.   Allergies Allergies  Allergen Reactions   Lidocaine  Hives   Benzocaine Other (See Comments)    Blisters    Other Other (See Comments)    Novocaine - blisters in mouth     Home Medications  Prior to Admission medications   Medication Sig Start Date End Date Taking? Authorizing Provider  ALPRAZolam (XANAX) 0.5 MG tablet Take 0.5 mg by mouth at bedtime as needed for anxiety or sleep. 06/17/24   [provider]  azithromycin  (ZITHROMAX ) 250 MG tablet Take 250 mg by mouth as directed. 08/12/24   [provider]  bisoprolol  (ZEBETA ) 10 MG tablet Take 10 mg by mouth daily.    [provider]  Dapagliflozin Propanediol (FARXIGA PO) Take by mouth.    [provider]  docusate sodium  (COLACE) 100 MG capsule Take 100 mg by mouth daily.    [provider]  famotidine  (PEPCID ) 20 MG tablet Take 20 mg by mouth daily. 10/01/23   [provider]  ferrous sulfate  325 (65 FE) MG tablet Take 1 tablet (325 mg total) by mouth 3 (three) times daily with meals. Patient taking differently: Take 325 mg by mouth daily with breakfast. 11/15/22 07/28/23  Shahmehdi, Adriana LABOR, MD  furosemide  (LASIX ) 40 MG tablet Take 1 tablet (40 mg total) by mouth daily as needed (swelling or shortness of breath). 05/02/23   Mallipeddi, Vishnu P, MD  glimepiride  (AMARYL ) 1 MG tablet Take 1 mg by mouth 2 (two) times daily. 09/18/22   [provider]  HYDROcodone -acetaminophen  (NORCO) 10-325 MG tablet Take 1 tablet by mouth 4 (four) times daily as needed for moderate pain (pain score 4-6). Takes 1/2 tablet 10/25/22   [provider]  ondansetron  (ZOFRAN ) 4 MG tablet Take 4 mg by mouth every 8 (eight) hours as  needed. 10/01/23   [provider]  OXYGEN  Inhale 3 L into the lungs at bedtime.    [provider]  pantoprazole  (PROTONIX ) 40 MG tablet Take 1 tablet (40 mg total) by mouth daily. 08/09/22   Ricky Fines, MD  rosuvastatin  (CRESTOR ) 40 MG tablet TAKE ONE TABLET (40MG  TOTAL) BY MOUTH DAILY 02/09/24  Mallipeddi, Vishnu P, MD  traMADol (ULTRAM) 50 MG tablet Take 50 mg by mouth every 6 (six) hours as needed. 06/17/24   [provider]  valsartan  (DIOVAN ) 80 MG tablet TAKE ONE TABLET (80 MG TOTAL) BY MOUTH DAILY. 04/03/24   [name], MD  Vitamin D , Ergocalciferol , 50000 units CAPS Take 1 capsule by mouth once a week. 06/04/23   [provider]     care time: 

## 2024-08-16 NOTE — Progress Notes (Signed)
 PROGRESS NOTE    Mark Cannon  FMW:[id number] DOB: [birth year] DOA: 08/15/2024 PCP: Leonce Lucie PARAS, PA-C  Chief Complaint  Patient presents with   Shortness of Breath    Brief Narrative:   73 yo gentleman with hx HTN, prostate cancer (s/p prostatectomy), OSA, dyslipidemia, tobacco abuse and other medical issues who presented with progressive shortness of breath.   Admitted with hydropneumothorax and COPD exacerbation.  Now s/p chest tube.    Assessment & Plan:   Principal Problem:   COPD exacerbation (HCC) Active Problems:   Hydropneumothorax  Acute Hypoxic Respiratory Failure Requiring 6 L [place] Due to hydropneumothorax and COPD exacerbation Wean as tolerated  Hydropneumothorax CT with R sided hydropneumothorax, consolidation in r lung (needs follow up to resolution to exclude underlying mass lesion) S/p chest tube placement 11/2 Awaiting pleural fluid studies Appreciate pccm assistance   COPD Exacerbation Improved today, no wheezing appreciated Continue steroids, azithromycin    Elevated BNP Doesn't look clinically overloaded Will follow echo Hold diuretics at this time Strict I/O, daily weights  T2DM SSI Follow a1c  CKD IIIa Will trend for clarity, but AKI appears to be ruled out based on care everywhere labs Baseline appears to be ~1.3  Cirrhosis  Noted on imaging LFTs, bili, albumin , platelets wnl.  Will follow INR.    HTN Bisoprolol , irbesartan   Dyslipidemia Crestor   GERD Pepcid      DVT prophylaxis: lovenox  Code Status: full Family Communication: none Disposition:   Status is: Inpatient Remains inpatient appropriate because: need for continued inpatient care   Consultants:  PCCM  Procedures:  11/2 chest tube insertion  Antimicrobials:  Anti-infectives (From admission, onward)    Start     Dose/Rate Route Frequency Ordered Stop   08/17/24 1000  azithromycin  (ZITHROMAX ) tablet 250 mg       Placed in Followed by Linked  Group   250 mg Oral Daily 08/15/24 2209 08/21/24 0959   08/16/24 1000  azithromycin  (ZITHROMAX ) tablet 500 mg       Placed in Followed by Linked Group   500 mg Oral Daily 08/15/24 2209 08/16/24 0825   08/15/24 1630  cefTRIAXone  (ROCEPHIN ) 2 g in sodium chloride  0.9 % 100 mL IVPB        2 g 200 mL/hr over 30 Minutes Intravenous  Once 08/15/24 1617 08/15/24 1750   08/15/24 1630  doxycycline (VIBRAMYCIN) 100 mg in sodium chloride  0.9 % 250 mL IVPB  Status:  Discontinued        100 mg 125 mL/hr over 120 Minutes Intravenous Every 12 hours 08/15/24 1617 08/16/24 1150       Subjective: C/o pain with coughing and pain related to chest tube  Objective: Vitals:   08/16/24 0738 08/16/24 0816 08/16/24 1154 08/16/24 1322  BP: (!) 145/59  (!) 157/67 (!) 142/55  Pulse: 80  78 76  Resp: 18  (!) 21   Temp: 98.7 F (37.1 C)  98.2 F (36.8 C) 98.3 F (36.8 C)  TempSrc: Oral  Oral Oral  SpO2: (!) 89% 92%  94%  Weight:      Height:        Intake/Output Summary (Last 24 hours) at 08/16/2024 1537 Last data filed at 08/16/2024 0900 Gross per 24 hour  Intake 1494.21 ml  Output 2270 ml  Net -775.79 ml   Filed Weights   08/15/24 1524 08/16/24 0150  Weight: 108 kg 106.6 kg    Examination:  General exam: Appears calm and comfortable  Respiratory system: no anterior wheezing, frequent  coug Cardiovascular system: RRR Gastrointestinal system: protuberant Central nervous system: Alert and oriented. No focal neurological deficits. Extremities: no LEE    Data Reviewed: I have personally reviewed following labs and imaging studies  CBC: Recent Labs  Lab 08/15/24 1541  WBC 9.1  NEUTROABS 6.4  HGB 14.4  HCT 46.7  MCV 95.1  PLT 239    Basic Metabolic Panel: Recent Labs  Lab 08/15/24 1541  NA 142  K 4.2  CL 101  CO2 34*  GLUCOSE 254*  BUN 10  CREATININE 1.45*  CALCIUM  8.8*    GFR: Estimated Creatinine Clearance: 53.3 mL/min (Adaja Wander) (by C-G formula based on SCr of 1.45 mg/dL  (H)).  Liver Function Tests: Recent Labs  Lab 08/15/24 1541 08/16/24 0631  AST 15  --   ALT 10  --   ALKPHOS 65  --   BILITOT 0.5  --   PROT 7.2 6.4*  ALBUMIN  3.9  --     CBG: No results for input(s): GLUCAP in the last 168 hours.   Recent Results (from the past 240 hours)  Resp panel by RT-PCR (RSV, Flu Illiana Losurdo&B, Covid) Anterior Nasal Swab     Status: None   Collection Time: 08/15/24  3:41 PM   Specimen: Anterior Nasal Swab  Result Value Ref Range Status   SARS Coronavirus 2 by RT PCR NEGATIVE NEGATIVE Final    Comment: (NOTE) SARS-CoV-2 target nucleic acids are NOT DETECTED.  The SARS-CoV-2 RNA is generally detectable in upper respiratory specimens during the acute phase of infection. The lowest concentration of SARS-CoV-2 viral copies this assay can detect is 138 copies/mL. Jamaine Quintin negative result does not preclude SARS-Cov-2 infection and should not be used as the sole basis for treatment or other patient management decisions. Richardson Dubree negative result may occur with  improper specimen collection/handling, submission of specimen other than nasopharyngeal swab, presence of viral mutation(s) within the areas targeted by this assay, and inadequate number of viral copies(<138 copies/mL). [place] negative result must be combined with clinical observations, patient history, and epidemiological information. The expected result is Negative.  Fact Sheet for Patients:  bloggercourse.com  Fact Sheet for Healthcare Providers:  seriousbroker.it  This test is no t yet approved or cleared by the [place]  FDA and  has been authorized for detection and/or diagnosis of SARS-CoV-2 by FDA under an Emergency Use Authorization (EUA). This EUA will remain  in effect (meaning this test can be used) for the duration of the COVID-19 declaration under Section 564(b)(1) of the Act, 21 U.S.C.section 360bbb-3(b)(1), unless the authorization is terminated  or  revoked sooner.       Influenza Jerie Basford by PCR NEGATIVE NEGATIVE Final   Influenza B by PCR NEGATIVE NEGATIVE Final    Comment: (NOTE) The Xpert Xpress SARS-CoV-2/FLU/RSV plus assay is intended as an aid in the diagnosis of influenza from Nasopharyngeal swab specimens and should not be used as Claudene Gatliff sole basis for treatment. Nasal washings and aspirates are unacceptable for Xpert Xpress SARS-CoV-2/FLU/RSV testing.  Fact Sheet for Patients: bloggercourse.com  Fact Sheet for Healthcare Providers: seriousbroker.it  This test is not yet approved or cleared by the [place]  FDA and has been authorized for detection and/or diagnosis of SARS-CoV-2 by FDA under an Emergency Use Authorization (EUA). This EUA will remain in effect (meaning this test can be used) for the duration of the COVID-19 declaration under Section 564(b)(1) of the Act, 21 U.S.C. section 360bbb-3(b)(1), unless the authorization is terminated or revoked.     Resp Syncytial  Virus by PCR NEGATIVE NEGATIVE Final    Comment: (NOTE) Fact Sheet for Patients: bloggercourse.com  Fact Sheet for Healthcare Providers: seriousbroker.it  This test is not yet approved or cleared by the [place]  FDA and has been authorized for detection and/or diagnosis of SARS-CoV-2 by FDA under an Emergency Use Authorization (EUA). This EUA will remain in effect (meaning this test can be used) for the duration of the COVID-19 declaration under Section 564(b)(1) of the Act, 21 U.S.C. section 360bbb-3(b)(1), unless the authorization is terminated or revoked.  Performed at [facility], [address], [place], [place] [address]          Radiology Studies: [facility] Chest [facility] 1 View Result Date: 08/15/2024 EXAM: 1 VIEW XRAY OF THE CHEST 08/15/2024 08:36:19 PM COMPARISON: 08/15/2024 CLINICAL HISTORY: post chest tube placement FINDINGS: LINES,  TUBES AND DEVICES: Interval placement of right chest tube. LUNGS AND PLEURA: No focal pulmonary opacity. No pulmonary edema. Decreasing right pleural effusion. No pneumothorax. HEART AND MEDIASTINUM: No acute abnormality of the cardiac and mediastinal silhouettes. BONES AND SOFT TISSUES: No acute osseous abnormality. IMPRESSION: 1. Interval placement of right chest tube. 2. No pneumothorax. 3. Decreasing right pleural effusion. Electronically signed by: Franky Crease MD 08/15/2024 08:42 PM EST RP Workstation: [id number]   CT Chest W Contrast Result Date: 08/15/2024 CLINICAL DATA:  Short of breath for 2 days, hypoxia, pneumothorax on earlier chest x-ray, history of prostate cancer EXAM: CT CHEST WITH CONTRAST TECHNIQUE: Multidetector CT imaging of the chest was performed during intravenous contrast administration. RADIATION DOSE REDUCTION: This exam was performed according to the departmental dose-optimization program which includes automated exposure control, adjustment of the mA and/or kV according to patient size and/or use of iterative reconstruction technique. CONTRAST:  75mL OMNIPAQUE  IOHEXOL  300 MG/ML  SOLN COMPARISON:  11/19/2023, 08/15/2024 FINDINGS: Cardiovascular: The heart is unremarkable without pericardial effusion. No evidence of thoracic aortic aneurysm or dissection. Atherosclerosis of the aorta and coronary vasculature. Mediastinum/Nodes: No enlarged mediastinal, hilar, or axillary lymph nodes. Thyroid  gland, trachea, and esophagus demonstrate no significant findings. Lungs/Pleura: Right-sided hydropneumothorax is identified. Fluid component measures less than 1 L. anterior pneumothorax component volume estimated less than 10-15%. No tension effect or midline shift. There is deep and consolidation within the right lung, most pronounced in the right lower lobe, with associated volume loss. This favors atelectasis over airspace disease. Follow-up to resolution is recommended to exclude underlying  mass. The left chest is clear.  Central airways are patent. Upper Abdomen: Cirrhotic morphology of the liver, with subtle nodularity of the liver capsule and hypertrophy of the left lobe. No acute upper abdominal findings. Musculoskeletal: No acute or destructive bony abnormalities. Reconstructed images demonstrate no additional findings. IMPRESSION: 1. Right-sided hydropneumothorax as above. Fluid component measures less than 1 L. Anterior pneumothorax component measures less than 10-15%. No tension effect or midline shift. 2. Dependent consolidation within the right lung, most pronounced within the right lower lobe, with associated volume loss. Favor atelectasis over pneumonia. Follow-up to resolution recommended to ensure resolution and exclude underlying mass lesion. 3. Aortic Atherosclerosis (ICD10-I70.0). Coronary artery atherosclerosis. 4. Hepatic cirrhosis. Electronically Signed   By: Ozell Daring M.D.   On: 08/15/2024 17:37   DG Chest Port 1 View Result Date: 08/15/2024 EXAM: 1 VIEW XRAY OF THE CHEST 08/15/2024 04:10:22 PM COMPARISON: Comparison to 11/08/2022. CLINICAL HISTORY: dyspnea dyspnea FINDINGS: LUNGS AND PLEURA: Interval development of small right apical pneumothorax. Right lower lobe opacity is noted concerning for pneumonia or atelectasis with associated effusion. CT  scan is recommended to rule out possible underlying mass. No pulmonary edema. HEART AND MEDIASTINUM: Stable cardiomegaly. BONES AND SOFT TISSUES: No acute osseous abnormality. IMPRESSION: 1. Small right apical pneumothorax, new from prior exam. 2. Right lower lobe opacity with associated effusion, suspicious for pneumonia or atelectasis. Recommend chest CT to exclude an underlying mass. 3. Stable cardiomegaly. Electronically signed by: Lynwood Seip MD 08/15/2024 04:37 PM EST RP Workstation: [id number]        Scheduled Meds:  [START ON 08/17/2024] azithromycin   250 mg Oral Daily   bisoprolol   10 mg Oral Daily   calcium   carbonate  1 tablet Oral Q breakfast   dextromethorphan-guaiFENesin  1 tablet Oral BID   enoxaparin  (LOVENOX ) injection  40 mg Subcutaneous Q24H   famotidine   20 mg Oral Daily   ipratropium-albuterol   3 mL Nebulization BID   irbesartan   75 mg Oral Daily   methylPREDNISolone  (SOLU-MEDROL ) injection  80 mg Intravenous Daily   pantoprazole   40 mg Oral Daily   rosuvastatin   40 mg Oral Daily   Continuous Infusions:   LOS: 1 day    Time spent: over 30 min     Meliton Monte, MD [facility]   To contact the attending provider between 7A-7P or the covering provider during after hours 7P-7A, please log into the web site [email or web address] and access using universal [place] password for that web site. If you do not have the password, please call the hospital operator.  08/16/2024, 3:37 PM

## 2024-08-16 NOTE — Progress Notes (Signed)
 [facility] night cross cover note:  Admitted pt from [facility] has arrived at [facility] for right hydropneumothorax, s/p chest tube at [facility]. Chest tube to suction at -20. PCCM to consult. I've notified on-call pccm of the patient's arrival.     Eva Pore, DO Hospitalist

## 2024-08-16 NOTE — ED Notes (Signed)
 Wife called and updated on pts status and transport.

## 2024-08-17 ENCOUNTER — Inpatient Hospital Stay (HOSPITAL_COMMUNITY)

## 2024-08-17 ENCOUNTER — Other Ambulatory Visit: Payer: Self-pay

## 2024-08-17 DIAGNOSIS — J9601 Acute respiratory failure with hypoxia: Secondary | ICD-10-CM | POA: Diagnosis not present

## 2024-08-17 DIAGNOSIS — K7460 Unspecified cirrhosis of liver: Secondary | ICD-10-CM

## 2024-08-17 DIAGNOSIS — J189 Pneumonia, unspecified organism: Secondary | ICD-10-CM

## 2024-08-17 DIAGNOSIS — J948 Other specified pleural conditions: Secondary | ICD-10-CM | POA: Diagnosis not present

## 2024-08-17 DIAGNOSIS — N179 Acute kidney failure, unspecified: Secondary | ICD-10-CM | POA: Diagnosis not present

## 2024-08-17 DIAGNOSIS — J441 Chronic obstructive pulmonary disease with (acute) exacerbation: Secondary | ICD-10-CM | POA: Diagnosis not present

## 2024-08-17 LAB — COMPREHENSIVE METABOLIC PANEL WITH GFR
ALT: 12 U/L (ref 0–44)
AST: 12 U/L — ABNORMAL LOW (ref 15–41)
Albumin: 2.8 g/dL — ABNORMAL LOW (ref 3.5–5.0)
Alkaline Phosphatase: 48 U/L (ref 38–126)
Anion gap: 11 (ref 5–15)
BUN: 26 mg/dL — ABNORMAL HIGH (ref 8–23)
CO2: 27 mmol/L (ref 22–32)
Calcium: 8.7 mg/dL — ABNORMAL LOW (ref 8.9–10.3)
Chloride: 99 mmol/L (ref 98–111)
Creatinine, Ser: 1.63 mg/dL — ABNORMAL HIGH (ref 0.61–1.24)
GFR, Estimated: 44 mL/min — ABNORMAL LOW (ref >60)
Glucose, Bld: 350 mg/dL — ABNORMAL HIGH (ref 70–99)
Potassium: 4.9 mmol/L (ref 3.5–5.1)
Sodium: 137 mmol/L (ref 135–145)
Total Bilirubin: 0.7 mg/dL (ref 0.0–1.2)
Total Protein: 6.6 g/dL (ref 6.5–8.1)

## 2024-08-17 LAB — GLUCOSE, CAPILLARY
Glucose-Capillary: 216 mg/dL — ABNORMAL HIGH (ref 70–99)
Glucose-Capillary: 261 mg/dL — ABNORMAL HIGH (ref 70–99)
Glucose-Capillary: 313 mg/dL — ABNORMAL HIGH (ref 70–99)

## 2024-08-17 LAB — CBC WITH DIFFERENTIAL/PLATELET
Abs Immature Granulocytes: 0.12 K/uL — ABNORMAL HIGH (ref 0.00–0.07)
Basophils Absolute: 0 K/uL (ref 0.0–0.1)
Basophils Relative: 0 %
Eosinophils Absolute: 0 K/uL (ref 0.0–0.5)
Eosinophils Relative: 0 %
HCT: 44.5 % (ref 39.0–52.0)
Hemoglobin: 13.9 g/dL (ref 13.0–17.0)
Immature Granulocytes: 1 %
Lymphocytes Relative: 7 %
Lymphs Abs: 1.1 K/uL (ref 0.7–4.0)
MCH: 29.4 pg (ref 26.0–34.0)
MCHC: 31.2 g/dL (ref 30.0–36.0)
MCV: 94.1 fL (ref 80.0–100.0)
Monocytes Absolute: 0.8 K/uL (ref 0.1–1.0)
Monocytes Relative: 5 %
Neutro Abs: 12.7 K/uL — ABNORMAL HIGH (ref 1.7–7.7)
Neutrophils Relative %: 87 %
Platelets: 245 K/uL (ref 150–400)
RBC: 4.73 MIL/uL (ref 4.22–5.81)
RDW: 16.8 % — ABNORMAL HIGH (ref 11.5–15.5)
WBC: 14.7 K/uL — ABNORMAL HIGH (ref 4.0–10.5)
nRBC: 0.1 % (ref 0.0–0.2)

## 2024-08-17 LAB — AMYLASE, PLEURAL OR PERITONEAL FLUID: Amylase, Fluid: 28 U/L

## 2024-08-17 LAB — PHOSPHORUS: Phosphorus: 3.7 mg/dL (ref 2.5–4.6)

## 2024-08-17 LAB — BODY FLUID CELL COUNT WITH DIFFERENTIAL
Eos, Fluid: 10 %
Lymphs, Fluid: 53 %
Monocyte-Macrophage-Serous Fluid: 6 % — ABNORMAL LOW (ref 50–90)
Neutrophil Count, Fluid: 28 % — ABNORMAL HIGH (ref 0–25)
Other Cells, Fluid: 3 %
Total Nucleated Cell Count, Fluid: 215 uL (ref 0–1000)

## 2024-08-17 LAB — LACTATE DEHYDROGENASE, PLEURAL OR PERITONEAL FLUID: LD, Fluid: 155 U/L — ABNORMAL HIGH (ref 3–23)

## 2024-08-17 LAB — GRAM STAIN

## 2024-08-17 LAB — ALBUMIN, PLEURAL OR PERITONEAL FLUID: Albumin, Fluid: <1.5 g/dL

## 2024-08-17 LAB — HEMOGLOBIN A1C
Hgb A1c MFr Bld: 9 % — ABNORMAL HIGH (ref 4.8–5.6)
Mean Plasma Glucose: 211.6 mg/dL

## 2024-08-17 LAB — GLUCOSE, PLEURAL OR PERITONEAL FLUID: Glucose, Fluid: 249 mg/dL

## 2024-08-17 LAB — PROTEIN, PLEURAL OR PERITONEAL FLUID: Total protein, fluid: <3 g/dL

## 2024-08-17 LAB — MAGNESIUM: Magnesium: 2.4 mg/dL (ref 1.7–2.4)

## 2024-08-17 MED ORDER — INSULIN ASPART 100 UNIT/ML IJ SOLN
0.0000 [IU] | Freq: Three times a day (TID) | INTRAMUSCULAR | Status: DC
  Administered 2024-08-17: 3 [IU] via SUBCUTANEOUS
  Administered 2024-08-17: 5 [IU] via SUBCUTANEOUS
  Administered 2024-08-18: 2 [IU] via SUBCUTANEOUS
  Administered 2024-08-18: 3 [IU] via SUBCUTANEOUS
  Administered 2024-08-18 – 2024-08-19 (×2): 5 [IU] via SUBCUTANEOUS
  Administered 2024-08-19: 3 [IU] via SUBCUTANEOUS
  Filled 2024-08-17: qty 2
  Filled 2024-08-17 (×3): qty 3
  Filled 2024-08-17 (×3): qty 5

## 2024-08-17 MED ORDER — INSULIN GLARGINE-YFGN 100 UNIT/ML ~~LOC~~ SOLN
10.0000 [IU] | Freq: Every day | SUBCUTANEOUS | Status: DC
  Administered 2024-08-17 – 2024-08-18 (×2): 10 [IU] via SUBCUTANEOUS
  Filled 2024-08-17 (×3): qty 0.1

## 2024-08-17 MED ORDER — LACTATED RINGERS IV SOLN: INTRAVENOUS | Status: DC

## 2024-08-17 MED ORDER — INSULIN ASPART 100 UNIT/ML IJ SOLN
0.0000 [IU] | Freq: Every day | INTRAMUSCULAR | Status: DC
  Administered 2024-08-17: 4 [IU] via SUBCUTANEOUS
  Administered 2024-08-18: 5 [IU] via SUBCUTANEOUS
  Filled 2024-08-17: qty 5
  Filled 2024-08-17: qty 4

## 2024-08-17 NOTE — Plan of Care (Signed)
  Problem: Education: Goal: Knowledge of General Education information will improve Description: Including pain rating scale, medication(s)/side effects and non-pharmacologic comfort measures Outcome: Progressing   Problem: Health Behavior/Discharge Planning: Goal: Ability to manage health-related needs will improve Outcome: Progressing   Problem: Clinical Measurements: Goal: Will remain free from infection Outcome: Progressing Goal: Diagnostic test results will improve Outcome: Progressing   Problem: Nutrition: Goal: Adequate nutrition will be maintained Outcome: Progressing   Problem: Coping: Goal: Level of anxiety will decrease Outcome: Progressing   

## 2024-08-17 NOTE — Progress Notes (Signed)
 NAME:  Mark Cannon, MRN:  [id number], DOB:  1951/08/15, LOS: 2 ADMISSION DATE:  08/15/2024, CONSULTATION DATE:  08/16/24 REFERRING MD:  TRH, CHIEF COMPLAINT:  chest tube management   History of Present Illness:  73 yo male transferred from [facility] after 2 days of sob. Pt was see at urgent care 2 days ago and prescribed azithromycin . This did not provide any benefit per pt and today as his sob worsened he called EMS. Upon their arrival pt was noted to have oxygen  saturation of 66% on RA. There is no reports of fever/chills/recent sick contacts, no n/v/d. Viral panel negative. Chest imaging revealed R apical ptx with RLL opacity/effusion. CT chest with R hydropneumothorax.   Pt was subsequently transferred to [place] and ccm was consulted for chest tube management.   Pertinent  Medical History  HTN Hyperlipidemia Obesity [place] (not on cpap) Prostate cancer (s/p surgery) Tobacco abuse  Significant Hospital Events: Including procedures, antibiotic start and stop dates in addition to other pertinent events   Admitted to hospitalist service after chest tube placement 11/2 Transferred to [facility] 11/3  Interim History / Subjective:  Chest tube switched to waterseal yesterday Drained 110 on right chest tube Pneumothorax stable, small and apical   Objective    Blood pressure (!) 125/54, pulse 63, temperature 98.4 F (36.9 C), temperature source Oral, resp. rate 19, height 5' 7.5 (1.715 m), weight 107.8 kg, SpO2 91%.        Intake/Output Summary (Last 24 hours) at 08/17/2024 1511 Last data filed at 08/17/2024 0419 Gross per 24 hour  Intake 480 ml  Output 1535 ml  Net -1055 ml   Filed Weights   08/15/24 1524 08/16/24 0150 08/17/24 0329  Weight: 108 kg 106.6 kg 107.8 kg    Examination: General: nad, resting comfortably in bed HENT: ncat, perrla, eomi, mmmp Lungs: diminished R base, cta otherwise Cardiovascular: rrr Abdomen: nt, mildly distended bs + Extremities: no c/c/e Neuro: no focal  deficits GU: deferred  Resolved problem list   Assessment and Plan   Acute hypoxic resp failure Acute exacerbation COPD Hydropneumothorax Aki Cirrhosis on imaging  -titrate supplemental oxygen  as able for sat >88% -steroids, nebs, abx per primary - chest tube output 110 cc overnight  - repeat xray shows small residual pneumothorax.  -family reported prior hx of effusion. Upon review of chart he had small left and moderate right effusion in 10/2022 and fluid was transudative then. Pleural studies ordered but havent sent actually sent this admission. I clamped the chest tube this morning for few hours and sent new specimen for analysis. - based on hx and elevated BNP looks like pt has underlying HFpEF. Wife reports intermittent leg swelling. -  Pt does reports cough and sputum production prior to this admission. Agree with azithromycin  and steroids for now. Can't rule our parapneumonic effusion - consider diuresis if tolerated -chest xray in am - recommend GI evaluation for cirrhosis, likely outpatient -all other issues per primary  Updated wife over the phone   Labs   CBC: Recent Labs  Lab 08/15/24 1541 08/17/24 0300  WBC 9.1 14.7*  NEUTROABS 6.4 12.7*  HGB 14.4 13.9  HCT 46.7 44.5  MCV 95.1 94.1  PLT 239 245    Basic Metabolic Panel: Recent Labs  Lab 08/15/24 1541 08/17/24 0300  NA 142 137  K 4.2 4.9  CL 101 99  CO2 34* 27  GLUCOSE 254* 350*  BUN 10 26*  CREATININE 1.45* 1.63*  CALCIUM  8.8* 8.7*  MG  --  2.4  PHOS  --  3.7   GFR: Estimated Creatinine Clearance: 47.7 mL/min (A) (by C-G formula based on SCr of 1.63 mg/dL (H)). Recent Labs  Lab 08/15/24 1541 08/17/24 0300  WBC 9.1 14.7*    Liver Function Tests: Recent Labs  Lab 08/15/24 1541 08/16/24 0631 08/17/24 0300  AST 15  --  12*  ALT 10  --  12  ALKPHOS 65  --  48  BILITOT 0.5  --  0.7  PROT 7.2 6.4* 6.6  ALBUMIN  3.9  --  2.8*   No results for input(s): LIPASE, AMYLASE in the last  168 hours. No results for input(s): AMMONIA in the last 168 hours.  ABG    Component Value Date/Time   HCO3 38.9 (H) 08/08/2022 1611   TCO2 30 12/01/2016 0924   O2SAT 84.4 08/08/2022 1611     Coagulation Profile: No results for input(s): INR, PROTIME in the last 168 hours.  Cardiac Enzymes: No results for input(s): CKTOTAL, CKMB, CKMBINDEX, TROPONINI in the last 168 hours.  HbA1C: Hgb A1c MFr Bld  Date/Time Value Ref Range Status  08/17/2024 03:00 AM 9.0 (H) 4.8 - 5.6 % Final    Comment:    (NOTE) Diagnosis of Diabetes The following HbA1c ranges recommended by the American Diabetes Association (ADA) may be used as an aid in the diagnosis of diabetes mellitus.  Hemoglobin             Suggested A1C NGSP%              Diagnosis  <5.7                   Non Diabetic  5.7-6.4                Pre-Diabetic  >6.4                   Diabetic  <7.0                   Glycemic control for                       adults with diabetes.    10/12/2022 10:14 AM 7.2 (H) 4.8 - 5.6 % Final    Comment:    (NOTE)         Prediabetes: 5.7 - 6.4         Diabetes: >6.4         Glycemic control for adults with diabetes: <7.0     CBG: Recent Labs  Lab 08/17/24 1112  GLUCAP 216*    Review of Systems:   As per HPI  Past Medical History:  He,  has a past medical history of Arthritis, Cancer (HCC), Chronic respiratory failure (HCC), Colon polyps, Cough, Diabetes mellitus without complication (HCC), Dyspnea, GERD (gastroesophageal reflux disease), H/O tooth extraction (week of 03/15/2015 ), History of kidney stones, Hypercholesteremia, Hypertension, Lumbar stenosis with neurogenic claudication, Pneumonia, Pre-diabetes, Renal disorder, and Sleep apnea.   Surgical History:   Past Surgical History:  Procedure Laterality Date   APPENDECTOMY     BACK SURGERY     BIOPSY  12/03/2022   Procedure: BIOPSY;  Surgeon: Eartha Angelia Sieving, MD;  [facility];  Service:  Gastroenterology;;   CHOLECYSTECTOMY     COLONOSCOPY  02/04/2012   Procedure: COLONOSCOPY;  Surgeon: Oneil DELENA Budge, MD;  [facility];  Service: Gastroenterology;  Laterality: N/A;  COLONOSCOPY N/A 09/22/2018   Procedure: COLONOSCOPY;  Surgeon: Mavis Anes, MD;  [facility];  Service: Gastroenterology;  Laterality: N/A;   COLONOSCOPY W/ POLYPECTOMY     COLONOSCOPY WITH PROPOFOL  N/A 10/15/2022   Procedure: COLONOSCOPY WITH PROPOFOL ;  Surgeon: Cindie Carlin POUR, DO;  [facility];  Service: Endoscopy;  Laterality: N/A;   COLONOSCOPY WITH PROPOFOL  N/A 12/03/2022   Procedure: COLONOSCOPY WITH PROPOFOL ;  Surgeon: Eartha Angelia Sieving, MD;  [facility];  Service: Gastroenterology;  Laterality: N/A;  12:00pm, asa 3   ENTEROSCOPY N/A 12/03/2022   Procedure: ENTEROSCOPY;  Surgeon: Eartha Angelia Sieving, MD;  [facility];  Service: Gastroenterology;  Laterality: N/A;  push enteroscopy   ESOPHAGOGASTRODUODENOSCOPY (EGD) WITH PROPOFOL  N/A 10/15/2022   Procedure: ESOPHAGOGASTRODUODENOSCOPY (EGD) WITH PROPOFOL ;  Surgeon: Cindie Carlin POUR, DO;  [facility];  Service: Endoscopy;  Laterality: N/A;   GIVENS CAPSULE STUDY N/A 11/14/2022   Procedure: GIVENS CAPSULE STUDY;  Surgeon: Shaaron Lamar HERO, MD;  [facility];  Service: Endoscopy;  Laterality: N/A;   HEMOSTASIS CLIP PLACEMENT  12/03/2022   Procedure: HEMOSTASIS CLIP PLACEMENT;  Surgeon: Eartha Angelia Sieving, MD;  [facility];  Service: Gastroenterology;;   HERNIA REPAIR     Bilateral inguinal hernia repair X 4   HOT HEMOSTASIS  12/03/2022   Procedure: HOT HEMOSTASIS (ARGON PLASMA COAGULATION/BICAP);  Surgeon: Eartha Angelia, Sieving, MD;  [facility];  Service: Gastroenterology;;   hydrocelectomy     left knee arthroscopy     LITHOTRIPSY     LYMPHADENECTOMY Bilateral 03/22/2015   Procedure: PELVIC LYMPHADENECTOMY;  Surgeon: Ricardo Likens, MD;  Location: [facility];  Service: Urology;  Laterality: Bilateral;   NM MYOVIEW  LTD  2011   POLYPECTOMY  09/22/2018   Procedure: POLYPECTOMY;  Surgeon: Mavis Anes, MD;  [facility];  Service: Gastroenterology;;   POLYPECTOMY  10/15/2022   Procedure: POLYPECTOMY;  Surgeon: Cindie Carlin POUR, DO;  [facility];  Service: Endoscopy;;   POLYPECTOMY  12/03/2022   Procedure: POLYPECTOMY;  Surgeon: Eartha Angelia Sieving, MD;  [facility];  Service: Gastroenterology;;   ROBOT ASSISTED LAPAROSCOPIC RADICAL PROSTATECTOMY N/A 03/22/2015   Procedure: ROBOTIC ASSISTED LAPAROSCOPIC RADICAL PROSTATECTOMY WITH INDOCYANINE GREEN DYE INJECTION;  Surgeon: Ricardo Likens, MD;  Location: [facility];  Service: Urology;  Laterality: N/A;   ROBOTIC ASSISTED LAPAROSCOPIC LYSIS OF ADHESION  03/22/2015   Procedure: ROBOTIC ASSISTED LAPAROSCOPIC LYSIS OF ADHESION;  Surgeon: Ricardo Likens, MD;  Location: [facility];  Service: Urology;;   SHOULDER SURGERY Right    SUBMUCOSAL LIFTING INJECTION  12/03/2022   Procedure: SUBMUCOSAL LIFTING INJECTION;  Surgeon: Eartha Angelia Sieving, MD;  [facility];  Service: Gastroenterology;;   VASECTOMY       Social History:   reports that he has quit smoking. His smoking use included cigarettes. He has a 55 pack-year smoking history. He has been exposed to tobacco smoke. He has never used smokeless tobacco. He reports that he does not currently use alcohol after a past usage of about 1.0 standard drink of alcohol per week. He reports that he does not use drugs.   Family History:  His family history includes Heart failure in his father; Stroke in his father. There is no history of Colon cancer.   Allergies Allergies  Allergen Reactions   Lidocaine  Hives   Benzocaine Other (See Comments)    Blisters    Other Other (See Comments)  Novocaine - blisters in mouth     Home Medications  Prior to Admission medications   Medication  Sig Start Date End Date Taking? Authorizing Provider  ALPRAZolam (XANAX) 0.5 MG tablet Take 0.5 mg by mouth at bedtime as needed for anxiety or sleep. 06/17/24   [provider]  azithromycin  (ZITHROMAX ) 250 MG tablet Take 250 mg by mouth as directed. 08/12/24   [provider]  bisoprolol  (ZEBETA ) 10 MG tablet Take 10 mg by mouth daily.    [provider]  Dapagliflozin Propanediol (FARXIGA PO) Take by mouth.    [provider]  docusate sodium  (COLACE) 100 MG capsule Take 100 mg by mouth daily.    [provider]  famotidine  (PEPCID ) 20 MG tablet Take 20 mg by mouth daily. 10/01/23   [provider]  ferrous sulfate  325 (65 FE) MG tablet Take 1 tablet (325 mg total) by mouth 3 (three) times daily with meals. Patient taking differently: Take 325 mg by mouth daily with breakfast. 11/15/22 07/28/23  Willette Adriana LABOR, MD  furosemide  (LASIX ) 40 MG tablet Take 1 tablet (40 mg total) by mouth daily as needed (swelling or shortness of breath). 05/02/23   Mallipeddi, Vishnu P, MD  glimepiride  (AMARYL ) 1 MG tablet Take 1 mg by mouth 2 (two) times daily. 09/18/22   [provider]  HYDROcodone -acetaminophen  (NORCO) 10-325 MG tablet Take 1 tablet by mouth 4 (four) times daily as needed for moderate pain (pain score 4-6). Takes 1/2 tablet 10/25/22   [provider]  ondansetron  (ZOFRAN ) 4 MG tablet Take 4 mg by mouth every 8 (eight) hours as needed. 10/01/23   [provider]  OXYGEN  Inhale 3 L into the lungs at bedtime.    [provider]  pantoprazole  (PROTONIX ) 40 MG tablet Take 1 tablet (40 mg total) by mouth daily. 08/09/22   Ricky Fines, MD  rosuvastatin  (CRESTOR ) 40 MG tablet TAKE ONE TABLET (40MG  TOTAL) BY MOUTH DAILY 02/09/24   Mallipeddi, Vishnu P, MD  traMADol (ULTRAM) 50 MG tablet Take 50 mg by mouth every 6 (six) hours as needed. 06/17/24   [provider]  valsartan  (DIOVAN ) 80 MG tablet TAKE ONE TABLET  (80 MG TOTAL) BY MOUTH DAILY. 04/03/24   [name], MD  Vitamin D , Ergocalciferol , 50000 units CAPS Take 1 capsule by mouth once a week. 06/04/23   [provider]

## 2024-08-17 NOTE — Inpatient Diabetes Management (Addendum)
 Inpatient Diabetes Program Recommendations  AACE/ADA: New Consensus Statement on Inpatient Glycemic Control   Target Ranges:  Prepandial:   less than 140 mg/dL      Peak postprandial:   less than 180 mg/dL (1-2 hours)      Critically ill patients:  140 - 180 mg/dL   Lab Results  Component Value Date   GLUCAP 140 (H) 12/03/2022   HGBA1C 7.2 (H) 10/12/2022    Latest Reference Range & Units 08/17/24 03:00  Glucose 70 - 99 mg/dL 649 (H)   Review of Glycemic Control  Diabetes history: DM2  Outpatient Diabetes medications:  Amaryl  1mg  BID   Current orders for Inpatient glycemic control: None  Inpatient Diabetes Program Recommendations:   Noted patient received Solu-medrol  80mg  on 11/3 and now receiving Prednisone  40mg  daily.   While patient is receiving steroids, please consider starting Novolog  0-9 q4hrs.   Thanks,  Lavanda Search, RN, MSN, [facility]  Inpatient Diabetes Coordinator  Pager [phone] (8a-5p)

## 2024-08-17 NOTE — Progress Notes (Addendum)
 PROGRESS NOTE    [name]  FMW:[id number] DOB: 08/02/1951 DOA: 08/15/2024 PCP: Leonce Lucie PARAS, PA-C  Chief Complaint  Patient presents with   Shortness of Breath    Brief Narrative:   73 yo gentleman with hx HTN, prostate cancer (s/p prostatectomy), OSA, dyslipidemia, tobacco abuse and other medical issues who presented with progressive shortness of breath.   Admitted with hydropneumothorax and COPD exacerbation.  Now s/p chest tube.    Assessment & Plan:   Principal Problem:   COPD exacerbation (HCC) Active Problems:   Hydropneumothorax  Acute Hypoxic Respiratory Failure Requiring 2-4 L [place] today Due to hydropneumothorax and COPD exacerbation Wean as tolerated  Hydropneumothorax CT with R sided hydropneumothorax, consolidation in r lung (needs follow up to resolution to exclude underlying mass lesion) S/p chest tube placement 11/2 Awaiting pleural fluid studies - exudative by lights.  Pending pH.  Cytology and cultures pending. Appreciate pccm assistance - chest tube per PCCM  COPD Exacerbation Improved today, no wheezing appreciated Continue steroids, azithromycin    Elevated Pro BNP Doesn't look clinically overloaded Will follow echo - preserved EF, RVSF normal, IVC normal with <50% resp variability Hold off on diuretics for now Strict I/O, daily weights  T2DM SSI, basal Follow A1c 9  AKI on CKD IIIa Will trend for clarity, but AKI appears to be ruled out based on care everywhere labs Baseline appears to be ~1.3 Mild increase to 1.6 today, trend continue irbesartan  for now, hold if worsening in AM  Cirrhosis  Noted on imaging LFTs, bili, albumin , platelets wnl.  Will follow INR.   Needs outpatient follow up  HTN Bisoprolol , irbesartan   Dyslipidemia Crestor   GERD Pepcid      DVT prophylaxis: lovenox  Code Status: full Family Communication: none Disposition:   Status is: Inpatient Remains inpatient appropriate because: need for  continued inpatient care   Consultants:  PCCM  Procedures:  11/2 chest tube insertion  Echo IMPRESSIONS     1. Left ventricular ejection fraction, by estimation, is 60 to 65%. The  left ventricle has normal function. The left ventricle has no regional  wall motion abnormalities. Left ventricular diastolic parameters were  normal.   2. Right ventricular systolic function is normal. The right ventricular  size is normal.   3. The mitral valve is normal in structure. No evidence of mitral valve  regurgitation. No evidence of mitral stenosis.   4. The aortic valve is normal in structure. There is mild calcification  of the aortic valve. Aortic valve regurgitation is not visualized. No  aortic stenosis is present.   5. The inferior vena cava is normal in size with <50% respiratory  variability, suggesting right atrial pressure of 8 mmHg.   Comparison(s): No significant difference when compared to prior echo.  Antimicrobials:  Anti-infectives (From admission, onward)    Start     Dose/Rate Route Frequency Ordered Stop   08/17/24 1000  azithromycin  (ZITHROMAX ) tablet 250 mg       Placed in Followed by Linked Group   250 mg Oral Daily 08/15/24 2209 08/21/24 0959   08/16/24 1000  azithromycin  (ZITHROMAX ) tablet 500 mg       Placed in Followed by Linked Group   500 mg Oral Daily 08/15/24 2209 08/16/24 0825   08/15/24 1630  cefTRIAXone  (ROCEPHIN ) 2 g in sodium chloride  0.9 % 100 mL IVPB        2 g 200 mL/hr over 30 Minutes Intravenous  Once 08/15/24 1617 08/15/24 1750   08/15/24 1630  doxycycline (VIBRAMYCIN) 100 mg in sodium chloride  0.9 % 250 mL IVPB  Status:  Discontinued        100 mg 125 mL/hr over 120 Minutes Intravenous Every 12 hours 08/15/24 1617 08/16/24 1150       Subjective: Drowsy this AM No complaints  Objective: Vitals:   08/17/24 0606 08/17/24 0850 08/17/24 1110 08/17/24 1311  BP:  (!) 140/56 (!) 125/54   Pulse: 64 70 (!) 56 63  Resp: 17 18 15 19    Temp:  97.7 F (36.5 C) 98.4 F (36.9 C)   TempSrc:  Oral Oral   SpO2: 93% 94% 95% 91%  Weight:      Height:        Intake/Output Summary (Last 24 hours) at 08/17/2024 1921 Last data filed at 08/17/2024 1500 Gross per 24 hour  Intake 240 ml  Output 1810 ml  Net -1570 ml   Filed Weights   08/15/24 1524 08/16/24 0150 08/17/24 0329  Weight: 108 kg 106.6 kg 107.8 kg    Examination:  General: No acute distress. Cardiovascular:RRR Lungs: unlabored, diminished, R sided chest tube Abdomen: Soft, nontender, nondistended with normal active bowel sounds. No masses. No hepatosplenomegaly. Neurological: Alert and oriented 3. Moves all extremities 4 with equal strength. Cranial nerves II through XII grossly intact. Extremities: No clubbing or cyanosis. No edema.    Data Reviewed: I have personally reviewed following labs and imaging studies  CBC: Recent Labs  Lab 08/15/24 1541 08/17/24 0300  WBC 9.1 14.7*  NEUTROABS 6.4 12.7*  HGB 14.4 13.9  HCT 46.7 44.5  MCV 95.1 94.1  PLT 239 245    Basic Metabolic Panel: Recent Labs  Lab 08/15/24 1541 08/17/24 0300  NA 142 137  K 4.2 4.9  CL 101 99  CO2 34* 27  GLUCOSE 254* 350*  BUN 10 26*  CREATININE 1.45* 1.63*  CALCIUM  8.8* 8.7*  MG  --  2.4  PHOS  --  3.7    GFR: Estimated Creatinine Clearance: 47.7 mL/min (Bostyn Bogie) (by C-G formula based on SCr of 1.63 mg/dL (H)).  Liver Function Tests: Recent Labs  Lab 08/15/24 1541 08/16/24 0631 08/17/24 0300  AST 15  --  12*  ALT 10  --  12  ALKPHOS 65  --  48  BILITOT 0.5  --  0.7  PROT 7.2 6.4* 6.6  ALBUMIN  3.9  --  2.8*    CBG: Recent Labs  Lab 08/17/24 1112 08/17/24 1553  GLUCAP 216* 261*     Recent Results (from the past 240 hours)  Resp panel by RT-PCR (RSV, Flu Samiel Peel&B, Covid) Anterior Nasal Swab     Status: None   Collection Time: 08/15/24  3:41 PM   Specimen: Anterior Nasal Swab  Result Value Ref Range Status   SARS Coronavirus 2 by RT PCR NEGATIVE NEGATIVE  Final    Comment: (NOTE) SARS-CoV-2 target nucleic acids are NOT DETECTED.  The SARS-CoV-2 RNA is generally detectable in upper respiratory specimens during the acute phase of infection. The lowest concentration of SARS-CoV-2 viral copies this assay can detect is 138 copies/mL. Zion Lint negative result does not preclude SARS-Cov-2 infection and should not be used as the sole basis for treatment or other patient management decisions. Roshawnda Pecora negative result may occur with  improper specimen collection/handling, submission of specimen other than nasopharyngeal swab, presence of viral mutation(s) within the areas targeted by this assay, and inadequate number of viral copies(<138 copies/mL). Tichina Koebel negative result must be combined with clinical observations, patient history,  and epidemiological information. The expected result is Negative.  Fact Sheet for Patients:  bloggercourse.com  Fact Sheet for Healthcare Providers:  seriousbroker.it  This test is no t yet approved or cleared by the [place]  FDA and  has been authorized for detection and/or diagnosis of SARS-CoV-2 by FDA under an Emergency Use Authorization (EUA). This EUA will remain  in effect (meaning this test can be used) for the duration of the COVID-19 declaration under Section 564(b)(1) of the Act, 21 U.S.C.section 360bbb-3(b)(1), unless the authorization is terminated  or revoked sooner.       Influenza Dearia [place] by PCR NEGATIVE NEGATIVE Final   Influenza B by PCR NEGATIVE NEGATIVE Final    Comment: (NOTE) The Xpert Xpress SARS-CoV-2/FLU/RSV plus assay is intended as an aid in the diagnosis of influenza from Nasopharyngeal swab specimens and should not be used as Angila Wombles sole basis for treatment. Nasal washings and aspirates are unacceptable for Xpert Xpress SARS-CoV-2/FLU/RSV testing.  Fact Sheet for Patients: bloggercourse.com  Fact Sheet for Healthcare  Providers: seriousbroker.it  This test is not yet approved or cleared by the [place]  FDA and has been authorized for detection and/or diagnosis of SARS-CoV-2 by FDA under an Emergency Use Authorization (EUA). This EUA will remain in effect (meaning this test can be used) for the duration of the COVID-19 declaration under Section 564(b)(1) of the Act, 21 U.S.C. section 360bbb-3(b)(1), unless the authorization is terminated or revoked.     Resp Syncytial Virus by PCR NEGATIVE NEGATIVE Final    Comment: (NOTE) Fact Sheet for Patients: bloggercourse.com  Fact Sheet for Healthcare Providers: seriousbroker.it  This test is not yet approved or cleared by the [place]  FDA and has been authorized for detection and/or diagnosis of SARS-CoV-2 by FDA under an Emergency Use Authorization (EUA). This EUA will remain in effect (meaning this test can be used) for the duration of the COVID-19 declaration under Section 564(b)(1) of the Act, 21 U.S.C. section 360bbb-3(b)(1), unless the authorization is terminated or revoked.  Performed at [facility], [address] [place], [place] [address]          Radiology Studies: DG CHEST PORT 1 VIEW Result Date: 08/17/2024 CLINICAL DATA:  Follow-up right apical trace to small pneumothorax. EXAM: PORTABLE CHEST 1 VIEW COMPARISON:  08/16/2024 FINDINGS: Stable right pigtail pleural catheter. The previously demonstrated trace to small pneumothorax is smaller with Muhammed Teutsch trace residual pneumothorax. Stable mildly enlarged cardiac silhouette and clear lungs with normal vascularity. Stable moderate to marked bilateral glenohumeral degenerative changes, thoracic spine degenerative changes and cervical spine fixation hardware. IMPRESSION: 1. Improved right apical pneumothorax with trace residual pneumothorax. 2. Stable mild cardiomegaly. Electronically Signed   By: Elspeth Bathe M.D.    On: 08/17/2024 16:48   DG CHEST PORT 1 VIEW Result Date: 08/17/2024 CLINICAL DATA:  Shortness of breath.  Evaluate pneumothorax. EXAM: PORTABLE CHEST 1 VIEW COMPARISON:  08/15/2024, 08/16/2024 and 08/17/2024. FINDINGS: Right-sided chest tube unchanged. Tiny persistent right apical pneumothorax without significant change from 08/17/2024 although slightly improved compared to 08/15/2024. Lungs are somewhat hypoinflated demonstrate minimal bibasilar opacification likely atelectasis. Cardiomediastinal silhouette and remainder of the exam is unchanged. IMPRESSION: 1. Tiny persistent right apical pneumothorax unchanged from 08/17/2024, although slightly improved compared to 08/15/2024. Right-sided chest tube unchanged. 2. Minimal bibasilar atelectasis. Electronically Signed   By: Toribio Agreste M.D.   On: 08/17/2024 14:23   DG CHEST PORT 1 VIEW Result Date: 08/16/2024 EXAM: 1 VIEW(S) XRAY OF THE CHEST 08/16/2024 05:32:00 PM COMPARISON: Chest x-ray  08/15/2024 04:05 PM. CLINICAL HISTORY: [id number] Pneumothorax [id number] Pneumothorax. FINDINGS: LINES, TUBES AND DEVICES: Right chest tube with pigtail in stable position. LUNGS AND PLEURA: Persistent stable right trace to small apical pneumothorax. No left pneumothorax. No pleural effusion. No focal pulmonary opacity. No pulmonary edema. HEART AND MEDIASTINUM: No acute abnormality of the cardiac and mediastinal silhouettes. BONES AND SOFT TISSUES: No acute osseous abnormality. IMPRESSION: 1. Persistent stable right trace to small apical pneumothorax. 2. Right chest tube with pigtail in stable position. Electronically signed by: Morgane Naveau MD 08/16/2024 06:20 PM EST RP Workstation: [id number]   ECHOCARDIOGRAM COMPLETE Result Date: 08/16/2024    ECHOCARDIOGRAM REPORT   Patient Name:   Mark Cannon Date of Exam: 08/16/2024 Medical Rec #:  [id number]      Height:       67.5 in Accession #:    [id number]     Weight:       235.0 lb Date of Birth:  [birth year]      BSA:           2.178 m Patient Age:    73 years       BP:           157/67 mmHg Patient Gender: M              HR:           74 bpm. Exam Location:  Inpatient Procedure: 2D Echo, Cardiac Doppler and Color Doppler (Both Spectral and Color            Flow Doppler were utilized during procedure). Indications:    CHF I50.31  History:        Patient has prior history of Echocardiogram examinations, most                 recent 05/15/2022. CHF and HFpEF, COPD and Stroke,                 Signs/Symptoms:Dizziness/Lightheadedness and Dyspnea; Risk                 Factors:Hypertension, Sleep Apnea, Diabetes, Chronic Kidney                 disease, Former Smoker and Dyslipidemia.  Sonographer:    BERNARDA ROCKS Referring Phys: [id number] OLADAPO ADEFESO  Sonographer Comments: Technically difficult due to patient being unable to roll onto his left side due to chest tube. IMPRESSIONS  1. Left ventricular ejection fraction, by estimation, is 60 to 65%. The left ventricle has normal function. The left ventricle has no regional wall motion abnormalities. Left ventricular diastolic parameters were normal.  2. Right ventricular systolic function is normal. The right ventricular size is normal.  3. The mitral valve is normal in structure. No evidence of mitral valve regurgitation. No evidence of mitral stenosis.  4. The aortic valve is normal in structure. There is mild calcification of the aortic valve. Aortic valve regurgitation is not visualized. No aortic stenosis is present.  5. The inferior vena cava is normal in size with <50% respiratory variability, suggesting right atrial pressure of 8 mmHg. Comparison(s): No significant difference when compared to prior echo. FINDINGS  Left Ventricle: Left ventricular ejection fraction, by estimation, is 60 to 65%. The left ventricle has normal function. The left ventricle has no regional wall motion abnormalities. The left ventricular internal cavity size was normal in size. There is  no left ventricular  hypertrophy. Left ventricular diastolic parameters were normal. Right Ventricle: The right ventricular size is  normal. No increase in right ventricular wall thickness. Right ventricular systolic function is normal. Left Atrium: Left atrial size was normal in size. Right Atrium: Right atrial size was normal in size. Pericardium: There is no evidence of pericardial effusion. Mitral Valve: The mitral valve is normal in structure. No evidence of mitral valve regurgitation. No evidence of mitral valve stenosis. MV peak gradient, 7.2 mmHg. The mean mitral valve gradient is 3.0 mmHg. Tricuspid Valve: The tricuspid valve is normal in structure. Tricuspid valve regurgitation is not demonstrated. No evidence of tricuspid stenosis. Aortic Valve: The aortic valve is normal in structure. There is mild calcification of the aortic valve. Aortic valve regurgitation is not visualized. No aortic stenosis is present. Aortic valve mean gradient measures 4.0 mmHg. Aortic valve peak gradient measures 9.7 mmHg. Aortic valve area, by VTI measures 2.43 cm. Pulmonic Valve: The pulmonic valve was not well visualized. Pulmonic valve regurgitation is not visualized. No evidence of pulmonic stenosis. Aorta: The aortic root is normal in size and structure. Venous: The inferior vena cava is normal in size with less than 50% respiratory variability, suggesting right atrial pressure of 8 mmHg. IAS/Shunts: No atrial level shunt detected by color flow Doppler.  LEFT VENTRICLE PLAX 2D LVIDd:         4.80 cm      Diastology LVIDs:         3.10 cm      LV e' medial:    6.85 cm/s LV PW:         1.00 cm      LV E/e' medial:  18.7 LV IVS:        1.20 cm      LV e' lateral:   10.60 cm/s LVOT diam:     1.80 cm      LV E/e' lateral: 12.1 LV SV:         70 LV SV Index:   32 LVOT Area:     2.54 cm  LV Volumes (MOD) LV vol d, MOD A2C: 141.0 ml LV vol s, MOD A2C: 46.1 ml LV SV MOD A2C:     94.9 ml RIGHT VENTRICLE             IVC RV Basal diam:  4.10 cm     IVC  diam: 2.00 cm RV S prime:     17.00 cm/s TAPSE (M-mode): 2.5 cm LEFT ATRIUM             Index        RIGHT ATRIUM           Index LA diam:        3.10 cm 1.42 cm/m   RA Area:     20.00 cm LA Vol (A2C):   57.1 ml 26.22 ml/m  RA Volume:   55.40 ml  25.44 ml/m LA Vol (A4C):   41.2 ml 18.92 ml/m LA Biplane Vol: 48.4 ml 22.22 ml/m  AORTIC VALVE                    PULMONIC VALVE AV Area (Vmax):    2.20 cm     PV Vmax:          1.05 m/s AV Area (Vmean):   2.29 cm     PV Peak grad:     4.4 mmHg AV Area (VTI):     2.43 cm     PR End Diast Vel: 1.76 msec AV Vmax:  156.00 cm/s AV Vmean:          90.500 cm/s AV VTI:            0.290 m AV Peak Grad:      9.7 mmHg AV Mean Grad:      4.0 mmHg LVOT Vmax:         135.00 cm/s LVOT Vmean:        81.300 cm/s LVOT VTI:          0.277 m LVOT/AV VTI ratio: 0.96  AORTA Ao Root diam: 3.20 cm Ao Asc diam:  3.30 cm MITRAL VALVE MV Area (PHT): 2.88 cm     SHUNTS MV Area VTI:   1.91 cm     Systemic VTI:  0.28 m MV Peak grad:  7.2 mmHg     Systemic Diam: 1.80 cm MV Mean grad:  3.0 mmHg MV Vmax:       1.34 m/s MV Vmean:      83.8 cm/s MV Decel Time: 263 msec MV E velocity: 128.00 cm/s MV Paxson Harrower velocity: 102.00 cm/s MV E/Celena Lanius ratio:  1.25 Franck Azobou Tonleu Electronically signed by Joelle Ren Ny Signature Date/Time: 08/16/2024/4:19:05 PM    Final    DG Chest Port 1 View Result Date: 08/15/2024 EXAM: 1 VIEW XRAY OF THE CHEST 08/15/2024 08:36:19 PM COMPARISON: 08/15/2024 CLINICAL HISTORY: post chest tube placement FINDINGS: LINES, TUBES AND DEVICES: Interval placement of right chest tube. LUNGS AND PLEURA: No focal pulmonary opacity. No pulmonary edema. Decreasing right pleural effusion. No pneumothorax. HEART AND MEDIASTINUM: No acute abnormality of the cardiac and mediastinal silhouettes. BONES AND SOFT TISSUES: No acute osseous abnormality. IMPRESSION: 1. Interval placement of right chest tube. 2. No pneumothorax. 3. Decreasing right pleural effusion. Electronically signed  by: Franky Crease MD 08/15/2024 08:42 PM EST RP Workstation: [id number]        Scheduled Meds:  azithromycin   250 mg Oral Daily   bisoprolol   10 mg Oral Daily   calcium  carbonate  1 tablet Oral Q breakfast   dextromethorphan-guaiFENesin  1 tablet Oral BID   enoxaparin  (LOVENOX ) injection  40 mg Subcutaneous Q24H   famotidine   20 mg Oral Daily   insulin  aspart  0-5 Units Subcutaneous QHS   insulin  aspart  0-9 Units Subcutaneous TID WC   irbesartan   75 mg Oral Daily   pantoprazole   40 mg Oral Daily   predniSONE   40 mg Oral Q breakfast   rosuvastatin   40 mg Oral Daily   Continuous Infusions:   LOS: 2 days    Time spent: over 30 min     Meliton Monte, MD [facility]   To contact the attending provider between 7A-7P or the covering provider during after hours 7P-7A, please log into the web site [email or web address] and access using universal [place] password for that web site. If you do not have the password, please call the hospital operator.  08/17/2024, 7:21 PM

## 2024-08-17 NOTE — Progress Notes (Signed)
 Mobility Specialist Progress Note;   08/17/24 1004  Mobility  Activity Dangled on edge of bed  Level of Assistance Moderate assist, patient does 50-74%  Assistive Device None  Distance Ambulated (ft)  (deferred)  Activity Response Tolerated fair  Mobility Referral Yes  Mobility visit 1 Mobility  Mobility Specialist Start Time (ACUTE ONLY) 1004  Mobility Specialist Stop Time (ACUTE ONLY) 1030  Mobility Specialist Time Calculation (min) (ACUTE ONLY) 26 min   Pt agreeable to mobility. On 1LO2 upon arrival. Required ModA for bed mobility. Upon changing positions from supine to sitting, pt w/ increased coughing requiring lots of suction. VSS on 2LO2 during coughing episodes. Was unable to ambulate or transfer to chair d/t this, however pt agreeable to sitting up on EoB. Bed alarm on and pt left with all needs met. RN notified.   Lauraine Erm Mobility Specialist Please contact via SecureChat or [employer] [phone]

## 2024-08-18 ENCOUNTER — Inpatient Hospital Stay (HOSPITAL_COMMUNITY)

## 2024-08-18 DIAGNOSIS — J90 Pleural effusion, not elsewhere classified: Secondary | ICD-10-CM | POA: Diagnosis not present

## 2024-08-18 DIAGNOSIS — J9811 Atelectasis: Secondary | ICD-10-CM | POA: Diagnosis not present

## 2024-08-18 DIAGNOSIS — J441 Chronic obstructive pulmonary disease with (acute) exacerbation: Secondary | ICD-10-CM | POA: Diagnosis not present

## 2024-08-18 DIAGNOSIS — J948 Other specified pleural conditions: Secondary | ICD-10-CM | POA: Diagnosis not present

## 2024-08-18 DIAGNOSIS — J939 Pneumothorax, unspecified: Secondary | ICD-10-CM | POA: Diagnosis not present

## 2024-08-18 DIAGNOSIS — N179 Acute kidney failure, unspecified: Secondary | ICD-10-CM | POA: Diagnosis not present

## 2024-08-18 DIAGNOSIS — J9601 Acute respiratory failure with hypoxia: Secondary | ICD-10-CM | POA: Diagnosis not present

## 2024-08-18 LAB — CBC WITH DIFFERENTIAL/PLATELET
Abs Immature Granulocytes: 0.06 K/uL (ref 0.00–0.07)
Basophils Absolute: 0 K/uL (ref 0.0–0.1)
Basophils Relative: 0 %
Eosinophils Absolute: 0 K/uL (ref 0.0–0.5)
Eosinophils Relative: 0 %
HCT: 45.1 % (ref 39.0–52.0)
Hemoglobin: 13.9 g/dL (ref 13.0–17.0)
Immature Granulocytes: 0 %
Lymphocytes Relative: 9 %
Lymphs Abs: 1.2 K/uL (ref 0.7–4.0)
MCH: 28.7 pg (ref 26.0–34.0)
MCHC: 30.8 g/dL (ref 30.0–36.0)
MCV: 93.2 fL (ref 80.0–100.0)
Monocytes Absolute: 0.6 K/uL (ref 0.1–1.0)
Monocytes Relative: 4 %
Neutro Abs: 11.8 K/uL — ABNORMAL HIGH (ref 1.7–7.7)
Neutrophils Relative %: 87 %
Platelets: 221 K/uL (ref 150–400)
RBC: 4.84 MIL/uL (ref 4.22–5.81)
RDW: 17 % — ABNORMAL HIGH (ref 11.5–15.5)
WBC: 13.7 K/uL — ABNORMAL HIGH (ref 4.0–10.5)
nRBC: 0 % (ref 0.0–0.2)

## 2024-08-18 LAB — BASIC METABOLIC PANEL WITH GFR
Anion gap: 8 (ref 5–15)
BUN: 32 mg/dL — ABNORMAL HIGH (ref 8–23)
CO2: 32 mmol/L (ref 22–32)
Calcium: 8.6 mg/dL — ABNORMAL LOW (ref 8.9–10.3)
Chloride: 96 mmol/L — ABNORMAL LOW (ref 98–111)
Creatinine, Ser: 1.5 mg/dL — ABNORMAL HIGH (ref 0.61–1.24)
GFR, Estimated: 49 mL/min — ABNORMAL LOW (ref >60)
Glucose, Bld: 410 mg/dL — ABNORMAL HIGH (ref 70–99)
Potassium: 4.7 mmol/L (ref 3.5–5.1)
Sodium: 136 mmol/L (ref 135–145)

## 2024-08-18 LAB — COMPREHENSIVE METABOLIC PANEL WITH GFR
ALT: 11 U/L (ref 0–44)
AST: 10 U/L — ABNORMAL LOW (ref 15–41)
Albumin: 2.8 g/dL — ABNORMAL LOW (ref 3.5–5.0)
Alkaline Phosphatase: 43 U/L (ref 38–126)
Anion gap: 10 (ref 5–15)
BUN: 27 mg/dL — ABNORMAL HIGH (ref 8–23)
CO2: 32 mmol/L (ref 22–32)
Calcium: 8.8 mg/dL — ABNORMAL LOW (ref 8.9–10.3)
Chloride: 98 mmol/L (ref 98–111)
Creatinine, Ser: 1.43 mg/dL — ABNORMAL HIGH (ref 0.61–1.24)
GFR, Estimated: 52 mL/min — ABNORMAL LOW (ref >60)
Glucose, Bld: 160 mg/dL — ABNORMAL HIGH (ref 70–99)
Potassium: 4.3 mmol/L (ref 3.5–5.1)
Sodium: 140 mmol/L (ref 135–145)
Total Bilirubin: 0.7 mg/dL (ref 0.0–1.2)
Total Protein: 6.5 g/dL (ref 6.5–8.1)

## 2024-08-18 LAB — GLUCOSE, CAPILLARY
Glucose-Capillary: 156 mg/dL — ABNORMAL HIGH (ref 70–99)
Glucose-Capillary: 254 mg/dL — ABNORMAL HIGH (ref 70–99)
Glucose-Capillary: 287 mg/dL — ABNORMAL HIGH (ref 70–99)
Glucose-Capillary: 440 mg/dL — ABNORMAL HIGH (ref 70–99)

## 2024-08-18 LAB — PROTIME-INR
INR: 1 (ref 0.8–1.2)
Prothrombin Time: 14 s (ref 11.4–15.2)

## 2024-08-18 LAB — TRIGLYCERIDES, BODY FLUIDS: Triglycerides, Fluid: 18 mg/dL

## 2024-08-18 LAB — PHOSPHORUS: Phosphorus: 3.9 mg/dL (ref 2.5–4.6)

## 2024-08-18 LAB — PROCALCITONIN: Procalcitonin: <0.1 ng/mL

## 2024-08-18 LAB — CYTOLOGY - NON PAP

## 2024-08-18 LAB — MAGNESIUM: Magnesium: 2.4 mg/dL (ref 1.7–2.4)

## 2024-08-18 MED ORDER — SODIUM CHLORIDE 0.9 % IV SOLN
2.0000 g | INTRAVENOUS | Status: DC
  Administered 2024-08-18 – 2024-08-21 (×4): 2 g via INTRAVENOUS
  Filled 2024-08-18 (×4): qty 20

## 2024-08-18 NOTE — Progress Notes (Signed)
 NAME:  Mark Cannon, MRN:  [id number], DOB:  July 01, 1951, LOS: 3 ADMISSION DATE:  08/15/2024, CONSULTATION DATE:  08/16/24 REFERRING MD:  TRH, CHIEF COMPLAINT:  chest tube management   History of Present Illness:  73 yo male transferred from [facility] after 2 days of sob. Pt was see at urgent care 2 days ago and prescribed azithromycin . This did not provide any benefit per pt and today as his sob worsened he called EMS. Upon their arrival pt was noted to have oxygen  saturation of 66% on RA. There is no reports of fever/chills/recent sick contacts, no n/v/d. Viral panel negative. Chest imaging revealed R apical ptx with RLL opacity/effusion. CT chest with R hydropneumothorax.   Pt was subsequently transferred to [facility] and ccm was consulted for chest tube management.   Pertinent  Medical History  HTN Hyperlipidemia Obesity Osa (not on cpap) Prostate cancer (s/p surgery) Tobacco abuse  Significant Hospital Events: Including procedures, antibiotic start and stop dates in addition to other pertinent events   Admitted to hospitalist service after chest tube placement 11/2 Transferred to [facility] 11/3  Interim History / Subjective:  Chest tube tubing got disconnected this am No new changes on xray 450 output in chest tube in last 24 hrs    Objective    Blood pressure (!) 127/55, pulse 82, temperature 98.3 F (36.8 C), temperature source Oral, resp. rate 15, height 5' 7.5 (1.715 m), weight 106.3 kg, SpO2 95%.        Intake/Output Summary (Last 24 hours) at 08/18/2024 1653 Last data filed at 08/18/2024 1539 Gross per 24 hour  Intake 120 ml  Output 2650 ml  Net -2530 ml   Filed Weights   08/16/24 0150 08/17/24 0329 08/18/24 0421  Weight: 106.6 kg 107.8 kg 106.3 kg    Examination: General: nad, resting comfortably in bed HENT: ncat, perrla, eomi, mmmp Lungs: diminished R base, cta otherwise Cardiovascular: rrr Abdomen: nt, mildly distended bs + Extremities: no c/c/e Neuro: no focal  deficits GU: deferred  Resolved problem list   Assessment and Plan   Acute hypoxic resp failure Acute exacerbation COPD Hydropneumothorax CAP Aki Cirrhosis on imaging  -titrate supplemental oxygen  as able for sat >88% -steroids, nebs per primary -family reported prior hx of effusion. Upon review of chart he had small left and moderate right effusion in 10/2022 and fluid was transudative then. based on hx and elevated BNP looks like pt has underlying HFpEF. Wife reports intermittent leg swelling. -  Pt does reports cough and sputum production prior to this admission. Agree with azithromycin  and steroids for now. LDH elevated on recent pleural fluid. Cultures pending. Can't rule our parapneumonic effusion. Will add ceftriaxone . Sputum culture ordered. Urine legionella and urine strep ag - continue suction on chest tube. Will plan for CT chest depending on drainage and pleural studies results over the next few days - consider diuresis if tolerated -chest xray in am - recommend GI evaluation for cirrhosis, likely outpatient -all other issues per primary  Updated wife over the phone   Labs   CBC: Recent Labs  Lab 08/15/24 1541 08/17/24 0300 08/18/24 0418  WBC 9.1 14.7* 13.7*  NEUTROABS 6.4 12.7* 11.8*  HGB 14.4 13.9 13.9  HCT 46.7 44.5 45.1  MCV 95.1 94.1 93.2  PLT 239 245 221    Basic Metabolic Panel: Recent Labs  Lab 08/15/24 1541 08/17/24 0300 08/18/24 0418  NA 142 137 140  K 4.2 4.9 4.3  CL 101 99 98  CO2 34*  27 32  GLUCOSE 254* 350* 160*  BUN 10 26* 27*  CREATININE 1.45* 1.63* 1.43*  CALCIUM  8.8* 8.7* 8.8*  MG  --  2.4 2.4  PHOS  --  3.7 3.9   GFR: Estimated Creatinine Clearance: 53.9 mL/min (A) (by C-G formula based on SCr of 1.43 mg/dL (H)). Recent Labs  Lab 08/15/24 1541 08/17/24 0300 08/18/24 0418  WBC 9.1 14.7* 13.7*    Liver Function Tests: Recent Labs  Lab 08/15/24 1541 08/16/24 0631 08/17/24 0300 08/18/24 0418  AST 15  --  12* 10*   ALT 10  --  12 11  ALKPHOS 65  --  48 43  BILITOT 0.5  --  0.7 0.7  PROT 7.2 6.4* 6.6 6.5  ALBUMIN  3.9  --  2.8* 2.8*   No results for input(s): LIPASE, AMYLASE in the last 168 hours. No results for input(s): AMMONIA in the last 168 hours.  ABG    Component Value Date/Time   HCO3 38.9 (H) 08/08/2022 1611   TCO2 30 12/01/2016 0924   O2SAT 84.4 08/08/2022 1611     Coagulation Profile: Recent Labs  Lab 08/18/24 0418  INR 1.0    Cardiac Enzymes: No results for input(s): CKTOTAL, CKMB, CKMBINDEX, TROPONINI in the last 168 hours.  HbA1C: Hgb A1c MFr Bld  Date/Time Value Ref Range Status  08/17/2024 03:00 AM 9.0 (H) 4.8 - 5.6 % Final    Comment:    (NOTE) Diagnosis of Diabetes The following HbA1c ranges recommended by the American Diabetes Association (ADA) may be used as an aid in the diagnosis of diabetes mellitus.  Hemoglobin             Suggested A1C NGSP%              Diagnosis  <5.7                   Non Diabetic  5.7-6.4                Pre-Diabetic  >6.4                   Diabetic  <7.0                   Glycemic control for                       adults with diabetes.    10/12/2022 10:14 AM 7.2 (H) 4.8 - 5.6 % Final    Comment:    (NOTE)         Prediabetes: 5.7 - 6.4         Diabetes: >6.4         Glycemic control for adults with diabetes: <7.0     CBG: Recent Labs  Lab 08/17/24 1553 08/17/24 2122 08/18/24 0623 08/18/24 1102 08/18/24 1558  GLUCAP 261* 313* 156* 254* 287*    Review of Systems:   As per HPI  Past Medical History:  He,  has a past medical history of Arthritis, Cancer (HCC), Chronic respiratory failure (HCC), Colon polyps, Cough, Diabetes mellitus without complication (HCC), Dyspnea, GERD (gastroesophageal reflux disease), H/O tooth extraction (week of 03/15/2015 ), History of kidney stones, Hypercholesteremia, Hypertension, Lumbar stenosis with neurogenic claudication, Pneumonia, Pre-diabetes, Renal disorder, and  Sleep apnea.   Surgical History:   Past Surgical History:  Procedure Laterality Date   APPENDECTOMY     BACK SURGERY     BIOPSY  12/03/2022  Procedure: BIOPSY;  Surgeon: Eartha Angelia Sieving, MD;  [facility];  Service: Gastroenterology;;   CHOLECYSTECTOMY     COLONOSCOPY  02/04/2012   Procedure: COLONOSCOPY;  Surgeon: Oneil DELENA Budge, MD;  [facility];  Service: Gastroenterology;  Laterality: N/A;   COLONOSCOPY N/A 09/22/2018   Procedure: COLONOSCOPY;  Surgeon: Budge Oneil, MD;  [facility];  Service: Gastroenterology;  Laterality: N/A;   COLONOSCOPY W/ POLYPECTOMY     COLONOSCOPY WITH PROPOFOL  N/A 10/15/2022   Procedure: COLONOSCOPY WITH PROPOFOL ;  Surgeon: Cindie Carlin POUR, DO;  [facility];  Service: Endoscopy;  Laterality: N/A;   COLONOSCOPY WITH PROPOFOL  N/A 12/03/2022   Procedure: COLONOSCOPY WITH PROPOFOL ;  Surgeon: Eartha Angelia Sieving, MD;  [facility];  Service: Gastroenterology;  Laterality: N/A;  12:00pm, asa 3   ENTEROSCOPY N/A 12/03/2022   Procedure: ENTEROSCOPY;  Surgeon: Eartha Angelia Sieving, MD;  [facility];  Service: Gastroenterology;  Laterality: N/A;  push enteroscopy   ESOPHAGOGASTRODUODENOSCOPY (EGD) WITH PROPOFOL  N/A 10/15/2022   Procedure: ESOPHAGOGASTRODUODENOSCOPY (EGD) WITH PROPOFOL ;  Surgeon: Cindie Carlin POUR, DO;  [facility];  Service: Endoscopy;  Laterality: N/A;   GIVENS CAPSULE STUDY N/A 11/14/2022   Procedure: GIVENS CAPSULE STUDY;  Surgeon: Shaaron Lamar HERO, MD;  [facility];  Service: Endoscopy;  Laterality: N/A;   HEMOSTASIS CLIP PLACEMENT  12/03/2022   Procedure: HEMOSTASIS CLIP PLACEMENT;  Surgeon: Eartha Angelia Sieving, MD;  [facility];  Service: Gastroenterology;;   HERNIA REPAIR     Bilateral inguinal hernia repair X 4   HOT HEMOSTASIS  12/03/2022   Procedure: HOT HEMOSTASIS (ARGON PLASMA COAGULATION/BICAP);  Surgeon:  Eartha Angelia, Sieving, MD;  [facility];  Service: Gastroenterology;;   hydrocelectomy     left knee arthroscopy     LITHOTRIPSY     LYMPHADENECTOMY Bilateral 03/22/2015   Procedure: PELVIC LYMPHADENECTOMY;  Surgeon: Ricardo Likens, MD;  Location: [facility];  Service: Urology;  Laterality: Bilateral;   NM MYOVIEW  LTD  2011   POLYPECTOMY  09/22/2018   Procedure: POLYPECTOMY;  Surgeon: Budge Oneil, MD;  [facility];  Service: Gastroenterology;;   POLYPECTOMY  10/15/2022   Procedure: POLYPECTOMY;  Surgeon: Cindie Carlin POUR, DO;  [facility];  Service: Endoscopy;;   POLYPECTOMY  12/03/2022   Procedure: POLYPECTOMY;  Surgeon: Eartha Angelia Sieving, MD;  [facility];  Service: Gastroenterology;;   ROBOT ASSISTED LAPAROSCOPIC RADICAL PROSTATECTOMY N/A 03/22/2015   Procedure: ROBOTIC ASSISTED LAPAROSCOPIC RADICAL PROSTATECTOMY WITH INDOCYANINE GREEN DYE INJECTION;  Surgeon: Ricardo Likens, MD;  Location: [facility];  Service: Urology;  Laterality: N/A;   ROBOTIC ASSISTED LAPAROSCOPIC LYSIS OF ADHESION  03/22/2015   Procedure: ROBOTIC ASSISTED LAPAROSCOPIC LYSIS OF ADHESION;  Surgeon: Ricardo Likens, MD;  Location: [facility];  Service: Urology;;   SHOULDER SURGERY Right    SUBMUCOSAL LIFTING INJECTION  12/03/2022   Procedure: SUBMUCOSAL LIFTING INJECTION;  Surgeon: Eartha Angelia Sieving, MD;  [facility];  Service: Gastroenterology;;   VASECTOMY       Social History:   reports that he has quit smoking. His smoking use included cigarettes. He has a 55 pack-year smoking history. He has been exposed to tobacco smoke. He has never used smokeless tobacco. He reports that he does not currently use alcohol after a past usage of about 1.0 standard drink of alcohol per week. He reports that he does not use drugs.   Family History:  His family history includes Heart failure in his father; Stroke in his father. There is no history of Colon cancer.    Allergies Allergies  Allergen Reactions   Lidocaine  Hives   Benzocaine Other (See Comments)    Blisters    Other Other (See Comments)    Novocaine - blisters in mouth     Home Medications  Prior to Admission medications   Medication Sig Start Date End Date Taking? Authorizing Provider  ALPRAZolam (XANAX) 0.5 MG tablet Take 0.5 mg by mouth at bedtime as needed for anxiety or sleep. 06/17/24   [provider]  azithromycin  (ZITHROMAX ) 250 MG tablet Take 250 mg by mouth as directed. 08/12/24   [provider]  bisoprolol  (ZEBETA ) 10 MG tablet Take 10 mg by mouth daily.    [provider]  Dapagliflozin Propanediol (FARXIGA PO) Take by mouth.    [provider]  docusate sodium  (COLACE) 100 MG capsule Take 100 mg by mouth daily.    [provider]  famotidine  (PEPCID ) 20 MG tablet Take 20 mg by mouth daily. 10/01/23   [provider]  ferrous sulfate  325 (65 FE) MG tablet Take 1 tablet (325 mg total) by mouth 3 (three) times daily with meals. Patient taking differently: Take 325 mg by mouth daily with breakfast. 11/15/22 07/28/23  Willette Adriana LABOR, MD  furosemide  (LASIX ) 40 MG tablet Take 1 tablet (40 mg total) by mouth daily as needed (swelling or shortness of breath). 05/02/23   Mallipeddi, Vishnu P, MD  glimepiride  (AMARYL ) 1 MG tablet Take 1 mg by mouth 2 (two) times daily. 09/18/22   [provider]  HYDROcodone -acetaminophen  (NORCO) 10-325 MG tablet Take 1 tablet by mouth 4 (four) times daily as needed for moderate pain (pain score 4-6). Takes 1/2 tablet 10/25/22   [provider]  ondansetron  (ZOFRAN ) 4 MG tablet Take 4 mg by mouth every 8 (eight) hours as needed. 10/01/23   [provider]  OXYGEN  Inhale 3 L into the lungs at bedtime.    [provider]  pantoprazole  (PROTONIX ) 40 MG tablet Take 1 tablet (40 mg total) by mouth daily. 08/09/22   Ricky Fines, MD  rosuvastatin  (CRESTOR ) 40 MG  tablet TAKE ONE TABLET (40MG  TOTAL) BY MOUTH DAILY 02/09/24   Mallipeddi, Vishnu P, MD  traMADol (ULTRAM) 50 MG tablet Take 50 mg by mouth every 6 (six) hours as needed. 06/17/24   [provider]  valsartan  (DIOVAN ) 80 MG tablet TAKE ONE TABLET (80 MG TOTAL) BY MOUTH DAILY. 04/03/24   [name], MD  Vitamin D , Ergocalciferol , 50000 units CAPS Take 1 capsule by mouth once a week. 06/04/23   [provider]

## 2024-08-18 NOTE — Progress Notes (Addendum)
   08/18/24 1621  TOC Brief Assessment  Insurance and Status Reviewed  Patient has primary care physician Yes  Home environment has been reviewed home w/ spouse  Prior level of function: independent  Prior/Current Home Services No current home services  Social Drivers of Health Review SDOH reviewed no interventions necessary  Readmission risk has been reviewed Yes  Transition of care needs no transition of care needs at this time    Pt has baseline home 02- 4L Inpatient Care Management (ICM) will continue to monitor patient advancement through interdisciplinary progression rounds. If new patient transition needs arise, please place a ICM (CM/CSW) consult.

## 2024-08-18 NOTE — Progress Notes (Signed)
 Mobility Specialist Progress Note:    08/18/24 1050  Mobility  Activity Dangled on edge of bed  Level of Assistance Moderate assist, patient does 50-74%  Assistive Device None  Activity Response Tolerated well  Mobility Referral Yes  Mobility visit 1 Mobility  Mobility Specialist Start Time (ACUTE ONLY) 1050  Mobility Specialist Stop Time (ACUTE ONLY) 1059  Mobility Specialist Time Calculation (min) (ACUTE ONLY) 9 min   Pt received supine in bed. Agreeable to dangle EOB. Required ModA to sit up. Tolerated well, more productive coughing. BP 127/55. SpO2 91% on 4L. RN notified. Left with all needs met, call bell in reach.   Kaylem Gidney Mobility Specialist Please contact via Special Educational Needs Teacher or  Rehab office at [phone]

## 2024-08-18 NOTE — Progress Notes (Signed)
 Upon patient assessment this AM, patient chest tube to suction, placed to water  seal as per current order on 08/16/24. Bed side RN updated. Danyel Tobey Jessup RN

## 2024-08-18 NOTE — Progress Notes (Signed)
 [facility]   ROUNDING   NOTE Mark Cannon FMW:[id number]  DOB: 08/14/1951  DOA: 08/15/2024  PCP: Leonce Lucie PARAS, PA-C  08/18/2024,10:56 AM  LOS: 3 days    Code Status: Full code     from: Home   73 year old white male quit smoking 2 years ago Chronic blood loss anemia with inpatient workup on admission 1/31 11/15/18/2024 colonoscopy showed 8 polyps EGD normal CKD 3 Prostate cancer status post prostatectomy 03/22/2015 Dr. Christopher Chronic respiratory failure on 3 to 4 L of oxygen -untreated sleep apnea Retinal branch occlusion on 2023 admission supposed to be on aspirin  Previous L4-L5 decompression Plif [facility] Dr. Alix 2019  Chronology  11/2 came to emergency room at [facility] with shortness of breath productive cough over 1 week and had completed azithromycin  as an outpatient-CO2 34 creatinine 1.4 RVP testing negative CXR small right apical pneumothorax new from prior right lower lobe opacity and effusion-CT chest right sided hydropneumothorax with dependent consolidation-Rx ceftriaxone  doxycycline transferred to [name] for CCM to see--chest tube inserted 11/4 chest tube changed to waterseal   Assessment  & Plan :    Hydropneumothorax-exudative by lights criteria  Pulmonary cultures no growth to date, cytology pending Still putting out about 400 cc so needs to keep the chest tube in place--- chest tube became dislodged 11/5 but no pneumothorax and pulmonary aware and they are managing the tube Needs repeat chest CT once tube is out in about 2 to 3 weeks to ensure no lung cancer is present given heavy smoking history Chronic respiratory failure at baseline on 2 to 4 L Probable pneumonia COPD-mild acute exacerbation on admission Continue azithromycin  ceftriaxone  since 11/4 Continue prednisone  40 for 1 more dose but as no wheeze would discontinue Continue Tussionex 5 mL every 12 as needed, Mucinex 1 tab twice daily, deep DuoNeb 3 mL every 4 as needed DM 2-at home on Farxiga, Amaryl  1 CBGs are  ranging 150-254-continue Lantus 10, 3 times daily insulin  sliding scale alone and expect sugars to come down AKI superimposed on CKD 3 AA At home is on as needed Lasix  40 as needed Farxiga Continue valsartan  lower dose/changed to AVaPro  75 Previous prostate cancer with resection in 2016 Outpatient PSA monitoring as per his urologist Cirrhosis Outpatient workup Previous GI bleed See above-continue Protonix  40 daily Pepcid  20 daily HTN Continue bisoprolol  10 daily, Crestor  40 daily  Data Reviewed today:  Sodium 140 potassium 4.3 BUN/creatinine 27/1.4 Magnesium  2.4 bilirubin 0.7 AST/ALT 10/11 WBC 13.7 hemoglobin 13.9 platelet 221  DVT prophylaxis: Lovenox   Status is: Inpatient Remains inpatient appropriate because:   Requires further management of chest tube    Dispo/Global plan: Hopeful for discharge home   Time 45   Subjective:   Called by nursing about one of the tubes leading to his pigtail coming dislodged--he has not really been out of bed much He is coughing a little bit He does not feel any chest pain   Objective + exam Vitals:   08/17/24 2343 08/18/24 0406 08/18/24 0421 08/18/24 0750  BP: (!) 128/57 (!) 127/49  118/62  Pulse: (!) 58 65  63  Resp: 16 16  19   Temp: 98.2 F (36.8 C) 97.8 F (36.6 C)  97.9 F (36.6 C)  TempSrc: Oral Oral  Oral  SpO2: 99% 100%  99%  Weight:   106.3 kg   Height:       Filed Weights   08/16/24 0150 08/17/24 0329 08/18/24 0421  Weight: 106.6 kg 107.8 kg 106.3 kg  Examination: Awake coherent no distress pleasant CTAB no added sound no rales no rhonchi decreased air entry right side cannot appreciate egophony S1-S2 no murmur ROM intact Trace edema Abdomen soft no rebound no guarding Neuro intact     Scheduled Meds:  azithromycin   250 mg Oral Daily   bisoprolol   10 mg Oral Daily   calcium  carbonate  1 tablet Oral Q breakfast   dextromethorphan-guaiFENesin  1 tablet Oral BID   enoxaparin  (LOVENOX ) injection  40  mg Subcutaneous Q24H   famotidine   20 mg Oral Daily   insulin  aspart  0-5 Units Subcutaneous QHS   insulin  aspart  0-9 Units Subcutaneous TID WC   insulin  glargine-yfgn  10 Units Subcutaneous QHS   irbesartan   75 mg Oral Daily   pantoprazole   40 mg Oral Daily   rosuvastatin   40 mg Oral Daily   Continuous Infusions: acetaminophen , chlorpheniramine-HYDROcodone , ipratropium-albuterol , oxyCODONE  **OR** oxyCODONE   Jai-Gurmukh Revella Shelton, MD  [facility]

## 2024-08-19 ENCOUNTER — Inpatient Hospital Stay (HOSPITAL_COMMUNITY)

## 2024-08-19 DIAGNOSIS — Z4682 Encounter for fitting and adjustment of non-vascular catheter: Secondary | ICD-10-CM | POA: Diagnosis not present

## 2024-08-19 DIAGNOSIS — J939 Pneumothorax, unspecified: Secondary | ICD-10-CM | POA: Diagnosis not present

## 2024-08-19 DIAGNOSIS — J948 Other specified pleural conditions: Secondary | ICD-10-CM | POA: Diagnosis not present

## 2024-08-19 DIAGNOSIS — Z981 Arthrodesis status: Secondary | ICD-10-CM | POA: Diagnosis not present

## 2024-08-19 DIAGNOSIS — N179 Acute kidney failure, unspecified: Secondary | ICD-10-CM | POA: Diagnosis not present

## 2024-08-19 DIAGNOSIS — J9601 Acute respiratory failure with hypoxia: Secondary | ICD-10-CM | POA: Diagnosis not present

## 2024-08-19 DIAGNOSIS — J90 Pleural effusion, not elsewhere classified: Secondary | ICD-10-CM | POA: Diagnosis not present

## 2024-08-19 DIAGNOSIS — J441 Chronic obstructive pulmonary disease with (acute) exacerbation: Secondary | ICD-10-CM | POA: Diagnosis not present

## 2024-08-19 LAB — BASIC METABOLIC PANEL WITH GFR
Anion gap: 11 (ref 5–15)
BUN: 31 mg/dL — ABNORMAL HIGH (ref 8–23)
CO2: 28 mmol/L (ref 22–32)
Calcium: 8.7 mg/dL — ABNORMAL LOW (ref 8.9–10.3)
Chloride: 99 mmol/L (ref 98–111)
Creatinine, Ser: 1.36 mg/dL — ABNORMAL HIGH (ref 0.61–1.24)
GFR, Estimated: 55 mL/min — ABNORMAL LOW (ref >60)
Glucose, Bld: 260 mg/dL — ABNORMAL HIGH (ref 70–99)
Potassium: 4.4 mmol/L (ref 3.5–5.1)
Sodium: 138 mmol/L (ref 135–145)

## 2024-08-19 LAB — STREP PNEUMONIAE URINARY ANTIGEN: Strep Pneumo Urinary Antigen: NEGATIVE

## 2024-08-19 LAB — CBC WITH DIFFERENTIAL/PLATELET
Abs Immature Granulocytes: 0.05 K/uL (ref 0.00–0.07)
Basophils Absolute: 0 K/uL (ref 0.0–0.1)
Basophils Relative: 0 %
Eosinophils Absolute: 0 K/uL (ref 0.0–0.5)
Eosinophils Relative: 0 %
HCT: 43.9 % (ref 39.0–52.0)
Hemoglobin: 13.6 g/dL (ref 13.0–17.0)
Immature Granulocytes: 1 %
Lymphocytes Relative: 9 %
Lymphs Abs: 0.9 K/uL (ref 0.7–4.0)
MCH: 28.7 pg (ref 26.0–34.0)
MCHC: 31 g/dL (ref 30.0–36.0)
MCV: 92.6 fL (ref 80.0–100.0)
Monocytes Absolute: 0.5 K/uL (ref 0.1–1.0)
Monocytes Relative: 5 %
Neutro Abs: 8.7 K/uL — ABNORMAL HIGH (ref 1.7–7.7)
Neutrophils Relative %: 85 %
Platelets: 192 K/uL (ref 150–400)
RBC: 4.74 MIL/uL (ref 4.22–5.81)
RDW: 16 % — ABNORMAL HIGH (ref 11.5–15.5)
WBC: 10.2 K/uL (ref 4.0–10.5)
nRBC: 0 % (ref 0.0–0.2)

## 2024-08-19 LAB — GLUCOSE, CAPILLARY
Glucose-Capillary: 208 mg/dL — ABNORMAL HIGH (ref 70–99)
Glucose-Capillary: 230 mg/dL — ABNORMAL HIGH (ref 70–99)
Glucose-Capillary: 251 mg/dL — ABNORMAL HIGH (ref 70–99)
Glucose-Capillary: 273 mg/dL — ABNORMAL HIGH (ref 70–99)
Glucose-Capillary: 351 mg/dL — ABNORMAL HIGH (ref 70–99)

## 2024-08-19 LAB — CYTOLOGY - NON PAP

## 2024-08-19 LAB — BRAIN NATRIURETIC PEPTIDE: B Natriuretic Peptide: 133.4 pg/mL — ABNORMAL HIGH (ref 0.0–100.0)

## 2024-08-19 MED ORDER — INSULIN ASPART 100 UNIT/ML IJ SOLN
4.0000 [IU] | Freq: Three times a day (TID) | INTRAMUSCULAR | Status: DC
  Administered 2024-08-19 – 2024-08-22 (×7): 4 [IU] via SUBCUTANEOUS
  Filled 2024-08-19 (×7): qty 4

## 2024-08-19 MED ORDER — INSULIN GLARGINE-YFGN 100 UNIT/ML ~~LOC~~ SOLN
15.0000 [IU] | Freq: Every day | SUBCUTANEOUS | Status: DC
  Administered 2024-08-19 – 2024-08-21 (×3): 15 [IU] via SUBCUTANEOUS
  Filled 2024-08-19 (×4): qty 0.15

## 2024-08-19 MED ORDER — INSULIN ASPART 100 UNIT/ML IJ SOLN
0.0000 [IU] | Freq: Three times a day (TID) | INTRAMUSCULAR | Status: DC
  Administered 2024-08-19: 8 [IU] via SUBCUTANEOUS
  Administered 2024-08-20 (×3): 3 [IU] via SUBCUTANEOUS
  Administered 2024-08-21: 5 [IU] via SUBCUTANEOUS
  Administered 2024-08-21 – 2024-08-22 (×4): 3 [IU] via SUBCUTANEOUS
  Filled 2024-08-19: qty 8
  Filled 2024-08-19: qty 5
  Filled 2024-08-19 (×7): qty 3

## 2024-08-19 NOTE — Plan of Care (Signed)

## 2024-08-19 NOTE — Progress Notes (Signed)
 Mobility Specialist Progress Note:    08/19/24 0915  Mobility  Activity Ambulated with assistance  Level of Assistance Contact guard assist, steadying assist  Assistive Device Front wheel walker  Distance Ambulated (ft) 15 ft  Activity Response Tolerated well  Mobility Referral Yes  Mobility visit 1 Mobility  Mobility Specialist Start Time (ACUTE ONLY) 0915  Mobility Specialist Stop Time (ACUTE ONLY) [id number]  Mobility Specialist Time Calculation (min) (ACUTE ONLY) 8 min   Pt received in bed, requesting to sit up at sink. CGA with RW for safety. Tolerated well, no coughing. Left at sink with nsg staff. All needs met.   Shawndrea Rutkowski Mobility Specialist Please contact via Special Educational Needs Teacher or  Rehab office at [phone]

## 2024-08-19 NOTE — Progress Notes (Signed)
 [facility]   ROUNDING   NOTE Mark Cannon FMW:[id number]  DOB: November 25, 1950  DOA: 08/15/2024  PCP: Mark Lucie PARAS, PA-C  08/19/2024,3:17 PM  LOS: 4 days    Code Status: Full code     from: Home   73 year old white male quit smoking 2 years ago Chronic blood loss anemia with inpatient workup on admission 1/31 11/15/18/2024 colonoscopy showed 8 polyps EGD normal CKD 3 Prostate cancer status post prostatectomy 03/22/2015 Dr. Christopher Chronic respiratory failure on 3 to 4 L of oxygen -untreated sleep apnea Retinal branch occlusion on 2023 admission supposed to be on aspirin  Previous L4-L5 decompression Plif [facility] Dr. Alix 2019  Chronology  11/2 came to emergency room at [facility] with shortness of breath productive cough over 1 week and had completed azithromycin  as an outpatient-CO2 34 creatinine 1.4 RVP testing negative CXR small right apical pneumothorax new from prior right lower lobe opacity and effusion-CT chest right sided hydropneumothorax with dependent consolidation-Rx ceftriaxone  doxycycline transferred to [name] for [facility]CM to see--chest tube inserted 11/4 chest tube changed to waterseal   Assessment  & Plan :    Hydropneumothorax-right lower lobe opacity exudative by lights criteria  Chest tube culture NGTD 11/4, Gram stain no organisms only WBCs present-strep pneumo negative Legionella pending Patient was on azithromycin  since 11/4-pulmonary added ceftriaxone  11/5 as cannot rule out parapneumonic effusion Requires potentially several more days chest drainage and a lot less drainage to be able to remove chest tube which is now currently at waterseal CT chest ordered per pulmonary-follow Chronic respiratory failure at baseline on 2 to 4 L Probable pneumonia COPD-mild acute exacerbation on admission Continue azithromycin  ceftriaxone  since 11/4-treat for 7 to 10 days or as per pulmonary Steroids discontinued 11/5 Continuous Tussionex 5 every 12 Mucinex 1 tab twice daily, DuoNeb every 4 as  needed CBGs are ranging 150-254-continue Lantus 10, 3 times daily insulin  sliding scale alone and expect sugars to come down Diabetes mellitus with hyperglycemia Amaryl  from home was held and we can probably resume in a.m.-currently on sliding scale insulin   Sugars up to 400 overnight 11/5 currently in the 250-210 range-increase long-acting to 15 units Add moderate sliding scale with 4 units 3 times daily meal coverage no at bedtime needed-watch sugars as did receive some steroids up until 11/5 AKI superimposed on CKD 3 AA At home is on as needed Lasix  40 as needed Farxiga-these have been held in setting of probable pneumonia Continue valsartan  lower dose/changed to AVaPro  75 Previous prostate cancer with resection in 2016 Outpatient PSA monitoring as per his urologist--- nuclear body scan 03/02/2024 was negative Cirrhosis Outpatient workup per GI Previous GI bleed See above-continue Protonix  40 daily Pepcid  20 daily HTN Moderately controlled Continue bisoprolol  10 daily, Crestor  40 daily  Data Reviewed today:   Sodium 138 potassium 4.4 BUN/creatinine 31/1.3 calcium  8.7 BNP 133 WBC 10.2 hemoglobin 13.6 platelet 192  DVT prophylaxis: Lovenox   Status is: Inpatient Remains inpatient appropriate because:   Requires further management of chest tube    Dispo/Global plan: Hopeful for discharge home eventually but making poor effort with movement  Time 45   Subjective:   Awake coherent no distress Was hypoglycemic overnight meds adjusted some Looks fair less cough about to go for CT No fever   Objective + exam Vitals:   08/18/24 2304 08/19/24 0401 08/19/24 0751 08/19/24 1100  BP: (!) 147/66 (!) 141/78 138/61 127/70  Pulse: 73 65 (!) 59 67  Resp: 20 20 18 15   Temp: 98.3 F (36.8  C) 98.6 F (37 C) 97.9 F (36.6 C) 98.8 F (37.1 C)  TempSrc: Oral Oral Oral Oral  SpO2: 98% 95% 100% 99%  Weight:  106.3 kg    Height:       Filed Weights   08/17/24 0329 08/18/24 0421  08/19/24 0401  Weight: 107.8 kg 106.3 kg 106.3 kg     Examination:  EOMI NCAT no focal deficit no icterus no pallor no wheeze no rales although crackles posterolaterally S1-S2 no murmur On monitors seems to be sinus with PVCs   Scheduled Meds:  azithromycin   250 mg Oral Daily   bisoprolol   10 mg Oral Daily   calcium  carbonate  1 tablet Oral Q breakfast   dextromethorphan-guaiFENesin  1 tablet Oral BID   enoxaparin  (LOVENOX ) injection  40 mg Subcutaneous Q24H   famotidine   20 mg Oral Daily   insulin  aspart  0-5 Units Subcutaneous QHS   insulin  aspart  0-9 Units Subcutaneous TID WC   insulin  glargine-yfgn  10 Units Subcutaneous QHS   irbesartan   75 mg Oral Daily   pantoprazole   40 mg Oral Daily   rosuvastatin   40 mg Oral Daily   Continuous Infusions:  cefTRIAXone  (ROCEPHIN )  IV 2 g (08/18/24 1415)   acetaminophen , chlorpheniramine-HYDROcodone , ipratropium-albuterol , oxyCODONE  **OR** oxyCODONE   [name], MD  [facility]

## 2024-08-19 NOTE — Plan of Care (Signed)
 ?  Problem: Clinical Measurements: ?Goal: Will remain free from infection ?Outcome: Progressing ?  ?

## 2024-08-19 NOTE — Progress Notes (Signed)
 SATURATION QUALIFICATIONS: (This note is used to comply with regulatory documentation for home oxygen )  Patient Saturations on Room Air at Rest = 74%  Patient Saturations on Room Air while Ambulating = NT as desats on RA at rest  Patient Saturations on 6 Liters of oxygen  while Ambulating = 88%  Please briefly explain why patient needs home oxygen :Pt requires 3LO2 at rest and 6LO2 with activity to keep sats > 88%. Micayla Brathwaite M,PT Acute Rehab Services [phone]

## 2024-08-19 NOTE — Progress Notes (Addendum)
 NAME:  Mark Cannon, MRN:  [id number], DOB:  March 26, 1951, LOS: 4 ADMISSION DATE:  08/15/2024, CONSULTATION DATE:  08/16/24 REFERRING MD:  TRH, CHIEF COMPLAINT:  chest tube management   History of Present Illness:  73 yo male transferred from [facility] after 2 days of sob. Pt was see at urgent care 2 days ago and prescribed azithromycin . This did not provide any benefit per pt and today as his sob worsened he called EMS. Upon their arrival pt was noted to have oxygen  saturation of 66% on RA. There is no reports of fever/chills/recent sick contacts, no n/v/d. Viral panel negative. Chest imaging revealed R apical ptx with RLL opacity/effusion. CT chest with R hydropneumothorax.   Pt was subsequently transferred to [facility] and ccm was consulted for chest tube management.   Pertinent  Medical History  HTN Hyperlipidemia Obesity Osa (not on cpap) Prostate cancer (s/p surgery) Tobacco abuse  Significant Hospital Events: Including procedures, antibiotic start and stop dates in addition to other pertinent events   Admitted to hospitalist service after chest tube placement 11/2 Transferred to [facility] 11/3  Interim History / Subjective:  No new complains 130 output in chest tube in 24 hrs recorded on chart, however on bedside eveluation he has drained atleast another 400cc    Objective    Blood pressure 127/70, pulse 67, temperature 98.8 F (37.1 C), temperature source Oral, resp. rate 15, height 5' 7.5 (1.715 m), weight 106.3 kg, SpO2 99%.        Intake/Output Summary (Last 24 hours) at 08/19/2024 1445 Last data filed at 08/19/2024 9378 Gross per 24 hour  Intake --  Output 2480 ml  Net -2480 ml   Filed Weights   08/17/24 0329 08/18/24 0421 08/19/24 0401  Weight: 107.8 kg 106.3 kg 106.3 kg    Examination: General: nad, resting comfortably in bed HENT: ncat, perrla, eomi, mmmp Lungs: diminished R base, cta otherwise Cardiovascular: rrr Abdomen: nt, mildly distended bs + Extremities: no  c/c/e Neuro: no focal deficits GU: deferred  Resolved problem list   Assessment and Plan   Acute hypoxic resp failure Acute exacerbation COPD Hydropneumothorax CAP Cirrhosis on imaging  -titrate supplemental oxygen  as able for sat >88% -steroids, nebs per primary -family reported prior hx of effusion. Upon review of chart he had small left and moderate right effusion in 10/2022 and fluid was transudative then. based on hx and elevated BNP looks like pt has underlying HFpEF. Wife reports intermittent leg swelling. -  Pt does reports cough and sputum production prior to this admission. Agree with azithromycin  and steroids for now. LDH elevated on recent pleural fluid. Cultures pending. Can't rule our parapneumonic effusion. Added ceftriaxone . Sputum culture pending. Urine legionella and urine strep ag - continue suction on chest tube. will repeat CT chest today to r/o pneumonia, malignancy, cirrhosis - consider diuresis if tolerated -chest xray in am - recommend GI evaluation for cirrhosis, likely outpatient -all other issues per primary  Updated wife over the phone   Labs   CBC: Recent Labs  Lab 08/15/24 1541 08/17/24 0300 08/18/24 0418 08/19/24 0325  WBC 9.1 14.7* 13.7* 10.2  NEUTROABS 6.4 12.7* 11.8* 8.7*  HGB 14.4 13.9 13.9 13.6  HCT 46.7 44.5 45.1 43.9  MCV 95.1 94.1 93.2 92.6  PLT 239 245 221 192    Basic Metabolic Panel: Recent Labs  Lab 08/15/24 1541 08/17/24 0300 08/18/24 0418 08/18/24 2240 08/19/24 0325  NA 142 137 140 136 138  K 4.2 4.9 4.3 4.7 4.4  CL 101 99 98 96* 99  CO2 34* 27 32 32 28  GLUCOSE 254* 350* 160* 410* 260*  BUN 10 26* 27* 32* 31*  CREATININE 1.45* 1.63* 1.43* 1.50* 1.36*  CALCIUM  8.8* 8.7* 8.8* 8.6* 8.7*  MG  --  2.4 2.4  --   --   PHOS  --  3.7 3.9  --   --    GFR: Estimated Creatinine Clearance: 56.7 mL/min (A) (by C-G formula based on SCr of 1.36 mg/dL (H)). Recent Labs  Lab 08/15/24 1541 08/17/24 0300 08/18/24 0418  08/18/24 1447 08/19/24 0325  PROCALCITON  --   --   --  <0.10  --   WBC 9.1 14.7* 13.7*  --  10.2    Liver Function Tests: Recent Labs  Lab 08/15/24 1541 08/16/24 0631 08/17/24 0300 08/18/24 0418  AST 15  --  12* 10*  ALT 10  --  12 11  ALKPHOS 65  --  48 43  BILITOT 0.5  --  0.7 0.7  PROT 7.2 6.4* 6.6 6.5  ALBUMIN  3.9  --  2.8* 2.8*   No results for input(s): LIPASE, AMYLASE in the last 168 hours. No results for input(s): AMMONIA in the last 168 hours.  ABG    Component Value Date/Time   HCO3 38.9 (H) 08/08/2022 1611   TCO2 30 12/01/2016 0924   O2SAT 84.4 08/08/2022 1611     Coagulation Profile: Recent Labs  Lab 08/18/24 0418  INR 1.0    Cardiac Enzymes: No results for input(s): CKTOTAL, CKMB, CKMBINDEX, TROPONINI in the last 168 hours.  HbA1C: Hgb A1c MFr Bld  Date/Time Value Ref Range Status  08/17/2024 03:00 AM 9.0 (H) 4.8 - 5.6 % Final    Comment:    (NOTE) Diagnosis of Diabetes The following HbA1c ranges recommended by the American Diabetes Association (ADA) may be used as an aid in the diagnosis of diabetes mellitus.  Hemoglobin             Suggested A1C NGSP%              Diagnosis  <5.7                   Non Diabetic  5.7-6.4                Pre-Diabetic  >6.4                   Diabetic  <7.0                   Glycemic control for                       adults with diabetes.    10/12/2022 10:14 AM 7.2 (H) 4.8 - 5.6 % Final    Comment:    (NOTE)         Prediabetes: 5.7 - 6.4         Diabetes: >6.4         Glycemic control for adults with diabetes: <7.0     CBG: Recent Labs  Lab 08/18/24 1558 08/18/24 2108 08/19/24 0037 08/19/24 0614 08/19/24 1102  GLUCAP 287* 440* 351* 230* 251*    Review of Systems:   As per HPI  Past Medical History:  He,  has a past medical history of Arthritis, Cancer (HCC), Chronic respiratory failure (HCC), Colon polyps, Cough, Diabetes mellitus without complication (HCC), Dyspnea,  GERD (gastroesophageal reflux disease), H/O tooth  extraction (week of 03/15/2015 ), History of kidney stones, Hypercholesteremia, Hypertension, Lumbar stenosis with neurogenic claudication, Pneumonia, Pre-diabetes, Renal disorder, and Sleep apnea.   Surgical History:   Past Surgical History:  Procedure Laterality Date   APPENDECTOMY     BACK SURGERY     BIOPSY  12/03/2022   Procedure: BIOPSY;  Surgeon: Eartha Angelia Sieving, MD;  [facility];  Service: Gastroenterology;;   CHOLECYSTECTOMY     COLONOSCOPY  02/04/2012   Procedure: COLONOSCOPY;  Surgeon: Oneil DELENA Budge, MD;  [facility];  Service: Gastroenterology;  Laterality: N/A;   COLONOSCOPY N/A 09/22/2018   Procedure: COLONOSCOPY;  Surgeon: Budge Oneil, MD;  [facility];  Service: Gastroenterology;  Laterality: N/A;   COLONOSCOPY W/ POLYPECTOMY     COLONOSCOPY WITH PROPOFOL  N/A 10/15/2022   Procedure: COLONOSCOPY WITH PROPOFOL ;  Surgeon: Cindie Carlin POUR, DO;  [facility];  Service: Endoscopy;  Laterality: N/A;   COLONOSCOPY WITH PROPOFOL  N/A 12/03/2022   Procedure: COLONOSCOPY WITH PROPOFOL ;  Surgeon: Eartha Angelia Sieving, MD;  [facility];  Service: Gastroenterology;  Laterality: N/A;  12:00pm, asa 3   ENTEROSCOPY N/A 12/03/2022   Procedure: ENTEROSCOPY;  Surgeon: Eartha Angelia Sieving, MD;  [facility];  Service: Gastroenterology;  Laterality: N/A;  push enteroscopy   ESOPHAGOGASTRODUODENOSCOPY (EGD) WITH PROPOFOL  N/A 10/15/2022   Procedure: ESOPHAGOGASTRODUODENOSCOPY (EGD) WITH PROPOFOL ;  Surgeon: Cindie Carlin POUR, DO;  [facility];  Service: Endoscopy;  Laterality: N/A;   GIVENS CAPSULE STUDY N/A 11/14/2022   Procedure: GIVENS CAPSULE STUDY;  Surgeon: Shaaron Lamar HERO, MD;  [facility];  Service: Endoscopy;  Laterality: N/A;   HEMOSTASIS CLIP PLACEMENT  12/03/2022   Procedure: HEMOSTASIS CLIP PLACEMENT;  Surgeon: Eartha Angelia Sieving, MD;  [facility];  Service: Gastroenterology;;   HERNIA REPAIR     Bilateral inguinal hernia repair X 4   HOT HEMOSTASIS  12/03/2022   Procedure: HOT HEMOSTASIS (ARGON PLASMA COAGULATION/BICAP);  Surgeon: Eartha Angelia, Sieving, MD;  [facility];  Service: Gastroenterology;;   hydrocelectomy     left knee arthroscopy     LITHOTRIPSY     LYMPHADENECTOMY Bilateral 03/22/2015   Procedure: PELVIC LYMPHADENECTOMY;  Surgeon: Ricardo Likens, MD;  Location: [facility];  Service: Urology;  Laterality: Bilateral;   NM MYOVIEW  LTD  2011   POLYPECTOMY  09/22/2018   Procedure: POLYPECTOMY;  Surgeon: Budge Oneil, MD;  [facility];  Service: Gastroenterology;;   POLYPECTOMY  10/15/2022   Procedure: POLYPECTOMY;  Surgeon: Cindie Carlin POUR, DO;  [facility];  Service: Endoscopy;;   POLYPECTOMY  12/03/2022   Procedure: POLYPECTOMY;  Surgeon: Eartha Angelia Sieving, MD;  [facility];  Service: Gastroenterology;;   ROBOT ASSISTED LAPAROSCOPIC RADICAL PROSTATECTOMY N/A 03/22/2015   Procedure: ROBOTIC ASSISTED LAPAROSCOPIC RADICAL PROSTATECTOMY WITH INDOCYANINE GREEN DYE INJECTION;  Surgeon: Ricardo Likens, MD;  Location: [facility];  Service: Urology;  Laterality: N/A;   ROBOTIC ASSISTED LAPAROSCOPIC LYSIS OF ADHESION  03/22/2015   Procedure: ROBOTIC ASSISTED LAPAROSCOPIC LYSIS OF ADHESION;  Surgeon: Ricardo Likens, MD;  Location: [facility];  Service: Urology;;   SHOULDER SURGERY Right    SUBMUCOSAL LIFTING INJECTION  12/03/2022   Procedure: SUBMUCOSAL LIFTING INJECTION;  Surgeon: Eartha Angelia Sieving, MD;  [facility];  Service: Gastroenterology;;   VASECTOMY       Social History:   reports that he has quit smoking. His smoking use included cigarettes.  He has a 55 pack-year smoking history. He has been exposed to tobacco smoke. He has never used smokeless tobacco. He reports that he does not currently use alcohol after a past usage  of about 1.0 standard drink of alcohol per week. He reports that he does not use drugs.   Family History:  His family history includes Heart failure in his father; Stroke in his father. There is no history of Colon cancer.   Allergies Allergies  Allergen Reactions   Lidocaine  Hives   Benzocaine Other (See Comments)    Blisters    Other Other (See Comments)    Novocaine - blisters in mouth     Home Medications  Prior to Admission medications   Medication Sig Start Date End Date Taking? Authorizing Provider  ALPRAZolam (XANAX) 0.5 MG tablet Take 0.5 mg by mouth at bedtime as needed for anxiety or sleep. 06/17/24   [provider]  azithromycin  (ZITHROMAX ) 250 MG tablet Take 250 mg by mouth as directed. 08/12/24   [provider]  bisoprolol  (ZEBETA ) 10 MG tablet Take 10 mg by mouth daily.    [provider]  Dapagliflozin Propanediol (FARXIGA PO) Take by mouth.    [provider]  docusate sodium  (COLACE) 100 MG capsule Take 100 mg by mouth daily.    [provider]  famotidine  (PEPCID ) 20 MG tablet Take 20 mg by mouth daily. 10/01/23   [provider]  ferrous sulfate  325 (65 FE) MG tablet Take 1 tablet (325 mg total) by mouth 3 (three) times daily with meals. Patient taking differently: Take 325 mg by mouth daily with breakfast. 11/15/22 07/28/23  Willette Adriana LABOR, MD  furosemide  (LASIX ) 40 MG tablet Take 1 tablet (40 mg total) by mouth daily as needed (swelling or shortness of breath). 05/02/23   Mallipeddi, Vishnu P, MD  glimepiride  (AMARYL ) 1 MG tablet Take 1 mg by mouth 2 (two) times daily. 09/18/22   [provider]  HYDROcodone -acetaminophen  (NORCO) 10-325 MG tablet Take 1 tablet by mouth 4 (four) times daily as needed for moderate pain (pain score 4-6). Takes 1/2 tablet 10/25/22   [provider]  ondansetron  (ZOFRAN ) 4 MG tablet Take 4 mg by mouth every 8 (eight) hours as needed. 10/01/23   [provider]  OXYGEN  Inhale 3 L into the lungs at bedtime.    [provider]  pantoprazole  (PROTONIX ) 40 MG tablet Take 1 tablet (40 mg total) by mouth daily. 08/09/22   Ricky Fines, MD  rosuvastatin  (CRESTOR ) 40 MG tablet TAKE ONE TABLET (40MG  TOTAL) BY MOUTH DAILY 02/09/24   Mallipeddi, Vishnu P, MD  traMADol (ULTRAM) 50 MG tablet Take 50 mg by mouth every 6 (six) hours as needed. 06/17/24   [provider]  valsartan  (DIOVAN ) 80 MG tablet TAKE ONE TABLET (80 MG TOTAL) BY MOUTH DAILY. 04/03/24   [name], MD  Vitamin D , Ergocalciferol , 50000 units CAPS Take 1 capsule by mouth once a week. 06/04/23   [provider]

## 2024-08-19 NOTE — Evaluation (Signed)
 Physical Therapy Evaluation Patient Details Name: Mark Cannon MRN: [id number] DOB: [birth year] Today's Date: 08/19/2024  History of Present Illness  73 yo male transferred from [facility] to [facility] on 11/2 after 2 days of sob. Upon arrival pt was noted to have oxygen  saturation of 66% on RA. Pt admitted with right PTX requiring chest tube.  COPD excerbation as well. PMH:  HTN  Hyperlipidemia  Obesity  Osa (not on cpap)  Prostate cancer (s/p surgery)  Tobacco abuse  Clinical Impression  Pt admitted with above diagnosis. Pt was able to ambulate with RW with good safety overall. Should progerss well and be able to go home with HHPT. Wil monitor O2 as pt definitely needed up to 6LO2 today with ambulation.   Pt currently with functional limitations due to the deficits listed below (see PT Problem List). Pt will benefit from acute skilled PT to increase their independence and safety with mobility to allow discharge.           If plan is discharge home, recommend the following: A little help with bathing/dressing/bathroom;Assistance with cooking/housework;Assist for transportation;Help with stairs or ramp for entrance   Can travel by private vehicle        Equipment Recommendations None recommended by PT  Recommendations for Other Services       Functional Status Assessment Patient has had a recent decline in their functional status and demonstrates the ability to make significant improvements in function in a reasonable and predictable amount of time.     Precautions / Restrictions Precautions Precautions: Fall Precaution/Restrictions Comments: chest tube Restrictions Weight Bearing Restrictions Per Provider Order: No      Mobility  Bed Mobility               General bed mobility comments: Pt sitting in chair at sink on arrival.  Brushing his teeth.    Transfers Overall transfer level: Needs assistance Equipment used: Rolling walker (2 wheels) Transfers: Sit to/from Stand Sit to  Stand: Contact guard assist           General transfer comment: cues for hand placement however pt stood to RW with CGA.    Ambulation/Gait Ambulation/Gait assistance: Contact guard assist Gait Distance (Feet): 110 Feet Assistive device: Rolling walker (2 wheels) Gait Pattern/deviations: Step-through pattern, Decreased stride length   Gait velocity interpretation: <1.31 ft/sec, indicative of household ambulator   General Gait Details: Pt able to ambulate with overall good safety with RW. Pt without LOB. Pt wanted to be left at sink and was going to bathe -NT aware and going to help pt.  Stairs            Wheelchair Mobility     Tilt Bed    Modified Rankin (Stroke Patients Only)       Balance                                             Pertinent Vitals/Pain Pain Assessment Pain Assessment: No/denies pain    Home Living Family/patient expects to be discharged to:: Private residence Living Arrangements: Spouse/significant other Available Help at Discharge: Family;Available 24 hours/day Type of Home: House Home Access: Level entry       Home Layout: One level Home Equipment: Agricultural Consultant (2 wheels);Shower seat;Wheelchair - manual;Cane - single point      Prior Function Prior Level of Function : Needs assist;Driving  Mobility Comments: pt independent without device ADLs Comments: pt I with B/D     Extremity/Trunk Assessment   Upper Extremity Assessment Upper Extremity Assessment: Defer to OT evaluation    Lower Extremity Assessment Lower Extremity Assessment: Overall WFL for tasks assessed    Cervical / Trunk Assessment Cervical / Trunk Assessment: Normal  Communication   Communication Communication: No apparent difficulties    Cognition Arousal: Alert Behavior During Therapy: WFL for tasks assessed/performed   PT - Cognitive impairments: Safety/Judgement, Problem solving                          Following commands: Intact       Cueing       General Comments General comments (skin integrity, edema, etc.): O2 on 3L 91% initiially.  With ambulation desat to 79% on 3L therefore incr to 6LO2. O2 end of rx 91% on 3LO2 at rest. Other VSS.    Exercises     Assessment/Plan    PT Assessment Patient needs continued PT services  PT Problem List Decreased activity tolerance;Decreased balance;Decreased mobility;Decreased coordination;Decreased knowledge of use of DME;Decreased safety awareness;Decreased knowledge of precautions;Cardiopulmonary status limiting activity       PT Treatment Interventions DME instruction;Gait training;Functional mobility training;Therapeutic activities;Therapeutic exercise;Balance training;Patient/family education    PT Goals (Current goals can be found in the Care Plan section)  Acute Rehab PT Goals Patient Stated Goal: to go home PT Goal Formulation: With patient Time For Goal Achievement: 09/02/24 Potential to Achieve Goals: Good    Frequency Min 2X/week     Co-evaluation               AM-PAC PT 6 Clicks Mobility  Outcome Measure Help needed turning from your back to your side while in a flat bed without using bedrails?: None Help needed moving from lying on your back to sitting on the side of a flat bed without using bedrails?: A Little Help needed moving to and from a bed to a chair (including a wheelchair)?: A Little Help needed standing up from a chair using your arms (e.g., wheelchair or bedside chair)?: A Little Help needed to walk in hospital room?: A Little Help needed climbing 3-5 steps with a railing? : A Lot 6 Click Score: 18    End of Session Equipment Utilized During Treatment: Gait belt;Oxygen  Activity Tolerance: Patient limited by fatigue Patient left: in chair;Other (comment) (at sink bathing with NT aware) Nurse Communication: Mobility status PT Visit Diagnosis: Muscle weakness (generalized) (M62.81)    Time:  8990-8965 PT Time Calculation (min) (ACUTE ONLY): 25 min   Charges:   PT Evaluation $PT Eval Moderate Complexity: 1 Mod PT Treatments $Gait Training: 8-22 mins PT General Charges $$ ACUTE PT VISIT: 1 Visit         Burnette Valenti M,PT Acute Rehab Services [phone]   Stephane JULIANNA Bevel 08/19/2024, 1:44 PM

## 2024-08-19 NOTE — Inpatient Diabetes Management (Signed)
 Inpatient Diabetes Program Recommendations  AACE/ADA: New Consensus Statement on Inpatient Glycemic Control (2015)  Target Ranges:  Prepandial:   less than 140 mg/dL      Peak postprandial:   less than 180 mg/dL (1-2 hours)      Critically ill patients:  140 - 180 mg/dL   Lab Results  Component Value Date   GLUCAP 251 (H) 08/19/2024   HGBA1C 9.0 (H) 08/17/2024    Review of Glycemic Control  Latest Reference Range & Units 08/18/24 21:08 08/19/24 00:37 08/19/24 06:14 08/19/24 11:02  Glucose-Capillary 70 - 99 mg/dL 559 (H) 648 (H) 769 (H) 251 (H)   Diabetes history: DM  Outpatient Diabetes medications:  Amaryl  1 mg bid Current orders for Inpatient glycemic control:  Novolog  0-9 units tid with meals and HS Semglee 10 units daily  Inpatient Diabetes Program Recommendations:    Steroids stopped.  CBG's should improve. If CBG's remain>180 mg/dL post-prandial, consider adding Novolog  2-3 units tid with meals (hold if patient eats less than 50% or NPO).   Thanks,  Randall Bullocks, RN, BC-ADM Inpatient Diabetes Coordinator Pager [phone]  (8a-5p)

## 2024-08-20 DIAGNOSIS — J948 Other specified pleural conditions: Secondary | ICD-10-CM | POA: Diagnosis not present

## 2024-08-20 DIAGNOSIS — N179 Acute kidney failure, unspecified: Secondary | ICD-10-CM | POA: Diagnosis not present

## 2024-08-20 DIAGNOSIS — J9601 Acute respiratory failure with hypoxia: Secondary | ICD-10-CM | POA: Diagnosis not present

## 2024-08-20 DIAGNOSIS — J441 Chronic obstructive pulmonary disease with (acute) exacerbation: Secondary | ICD-10-CM | POA: Diagnosis not present

## 2024-08-20 LAB — GLUCOSE, CAPILLARY
Glucose-Capillary: 160 mg/dL — ABNORMAL HIGH (ref 70–99)
Glucose-Capillary: 198 mg/dL — ABNORMAL HIGH (ref 70–99)
Glucose-Capillary: 188 mg/dL — ABNORMAL HIGH (ref 70–99)
Glucose-Capillary: 190 mg/dL — ABNORMAL HIGH (ref 70–99)

## 2024-08-20 LAB — CBC WITH DIFFERENTIAL/PLATELET
Abs Immature Granulocytes: 0.03 K/uL (ref 0.00–0.07)
Basophils Absolute: 0.1 K/uL (ref 0.0–0.1)
Basophils Relative: 1 %
Eosinophils Absolute: 0.5 K/uL (ref 0.0–0.5)
Eosinophils Relative: 5 %
HCT: 44 % (ref 39.0–52.0)
Hemoglobin: 13.9 g/dL (ref 13.0–17.0)
Immature Granulocytes: 0 %
Lymphocytes Relative: 15 %
Lymphs Abs: 1.3 K/uL (ref 0.7–4.0)
MCH: 28.8 pg (ref 26.0–34.0)
MCHC: 31.6 g/dL (ref 30.0–36.0)
MCV: 91.1 fL (ref 80.0–100.0)
Monocytes Absolute: 0.5 K/uL (ref 0.1–1.0)
Monocytes Relative: 6 %
Neutro Abs: 6.8 K/uL (ref 1.7–7.7)
Neutrophils Relative %: 73 %
Platelets: 192 K/uL (ref 150–400)
RBC: 4.83 MIL/uL (ref 4.22–5.81)
RDW: 15.9 % — ABNORMAL HIGH (ref 11.5–15.5)
WBC: 9.2 K/uL (ref 4.0–10.5)
nRBC: 0 % (ref 0.0–0.2)

## 2024-08-20 LAB — LEGIONELLA PNEUMOPHILA SEROGP 1 UR AG: L. pneumophila Serogp 1 Ur Ag: NEGATIVE

## 2024-08-20 LAB — BASIC METABOLIC PANEL WITH GFR
Anion gap: 12 (ref 5–15)
BUN: 25 mg/dL — ABNORMAL HIGH (ref 8–23)
CO2: 29 mmol/L (ref 22–32)
Calcium: 8.4 mg/dL — ABNORMAL LOW (ref 8.9–10.3)
Chloride: 96 mmol/L — ABNORMAL LOW (ref 98–111)
Creatinine, Ser: 1.2 mg/dL (ref 0.61–1.24)
GFR, Estimated: >60 mL/min (ref >60)
Glucose, Bld: 176 mg/dL — ABNORMAL HIGH (ref 70–99)
Potassium: 3.6 mmol/L (ref 3.5–5.1)
Sodium: 137 mmol/L (ref 135–145)

## 2024-08-20 MED ORDER — FUROSEMIDE 40 MG PO TABS: 40.0000 mg | ORAL_TABLET | Freq: Every day | ORAL | Status: DC | PRN

## 2024-08-20 MED ORDER — FUROSEMIDE 40 MG PO TABS
40.0000 mg | ORAL_TABLET | Freq: Every day | ORAL | Status: DC
  Administered 2024-08-20: 40 mg via ORAL
  Filled 2024-08-20: qty 1

## 2024-08-20 MED ORDER — FUROSEMIDE 10 MG/ML IJ SOLN
40.0000 mg | Freq: Once | INTRAMUSCULAR | Status: AC
  Administered 2024-08-20: 40 mg via INTRAVENOUS
  Filled 2024-08-20: qty 4

## 2024-08-20 MED ORDER — GLIMEPIRIDE 1 MG PO TABS
1.0000 mg | ORAL_TABLET | Freq: Two times a day (BID) | ORAL | Status: DC
  Administered 2024-08-20 – 2024-08-22 (×4): 1 mg via ORAL
  Filled 2024-08-20 (×5): qty 1

## 2024-08-20 NOTE — Progress Notes (Signed)
 Chest tube placed to water  seal per MD, output 12 cc. Pt breathing even and unlabored in 2l o2 via [place]. Plan of care continues.

## 2024-08-20 NOTE — Progress Notes (Signed)
 NAME:  Mark Cannon, MRN:  [id number], DOB:  04/01/1951, LOS: 5 ADMISSION DATE:  08/15/2024, CONSULTATION DATE:  08/16/24 REFERRING MD:  TR[facility], CHIEF COMPLAINT:  chest tube management   History of Present Illness:  73 yo male transferred from [facility] after 2 days of sob. Pt was see at urgent care 2 days ago and prescribed azithromycin . This did not provide any benefit per pt and today as his sob worsened he called EMS. Upon their arrival pt was noted to have oxygen  saturation of 66% on RA. There is no reports of fever/chills/recent sick contacts, no n/v/d. Viral panel negative. Chest imaging revealed R apical ptx with RLL opacity/effusion. CT chest with R hydropneumothorax.   Pt was subsequently transferred to [facility] and ccm was consulted for chest tube management.   Pertinent  Medical History  HTN Hyperlipidemia Obesity Osa (not on cpap) Prostate cancer (s/p surgery) Tobacco abuse  Significant Hospital Events: Including procedures, antibiotic start and stop dates in addition to other pertinent events   Admitted to hospitalist service after chest tube placement 11/2 Transferred to [facility] 11/3  Interim History / Subjective:  No new complains 230 cc output in 24 hrs Underwent CT chest yesterday    Objective    Blood pressure (!) 142/52, pulse 63, temperature 98.2 F (36.8 C), temperature source Oral, resp. rate 18, height 5' 7.5 (1.715 m), weight 106 kg, SpO2 94%.        Intake/Output Summary (Last 24 hours) at 08/20/2024 1224 Last data filed at 08/20/2024 9376 Gross per 24 hour  Intake 200 ml  Output 1678 ml  Net -1478 ml   Filed Weights   08/18/24 0421 08/19/24 0401 08/20/24 0500  Weight: 106.3 kg 106.3 kg 106 kg    Examination: General: nad, resting comfortably in bed HENT: ncat, perrla, eomi, mmmp Lungs: diminished R base, cta otherwise Cardiovascular: rrr Abdomen: nt, mildly distended bs + Extremities: no c/c/e Neuro: no focal deficits GU: deferred  Resolved problem  list   Assessment and Plan   Acute hypoxic resp failure Acute exacerbation COPD Hydropneumothorax CAP Cirrhosis on imaging  -titrate supplemental oxygen  as able for sat >88% -steroids, nebs per primary -family reported prior hx of effusion. Upon review of chart he had small left and moderate right effusion in 10/2022 and fluid was transudative then. based on hx and elevated BNP looks like pt has underlying HFpEF. Wife reports intermittent leg swelling. -  Pt does reports cough and sputum production prior to this admission. Agree with azithromycin  and steroids for now. LDH elevated on recent pleural fluid. Cultures negative. No complicated pneumonia or effusion on CT chest. Would complete total 10 days of antibiotics. - remove suction on chest tube.  Continued drainage due to Underlying cirrhosis and HFpEF . Recommend starting diuretics -chest xray in am - recommend GI evaluation for cirrhosis, likely outpatient -all other issues per primary  Updated wife over the phone   Labs   CBC: Recent Labs  Lab 08/15/24 1541 08/17/24 0300 08/18/24 0418 08/19/24 0325 08/20/24 0249  WBC 9.1 14.7* 13.7* 10.2 9.2  NEUTROABS 6.4 12.7* 11.8* 8.7* 6.8  HGB 14.4 13.9 13.9 13.6 13.9  HCT 46.7 44.5 45.1 43.9 44.0  MCV 95.1 94.1 93.2 92.6 91.1  PLT 239 245 221 192 192    Basic Metabolic Panel: Recent Labs  Lab 08/17/24 0300 08/18/24 0418 08/18/24 2240 08/19/24 0325 08/20/24 0249  NA 137 140 136 138 137  K 4.9 4.3 4.7 4.4 3.6  CL 99 98  96* 99 96*  CO2 27 32 32 28 29  GLUCOSE 350* 160* 410* 260* 176*  BUN 26* 27* 32* 31* 25*  CREATININE 1.63* 1.43* 1.50* 1.36* 1.20  CALCIUM  8.7* 8.8* 8.6* 8.7* 8.4*  MG 2.4 2.4  --   --   --   PHOS 3.7 3.9  --   --   --    GFR: Estimated Creatinine Clearance: 64.2 mL/min (by C-G formula based on SCr of 1.2 mg/dL). Recent Labs  Lab 08/17/24 0300 08/18/24 0418 08/18/24 1447 08/19/24 0325 08/20/24 0249  PROCALCITON  --   --  <0.10  --   --   WBC  14.7* 13.7*  --  10.2 9.2    Liver Function Tests: Recent Labs  Lab 08/15/24 1541 08/16/24 0631 08/17/24 0300 08/18/24 0418  AST 15  --  12* 10*  ALT 10  --  12 11  ALKPHOS 65  --  48 43  BILITOT 0.5  --  0.7 0.7  PROT 7.2 6.4* 6.6 6.5  ALBUMIN  3.9  --  2.8* 2.8*   No results for input(s): LIPASE, AMYLASE in the last 168 hours. No results for input(s): AMMONIA in the last 168 hours.  ABG    Component Value Date/Time   HCO3 38.9 (H) 08/08/2022 1611   TCO2 30 12/01/2016 0924   O2SAT 84.4 08/08/2022 1611     Coagulation Profile: Recent Labs  Lab 08/18/24 0418  INR 1.0    Cardiac Enzymes: No results for input(s): CKTOTAL, CKMB, CKMBINDEX, TROPONINI in the last 168 hours.  HbA1C: Hgb A1c MFr Bld  Date/Time Value Ref Range Status  08/17/2024 03:00 AM 9.0 (H) 4.8 - 5.6 % Final    Comment:    (NOTE) Diagnosis of Diabetes The following HbA1c ranges recommended by the American Diabetes Association (ADA) may be used as an aid in the diagnosis of diabetes mellitus.  Hemoglobin             Suggested A1C NGSP%              Diagnosis  <5.7                   Non Diabetic  5.7-6.4                Pre-Diabetic  >6.4                   Diabetic  <7.0                   Glycemic control for                       adults with diabetes.    10/12/2022 10:14 AM 7.2 (H) 4.8 - 5.6 % Final    Comment:    (NOTE)         Prediabetes: 5.7 - 6.4         Diabetes: >6.4         Glycemic control for adults with diabetes: <7.0     CBG: Recent Labs  Lab 08/19/24 0614 08/19/24 1102 08/19/24 1610 08/19/24 2124 08/20/24 0511  GLUCAP 230* 251* 273* 208* 160*    Review of Systems:   As per HPI  Past Medical History:  He,  has a past medical history of Arthritis, Cancer (HCC), Chronic respiratory failure (HCC), Colon polyps, Cough, Diabetes mellitus without complication (HCC), Dyspnea, GERD (gastroesophageal reflux disease), H/O tooth extraction (week of 03/15/2015  ),  History of kidney stones, Hypercholesteremia, Hypertension, Lumbar stenosis with neurogenic claudication, Pneumonia, Pre-diabetes, Renal disorder, and Sleep apnea.   Surgical History:   Past Surgical History:  Procedure Laterality Date   APPENDECTOMY     BACK SURGERY     BIOPSY  12/03/2022   Procedure: BIOPSY;  Surgeon: Eartha Angelia Sieving, MD;  [facility];  Service: Gastroenterology;;   CHOLECYSTECTOMY     COLONOSCOPY  02/04/2012   Procedure: COLONOSCOPY;  Surgeon: Oneil DELENA Budge, MD;  [facility];  Service: Gastroenterology;  Laterality: N/A;   COLONOSCOPY N/A 09/22/2018   Procedure: COLONOSCOPY;  Surgeon: Budge Oneil, MD;  [facility];  Service: Gastroenterology;  Laterality: N/A;   COLONOSCOPY W/ POLYPECTOMY     COLONOSCOPY WITH PROPOFOL  N/A 10/15/2022   Procedure: COLONOSCOPY WITH PROPOFOL ;  Surgeon: Cindie Carlin POUR, DO;  [facility];  Service: Endoscopy;  Laterality: N/A;   COLONOSCOPY WITH PROPOFOL  N/A 12/03/2022   Procedure: COLONOSCOPY WITH PROPOFOL ;  Surgeon: Eartha Angelia Sieving, MD;  [facility];  Service: Gastroenterology;  Laterality: N/A;  12:00pm, asa 3   ENTEROSCOPY N/A 12/03/2022   Procedure: ENTEROSCOPY;  Surgeon: Eartha Angelia Sieving, MD;  [facility];  Service: Gastroenterology;  Laterality: N/A;  push enteroscopy   ESOPHAGOGASTRODUODENOSCOPY (EGD) WITH PROPOFOL  N/A 10/15/2022   Procedure: ESOPHAGOGASTRODUODENOSCOPY (EGD) WITH PROPOFOL ;  Surgeon: Cindie Carlin POUR, DO;  [facility];  Service: Endoscopy;  Laterality: N/A;   GIVENS CAPSULE STUDY N/A 11/14/2022   Procedure: GIVENS CAPSULE STUDY;  Surgeon: Shaaron Lamar HERO, MD;  [facility];  Service: Endoscopy;  Laterality: N/A;   HEMOSTASIS CLIP PLACEMENT  12/03/2022   Procedure: HEMOSTASIS CLIP PLACEMENT;  Surgeon: Eartha Angelia Sieving, MD;  [facility];  Service: Gastroenterology;;   HERNIA  REPAIR     Bilateral inguinal hernia repair X 4   HOT HEMOSTASIS  12/03/2022   Procedure: HOT HEMOSTASIS (ARGON PLASMA COAGULATION/BICAP);  Surgeon: Eartha Angelia, Sieving, MD;  [facility];  Service: Gastroenterology;;   hydrocelectomy     left knee arthroscopy     LITHOTRIPSY     LYMPHADENECTOMY Bilateral 03/22/2015   Procedure: PELVIC LYMPHADENECTOMY;  Surgeon: Ricardo Likens, MD;  Location: [facility];  Service: Urology;  Laterality: Bilateral;   NM MYOVIEW  LTD  2011   POLYPECTOMY  09/22/2018   Procedure: POLYPECTOMY;  Surgeon: Budge Oneil, MD;  [facility];  Service: Gastroenterology;;   POLYPECTOMY  10/15/2022   Procedure: POLYPECTOMY;  Surgeon: Cindie Carlin POUR, DO;  [facility];  Service: Endoscopy;;   POLYPECTOMY  12/03/2022   Procedure: POLYPECTOMY;  Surgeon: Eartha Angelia Sieving, MD;  [facility];  Service: Gastroenterology;;   ROBOT ASSISTED LAPAROSCOPIC RADICAL PROSTATECTOMY N/A 03/22/2015   Procedure: ROBOTIC ASSISTED LAPAROSCOPIC RADICAL PROSTATECTOMY WITH INDOCYANINE GREEN DYE INJECTION;  Surgeon: Ricardo Likens, MD;  Location: [facility];  Service: Urology;  Laterality: N/A;   ROBOTIC ASSISTED LAPAROSCOPIC LYSIS OF ADHESION  03/22/2015   Procedure: ROBOTIC ASSISTED LAPAROSCOPIC LYSIS OF ADHESION;  Surgeon: Ricardo Likens, MD;  Location: [facility];  Service: Urology;;   SHOULDER SURGERY Right    SUBMUCOSAL LIFTING INJECTION  12/03/2022   Procedure: SUBMUCOSAL LIFTING INJECTION;  Surgeon: Eartha Angelia Sieving, MD;  [facility];  Service: Gastroenterology;;   VASECTOMY       Social History:   reports that he has quit smoking. His smoking use included cigarettes. He has a 55 pack-year  smoking history. He has been exposed to tobacco smoke. He has never used smokeless tobacco. He reports that he does not currently use alcohol after a past usage of about 1.0 standard drink of alcohol per week. He reports that he does not  use drugs.   Family History:  His family history includes Heart failure in his father; Stroke in his father. There is no history of Colon cancer.   Allergies Allergies  Allergen Reactions   Lidocaine  Hives   Benzocaine Other (See Comments)    Blisters    Other Other (See Comments)    Novocaine - blisters in mouth     Home Medications  Prior to Admission medications   Medication Sig Start Date End Date Taking? Authorizing Provider  ALPRAZolam (XANAX) 0.5 MG tablet Take 0.5 mg by mouth at bedtime as needed for anxiety or sleep. 06/17/24   [provider]  azithromycin  (ZITHROMAX ) 250 MG tablet Take 250 mg by mouth as directed. 08/12/24   [provider]  bisoprolol  (ZEBETA ) 10 MG tablet Take 10 mg by mouth daily.    [provider]  Dapagliflozin Propanediol (FARXIGA PO) Take by mouth.    [provider]  docusate sodium  (COLACE) 100 MG capsule Take 100 mg by mouth daily.    [provider]  famotidine  (PEPCID ) 20 MG tablet Take 20 mg by mouth daily. 10/01/23   [provider]  ferrous sulfate  325 (65 FE) MG tablet Take 1 tablet (325 mg total) by mouth 3 (three) times daily with meals. Patient taking differently: Take 325 mg by mouth daily with breakfast. 11/15/22 07/28/23  Willette Adriana LABOR, MD  furosemide  (LASIX ) 40 MG tablet Take 1 tablet (40 mg total) by mouth daily as needed (swelling or shortness of breath). 05/02/23   Mallipeddi, Vishnu P, MD  glimepiride  (AMARYL ) 1 MG tablet Take 1 mg by mouth 2 (two) times daily. 09/18/22   [provider]  HYDROcodone -acetaminophen  (NORCO) 10-325 MG tablet Take 1 tablet by mouth 4 (four) times daily as needed for moderate pain (pain score 4-6). Takes 1/2 tablet 10/25/22   [provider]  ondansetron  (ZOFRAN ) 4 MG tablet Take 4 mg by mouth every 8 (eight) hours as needed. 10/01/23   [provider]  OXYGEN  Inhale 3 L into the lungs at bedtime.    [provider]   pantoprazole  (PROTONIX ) 40 MG tablet Take 1 tablet (40 mg total) by mouth daily. 08/09/22   Ricky Fines, MD  rosuvastatin  (CRESTOR ) 40 MG tablet TAKE ONE TABLET (40MG  TOTAL) BY MOUTH DAILY 02/09/24   Mallipeddi, Vishnu P, MD  traMADol (ULTRAM) 50 MG tablet Take 50 mg by mouth every 6 (six) hours as needed. 06/17/24   [provider]  valsartan  (DIOVAN ) 80 MG tablet TAKE ONE TABLET (80 MG TOTAL) BY MOUTH DAILY. 04/03/24   [name], MD  Vitamin D , Ergocalciferol , 50000 units CAPS Take 1 capsule by mouth once a week. 06/04/23   [provider]

## 2024-08-20 NOTE — Progress Notes (Signed)
 [facility]   ROUNDING   NOTE JAGDEEP ANCHETA FMW:[id number]  DOB: Dec 03, 1950  DOA: 08/15/2024  PCP: Leonce Lucie PARAS, PA-C  08/20/2024,2:37 PM  LOS: 5 days    Code Status: Full code     from: Home   73 year old white male quit smoking 2 years ago Chronic blood loss anemia with inpatient workup on admission 1/31 11/15/18/2024 colonoscopy showed 8 polyps EGD normal CKD 3 Prostate cancer status post prostatectomy 03/22/2015 Dr. Christopher Chronic respiratory failure on 3 to 4 L of oxygen -untreated sleep apnea Retinal branch occlusion on 2023 admission supposed to be on aspirin  Previous L4-L5 decompression Plif [facility] Dr. Alix 2019  Chronology  11/2 came to emergency room at [facility] with shortness of breath productive cough over 1 week and had completed azithromycin  as an outpatient-CO2 34 creatinine 1.4 RVP testing negative CXR small right apical pneumothorax new from prior right lower lobe opacity and effusion-CT chest right sided hydropneumothorax with dependent consolidation-Rx ceftriaxone  doxycycline transferred to [name] for [facility]CM to see--chest tube inserted 11/4 chest tube changed to waterseal   Assessment  & Plan :    Hydropneumothorax-right lower lobe opacity exudative by lights criteria  Chest tube culture NGTD 11/4, Gram stain no organisms only WBCs present-strep pneumo negative Legionella pending Patient was on azithromycin  since 11/4-pulmonary added ceftriaxone  11/5 as cannot rule out parapneumonic effusion Ct chest as above repeat chest x-ray ordered per pulmonary as per their orders in a.m. They are going to attempt diuretics-gave Lasix  40 IV today and resumed home dose 40 Chronic respiratory failure at baseline on 2 to 4 L Probable pneumonia COPD-mild acute exacerbation on admission Completed azithromycin -ceftriaxone  11/5-->11/11 Steroids discontinued 11/5 Continuous Tussionex 5 every 12 Mucinex 1 tab twice daily, DuoNeb every 4 as needed Diabetes mellitus with hyperglycemia Amaryl  1  mg resuming in a.m. CBGs better controlled Moderate sliding scale, 4 units 3 times daily meal coverage no at bedtime needed, continue AKI superimposed on CKD 3 AA Farxiga held on admission resume closer to discharge-Lasix  resumed as above Continue valsartan  lower dose/changed to AVaPro  75 Previous prostate cancer with resection in 2016 Outpatient PSA monitoring as per his urologist--- nuclear body scan 03/02/2024 was negative Cirrhosis Outpatient workup per GI Previous GI bleed See above-continue Protonix  40 daily Pepcid  20 daily HTN Moderately controlled Continue bisoprolol  10 daily, Crestor  40 daily  Data Reviewed today:   Sodium 137 potassium 3.6 chloride 96 BUN/creatinine 25/1.2 WBC 9.2 platelet 192  DVT prophylaxis: Lovenox   Status is: Inpatient Remains inpatient appropriate because:   Requires further management of chest tube    Dispo/Global plan: Hopeful for discharge home eventually but making poor effort with movement  Time 45   Subjective:   Looks well feels fair no chest pain no fever has passed some urine on the Lasix  No nausea no vomiting No cough  Objective + exam Vitals:   08/20/24 0500 08/20/24 0858 08/20/24 1236 08/20/24 1300  BP: (!) 147/60 (!) 142/52 (!) 110/46   Pulse: 65 63 (!) 58   Resp:  18 15 15   Temp: 98 F (36.7 C) 98.2 F (36.8 C)    TempSrc: Oral Oral Oral   SpO2:  94% 94% 94%  Weight: 106 kg     Height:       Filed Weights   08/18/24 0421 08/19/24 0401 08/20/24 0500  Weight: 106.3 kg 106.3 kg 106 kg     Examination:  Thick neck Mallampati 4 crackles posterior laterally S1-S2 no murmur seems to be sinus overall  Abdomen is soft obese nontender has right upper quadrant scar Trace lower extremity edema  Scheduled Meds:  bisoprolol   10 mg Oral Daily   calcium  carbonate  1 tablet Oral Q breakfast   dextromethorphan-guaiFENesin  1 tablet Oral BID   enoxaparin  (LOVENOX ) injection  40 mg Subcutaneous Q24H   famotidine   20 mg Oral  Daily   insulin  aspart  0-15 Units Subcutaneous TID WC   insulin  aspart  4 Units Subcutaneous TID WC   insulin  glargine-yfgn  15 Units Subcutaneous QHS   irbesartan   75 mg Oral Daily   pantoprazole   40 mg Oral Daily   rosuvastatin   40 mg Oral Daily   Continuous Infusions:  cefTRIAXone  (ROCEPHIN )  IV 2 g (08/20/24 1317)   acetaminophen , chlorpheniramine-HYDROcodone , ipratropium-albuterol , oxyCODONE  **OR** oxyCODONE   Jai-Gurmukh Shiniqua Groseclose, MD  [facility]

## 2024-08-20 NOTE — Plan of Care (Signed)

## 2024-08-21 ENCOUNTER — Telehealth: Payer: Self-pay

## 2024-08-21 ENCOUNTER — Inpatient Hospital Stay (HOSPITAL_COMMUNITY)

## 2024-08-21 DIAGNOSIS — J9811 Atelectasis: Secondary | ICD-10-CM | POA: Diagnosis not present

## 2024-08-21 DIAGNOSIS — Z4682 Encounter for fitting and adjustment of non-vascular catheter: Secondary | ICD-10-CM | POA: Diagnosis not present

## 2024-08-21 DIAGNOSIS — Z981 Arthrodesis status: Secondary | ICD-10-CM | POA: Diagnosis not present

## 2024-08-21 DIAGNOSIS — J9601 Acute respiratory failure with hypoxia: Secondary | ICD-10-CM | POA: Diagnosis not present

## 2024-08-21 DIAGNOSIS — J90 Pleural effusion, not elsewhere classified: Secondary | ICD-10-CM | POA: Diagnosis not present

## 2024-08-21 DIAGNOSIS — J948 Other specified pleural conditions: Secondary | ICD-10-CM | POA: Diagnosis not present

## 2024-08-21 DIAGNOSIS — I700 Atherosclerosis of aorta: Secondary | ICD-10-CM | POA: Diagnosis not present

## 2024-08-21 DIAGNOSIS — J441 Chronic obstructive pulmonary disease with (acute) exacerbation: Secondary | ICD-10-CM | POA: Diagnosis not present

## 2024-08-21 DIAGNOSIS — N179 Acute kidney failure, unspecified: Secondary | ICD-10-CM | POA: Diagnosis not present

## 2024-08-21 LAB — CBC WITH DIFFERENTIAL/PLATELET
Abs Immature Granulocytes: 0.03 K/uL (ref 0.00–0.07)
Basophils Absolute: 0.1 K/uL (ref 0.0–0.1)
Basophils Relative: 1 %
Eosinophils Absolute: 0.7 K/uL — ABNORMAL HIGH (ref 0.0–0.5)
Eosinophils Relative: 7 %
HCT: 45.2 % (ref 39.0–52.0)
Hemoglobin: 14.4 g/dL (ref 13.0–17.0)
Immature Granulocytes: 0 %
Lymphocytes Relative: 13 %
Lymphs Abs: 1.2 K/uL (ref 0.7–4.0)
MCH: 28.9 pg (ref 26.0–34.0)
MCHC: 31.9 g/dL (ref 30.0–36.0)
MCV: 90.8 fL (ref 80.0–100.0)
Monocytes Absolute: 0.6 K/uL (ref 0.1–1.0)
Monocytes Relative: 6 %
Neutro Abs: 6.8 K/uL (ref 1.7–7.7)
Neutrophils Relative %: 73 %
Platelets: 191 K/uL (ref 150–400)
RBC: 4.98 MIL/uL (ref 4.22–5.81)
RDW: 15.9 % — ABNORMAL HIGH (ref 11.5–15.5)
WBC: 9.3 K/uL (ref 4.0–10.5)
nRBC: 0 % (ref 0.0–0.2)

## 2024-08-21 LAB — PH, BODY FLUID: pH, Fluid: 7.9

## 2024-08-21 LAB — BASIC METABOLIC PANEL WITH GFR
Anion gap: 12 (ref 5–15)
BUN: 23 mg/dL (ref 8–23)
CO2: 37 mmol/L — ABNORMAL HIGH (ref 22–32)
Calcium: 8.7 mg/dL — ABNORMAL LOW (ref 8.9–10.3)
Chloride: 92 mmol/L — ABNORMAL LOW (ref 98–111)
Creatinine, Ser: 1.54 mg/dL — ABNORMAL HIGH (ref 0.61–1.24)
GFR, Estimated: 47 mL/min — ABNORMAL LOW (ref >60)
Glucose, Bld: 135 mg/dL — ABNORMAL HIGH (ref 70–99)
Potassium: 3.3 mmol/L — ABNORMAL LOW (ref 3.5–5.1)
Sodium: 141 mmol/L (ref 135–145)

## 2024-08-21 LAB — GLUCOSE, CAPILLARY
Glucose-Capillary: 160 mg/dL — ABNORMAL HIGH (ref 70–99)
Glucose-Capillary: 305 mg/dL — ABNORMAL HIGH (ref 70–99)
Glucose-Capillary: 277 mg/dL — ABNORMAL HIGH (ref 70–99)
Glucose-Capillary: 238 mg/dL — ABNORMAL HIGH (ref 70–99)
Glucose-Capillary: 185 mg/dL — ABNORMAL HIGH (ref 70–99)
Glucose-Capillary: 171 mg/dL — ABNORMAL HIGH (ref 70–99)
Glucose-Capillary: 186 mg/dL — ABNORMAL HIGH (ref 70–99)

## 2024-08-21 LAB — HOUSE ACCOUNT TRACKING

## 2024-08-21 MED ORDER — LEVOFLOXACIN 500 MG PO TABS
500.0000 mg | ORAL_TABLET | Freq: Every day | ORAL | Status: DC
  Administered 2024-08-21 – 2024-08-22 (×2): 500 mg via ORAL
  Filled 2024-08-21 (×3): qty 1

## 2024-08-21 MED ORDER — ACETAZOLAMIDE 250 MG PO TABS
250.0000 mg | ORAL_TABLET | Freq: Two times a day (BID) | ORAL | Status: AC
  Administered 2024-08-21 (×2): 250 mg via ORAL
  Filled 2024-08-21 (×2): qty 1

## 2024-08-21 MED ORDER — FUROSEMIDE 20 MG PO TABS
20.0000 mg | ORAL_TABLET | Freq: Every day | ORAL | Status: DC
  Administered 2024-08-21 – 2024-08-22 (×2): 20 mg via ORAL
  Filled 2024-08-21 (×2): qty 1

## 2024-08-21 MED ORDER — POTASSIUM CHLORIDE CRYS ER 20 MEQ PO TBCR
40.0000 meq | EXTENDED_RELEASE_TABLET | Freq: Every day | ORAL | Status: DC
  Administered 2024-08-21 – 2024-08-22 (×2): 40 meq via ORAL
  Filled 2024-08-21 (×2): qty 2

## 2024-08-21 NOTE — Progress Notes (Signed)
 Chest tube clamped at 0815 as per MD order.

## 2024-08-21 NOTE — Progress Notes (Addendum)
 [facility]   ROUNDING   NOTE Mark Cannon FMW:[id number]  DOB: [birth year]  DOA: 08/15/2024  PCP: Mark Lucie PARAS, PA-C  08/21/2024,4:13 PM  LOS: 6 days    Code Status: Full code     from: Home   73 year old white male quit smoking 2 years ago Chronic blood loss anemia with inpatient workup on admission 1/31 11/15/18/2024 colonoscopy showed 8 polyps EGD normal CKD 3 Prostate cancer status post prostatectomy 03/22/2015 Dr. Christopher Chronic respiratory failure on 3 to 4 L of oxygen -untreated sleep apnea Retinal branch occlusion on 2023 admission supposed to be on aspirin  Previous L4-L5 decompression Plif [facility] Dr. Alix 2019  Chronology  11/2 came to emergency room at [facility] with shortness of breath productive cough over 1 week and had completed azithromycin  as an outpatient-CO2 34 creatinine 1.4 RVP testing negative CXR small right apical pneumothorax new from prior right lower lobe opacity and effusion-CT chest right sided hydropneumothorax with dependent consolidation-Rx ceftriaxone  doxycycline transferred to [name] for [facility] to see--chest tube inserted 11/4 chest tube changed to waterseal 11/8 chest x-ray a.m. shows clearing of infiltrates  Assessment  & Plan :    Hydropneumothorax-right lower lobe opacity?  Pneumonia +/- heart failure Acute superimposed on HFpEF Chest tube culture NGTD 11/4, Gram stain no organisms only WBCs present-strep pneumo negative Legionella pending Diuresed aggressively -9 L weight down from 106 to 102 kg currently Lasix  20 daily given large diuresis and electrolyte abnormalities as below Change Rocephin  to Levaquin to complete therapy total 10 days from 11/2 ending 11/11 Tube clamp as per radiology-putting out minimal fluid now Chronic respiratory failure at baseline on 2 to 4 L COPD-mild acute exacerbation on admission Steroids discontinued 11/5 Continuous Tussionex 5 every 12 Mucinex 1 tab twice daily, DuoNeb every 4 as needed Desats to 82% and could not afford  oxygen  in the outpatient setting has not been on it in a year his baseline walking distances only about 20 to 30 feet Need a durable clear plan to be able to discharge him home with oxygen  and explained this to him carefully Diabetes mellitus with hyperglycemia Amaryl  1 mg current meds Lantus 15 4 units 3 times daily meals moderate sliding scale CBGs ranging 230-300 Addressed in the next several days AKI superimposed on CKD 3 AA Mild AKI iatrogenic from Lasix  Respiratory compensated alkalosis Mild hypokalemia-replaced with K-Dur 40-check a.m. [name] held on admission resume closer to discharge-Lasix  resumed as above Stopped ARB 11/8--- giving 2 doses of Diamox to 50 Previous prostate cancer with resection in 2016 Outpatient PSA monitoring as per his urologist--- nuclear body scan 03/02/2024 was negative Cirrhosis Outpatient workup per GI Previous GI bleed See above-continue Protonix  40 daily Pepcid  20 daily HTN Moderately controlled Continue bisoprolol  10 daily, Crestor  40 daily  Data Reviewed today:   Sodium 141 potassium 3.3 chloride 92 bicarb 37 BUN/creatinine 23/1.5 WBC 9.3 hemoglobin 14.4 platelet 191  DVT prophylaxis: Lovenox   Status is: Inpatient Remains inpatient appropriate because:   Requires further management of chest tube    Dispo/Global plan: Hopeful for discharge home need to figure out home oxygen  situation etc.  Time 45   Subjective:   Awake coherent sitting up but desats to 89-90 when off oxygen  See desat screen See above No pain no fever no chills no nausea No chest pain  Objective + exam Vitals:   08/21/24 1128 08/21/24 1306 08/21/24 1456 08/21/24 1510  BP: (!) 123/53   (!) 129/54  Pulse: 61 63 68 66  Resp: 18 13 17 20   Temp: 98.5 F (36.9 C)   98.6 F (37 C)  TempSrc: Oral   Oral  SpO2: 94% 90% (!) 82% 98%  Weight:      Height:       Filed Weights   08/19/24 0401 08/20/24 0500 08/21/24 0616  Weight: 106.3 kg 106 kg 102.2 kg      Examination:  Thick neck Mallampati 4 crackles posterior laterally S1-S2 no murmur seems to be sinus overall Chest is relatively clear chest tube is clamped Abdomen is obese No lower extremity edema to me Cannot appreciate JVD  Scheduled Meds:  acetaZOLAMIDE  250 mg Oral BID   bisoprolol   10 mg Oral Daily   calcium  carbonate  1 tablet Oral Q breakfast   dextromethorphan-guaiFENesin  1 tablet Oral BID   enoxaparin  (LOVENOX ) injection  40 mg Subcutaneous Q24H   famotidine   20 mg Oral Daily   furosemide   20 mg Oral Daily   glimepiride   1 mg Oral BID WC   insulin  aspart  0-15 Units Subcutaneous TID WC   insulin  aspart  4 Units Subcutaneous TID WC   insulin  glargine-yfgn  15 Units Subcutaneous QHS   levofloxacin  500 mg Oral Daily   pantoprazole   40 mg Oral Daily   potassium chloride   40 mEq Oral Daily   rosuvastatin   40 mg Oral Daily   Continuous Infusions:   acetaminophen , chlorpheniramine-HYDROcodone , ipratropium-albuterol , oxyCODONE  **OR** oxyCODONE   Jai-Gurmukh Peniel Biel, MD  [facility]

## 2024-08-21 NOTE — Progress Notes (Signed)
 NAME:  Mark Cannon, MRN:  [id number], DOB:  [birth year], LOS: 6 ADMISSION DATE:  08/15/2024, CONSULTATION DATE:  08/16/24 REFERRING MD:  TRH, CHIEF COMPLAINT:  chest tube management   History of Present Illness:  73 yo male transferred from [facility] after 2 days of sob. Pt was see at urgent care 2 days ago and prescribed azithromycin . This did not provide any benefit per pt and today as his sob worsened he called EMS. Upon their arrival pt was noted to have oxygen  saturation of 66% on RA. There is no reports of fever/chills/recent sick contacts, no n/v/d. Viral panel negative. Chest imaging revealed R apical ptx with RLL opacity/effusion. CT chest with R hydropneumothorax.   Pt was subsequently transferred to [facility] and ccm was consulted for chest tube management.    Pertinent  Medical History  HTN Hyperlipidemia Obesity [place] (not on cpap) Prostate cancer (s/p surgery) Tobacco abuse   Significant Hospital Events: Including procedures, antibiotic start and stop dates in addition to other pertinent events   Admitted to hospitalist service after chest tube placement 11/2 Transferred to [facility] 11/3  Interim History / Subjective:  No new complains Minimal output overnight Chest tube clamped this morning    Objective    Blood pressure (!) 129/54, pulse 66, temperature 98.6 F (37 C), temperature source Oral, resp. rate 20, height 5' 7.5 (1.715 m), weight 102.2 kg, SpO2 98%.        Intake/Output Summary (Last 24 hours) at 08/21/2024 1702 Last data filed at 08/21/2024 1500 Gross per 24 hour  Intake 1534 ml  Output 1300 ml  Net 234 ml   Filed Weights   08/19/24 0401 08/20/24 0500 08/21/24 0616  Weight: 106.3 kg 106 kg 102.2 kg    Examination: General: nad, resting comfortably in bed HENT: ncat, perrla, eomi, mmmp Lungs: diminished R base, cta otherwise Cardiovascular: rrr Abdomen: nt, mildly distended bs + Extremities: no c/c/e Neuro: no focal deficits GU: deferred  Resolved  problem list   Assessment and Plan   Acute hypoxic resp failure Acute exacerbation COPD Hydropneumothorax CAP Cirrhosis vs fatty liver  -titrate supplemental oxygen  as able for sat >88% -steroids, nebs per primary -family reported prior hx of effusion. Upon review of chart he had small left and moderate right effusion in 10/2022 and fluid was transudative then. based on hx and elevated BNP looks like pt has underlying HFpEF. Wife reports intermittent leg swelling. -  Pt does reports cough and sputum production prior to this admission. Agree with azithromycin  and steroids for now. LDH elevated on recent pleural fluid. Cultures negative. No complicated pneumonia or effusion on CT chest. Would complete 5 more days of antibiotics. - chest tube output low since starting diuretics. No residual pockets in ct chest. Chest tube clamped since morning, no residual pneumothorax. Will remove chest tube - recommend outpatient GI evaluation for cirrhosis -all other issues per primary  outpatient follow up with Dr Darlean in [place] clinic within 2  weeks   Labs   CBC: Recent Labs  Lab 08/17/24 0300 08/18/24 0418 08/19/24 0325 08/20/24 0249 08/21/24 0331  WBC 14.7* 13.7* 10.2 9.2 9.3  NEUTROABS 12.7* 11.8* 8.7* 6.8 6.8  HGB 13.9 13.9 13.6 13.9 14.4  HCT 44.5 45.1 43.9 44.0 45.2  MCV 94.1 93.2 92.6 91.1 90.8  PLT 245 221 192 192 191    Basic Metabolic Panel: Recent Labs  Lab 08/17/24 0300 08/18/24 0418 08/18/24 2240 08/19/24 0325 08/20/24 0249 08/21/24 0331  NA 137 140 136  138 137 141  K 4.9 4.3 4.7 4.4 3.6 3.3*  CL 99 98 96* 99 96* 92*  CO2 27 32 32 28 29 37*  GLUCOSE 350* 160* 410* 260* 176* 135*  BUN 26* 27* 32* 31* 25* 23  CREATININE 1.63* 1.43* 1.50* 1.36* 1.20 1.54*  CALCIUM  8.7* 8.8* 8.6* 8.7* 8.4* 8.7*  MG 2.4 2.4  --   --   --   --   PHOS 3.7 3.9  --   --   --   --    GFR: Estimated Creatinine Clearance: 49.1 mL/min (A) (by C-G formula based on SCr of 1.54 mg/dL  (H)). Recent Labs  Lab 08/18/24 0418 08/18/24 1447 08/19/24 0325 08/20/24 0249 08/21/24 0331  PROCALCITON  --  <0.10  --   --   --   WBC 13.7*  --  10.2 9.2 9.3    Liver Function Tests: Recent Labs  Lab 08/15/24 1541 08/16/24 0631 08/17/24 0300 08/18/24 0418  AST 15  --  12* 10*  ALT 10  --  12 11  ALKPHOS 65  --  48 43  BILITOT 0.5  --  0.7 0.7  PROT 7.2 6.4* 6.6 6.5  ALBUMIN  3.9  --  2.8* 2.8*   No results for input(s): LIPASE, AMYLASE in the last 168 hours. No results for input(s): AMMONIA in the last 168 hours.  ABG    Component Value Date/Time   HCO3 38.9 (H) 08/08/2022 1611   TCO2 30 12/01/2016 0924   O2SAT 84.4 08/08/2022 1611     Coagulation Profile: Recent Labs  Lab 08/18/24 0418  INR 1.0    Cardiac Enzymes: No results for input(s): CKTOTAL, CKMB, CKMBINDEX, TROPONINI in the last 168 hours.  HbA1C: Hgb A1c MFr Bld  Date/Time Value Ref Range Status  08/17/2024 03:00 AM 9.0 (H) 4.8 - 5.6 % Final    Comment:    (NOTE) Diagnosis of Diabetes The following HbA1c ranges recommended by the American Diabetes Association (ADA) may be used as an aid in the diagnosis of diabetes mellitus.  Hemoglobin             Suggested A1C NGSP%              Diagnosis  <5.7                   Non Diabetic  5.7-6.4                Pre-Diabetic  >6.4                   Diabetic  <7.0                   Glycemic control for                       adults with diabetes.    10/12/2022 10:14 AM 7.2 (H) 4.8 - 5.6 % Final    Comment:    (NOTE)         Prediabetes: 5.7 - 6.4         Diabetes: >6.4         Glycemic control for adults with diabetes: <7.0     CBG: Recent Labs  Lab 08/21/24 0614 08/21/24 1001 08/21/24 1127 08/21/24 1304 08/21/24 1511  GLUCAP 160* 305* 277* 238* 185*    Review of Systems:   As per HPI  Past Medical History:  He,  has a past medical history  of Arthritis, Cancer (HCC), Chronic respiratory failure (HCC), Colon  polyps, Cough, Diabetes mellitus without complication (HCC), Dyspnea, GERD (gastroesophageal reflux disease), H/O tooth extraction (week of 03/15/2015 ), History of kidney stones, Hypercholesteremia, Hypertension, Lumbar stenosis with neurogenic claudication, Pneumonia, Pre-diabetes, Renal disorder, and Sleep apnea.   Surgical History:   Past Surgical History:  Procedure Laterality Date   APPENDECTOMY     BACK SURGERY     BIOPSY  12/03/2022   Procedure: BIOPSY;  Surgeon: Eartha Angelia Sieving, MD;  [facility];  Service: Gastroenterology;;   CHOLECYSTECTOMY     COLONOSCOPY  02/04/2012   Procedure: COLONOSCOPY;  Surgeon: Oneil DELENA Budge, MD;  [facility];  Service: Gastroenterology;  Laterality: N/A;   COLONOSCOPY N/A 09/22/2018   Procedure: COLONOSCOPY;  Surgeon: Budge Oneil, MD;  [facility];  Service: Gastroenterology;  Laterality: N/A;   COLONOSCOPY W/ POLYPECTOMY     COLONOSCOPY WITH PROPOFOL  N/A 10/15/2022   Procedure: COLONOSCOPY WITH PROPOFOL ;  Surgeon: Cindie Carlin POUR, DO;  [facility];  Service: Endoscopy;  Laterality: N/A;   COLONOSCOPY WITH PROPOFOL  N/A 12/03/2022   Procedure: COLONOSCOPY WITH PROPOFOL ;  Surgeon: Eartha Angelia Sieving, MD;  [facility];  Service: Gastroenterology;  Laterality: N/A;  12:00pm, asa 3   ENTEROSCOPY N/A 12/03/2022   Procedure: ENTEROSCOPY;  Surgeon: Eartha Angelia Sieving, MD;  [facility];  Service: Gastroenterology;  Laterality: N/A;  push enteroscopy   ESOPHAGOGASTRODUODENOSCOPY (EGD) WITH PROPOFOL  N/A 10/15/2022   Procedure: ESOPHAGOGASTRODUODENOSCOPY (EGD) WITH PROPOFOL ;  Surgeon: Cindie Carlin POUR, DO;  [facility];  Service: Endoscopy;  Laterality: N/A;   GIVENS CAPSULE STUDY N/A 11/14/2022   Procedure: GIVENS CAPSULE STUDY;  Surgeon: Shaaron Lamar HERO, MD;  [facility];  Service: Endoscopy;  Laterality: N/A;   HEMOSTASIS CLIP PLACEMENT  12/03/2022    Procedure: HEMOSTASIS CLIP PLACEMENT;  Surgeon: Eartha Angelia Sieving, MD;  [facility];  Service: Gastroenterology;;   HERNIA REPAIR     Bilateral inguinal hernia repair X 4   HOT HEMOSTASIS  12/03/2022   Procedure: HOT HEMOSTASIS (ARGON PLASMA COAGULATION/BICAP);  Surgeon: Eartha Angelia, Sieving, MD;  [facility]: [facility];  Service: Gastroenterology;;   hydrocelectomy     left knee arthroscopy     LITHOTRIPSY     LYMPHADENECTOMY Bilateral 03/22/2015   Procedure: PELVIC LYMPHADENECTOMY;  Surgeon: Ricardo Likens, MD;  Location: [facility];  Service: Urology;  Laterality: Bilateral;   NM MYOVIEW  LTD  2011   POLYPECTOMY  09/22/2018   Procedure: POLYPECTOMY;  Surgeon: Budge Oneil, MD;  [facility];  Service: Gastroenterology;;   POLYPECTOMY  10/15/2022   Procedure: POLYPECTOMY;  Surgeon: Cindie Carlin POUR, DO;  [facility];  Service: Endoscopy;;   POLYPECTOMY  12/03/2022   Procedure: POLYPECTOMY;  Surgeon: Eartha Angelia Sieving, MD;  [facility];  Service: Gastroenterology;;   ROBOT ASSISTED LAPAROSCOPIC RADICAL PROSTATECTOMY N/A 03/22/2015   Procedure: ROBOTIC ASSISTED LAPAROSCOPIC RADICAL PROSTATECTOMY WITH INDOCYANINE GREEN DYE INJECTION;  Surgeon: Ricardo Likens, MD;  Location: [facility];  Service: Urology;  Laterality: N/A;   ROBOTIC ASSISTED LAPAROSCOPIC LYSIS OF ADHESION  03/22/2015   Procedure: ROBOTIC ASSISTED LAPAROSCOPIC LYSIS OF ADHESION;  Surgeon: Ricardo Likens, MD;  Location: [facility];  Service: Urology;;   SHOULDER SURGERY Right    SUBMUCOSAL LIFTING INJECTION  12/03/2022   Procedure: SUBMUCOSAL LIFTING INJECTION;  Surgeon: Eartha Angelia Sieving, MD;  [facility];  Service: Gastroenterology;;  VASECTOMY       Social History:   reports that he has quit smoking. His smoking use included cigarettes. He has a 55 pack-year smoking history. He has been exposed to tobacco smoke. He has never used smokeless  tobacco. He reports that he does not currently use alcohol after a past usage of about 1.0 standard drink of alcohol per week. He reports that he does not use drugs.   Family History:  His family history includes Heart failure in his father; Stroke in his father. There is no history of Colon cancer.   Allergies Allergies  Allergen Reactions   Lidocaine  Hives   Benzocaine Other (See Comments)    Blisters    Other Other (See Comments)    Novocaine - blisters in mouth     Home Medications  Prior to Admission medications   Medication Sig Start Date End Date Taking? Authorizing Provider  ALPRAZolam (XANAX) 0.5 MG tablet Take 0.5 mg by mouth at bedtime as needed for anxiety or sleep. 06/17/24   [provider]  azithromycin  (ZITHROMAX ) 250 MG tablet Take 250 mg by mouth as directed. 08/12/24   [provider]  bisoprolol  (ZEBETA ) 10 MG tablet Take 10 mg by mouth daily.    [provider]  Dapagliflozin Propanediol (FARXIGA PO) Take by mouth.    [provider]  docusate sodium  (COLACE) 100 MG capsule Take 100 mg by mouth daily.    [provider]  famotidine  (PEPCID ) 20 MG tablet Take 20 mg by mouth daily. 10/01/23   [provider]  ferrous sulfate  325 (65 FE) MG tablet Take 1 tablet (325 mg total) by mouth 3 (three) times daily with meals. Patient taking differently: Take 325 mg by mouth daily with breakfast. 11/15/22 07/28/23  Shahmehdi, Adriana LABOR, MD  furosemide  (LASIX ) 40 MG tablet Take 1 tablet (40 mg total) by mouth daily as needed (swelling or shortness of breath). 05/02/23   Mallipeddi, Vishnu P, MD  glimepiride  (AMARYL ) 1 MG tablet Take 1 mg by mouth 2 (two) times daily. 09/18/22   [provider]  HYDROcodone -acetaminophen  (NORCO) 10-325 MG tablet Take 1 tablet by mouth 4 (four) times daily as needed for moderate pain (pain score 4-6). Takes 1/2 tablet 10/25/22   [provider]  ondansetron  (ZOFRAN ) 4 MG tablet Take 4  mg by mouth every 8 (eight) hours as needed. 10/01/23   [provider]  OXYGEN  Inhale 3 L into the lungs at bedtime.    [provider]  pantoprazole  (PROTONIX ) 40 MG tablet Take 1 tablet (40 mg total) by mouth daily. 08/09/22   Ricky Fines, MD  rosuvastatin  (CRESTOR ) 40 MG tablet TAKE ONE TABLET (40MG  TOTAL) BY MOUTH DAILY 02/09/24   Mallipeddi, Vishnu P, MD  traMADol (ULTRAM) 50 MG tablet Take 50 mg by mouth every 6 (six) hours as needed. 06/17/24   [provider]  valsartan  (DIOVAN ) 80 MG tablet TAKE ONE TABLET (80 MG TOTAL) BY MOUTH DAILY. 04/03/24   [name], MD  Vitamin D , Ergocalciferol , 50000 units CAPS Take 1 capsule by mouth once a week. 06/04/23   [provider]

## 2024-08-21 NOTE — Progress Notes (Signed)
 Patient's Spo2 dropped to 82% while pt was resting in the bed, O2 resumed at 2lits via [place].    08/21/24 1456  Vitals  Pulse Rate 68  Pulse Rate Source Monitor  ECG Heart Rate 72  Resp 17  MEWS COLOR  MEWS Score Color Green  Oxygen  Therapy  SpO2 (!) 82 %  O2 Device Room Air  MEWS Score  MEWS Temp 0  MEWS Systolic 0  MEWS Pulse 0  MEWS RR 0  MEWS LOC 0  MEWS Score 0

## 2024-08-21 NOTE — Telephone Encounter (Signed)
 Please set up outpatient follow up with Dr Darlean in [place] clinic within 2  weeks

## 2024-08-21 NOTE — Progress Notes (Signed)
 Chest tube removed as per the order, occlusive dressing applied and educated the pt to keep the dressing on for 24 hours, patient will be on bedrest for an hour, V/S stable.

## 2024-08-21 NOTE — Plan of Care (Signed)
 ?  Problem: Clinical Measurements: ?Goal: Will remain free from infection ?Outcome: Progressing ?  ?

## 2024-08-22 DIAGNOSIS — J441 Chronic obstructive pulmonary disease with (acute) exacerbation: Secondary | ICD-10-CM | POA: Diagnosis not present

## 2024-08-22 LAB — GLUCOSE, CAPILLARY
Glucose-Capillary: 179 mg/dL — ABNORMAL HIGH (ref 70–99)
Glucose-Capillary: 199 mg/dL — ABNORMAL HIGH (ref 70–99)

## 2024-08-22 LAB — CBC WITH DIFFERENTIAL/PLATELET
Abs Immature Granulocytes: 0.03 K/uL (ref 0.00–0.07)
Basophils Absolute: 0 K/uL (ref 0.0–0.1)
Basophils Relative: 0 %
Eosinophils Absolute: 0.7 K/uL — ABNORMAL HIGH (ref 0.0–0.5)
Eosinophils Relative: 8 %
HCT: 42.3 % (ref 39.0–52.0)
Hemoglobin: 13.5 g/dL (ref 13.0–17.0)
Immature Granulocytes: 0 %
Lymphocytes Relative: 13 %
Lymphs Abs: 1.2 K/uL (ref 0.7–4.0)
MCH: 29.1 pg (ref 26.0–34.0)
MCHC: 31.9 g/dL (ref 30.0–36.0)
MCV: 91.2 fL (ref 80.0–100.0)
Monocytes Absolute: 0.6 K/uL (ref 0.1–1.0)
Monocytes Relative: 6 %
Neutro Abs: 6.5 K/uL (ref 1.7–7.7)
Neutrophils Relative %: 73 %
Platelets: 178 K/uL (ref 150–400)
RBC: 4.64 MIL/uL (ref 4.22–5.81)
RDW: 15.6 % — ABNORMAL HIGH (ref 11.5–15.5)
WBC: 9 K/uL (ref 4.0–10.5)
nRBC: 0 % (ref 0.0–0.2)

## 2024-08-22 LAB — RENAL FUNCTION PANEL
Albumin: 2.5 g/dL — ABNORMAL LOW (ref 3.5–5.0)
Anion gap: 11 (ref 5–15)
BUN: 24 mg/dL — ABNORMAL HIGH (ref 8–23)
CO2: 33 mmol/L — ABNORMAL HIGH (ref 22–32)
Calcium: 8.7 mg/dL — ABNORMAL LOW (ref 8.9–10.3)
Chloride: 95 mmol/L — ABNORMAL LOW (ref 98–111)
Creatinine, Ser: 1.47 mg/dL — ABNORMAL HIGH (ref 0.61–1.24)
GFR, Estimated: 50 mL/min — ABNORMAL LOW (ref >60)
Glucose, Bld: 169 mg/dL — ABNORMAL HIGH (ref 70–99)
Phosphorus: 3.5 mg/dL (ref 2.5–4.6)
Potassium: 3.5 mmol/L (ref 3.5–5.1)
Sodium: 139 mmol/L (ref 135–145)

## 2024-08-22 LAB — CULTURE, BODY FLUID W GRAM STAIN -BOTTLE: Culture: NO GROWTH

## 2024-08-22 LAB — MAGNESIUM: Magnesium: 2.1 mg/dL (ref 1.7–2.4)

## 2024-08-22 MED ORDER — FUROSEMIDE 20 MG PO TABS: 20.0000 mg | ORAL_TABLET | Freq: Every day | ORAL | 0 refills | Status: AC

## 2024-08-22 MED ORDER — ALBUTEROL SULFATE HFA 108 (90 BASE) MCG/ACT IN AERS: 2.0000 | INHALATION_SPRAY | RESPIRATORY_TRACT | 2 refills | Status: AC | PRN

## 2024-08-22 MED ORDER — SPACER/AERO-HOLDING CHAMBERS DEVI: 1.0000 | Freq: Four times a day (QID) | 0 refills | Status: AC | PRN

## 2024-08-22 MED ORDER — POTASSIUM CHLORIDE CRYS ER 20 MEQ PO TBCR: 40.0000 meq | EXTENDED_RELEASE_TABLET | Freq: Every day | ORAL | 0 refills | Status: AC

## 2024-08-22 MED ORDER — LEVOFLOXACIN 500 MG PO TABS: 500.0000 mg | ORAL_TABLET | Freq: Every day | ORAL | 0 refills | Status: AC

## 2024-08-22 NOTE — Progress Notes (Signed)
 SATURATION QUALIFICATIONS: (This note is used to comply with regulatory documentation for home oxygen )  Patient Saturations on Room Air at Rest = 88%%  Patient Saturations on Room Air while Ambulating = 85%%  Patient Saturations on 2 Liters of oxygen  while Ambulating = 94%

## 2024-08-22 NOTE — Plan of Care (Signed)

## 2024-08-22 NOTE — Progress Notes (Signed)
 RNCM received DME order for Oxygen .  Jermaine at [facility] contacted with order and confirmation received.  Oxygen  delivered to patient's BS.

## 2024-08-22 NOTE — Discharge Summary (Signed)
 Physician Discharge Summary  Mark Cannon FMW:[id number] DOB: [birth year] DOA: 08/15/2024  PCP: Leonce Lucie PARAS, PA-C  Admit date: 08/15/2024 Discharge date: 08/22/2024  Time spent: 45 minutes  Recommendations for Outpatient Follow-up:  Requires CBC Chem-12 in 1 week at PCP office and adjustment of electrolytes/potassium Recommend close follow-up with Dr. Darlean in the outpatient setting-CC him given need for probable repeat imaging of chest next month to month and a half as well as check on fluid status suggest referral for outpatient GI workup of cirrhosis including elastography  Discharge Diagnoses:  MAIN problem for hospitalization  Multifactorial respiratory failure pneumonia Decompensated heart failure Pleural effusions  Please see below for itemized issues addressed in HOpsital- refer to other progress notes for clarity if needed  Discharge Condition: Improved  Diet recommendation: Heart healthy  Filed Weights   08/20/24 0500 08/21/24 0616 08/22/24 0214  Weight: 106 kg 102.2 kg 103.1 kg    History of present illness:  73 year old white male quit smoking 2 years ago Chronic blood loss anemia with inpatient workup on admission 1/31 11/15/18/2024 colonoscopy showed 8 polyps EGD normal CKD 3 Prostate cancer status post prostatectomy 03/22/2015 Dr. Christopher Chronic respiratory failure on 3 to 4 L of oxygen -untreated sleep apnea Retinal branch occlusion on 2023 admission supposed to be on aspirin  Previous L4-L5 decompression [facility] Dr. Alix 2019   Chronology  11/2 came to emergency room at [facility] with shortness of breath productive cough over 1 week and had completed azithromycin  as an outpatient-CO2 34 creatinine 1.4 RVP testing negative CXR small right apical pneumothorax new from prior right lower lobe opacity and effusion-CT chest right sided hydropneumothorax with dependent consolidation-Rx ceftriaxone  doxycycline transferred to [name] for [facility]CM to see--chest tube  inserted 11/4 chest tube changed to waterseal 11/8 chest x-ray a.m. shows clearing of infiltrates  Assessment  & Plan :      Hydropneumothorax-right lower lobe opacity?  Pneumonia +/- heart failure Acute superimposed on HFpEF Chest tube culture NGTD 11/4, Gram stain no organisms only WBCs present-strep pneumo negative Legionella pending---- chest tube was removed successfully on 11/10 and he feels better overall and will get a follow-up appointment as above Diuresed aggressively -9 L weight down from 106 to 103 his dry weight should be considered restless He will be going home on Lasix  dose of 20 daily and should have this titrated in the outpatient setting Change Rocephin  to Levaquin complete on 11/11 Chronic respiratory failure at baseline on 2 to 4 L COPD-mild acute exacerbation on admission Steroids discontinued 11/5 Continuous Tussionex 5 every 12 Mucinex 1 tab twice daily, DuoNeb every 4 as needed Desats to 84% and has not been able to afford oxygen -we have been able to procure it for him he will go home on 3 L of oxygen  at discharge Diabetes mellitus with hyperglycemia Amaryl  1 mg admission is added back this admission and we resumed his Jardiance at discharge He was on sliding scale as well as long-acting insulin  during hospital stay because of hyperglycemia likely from his steroids He will need outpatient management and titration of meds AKI superimposed on CKD 3 AA Mild AKI iatrogenic from Lasix  Respiratory compensated alkalosis Mild hypokalemia-replaced with K-Dur 40-check a.m. [name] held on admission and resumed at discharge Stopped ARB 11/8--- giving 2 doses of Diamox 250 Electrolytes are improved to some degree-his iatrogenic AKI from Lasix  resolved and I expect it will resolve even more in the next several days and he needs to maintain slightly net negative balance  Previous prostate cancer with resection in 2016 Outpatient PSA monitoring as per his urologist---  nuclear body scan 03/02/2024 was negative Cirrhosis Outpatient workup per GI Previous GI bleed See above-continue Protonix  40 daily Pepcid  20 daily HTN Moderately controlled Continue bisoprolol  10 daily, Crestor  40 daily  Discharge Exam: Vitals:   08/22/24 0734 08/22/24 1115  BP: 97/78 (!) 128/46  Pulse: 61 63  Resp: 12 17  Temp: 97.9 F (36.6 C) 98.1 F (36.7 C)  SpO2: 96%     Subj on day of d/c   Awake coherent pleasant no distress he is looking comfortable on oxygen  He has no chest pain He has no fever   General Exam on discharge  EOMI NCAT faint crackles posterolaterally on the right side Abdomen soft no rebound S1-S2 no murmur ROM intact Trace lower extremity edema  Discharge Instructions   Discharge Instructions     Diet - low sodium heart healthy   Complete by: As directed    Discharge instructions   Complete by: As directed    Finish the Levaquin and make sure that you get the inhaler as well as the AeroChamber You will need to start taking Lasix  at a dose of 20 daily and you should also use your oxygen  at all times You should follow-up for 2-week visit in the office with Dr. Darlean of pulmonology resume all your other home meds without real change other than the valsartan  that we stopped and that can be started back in the outpatient setting It would be a good idea to try to continue to exercise and lose some weight   Increase activity slowly   Complete by: As directed       Allergies as of 08/22/2024       Reactions   Lidocaine  Hives   Benzocaine Other (See Comments)   Blisters    Other Other (See Comments)   Novocaine - blisters in mouth        Medication List     STOP taking these medications    ALPRAZolam 0.5 MG tablet Commonly known as: XANAX   azithromycin  250 MG tablet Commonly known as: ZITHROMAX    docusate sodium  100 MG capsule Commonly known as: COLACE   traMADol 50 MG tablet Commonly known as: ULTRAM   valsartan  80 MG  tablet Commonly known as: DIOVAN        TAKE these medications    albuterol  108 (90 Base) MCG/ACT inhaler Commonly known as: VENTOLIN  HFA Inhale 2 puffs into the lungs every 4 (four) hours as needed for wheezing or shortness of breath.   bisoprolol  10 MG tablet Commonly known as: ZEBETA  Take 10 mg by mouth daily.   famotidine  20 MG tablet Commonly known as: PEPCID  Take 20 mg by mouth daily.   FARXIGA PO Take by mouth.   ferrous sulfate  325 (65 FE) MG tablet Take 1 tablet (325 mg total) by mouth 3 (three) times daily with meals. What changed: when to take this   furosemide  20 MG tablet Commonly known as: LASIX  Take 1 tablet (20 mg total) by mouth daily. Start taking on: August 23, 2024 What changed:  medication strength how much to take when to take this reasons to take this   glimepiride  1 MG tablet Commonly known as: AMARYL  Take 1 mg by mouth 2 (two) times daily.   HYDROcodone -acetaminophen  10-325 MG tablet Commonly known as: NORCO Take 1 tablet by mouth 4 (four) times daily as needed for moderate pain (pain score 4-6). Takes 1/2 tablet  levofloxacin 500 MG tablet Commonly known as: LEVAQUIN Take 1 tablet (500 mg total) by mouth daily for 1 day. Start taking on: August 23, 2024   ondansetron  4 MG tablet Commonly known as: ZOFRAN  Take 4 mg by mouth every 8 (eight) hours as needed.   OXYGEN  Inhale 3 L into the lungs at bedtime.   pantoprazole  40 MG tablet Commonly known as: PROTONIX  Take 1 tablet (40 mg total) by mouth daily.   potassium chloride  SA 20 MEQ tablet Commonly known as: KLOR-CON  M Take 2 tablets (40 mEq total) by mouth daily. Start taking on: August 23, 2024   rosuvastatin  40 MG tablet Commonly known as: CRESTOR  TAKE ONE TABLET (40MG  TOTAL) BY MOUTH DAILY   Spacer/Aero-Holding Raguel French 1 each by Does not apply route 4 (four) times daily as needed.   Vitamin D  (Ergocalciferol ) 50000 units Caps Take 1 capsule by mouth once  a week.               Durable Medical Equipment  (From admission, onward)           Start     Ordered   08/21/24 1106  For home use only DME oxygen   Once       Comments: May go home---stopped using about a year ago due to $$---woud like to send him home but if he cannot afford the oxygen  we are a bit stuck...lmk by [employer]---ty  Question Answer Comment  Length of Need Lifetime   Mode or (Route) Nasal cannula   Liters per Minute 3   Frequency Continuous (stationary and portable oxygen  unit needed)   Oxygen  delivery system: Gas   Oxygen  delivery system: Portable concentrator (POC)      08/21/24 1106           Allergies  Allergen Reactions   Lidocaine  Hives   Benzocaine Other (See Comments)    Blisters    Other Other (See Comments)    Novocaine - blisters in mouth      The results of significant diagnostics from this hospitalization (including imaging, microbiology, ancillary and laboratory) are listed below for reference.    Significant Diagnostic Studies: DG CHEST PORT 1 VIEW Result Date: 08/21/2024 EXAM: 1 VIEW(S) XRAY OF THE CHEST 08/21/2024 03:37:00 PM COMPARISON: 08/21/2024 CLINICAL HISTORY: Pleural effusion FINDINGS: LINES, TUBES AND DEVICES: Right basilar pigtail chest tube in place. LUNGS AND PLEURA: Moderate right basilar atelectasis. No pneumothorax. HEART AND MEDIASTINUM: Aortic atherosclerosis. BONES AND SOFT TISSUES: Cervical fixation hardware noted. No acute osseous abnormality. IMPRESSION: 1. Moderate right basilar atelectasis. No pneumothorax. 2. Aortic atherosclerosis. Electronically signed by: Oneil Devonshire MD 08/21/2024 07:16 PM EST RP Workstation: [id number]   DG CHEST PORT 1 VIEW Result Date: 08/21/2024 EXAM: 1 VIEW(S) XRAY OF THE CHEST 08/21/2024 06:53:00 AM COMPARISON: 08/19/2024 CLINICAL HISTORY: Pleural effusion FINDINGS: LINES, TUBES AND DEVICES: Right chest tube in place. LUNGS AND PLEURA: No focal pulmonary opacity. No pulmonary edema. No  pleural effusion. No pneumothorax. HEART AND MEDIASTINUM: Atherosclerotic aortic calcifications. No acute abnormality of the cardiac and mediastinal silhouettes. BONES AND SOFT TISSUES: Cervical spinal fusion hardware noted. No acute osseous abnormality. IMPRESSION: 1. Right chest tube in place. 2. No pneumothorax identified. Electronically signed by: Rockey Kilts MD 08/21/2024 10:44 AM EST RP Workstation: [id number]   CT CHEST ABDOMEN PELVIS WO CONTRAST Result Date: 08/19/2024 CLINICAL DATA:  Pleural effusion. EXAM: CT CHEST, ABDOMEN AND PELVIS WITHOUT CONTRAST TECHNIQUE: Multidetector CT imaging of the chest, abdomen and pelvis was performed following the standard  protocol without IV contrast. RADIATION DOSE REDUCTION: This exam was performed according to the departmental dose-optimization program which includes automated exposure control, adjustment of the mA and/or kV according to patient size and/or use of iterative reconstruction technique. COMPARISON:  Chest CT dated 08/15/2024. FINDINGS: Evaluation of this exam is limited in the absence of intravenous contrast. CT CHEST FINDINGS Cardiovascular: There is no cardiomegaly or pericardial effusion. There is coronary vascular calcification. Mild atherosclerotic calcification of the thoracic aorta. No aneurysm or dilatation. The central pulmonary arteries are grossly unremarkable. Mediastinum/Nodes: No hilar or mediastinal adenopathy. The esophagus and the thyroid  gland are grossly unremarkable no mediastinal fluid collection. Lungs/Pleura: Trace right pleural effusion. There are bibasilar subpleural and subsegmental atelectasis. There is a small right apical pneumothorax (less than 20%). Right-sided chest tube with tip along the minor fissure. The central airways are patent. Musculoskeletal: No acute osseous pathology. CT ABDOMEN PELVIS FINDINGS Evaluation of this exam is limited in the absence of intravenous contrast. No intra-abdominal free air or free fluid.  Hepatobiliary: Fatty liver. No biliary dilatation. The gallbladder is unremarkable. Pancreas: Unremarkable. No pancreatic ductal dilatation or surrounding inflammatory changes. Spleen: Normal in size without focal abnormality. Adrenals/Urinary Tract: The adrenal glands unremarkable. Left renal inferior pole cyst. Vascular calcification versus punctate nonobstructing bilateral renal calculi. No hydronephrosis or obstructing stone. The visualized ureters and urinary bladder appear unremarkable. Stomach/Bowel: There is severe sigmoid diverticulosis. There is no bowel obstruction or active inflammation. Appendectomy. Vascular/Lymphatic: Mild aortoiliac atherosclerotic disease. The IVC is unremarkable. No portal venous gas. There is no adenopathy. Reproductive: Prostatectomy. Other: None Musculoskeletal: Lower lumbar fusion.  No acute osseous pathology. IMPRESSION: 1. Small right apical pneumothorax. Right-sided chest tube with tip along the minor fissure. 2. Trace right pleural effusion. 3. No acute intra-abdominal or pelvic pathology. 4. Fatty liver. 5. Severe sigmoid diverticulosis. No bowel obstruction. 6.  Aortic Atherosclerosis (ICD10-I70.0). Electronically Signed   By: Vanetta Chou M.D.   On: 08/19/2024 16:41   DG CHEST PORT 1 VIEW Result Date: 08/19/2024 EXAM: 1 VIEW XRAY OF THE CHEST 08/19/2024 06:30:40 AM COMPARISON: 08/18/2024 CLINICAL HISTORY: Pleural effusion FINDINGS: LINES, TUBES AND DEVICES: Right pigtail pleural drainage catheter in place, stable in position. LUNGS AND PLEURA: No focal pulmonary opacity. No pulmonary edema. No pleural effusion. Trace right apical pneumothorax. HEART AND MEDIASTINUM: No acute abnormality of the cardiac and mediastinal silhouettes. BONES AND SOFT TISSUES: Cervical spinal fusion hardware in place. No acute osseous abnormality. IMPRESSION: 1. Unchanged appearance of right-sided pigtail thoracostomy tube with trace right apical pneumothorax. Electronically signed by:  Waddell Calk MD 08/19/2024 07:26 AM EST RP Workstation: [id number]   DG CHEST PORT 1 VIEW Result Date: 08/18/2024 CLINICAL DATA:  [id number] Pneumothorax [id number] EXAM: PORTABLE CHEST - 1 VIEW COMPARISON:  08/18/2024 5:58 a.m. FINDINGS: Lower lung volumes with streaky bibasilar atelectasis. No new airspace consolidation or pleural effusion. Pigtail thoracostomy tube overlying the right mid lung, similarly positioned. No pneumothorax. Mild cardiomegaly. Cervical fusion hardware. Multilevel thoracic osteophytosis. IMPRESSION: 1. Similarly positioned small bore right-sided pigtail thoracostomy tube. No pneumothorax. 2. Bibasilar atelectasis Electronically Signed   By: Rogelia Myers M.D.   On: 08/18/2024 12:15   DG CHEST PORT 1 VIEW Result Date: 08/18/2024 EXAM: 1 VIEW(S) XRAY OF THE CHEST 08/18/2024 06:02:34 AM COMPARISON: Comparison yesterday, 08/17/2024, was noted with minimal right apical pneumothorax. CLINICAL HISTORY: Pleural effusion. FINDINGS: LUNGS AND PLEURA: Left basilar atelectasis is noted. No pulmonary edema. No pleural effusion. No pneumothorax. HEART AND MEDIASTINUM: No acute abnormality of the cardiac and  mediastinal silhouettes. BONES AND SOFT TISSUES: No acute osseous abnormality. IMPRESSION: 1. Minimal right apical pneumothorax, stable compared to yesterday. 2. Left basilar atelectasis. Electronically signed by: Lynwood Seip MD 08/18/2024 08:23 AM EST RP Workstation: [id number]   DG CHEST PORT 1 VIEW Result Date: 08/17/2024 CLINICAL DATA:  Follow-up right apical trace to small pneumothorax. EXAM: PORTABLE CHEST 1 VIEW COMPARISON:  08/16/2024 FINDINGS: Stable right pigtail pleural catheter. The previously demonstrated trace to small pneumothorax is smaller with a trace residual pneumothorax. Stable mildly enlarged cardiac silhouette and clear lungs with normal vascularity. Stable moderate to marked bilateral glenohumeral degenerative changes, thoracic spine degenerative changes and cervical spine  fixation hardware. IMPRESSION: 1. Improved right apical pneumothorax with trace residual pneumothorax. 2. Stable mild cardiomegaly. Electronically Signed   By: Elspeth Bathe M.D.   On: 08/17/2024 16:48   DG CHEST PORT 1 VIEW Result Date: 08/17/2024 CLINICAL DATA:  Shortness of breath.  Evaluate pneumothorax. EXAM: PORTABLE CHEST 1 VIEW COMPARISON:  08/15/2024, 08/16/2024 and 08/17/2024. FINDINGS: Right-sided chest tube unchanged. Tiny persistent right apical pneumothorax without significant change from 08/17/2024 although slightly improved compared to 08/15/2024. Lungs are somewhat hypoinflated demonstrate minimal bibasilar opacification likely atelectasis. Cardiomediastinal silhouette and remainder of the exam is unchanged. IMPRESSION: 1. Tiny persistent right apical pneumothorax unchanged from 08/17/2024, although slightly improved compared to 08/15/2024. Right-sided chest tube unchanged. 2. Minimal bibasilar atelectasis. Electronically Signed   By: Toribio Agreste M.D.   On: 08/17/2024 14:23   DG CHEST PORT 1 VIEW Result Date: 08/16/2024 EXAM: 1 VIEW(S) XRAY OF THE CHEST 08/16/2024 05:32:00 PM COMPARISON: Chest x-ray 08/15/2024 04:05 PM. CLINICAL HISTORY: [id number] Pneumothorax 357714 Pneumothorax. FINDINGS: LINES, TUBES AND DEVICES: Right chest tube with pigtail in stable position. LUNGS AND PLEURA: Persistent stable right trace to small apical pneumothorax. No left pneumothorax. No pleural effusion. No focal pulmonary opacity. No pulmonary edema. HEART AND MEDIASTINUM: No acute abnormality of the cardiac and mediastinal silhouettes. BONES AND SOFT TISSUES: No acute osseous abnormality. IMPRESSION: 1. Persistent stable right trace to small apical pneumothorax. 2. Right chest tube with pigtail in stable position. Electronically signed by: Morgane Naveau MD 08/16/2024 06:20 PM EST RP Workstation: [id number]   ECHOCARDIOGRAM COMPLETE Result Date: 08/16/2024    ECHOCARDIOGRAM REPORT   Patient Name:   JACCOB CZAPLICKI Date of Exam: 08/16/2024 Medical Rec #:  [id number]      Height:       67.5 in Accession #:    [id number]     Weight:       235.0 lb Date of Birth:  [birth year]      BSA:          2.178 m Patient Age:    73 years       BP:           157/67 mmHg Patient Gender: M              HR:           74 bpm. Exam Location:  Inpatient Procedure: 2D Echo, Cardiac Doppler and Color Doppler (Both Spectral and Color            Flow Doppler were utilized during procedure). Indications:    CHF I50.31  History:        Patient has prior history of Echocardiogram examinations, most                 recent 05/15/2022. CHF and HFpEF, COPD and Stroke,  Signs/Symptoms:Dizziness/Lightheadedness and Dyspnea; Risk                 Factors:Hypertension, Sleep Apnea, Diabetes, Chronic Kidney                 disease, Former Smoker and Dyslipidemia.  Sonographer:    BERNARDA ROCKS Referring Phys: [id number] OLADAPO ADEFESO  Sonographer Comments: Technically difficult due to patient being unable to roll onto his left side due to chest tube. IMPRESSIONS  1. Left ventricular ejection fraction, by estimation, is 60 to 65%. The left ventricle has normal function. The left ventricle has no regional wall motion abnormalities. Left ventricular diastolic parameters were normal.  2. Right ventricular systolic function is normal. The right ventricular size is normal.  3. The mitral valve is normal in structure. No evidence of mitral valve regurgitation. No evidence of mitral stenosis.  4. The aortic valve is normal in structure. There is mild calcification of the aortic valve. Aortic valve regurgitation is not visualized. No aortic stenosis is present.  5. The inferior vena cava is normal in size with <50% respiratory variability, suggesting right atrial pressure of 8 mmHg. Comparison(s): No significant difference when compared to prior echo. FINDINGS  Left Ventricle: Left ventricular ejection fraction, by estimation, is 60 to 65%. The left  ventricle has normal function. The left ventricle has no regional wall motion abnormalities. The left ventricular internal cavity size was normal in size. There is  no left ventricular hypertrophy. Left ventricular diastolic parameters were normal. Right Ventricle: The right ventricular size is normal. No increase in right ventricular wall thickness. Right ventricular systolic function is normal. Left Atrium: Left atrial size was normal in size. Right Atrium: Right atrial size was normal in size. Pericardium: There is no evidence of pericardial effusion. Mitral Valve: The mitral valve is normal in structure. No evidence of mitral valve regurgitation. No evidence of mitral valve stenosis. MV peak gradient, 7.2 mmHg. The mean mitral valve gradient is 3.0 mmHg. Tricuspid Valve: The tricuspid valve is normal in structure. Tricuspid valve regurgitation is not demonstrated. No evidence of tricuspid stenosis. Aortic Valve: The aortic valve is normal in structure. There is mild calcification of the aortic valve. Aortic valve regurgitation is not visualized. No aortic stenosis is present. Aortic valve mean gradient measures 4.0 mmHg. Aortic valve peak gradient measures 9.7 mmHg. Aortic valve area, by VTI measures 2.43 cm. Pulmonic Valve: The pulmonic valve was not well visualized. Pulmonic valve regurgitation is not visualized. No evidence of pulmonic stenosis. Aorta: The aortic root is normal in size and structure. Venous: The inferior vena cava is normal in size with less than 50% respiratory variability, suggesting right atrial pressure of 8 mmHg. IAS/Shunts: No atrial level shunt detected by color flow Doppler.  LEFT VENTRICLE PLAX 2D LVIDd:         4.80 cm      Diastology LVIDs:         3.10 cm      LV e' medial:    6.85 cm/s LV PW:         1.00 cm      LV E/e' medial:  18.7 LV IVS:        1.20 cm      LV e' lateral:   10.60 cm/s LVOT diam:     1.80 cm      LV E/e' lateral: 12.1 LV SV:         70 LV SV Index:   32 LVOT  Area:  2.54 cm  LV Volumes (MOD) LV vol d, MOD A2C: 141.0 ml LV vol s, MOD A2C: 46.1 ml LV SV MOD A2C:     94.9 ml RIGHT VENTRICLE             IVC RV Basal diam:  4.10 cm     IVC diam: 2.00 cm RV S prime:     17.00 cm/s TAPSE (M-mode): 2.5 cm LEFT ATRIUM             Index        RIGHT ATRIUM           Index LA diam:        3.10 cm 1.42 cm/m   RA Area:     20.00 cm LA Vol (A2C):   57.1 ml 26.22 ml/m  RA Volume:   55.40 ml  25.44 ml/m LA Vol (A4C):   41.2 ml 18.92 ml/m LA Biplane Vol: 48.4 ml 22.22 ml/m  AORTIC VALVE                    PULMONIC VALVE AV Area (Vmax):    2.20 cm     PV Vmax:          1.05 m/s AV Area (Vmean):   2.29 cm     PV Peak grad:     4.4 mmHg AV Area (VTI):     2.43 cm     PR End Diast Vel: 1.76 msec AV Vmax:           156.00 cm/s AV Vmean:          90.500 cm/s AV VTI:            0.290 m AV Peak Grad:      9.7 mmHg AV Mean Grad:      4.0 mmHg LVOT Vmax:         135.00 cm/s LVOT Vmean:        81.300 cm/s LVOT VTI:          0.277 m LVOT/AV VTI ratio: 0.96  AORTA Ao Root diam: 3.20 cm Ao Asc diam:  3.30 cm MITRAL VALVE MV Area (PHT): 2.88 cm     SHUNTS MV Area VTI:   1.91 cm     Systemic VTI:  0.28 m MV Peak grad:  7.2 mmHg     Systemic Diam: 1.80 cm MV Mean grad:  3.0 mmHg MV Vmax:       1.34 m/s MV Vmean:      83.8 cm/s MV Decel Time: 263 msec MV E velocity: 128.00 cm/s MV A velocity: 102.00 cm/s MV E/A ratio:  1.25 Franck Azobou Tonleu Electronically signed by Joelle Ren Ny Signature Date/Time: 08/16/2024/4:19:05 PM    Final    DG Chest Port 1 View Result Date: 08/15/2024 EXAM: 1 VIEW XRAY OF THE CHEST 08/15/2024 08:36:19 PM COMPARISON: 08/15/2024 CLINICAL HISTORY: post chest tube placement FINDINGS: LINES, TUBES AND DEVICES: Interval placement of right chest tube. LUNGS AND PLEURA: No focal pulmonary opacity. No pulmonary edema. Decreasing right pleural effusion. No pneumothorax. HEART AND MEDIASTINUM: No acute abnormality of the cardiac and mediastinal silhouettes. BONES  AND SOFT TISSUES: No acute osseous abnormality. IMPRESSION: 1. Interval placement of right chest tube. 2. No pneumothorax. 3. Decreasing right pleural effusion. Electronically signed by: Franky Crease MD 08/15/2024 08:42 PM EST RP Workstation: [id number]   CT Chest W Contrast Result Date: 08/15/2024 CLINICAL DATA:  Short of breath for 2 days, hypoxia, pneumothorax on earlier  chest x-ray, history of prostate cancer EXAM: CT CHEST WITH CONTRAST TECHNIQUE: Multidetector CT imaging of the chest was performed during intravenous contrast administration. RADIATION DOSE REDUCTION: This exam was performed according to the departmental dose-optimization program which includes automated exposure control, adjustment of the mA and/or kV according to patient size and/or use of iterative reconstruction technique. CONTRAST:  75mL OMNIPAQUE  IOHEXOL  300 MG/ML  SOLN COMPARISON:  11/19/2023, 08/15/2024 FINDINGS: Cardiovascular: The heart is unremarkable without pericardial effusion. No evidence of thoracic aortic aneurysm or dissection. Atherosclerosis of the aorta and coronary vasculature. Mediastinum/Nodes: No enlarged mediastinal, hilar, or axillary lymph nodes. Thyroid  gland, trachea, and esophagus demonstrate no significant findings. Lungs/Pleura: Right-sided hydropneumothorax is identified. Fluid component measures less than 1 L. anterior pneumothorax component volume estimated less than 10-15%. No tension effect or midline shift. There is deep and consolidation within the right lung, most pronounced in the right lower lobe, with associated volume loss. This favors atelectasis over airspace disease. Follow-up to resolution is recommended to exclude underlying mass. The left chest is clear.  Central airways are patent. Upper Abdomen: Cirrhotic morphology of the liver, with subtle nodularity of the liver capsule and hypertrophy of the left lobe. No acute upper abdominal findings. Musculoskeletal: No acute or destructive bony  abnormalities. Reconstructed images demonstrate no additional findings. IMPRESSION: 1. Right-sided hydropneumothorax as above. Fluid component measures less than 1 L. Anterior pneumothorax component measures less than 10-15%. No tension effect or midline shift. 2. Dependent consolidation within the right lung, most pronounced within the right lower lobe, with associated volume loss. Favor atelectasis over pneumonia. Follow-up to resolution recommended to ensure resolution and exclude underlying mass lesion. 3. Aortic Atherosclerosis (ICD10-I70.0). Coronary artery atherosclerosis. 4. Hepatic cirrhosis. Electronically Signed   By: Ozell Daring M.D.   On: 08/15/2024 17:37   DG Chest Port 1 View Result Date: 08/15/2024 EXAM: 1 VIEW XRAY OF THE CHEST 08/15/2024 04:10:22 PM COMPARISON: Comparison to 11/08/2022. CLINICAL HISTORY: dyspnea dyspnea FINDINGS: LUNGS AND PLEURA: Interval development of small right apical pneumothorax. Right lower lobe opacity is noted concerning for pneumonia or atelectasis with associated effusion. CT scan is recommended to rule out possible underlying mass. No pulmonary edema. HEART AND MEDIASTINUM: Stable cardiomegaly. BONES AND SOFT TISSUES: No acute osseous abnormality. IMPRESSION: 1. Small right apical pneumothorax, new from prior exam. 2. Right lower lobe opacity with associated effusion, suspicious for pneumonia or atelectasis. Recommend chest CT to exclude an underlying mass. 3. Stable cardiomegaly. Electronically signed by: Lynwood Seip MD 08/15/2024 04:37 PM EST RP Workstation: [id number]    Microbiology: Recent Results (from the past 240 hours)  Resp panel by RT-PCR (RSV, Flu A&B, Covid) Anterior Nasal Swab     Status: None   Collection Time: 08/15/24  3:41 PM   Specimen: Anterior Nasal Swab  Result Value Ref Range Status   SARS Coronavirus 2 by RT PCR NEGATIVE NEGATIVE Final    Comment: (NOTE) SARS-CoV-2 target nucleic acids are NOT DETECTED.  The SARS-CoV-2 RNA is  generally detectable in upper respiratory specimens during the acute phase of infection. The lowest concentration of SARS-CoV-2 viral copies this assay can detect is 138 copies/mL. A negative result does not preclude SARS-Cov-2 infection and should not be used as the sole basis for treatment or other patient management decisions. A negative result may occur with  improper specimen collection/handling, submission of specimen other than nasopharyngeal swab, presence of viral mutation(s) within the areas targeted by this assay, and inadequate number of viral copies(<138 copies/mL). A negative result must  be combined with clinical observations, patient history, and epidemiological information. The expected result is Negative.  Fact Sheet for Patients:  bloggercourse.com  Fact Sheet for Healthcare Providers:  seriousbroker.it  This test is no t yet approved or cleared by the [place]  FDA and  has been authorized for detection and/or diagnosis of SARS-CoV-2 by FDA under an Emergency Use Authorization (EUA). This EUA will remain  in effect (meaning this test can be used) for the duration of the COVID-19 declaration under Section 564(b)(1) of the Act, 21 U.S.C.section 360bbb-3(b)(1), unless the authorization is terminated  or revoked sooner.       Influenza A by PCR NEGATIVE NEGATIVE Final   Influenza B by PCR NEGATIVE NEGATIVE Final    Comment: (NOTE) The Xpert Xpress SARS-CoV-2/FLU/RSV plus assay is intended as an aid in the diagnosis of influenza from Nasopharyngeal swab specimens and should not be used as a sole basis for treatment. Nasal washings and aspirates are unacceptable for Xpert Xpress SARS-CoV-2/FLU/RSV testing.  Fact Sheet for Patients: bloggercourse.com  Fact Sheet for Healthcare Providers: seriousbroker.it  This test is not yet approved or cleared by the [place]  [place] FDA and has been authorized for detection and/or diagnosis of SARS-CoV-2 by FDA under an Emergency Use Authorization (EUA). This EUA will remain in effect (meaning this test can be used) for the duration of the COVID-19 declaration under Section 564(b)(1) of the Act, 21 U.S.C. section 360bbb-3(b)(1), unless the authorization is terminated or revoked.     Resp Syncytial Virus by PCR NEGATIVE NEGATIVE Final    Comment: (NOTE) Fact Sheet for Patients: bloggercourse.com  Fact Sheet for Healthcare Providers: seriousbroker.it  This test is not yet approved or cleared by the [place]  FDA and has been authorized for detection and/or diagnosis of SARS-CoV-2 by FDA under an Emergency Use Authorization (EUA). This EUA will remain in effect (meaning this test can be used) for the duration of the COVID-19 declaration under Section 564(b)(1) of the Act, 21 U.S.C. section 360bbb-3(b)(1), unless the authorization is terminated or revoked.  Performed at [facility] [address] [place], [place] [address]   Fungus Culture With Stain     Status: None (Preliminary result)   Collection Time: 08/17/24  3:13 PM   Specimen: Pleural Fluid  Result Value Ref Range Status   Fungus Stain Final report  Final    Comment: (NOTE) Performed At: [facility] [address] [place], [place] [id number] Jennette Shorter MD Ey:[id number]    Fungus (Mycology) Culture PENDING  Incomplete   Fungal Source FLUID  Final    Comment: PLEURAL Performed at [facility], [address], [place], [place] [address]   Culture, body fluid w Gram Stain-bottle     Status: None   Collection Time: 08/17/24  3:13 PM   Specimen: Fluid  Result Value Ref Range Status   Specimen Description FLUID PLEURAL  Final   Special Requests BOTTLES DRAWN AEROBIC AND ANAEROBIC  Final   Culture   Final    NO GROWTH 5 DAYS Performed at [facility], [address], [place], [place] [address]    Report Status 08/22/2024 FINAL  Final  Gram stain     Status: None   Collection Time: 08/17/24  3:13 PM   Specimen: Fluid  Result Value Ref Range Status   Specimen Description FLUID PLEURAL  Final   Special Requests NONE  Final   Gram Stain   Final    WBC PRESENT,BOTH PMN AND  MONONUCLEAR NO ORGANISMS SEEN CYTOSPIN SMEAR Performed at [facility], [address], [place], [place] [address]    Report Status 08/17/2024 FINAL  Final  Fungus Culture Result     Status: None   Collection Time: 08/17/24  3:13 PM  Result Value Ref Range Status   Result 1 Comment  Final    Comment: (NOTE) KOH/Calcofluor preparation:  no fungus observed. Performed At: [facility] [address] [place], [place] [id number] Jennette Shorter MD Ey:[id number]      Labs: Basic Metabolic Panel: Recent Labs  Lab 08/17/24 0300 08/18/24 0418 08/18/24 2240 08/19/24 0325 08/20/24 0249 08/21/24 0331 08/22/24 0248  NA 137 140 136 138 137 141 139  K 4.9 4.3 4.7 4.4 3.6 3.3* 3.5  CL 99 98 96* 99 96* 92* 95*  CO2 27 32 32 28 29 37* 33*  GLUCOSE 350* 160* 410* 260* 176* 135* 169*  BUN 26* 27* 32* 31* 25* 23 24*  CREATININE 1.63* 1.43* 1.50* 1.36* 1.20 1.54* 1.47*  CALCIUM  8.7* 8.8* 8.6* 8.7* 8.4* 8.7* 8.7*  MG 2.4 2.4  --   --   --   --  2.1  PHOS 3.7 3.9  --   --   --   --  3.5   Liver Function Tests: Recent Labs  Lab 08/15/24 1541 08/16/24 0631 08/17/24 0300 08/18/24 0418 08/22/24 0248  AST 15  --  12* 10*  --   ALT 10  --  12 11  --   ALKPHOS 65  --  48 43  --   BILITOT 0.5  --  0.7 0.7  --   PROT 7.2 6.4* 6.6 6.5  --   ALBUMIN  3.9  --  2.8* 2.8* 2.5*   No results for input(s): LIPASE, AMYLASE in the last 168 hours. No results for input(s): AMMONIA in the last 168 hours. CBC: Recent Labs  Lab 08/18/24 0418 08/19/24 0325 08/20/24 0249 08/21/24 0331 08/22/24 0248  WBC 13.7* 10.2 9.2 9.3 9.0  NEUTROABS 11.8* 8.7* 6.8 6.8 6.5  HGB  13.9 13.6 13.9 14.4 13.5  HCT 45.1 43.9 44.0 45.2 42.3  MCV 93.2 92.6 91.1 90.8 91.2  PLT 221 192 192 191 178   Cardiac Enzymes: No results for input(s): CKTOTAL, CKMB, CKMBINDEX, TROPONINI in the last 168 hours. BNP: BNP (last 3 results) Recent Labs    08/19/24 0325  BNP 133.4*    ProBNP (last 3 results) Recent Labs    08/15/24 1541  PROBNP 1,492.0*    CBG: Recent Labs  Lab 08/21/24 1511 08/21/24 1800 08/21/24 2107 08/22/24 0604 08/22/24 1159  GLUCAP 185* 171* 186* 179* 199*    Signed:  Colen Grimes MD   [facility] 08/22/2024, 1:58 PM

## 2024-08-22 NOTE — Plan of Care (Signed)
  Problem: Safety: Goal: Ability to remain free from injury will improve Outcome: Progressing   Problem: Metabolic: Goal: Ability to maintain appropriate glucose levels will improve Outcome: Progressing   Problem: Coping: Goal: Ability to adjust to condition or change in health will improve Outcome: Progressing

## 2024-08-22 NOTE — Progress Notes (Signed)
 Discharge   Patient expressed verbal understanding of discharge POC.   Patient given time to ask any questions.  Additional education included in AVS.  Alert oriented in good spirits.   Tele and PIV removed by RN.  Pressure dressings intact.  CCMD/ [place].  Oxygen  tank at bedside.   Patient is incontinent and waiting for son with clothes.  Discharge lounge aware.   Call bell within reach.  Primary RN updated.

## 2024-08-22 NOTE — Progress Notes (Signed)
 RNCM received HH orders for PT and RN.  RNCM spoke with patient and he has no preference for services so [name] at [facility] contacted with orders and confirmation received.  Patient in agreement to services.

## 2024-08-24 ENCOUNTER — Ambulatory Visit (INDEPENDENT_AMBULATORY_CARE_PROVIDER_SITE_OTHER): Admitting: Internal Medicine

## 2024-08-24 ENCOUNTER — Ambulatory Visit (HOSPITAL_COMMUNITY)
Admission: RE | Admit: 2024-08-24 | Discharge: 2024-08-24 | Disposition: A | Discharge status: Home / Self Care | Source: Ambulatory Visit | Attending: Internal Medicine | Admitting: Internal Medicine

## 2024-08-24 ENCOUNTER — Encounter: Payer: Self-pay | Admitting: Internal Medicine

## 2024-08-24 VITALS — BP 111/67 | HR 84 | Ht 67.5 in | Wt 235.0 lb

## 2024-08-24 DIAGNOSIS — R0609 Other forms of dyspnea: Secondary | ICD-10-CM | POA: Insufficient documentation

## 2024-08-24 DIAGNOSIS — R0902 Hypoxemia: Secondary | ICD-10-CM

## 2024-08-24 DIAGNOSIS — J449 Chronic obstructive pulmonary disease, unspecified: Secondary | ICD-10-CM | POA: Diagnosis not present

## 2024-08-24 DIAGNOSIS — N1831 Chronic kidney disease, stage 3a: Secondary | ICD-10-CM

## 2024-08-24 DIAGNOSIS — Z87891 Personal history of nicotine dependence: Secondary | ICD-10-CM | POA: Diagnosis not present

## 2024-08-24 DIAGNOSIS — G4734 Idiopathic sleep related nonobstructive alveolar hypoventilation: Secondary | ICD-10-CM

## 2024-08-24 DIAGNOSIS — J189 Pneumonia, unspecified organism: Secondary | ICD-10-CM | POA: Diagnosis not present

## 2024-08-24 DIAGNOSIS — Z4682 Encounter for fitting and adjustment of non-vascular catheter: Secondary | ICD-10-CM | POA: Diagnosis not present

## 2024-08-24 LAB — MISC LABCORP TEST (SEND OUT): Labcorp test code: 9985

## 2024-08-24 NOTE — Assessment & Plan Note (Addendum)
 D/c on 02  on 11/15/22 - on 02 3lpm hs with no ex desats  on RA as of  12/09/22  - ONO on 3lpm 02/20/23   < 4 min less than 89%  so no change 3lpm - - 11/24/2023 d/c'd all 02 at pt's request  - post CAP/ hyrdroptx admit 08/15/24  d/c on 3lpm  hs and prn daytime  -08/24/2024  RA sats 87% p arrival on RA   >>>   Rec continue 02 at 3lpm hs , not needed at rest  eg sitting/ and  titrate 02 daytime to keep sats > 90% with activity

## 2024-08-24 NOTE — Assessment & Plan Note (Addendum)
 Quit smoking Sept 14 2023  -  Onset of symptoms  2023  -  Echo 05/15/22 grad 1 diastolic dysfunction  - B R >> L pleural effusions:  R Tap 11/13/22  900 cc transudate in setting of severe anemia attributed to polyps > removed at admit  - 12/09/2022   Walked on ra   approx 75  ft  @ slow  pace, stopped due to feeling fainty with lowest 02 sats 96%  - PFT's  01/14/23  FEV1 1.34 (45 % ) ratio 0.54  p 0 % improvement from saba p 0 prior to study with DLCO  13.32 (55%)   and FV curve classically concave  and ERV 46%   at wt 230   - 01/21/2023  After extensive coaching inhaler device,  effectiveness =    75% (short ti with dpi > try telegy 200 sample then anoro 1 click each am and f/u prn   - 06/27/2023 d/c'd all inhalers they don't work with harsh cough on lisinopril  and prominent pseudowheeze on exam > resolved off ACEi  -  11/19/23 LDSCT Mild diffuse bronchial wall thickening with mild centrilobular and paraseptal emphysema  Surprisingly well compensated s any inhalers or nebs and not even using 02 as rec  typical of a GOLD group A pt > prn saba approp   Re SABA :  I spent extra time with pt today reviewing appropriate use of albuterol  for prn use on exertion with the following points: 1) saba is for relief of sob that does not improve by walking a slower pace or resting but rather if the pt does not improve after trying this first. 2) If the pt is convinced, as many are, that saba helps recover from activity faster then it's easy to tell if this is the case by re-challenging : ie stop, take the inhaler, then p 5 minutes try the exact same activity (intensity of workload) that just caused the symptoms and see if they are substantially diminished or not after saba 3) if there is an activity that reproducibly causes the symptoms, try the saba 15 min before the activity on alternate days   If in fact the saba really does help, then fine to continue to use it prn but advised may need to look closer at the  maintenance regimen (for now = 0)  being used to achieve better control of airways disease with exertion.

## 2024-08-24 NOTE — Patient Instructions (Addendum)
 Make sure you check your oxygen  saturation  AT  your highest level of activity (not after you stop)   to be sure it stays over 90% and adjust  02 flow upward to maintain this level if needed but remember to turn it back to previous settings when you stop (to conserve your supply).   Please remember to go to the lab department   for your tests - we will call you with the results when they are available.      Please remember to go to the  x-ray department  @  [facility] for your tests - we will call you with the results when they are available      Please schedule a follow up visit in 6 months but call sooner if needed

## 2024-08-24 NOTE — Assessment & Plan Note (Addendum)
 See admit 08/15/24 complicated by R parapneumonic effusion > R chest tube short term only  - 08/24/2024 f/u cxr marked improvement/ no significant residual effusion

## 2024-08-24 NOTE — Progress Notes (Signed)
 Mark Cannon, male    DOB: Jul 07, 1951    MRN: [id number]   Brief patient profile:  73  yowm quit smoking Sept 14 2023  / wife is retired [employer]  referred to pulmonary clinic in [employer]  08/08/2022 by [facility]  for f/u pna and 02 dependence. Had previously eval by [facility] with clinical dx of COPD > pt says he never needed for resp meds though very sedentary due to back pain and ultimately proved to have GOLD 3  COPD     History of Present Illness  08/08/2022  Pulmonary/ 1st office eval/ Mark Cannon / [facility]  Chief Complaint  Patient presents with   Consult    Copd exacerbation and pneumonia of LL lobe  Uses 3LO2 cont 24/7   Dyspnea:  back to baseline but 02 at 3 (was 4 lpm)  Cough: none  Sleep: flat  SABA use: has duoneb not using  02 3lpm  Presyncope since d/c  Chronic L > R leg swelling no change  Rec Go to ER:  Admit date:     08/08/2022  Discharge date: 08/09/22   Discharge Diagnoses: Principal Problem:   Symptomatic anemia   Acute respiratory failure with hypoxia (HCC)   Essential hypertension   GERD (gastroesophageal reflux disease)   Iron  deficiency anemia   Brief Hospital Course: Mark Cannon is a 73 y.o. male with medical history significant of arthritis, cancer, GERD, hyperlipidemia, hypertension presents to the ED with a chief complaint of difficulty breathing.  Patient reports that has been gradually in onset over the last 2 weeks.  The dyspnea is exertional.  His oxygen  sats dropped down to 75% while he is exerting himself.  Sitting down situating his oxygen  and trying to rest makes it better.  He reports the lowest oxygen  setting he is seen is 65% that was just prior to his previous hospitalization.  Patient reports no fevers, no cough.  He is wearing 4 L nasal cannula at baseline at home.  He reports he is not using his nebulizer because he ran out of medication for it.  He does not have any chest pains.  He does have some dizziness.  He has had no loss of  consciousness.  No change in vision.  He reports he does have a ringing in his ear sometimes.  Patient reports that he did quit smoking last month.  He has orthopnea at home that he reports is resolved but laying on his left or right side.  He reports no peripheral edema.  Patient reports a family history with his mother dying of low hemoglobin.  According to patient, mother had been anemic for the last couple years of her life and had to have recurrent transfusions and there was never any etiology identified.   Patient does not smoke, does not drink, does not use illicit drugs.  He is vaccinated for COVID.  Patient is full code.   Assessment and Plan: * Symptomatic anemia -Hemoglobin down to 7.6 - Latest hemoglobin about 1 month ago around 11 - Excellent response to 1 unit PRBC transfusion during hospitalization -At discharge hemoglobin 8.9  -No signs of overt bleeding appreciated, patient's symptoms completely improved/resolved. -Case discussed with gastroenterology who recommended outpatient work-up for anemia and endoscopic evaluation if needed.   -Continue PPI and avoid any NSAIDs outside the use of baby enteric-coated aspirin .   -IV iron  x1 also provided while inpatient as anemia panel demonstrated component of iron  deficiency.   Obesity, Class I, BMI  30-34.9 -Body mass index is 34.86 kg/m. -Low-calorie diet, portion control and increase physical activity discussed with patient.   Iron  deficiency anemia - Anemia panel demonstrating component of iron  deficiency anemia -Endoscopic evaluation GI work-up for anemia to be pursued as an outpatient -Continue PPI -Fecal occult blood test negative -Patient hemodynamically stable.   GERD (gastroesophageal reflux disease) -Continue Protonix  -Lifestyle changes discussed with patient.   Essential hypertension -Resume home antihypertensive regimen. -Heart healthy diet discussed with patient.   Acute respiratory failure with hypoxia  (HCC) -On oxygen  at home since last month -Stable saturation appreciated -Continue 4 L nasal cannula supplementation -Continue as needed bronchodilators -Very likely component of hypoxia unsure when the sensation driven by anemia. -Patient feeling ready to go home currently. -Outpatient follow-up with pulmonologist recommended. -No acute abnormalities appreciated on chest images.     Admit date:     73/31/2024  Discharge date: 11/15/22   Discharge Diagnoses: Principal Problem:   Acute on chronic respiratory failure with hypoxia (HCC)   Acute heart failure with preserved ejection fraction (HFpEF) (HCC)   Prostate cancer (HCC)   Lumbar stenosis with neurogenic claudication   Rectal bleeding   Stroke (cerebrum) (HCC)   COPD with acute exacerbation (HCC)   Essential hypertension   GERD (gastroesophageal reflux disease)   Iron  deficiency anemia   Class 2 obesity   Acute on chronic respiratory failure (HCC)     12/09/2022  post hosp f/u ov/Mark Cannon re: doe/newly on 02 since pna fall 2023   maint on no resp meds/ never  had pfts  Chief Complaint  Patient presents with   Follow-up    CT scan  Feeling less SOB    Dyspnea:  limited by lower back/  Cough: just  clearing throat / not noticeable while sleeping  Sleeping: bed is level/ one pillow  SABA use: has duoneb not using  02: wearing 4lpm 24/7  Covid status:   JJ  Rec My office will be contacting you by phone for referral to PFT  and overnight oximetry  Make sure you check your oxygen  saturation  AT  your highest level of activity (not after you stop)   to be sure it stays over 90%  Keep some candy handy :   - on 02 3lpm hs with no ex desats  on RA as of  12/10/71     01/21/2023  f/u ov/[place] office/Shailene Demonbreun re: GOLD 3 copd  maint on no rx x for noct 02   Chief Complaint  Patient presents with   Follow-up    Breathing has improved. He has occ dry cough, PND.   Dyspnea:  home bound mostly / 75 ft gives out freq with fainty  sensation > sob and not checking sats  Cough: none  Sleeping: level bed one pillow s resp cc  SABA use: none  02: 3lpm at hs   Rec Trelegy 200 one click - take two good drags 1st thing in am - if breathing better then ok on sample then ok to  try Anoro one click each am  - rinse mouth after use     - ONO on 3lpm 02/20/23   < 4 min less than 89%  so no change = 3lpm    06/27/2023  f/u ov/[place] office/Lillyana Majette re: GOLD 3/ noct hypoxemia  maint on no rx  (stopped 02 on his own and all inhalers (not working) on ACEi  Chief Complaint  Patient presents with   Follow-up  Dyspnea:  limited  by back  Cough: new since last ov harsh dry on lisinorpil  Sleeping: bed is level /one pillow s    resp cc  SABA use: none now  02: not using 02  Rec Stop lisinopril  (it is causing your wheezing in your throat) - also try to avoid clearing your throat (jolley ranchers) Start valsartan  80 mg one daily in place of lisinopril  and let your PCP know when you return  Only use your albuterol  as a rescue medication  Also  Ok to try albuterol  15 min before an activity (on alternating days)  that you know would usually make you short of breath Please schedule a follow up visit in 3 months but call sooner if needed    LDSCT 11/19/23 RADS 2  Mild diffuse bronchial wall thickening with mild centrilobular and paraseptal emphysema   11/24/2023  post hosp  f/u ov/[place] office/Markon Jares re: GOLD 3 copd /noct hypoxema/? Acei case maint on nothing including 02    Chief Complaint  Patient presents with   Shortness of Breath  Dyspnea:  low back limiting  Cough: none  Sleeping: level bed/ one pillow s  resp cc  SABA use: none  02: none  Lung cancer screening: 11/19/23 result still pending  Rec Ok to stop all oxygen     Admit date: 08/15/2024 Discharge date: 08/22/2024       Recommendations for Outpatient Follow-up:  Requires CBC Chem-12 in 1 week at PCP office and adjustment of electrolytes/potassium Recommend close  follow-up with Dr. Darlean in the outpatient setting-CC him given need for probable repeat imaging of chest next month to month and a half as well as check on fluid status suggest referral for outpatient GI workup of cirrhosis including elastography   Discharge Diagnoses:  MAIN problem for hospitalization Multifactorial respiratory failure pneumonia Decompensated heart failure Pleural effusions          Filed Weights    08/20/24 0500 08/21/24 0616 08/22/24 0214  Weight: 106 kg 102.2 kg 103.1 kg      History of present illness:  73 year old white male quit smoking 2 years ago Chronic blood loss anemia with inpatient workup on admission 1/31 11/15/18/2024 colonoscopy showed 8 polyps EGD normal CKD 3 Prostate cancer status post prostatectomy 03/22/2015 Dr. Christopher Chronic respiratory failure on 3 to 4 L of oxygen -untreated sleep apnea Retinal branch occlusion on 2023 admission supposed to be on aspirin  Previous L4-L5 decompression Plif [facility] Dr. Alix 2019   Chronology  11/2 came to emergency room at [facility] with shortness of breath productive cough over 1 week and had completed azithromycin  as an outpatient-CO2 34 creatinine 1.4 RVP testing negative CXR small right apical pneumothorax new from prior right lower lobe opacity and effusion-CT chest right sided hydropneumothorax with dependent consolidation-Rx ceftriaxone  doxycycline transferred to [name] for CCM to see--chest tube inserted 11/4 chest tube changed to waterseal 11/8 chest x-ray a.m. shows clearing of infiltrates  Assessment  & Plan :      Hydropneumothorax-right lower lobe opacity?  Pneumonia +/- heart failure Acute superimposed on HFpEF Chest tube culture NGTD 11/4, Gram stain no organisms only WBCs present-strep pneumo negative Legionella pending---- chest tube was removed successfully on 11/10 and he feels better overall and will get a follow-up appointment as above Diuresed aggressively -9 L weight down from 106 to 103  his dry weight should be considered restless He will be going home on Lasix  dose of 20 daily and should have this titrated in the outpatient setting Change  Rocephin  to Levaquin complete on 11/11 Chronic respiratory failure at baseline on 2 to 4 L COPD-mild acute exacerbation on admission Steroids discontinued 11/5 Continuous Tussionex 5 every 12 Mucinex 1 tab twice daily, DuoNeb every 4 as needed Desats to 84% and has not been able to afford oxygen -we have been able to procure it for him he will go home on 3 L of oxygen  at discharge Diabetes mellitus with hyperglycemia Amaryl  1 mg admission is added back this admission and we resumed his Jardiance at discharge He was on sliding scale as well as long-acting insulin  during hospital stay because of hyperglycemia likely from his steroids He will need outpatient management and titration of meds AKI superimposed on CKD 3 AA Mild AKI iatrogenic from Lasix  Respiratory compensated alkalosis Mild hypokalemia-replaced with K-Dur 40-check a.m. [name] held on admission and resumed at discharge Stopped ARB 11/8--- giving 2 doses of Diamox 250 Electrolytes are improved to some degree-his iatrogenic AKI from Lasix  resolved and I expect it will resolve even more in the next several days and he needs to maintain slightly net negative balance Previous prostate cancer with resection in 2016 Outpatient PSA monitoring as per his urologist--- nuclear body scan 03/02/2024 was negative Cirrhosis Outpatient workup per GI Previous GI bleed See above-continue Protonix  40 daily Pepcid  20 daily HTN Moderately controlled Continue bisoprolol  10 daily, Crestor  40 daily     08/24/2024  f/u ov/[place] office/Shevette Bess re: cute resp failure  maint on 02 hs and prn  - stopped all copd meds s change in doe but remains extremely sedentary  Chief Complaint  Patient presents with   Hospitalization Follow-up    Copd - had pna and needed to drain fluid off lungs     Dyspnea:  room to room at home  a two wheeled rolling walker  Cough: none  Sleeping: flat bed/ one pillow s    resp cc  SABA use: not using, don't help anyway  02: 3lpm HS concentrator     Lung cancer screening: q feb / in LCS program   No obvious day to day or daytime variability or assoc excess/ purulent sputum or mucus plugs or hemoptysis or cp or chest tightness, subjective wheeze or overt   hb symptoms.    Also denies any obvious fluctuation of symptoms with weather or environmental changes or other aggravating or alleviating factors except as outlined above   No unusual exposure hx or h/o childhood pna/ asthma or knowledge of premature birth.  Current Allergies, Complete Past Medical History, Past Surgical History, Family History, and Social History were reviewed in [employer] record.  ROS  The following are not active complaints unless bolded Hoarseness, sore throat, dysphagia, dental problems, itching, sneezing,  nasal congestion or discharge of excess mucus or purulent secretions, ear ache,   fever, chills, sweats, unintended wt loss or wt gain, classically pleuritic or exertional cp,  orthopnea pnd or arm/hand swelling  or leg swelling, presyncope, palpitations, abdominal pain, anorexia, nausea, vomiting, diarrhea  or change in bowel habits or change in bladder habits, change in stools or change in urine, dysuria, hematuria,  rash, arthralgias, visual complaints, headache, numbness, weakness or ataxia or problems with walking or coordination,  change in mood or  memory.        Current Meds  Medication Sig   albuterol  (VENTOLIN  HFA) 108 (90 Base) MCG/ACT inhaler Inhale 2 puffs into the lungs every 4 (four) hours as needed for wheezing or shortness of breath.  bisoprolol  (ZEBETA ) 10 MG tablet Take 10 mg by mouth daily.   famotidine  (PEPCID ) 20 MG tablet Take 20 mg by mouth daily.   ferrous sulfate  325 (65 FE) MG tablet Take 1 tablet (325 mg total) by mouth  3 (three) times daily with meals.   furosemide  (LASIX ) 20 MG tablet Take 1 tablet (20 mg total) by mouth daily.   glimepiride  (AMARYL ) 1 MG tablet Take 1 mg by mouth 2 (two) times daily.   HYDROcodone -acetaminophen  (NORCO) 10-325 MG tablet Take 1 tablet by mouth 4 (four) times daily as needed for moderate pain (pain score 4-6). Takes 1/2 tablet   ondansetron  (ZOFRAN ) 4 MG tablet Take 4 mg by mouth every 8 (eight) hours as needed.   OXYGEN  Inhale 3 L into the lungs at bedtime.   pantoprazole  (PROTONIX ) 40 MG tablet Take 1 tablet (40 mg total) by mouth daily.   potassium chloride  SA (KLOR-CON  M) 20 MEQ tablet Take 2 tablets (40 mEq total) by mouth daily.   rosuvastatin  (CRESTOR ) 40 MG tablet TAKE ONE TABLET (40MG  TOTAL) BY MOUTH DAILY   Spacer/Aero-Holding Raguel DEVI 1 each by Does not apply route 4 (four) times daily as needed.   Vitamin D , Ergocalciferol , 50000 units CAPS Take 1 capsule by mouth once a week.               Past Medical History:  Diagnosis Date   Arthritis    Cancer [facility])    Prostate, has had prostate removed, has had radiation and still has cancer,   Colon polyps    Cough    GERD (gastroesophageal reflux disease)    occasional    H/O tooth extraction week of 03/15/2015    History of kidney stones    Hypercholesteremia    Hypertension    Lumbar stenosis with neurogenic claudication    Pneumonia    Pre-diabetes    Renal disorder    Sleep apnea    No cpap       Objective:    Wts  08/24/2024     235   11/24/2023       240  06/27/2023       236  01/21/2023         227   12/09/22 228 lb (103.4 kg)  12/03/22 225 lb (102.1 kg)  11/29/22 218 lb 11.1 oz (99.2 kg)   Vital signs reviewed  08/24/2024  - Note at rest 02 sats  87% on RA    General appearance:    amb mod obese (by bmi) jovial wm nad/ does not appear to take questions seriously/ walking slow pace with cane   HEENT : Oropharynx  clear   Nasal turbinates nl    NECK :  without  apparent JVD/  palpable Nodes/TM    LUNGS: no acc muscle use,  Mild barrel  contour chest wall with well healed chest tube site.   Bilateral  Distant bs s audible wheeze and  without cough on insp or exp maneuvers  and mild  Hyperresonant  to  percussion bilaterally     CV:  RRR  no s3 or murmur or increase in P2, and no edema   ABD:  quite obese/ soft and nontender   MS:  Nl gait/ ext warm without deformities Or obvious joint restrictions  calf tenderness, cyanosis or clubbing     SKIN: warm and dry without lesions    NEURO:  alert, approp, nl sensorium with  no motor or cerebellar deficits  apparent.        CXR PA and Lateral:   08/24/2024 :    I personally reviewed images and impression is as follows:     Marked improvement in aeration esp R base , no effusion or ptx   Labs ordered/ reviewed:      Chemistry      Component Value Date/Time   NA 136 08/24/2024 1122   K 4.9 08/24/2024 1122   CL 98 08/24/2024 1122   CO2 26 08/24/2024 1122   BUN 28 (H) 08/24/2024 1122   CREATININE 1.55 (H) 08/24/2024 1122      Component Value Date/Time   CALCIUM  8.8 08/24/2024 1122   ALKPHOS 52 08/24/2024 1122   AST 14 08/24/2024 1122   ALT 13 08/24/2024 1122   BILITOT 0.4 08/24/2024 1122        Lab Results  Component Value Date   WBC 9.0 08/24/2024   HGB 13.7 08/24/2024   HCT 43.2 08/24/2024   MCV 92 08/24/2024   PLT 239 08/24/2024     Lab Results  Component Value Date   DDIMER 1.71 (H) 10/25/2022           Lab Results  Component Value Date   ESRSEDRATE 42 (H) 08/24/2024          Assessment   Assessment & Plan Nocturnal hypoxemia D/c on 02  on 11/15/22 - on 02 3lpm hs with no ex desats  on RA as of  12/09/22  - ONO on 3lpm 02/20/23   < 4 min less than 89%  so no change 3lpm - - 11/24/2023 d/c'd all 02 at pt's request  - post CAP/ hyrdroptx admit 08/15/24  d/c on 3lpm  hs and prn daytime  -08/24/2024  RA sats 87% p arrival on RA   >>>   Rec continue 02 at 3lpm hs , not needed at  rest  eg sitting/ and  titrate 02 daytime to keep sats > 90% with activity   COPD  GOLD 3  Quit smoking Sept 14 2023  -  Onset of symptoms  2023  -  Echo 05/15/22 grad 1 diastolic dysfunction  - B R >> L pleural effusions:  R Tap 11/13/22  900 cc transudate in setting of severe anemia attributed to polyps > removed at admit  - 12/09/2022   Walked on ra   approx 75  ft  @ slow  pace, stopped due to feeling fainty with lowest 02 sats 96%  - PFT's  01/14/23  FEV1 1.34 (45 % ) ratio 0.54  p 0 % improvement from saba p 0 prior to study with DLCO  13.32 (55%)   and FV curve classically concave  and ERV 46%   at wt 230   - 01/21/2023  After extensive coaching inhaler device,  effectiveness =    75% (short ti with dpi > try telegy 200 sample then anoro 1 click each am and f/u prn   - 06/27/2023 d/c'd all inhalers they don't work with harsh cough on lisinopril  and prominent pseudowheeze on exam > resolved off ACEi  -  11/19/23 LDSCT Mild diffuse bronchial wall thickening with mild centrilobular and paraseptal emphysema  Surprisingly well compensated s any inhalers or nebs and not even using 02 as rec  typical of a GOLD group A pt > prn saba approp   Re SABA :  I spent extra time with pt today reviewing appropriate use of albuterol  for prn use on exertion with the  following points: 1) saba is for relief of sob that does not improve by walking a slower pace or resting but rather if the pt does not improve after trying this first. 2) If the pt is convinced, as many are, that saba helps recover from activity faster then it's easy to tell if this is the case by re-challenging : ie stop, take the inhaler, then p 5 minutes try the exact same activity (intensity of workload) that just caused the symptoms and see if they are substantially diminished or not after saba 3) if there is an activity that reproducibly causes the symptoms, try the saba 15 min before the activity on alternate days   If in fact the saba really does  help, then fine to continue to use it prn but advised may need to look closer at the maintenance regimen (for now = 0)  being used to achieve better control of airways disease with exertion.    Community acquired pneumonia of right lung, unspecified part of lung See admit 08/15/24 complicated by R parapneumonic effusion > R chest tube short term only  - 08/24/2024 f/u cxr marked improvement/ no significant residual effusion    Chronic kidney disease, stage 3a (HCC) Lab Results  Component Value Date   CREATININE 1.55 (H) 08/24/2024   CREATININE 1.47 (H) 08/22/2024   CREATININE 1.54 (H) 08/21/2024     >>> no change baseline   He appears remarkably improved vs admit and just needs to more mindful of 02 needs as below  F/u can be q 6 m sooner prn          Each maintenance medication was reviewed in detail including emphasizing most importantly the difference between maintenance and prns and under what circumstances the prns are to be triggered using an action plan format where appropriate.  Total time for H and P, chart review, counseling, reviewing hfa/ 02 /pulse ox  device(s) and generating customized AVS unique to this office visit / same day charting = 42 min         AVS  Patient Instructions  Make sure you check your oxygen  saturation  AT  your highest level of activity (not after you stop)   to be sure it stays over 90% and adjust  02 flow upward to maintain this level if needed but remember to turn it back to previous settings when you stop (to conserve your supply).   Please remember to go to the lab department   for your tests - we will call you with the results when they are available.      Please remember to go to the  x-ray department  @  [facility] for your tests - we will call you with the results when they are available      Please schedule a follow up visit in 6 months but call sooner if needed      Ozell America, MD 08/24/2024

## 2024-08-26 ENCOUNTER — Ambulatory Visit: Payer: Self-pay | Admitting: Internal Medicine

## 2024-08-27 LAB — CBC WITH DIFFERENTIAL/PLATELET
Basophils Absolute: 0.1 x10E3/uL (ref 0.0–0.2)
Basos: 1 %
EOS (ABSOLUTE): 0.6 x10E3/uL — ABNORMAL HIGH (ref 0.0–0.4)
Eos: 6 %
Hematocrit: 43.2 % (ref 37.5–51.0)
Hemoglobin: 13.7 g/dL (ref 13.0–17.7)
Immature Grans (Abs): 0 x10E3/uL (ref 0.0–0.1)
Immature Granulocytes: 0 %
Lymphocytes Absolute: 1.3 x10E3/uL (ref 0.7–3.1)
Lymphs: 14 %
MCH: 29.3 pg (ref 26.6–33.0)
MCHC: 31.7 g/dL (ref 31.5–35.7)
MCV: 92 fL (ref 79–97)
Monocytes Absolute: 0.6 x10E3/uL (ref 0.1–0.9)
Monocytes: 7 %
Neutrophils Absolute: 6.5 x10E3/uL (ref 1.4–7.0)
Neutrophils: 72 %
Platelets: 239 x10E3/uL (ref 150–450)
RBC: 4.68 x10E6/uL (ref 4.14–5.80)
RDW: 14.4 % (ref 11.6–15.4)
WBC: 9 x10E3/uL (ref 3.4–10.8)

## 2024-08-27 LAB — SEDIMENTATION RATE: Sed Rate: 42 mm/h — ABNORMAL HIGH (ref 0–30)

## 2024-08-27 LAB — HEPATIC FUNCTION PANEL
ALT: 13 IU/L (ref 0–44)
AST: 14 IU/L (ref 0–40)
Albumin: 3.6 g/dL — ABNORMAL LOW (ref 3.8–4.8)
Alkaline Phosphatase: 52 IU/L (ref 47–123)
Bilirubin Total: 0.4 mg/dL (ref 0.0–1.2)
Bilirubin, Direct: 0.18 mg/dL (ref 0.00–0.40)
Total Protein: 6.4 g/dL (ref 6.0–8.5)

## 2024-08-27 LAB — BASIC METABOLIC PANEL WITH GFR
BUN/Creatinine Ratio: 18 (ref 10–24)
BUN: 28 mg/dL — ABNORMAL HIGH (ref 8–27)
CO2: 26 mmol/L (ref 20–29)
Calcium: 8.8 mg/dL (ref 8.6–10.2)
Chloride: 98 mmol/L (ref 96–106)
Creatinine, Ser: 1.55 mg/dL — ABNORMAL HIGH (ref 0.76–1.27)
Glucose: 262 mg/dL — ABNORMAL HIGH (ref 70–99)
Potassium: 4.9 mmol/L (ref 3.5–5.2)
Sodium: 136 mmol/L (ref 134–144)
eGFR: 47 mL/min/1.73 — ABNORMAL LOW (ref >59)

## 2024-08-27 LAB — ALPHA-1-ANTITRYPSIN PHENOTYP: A-1 Antitrypsin: 180 mg/dL (ref 101–187)

## 2024-08-28 NOTE — Assessment & Plan Note (Addendum)
 Lab Results  Component Value Date   CREATININE 1.55 (H) 08/24/2024   CREATININE 1.47 (H) 08/22/2024   CREATININE 1.54 (H) 08/21/2024     >>> no change baseline   He appears remarkably improved vs admit and just needs to more mindful of 02 needs as below  F/u can be q 6 m sooner prn          Each maintenance medication was reviewed in detail including emphasizing most importantly the difference between maintenance and prns and under what circumstances the prns are to be triggered using an action plan format where appropriate.  Total time for H and P, chart review, counseling, reviewing hfa/ 02 /pulse ox  device(s) and generating customized AVS unique to this office visit / same day charting = 42 min

## 2024-09-15 DIAGNOSIS — C61 Malignant neoplasm of prostate: Secondary | ICD-10-CM | POA: Diagnosis not present

## 2024-09-16 LAB — FUNGUS CULTURE WITH STAIN

## 2024-09-16 LAB — FUNGUS CULTURE RESULT

## 2024-09-16 LAB — FUNGAL ORGANISM REFLEX

## 2024-09-23 DIAGNOSIS — N393 Stress incontinence (female) (male): Secondary | ICD-10-CM | POA: Diagnosis not present

## 2024-09-23 DIAGNOSIS — C61 Malignant neoplasm of prostate: Secondary | ICD-10-CM | POA: Diagnosis not present

## 2024-11-01 ENCOUNTER — Telehealth: Payer: Self-pay | Admitting: *Deleted

## 2024-11-01 NOTE — Telephone Encounter (Signed)
 Copied from CRM [id number]. Topic: Clinical - Request for Lab/Test Order >> Oct 26, 2024  2:15 PM Leila C wrote: Reason for CRM: Annamary from [facility] [phone] wants to speak with someone, needing diagnostic codes for labs pH, Fluid from Dr. Pleas. Per CAL, send a message to clinical. Please fax the diagnostic codes [phone]  ------------------------------------------------------------------------------------------------------------------------------------------  I attempted to return a call from [place] at [facility] [phone].  LVM to return call.  According to his last OV with Dr. Darlean, his diagnosis were:  Nocturnal hypoxemia G47.34, COPD Gold J44.9, CAP right lung J18.9, DOE R06.09 and Chronic Kidney disease N18.31.  Will await return call.

## 2024-11-02 ENCOUNTER — Other Ambulatory Visit: Payer: Self-pay | Admitting: Internal Medicine

## 2024-11-03 MED ORDER — FLUTICASONE-SALMETEROL 500-50 MCG/ACT IN AEPB
1 | Freq: Two times a day (BID) | RESPIRATORY_TRACT | 0 refills
Start: 2024-11-03 — End: ?

## 2024-11-03 NOTE — Telephone Encounter (Signed)
 Called [place] and there was no answer- LMTCB

## 2024-11-04 MED ORDER — FLUTICASONE-SALMETEROL 500-50 MCG/ACT IN AEPB
1 | Freq: Two times a day (BID) | RESPIRATORY_TRACT | 0 refills | 30.00000 days | Status: AC
Start: 2024-11-04 — End: ?

## 2024-11-09 ENCOUNTER — Other Ambulatory Visit: Payer: BLUE CROSS/BLUE SHIELD

## 2024-11-09 MED ORDER — FLUTICASONE-SALMETEROL 500-50 MCG/ACT IN AEPB
1 | Freq: Two times a day (BID) | RESPIRATORY_TRACT | 0 refills
Start: 2024-11-09 — End: ?

## 2024-11-10 ENCOUNTER — Telehealth: Payer: BLUE CROSS/BLUE SHIELD

## 2024-11-10 NOTE — Telephone Encounter
 New Rx Request      Last seen by MD: 12/04/23    Reason for the request: Per pt's assistant Pt currently all out of meds and [facility] stated that [facility] only  accepts 82mo supply in order to deliver.      Otherwise 39mo supply must be sent to local pharmacy below    [id number] - [place], [place] - [address] AT [facility] OF [address] & [address] [phone]    Any Symptoms:  []  Yes  [x]  No      If yes, what symptoms are you experiencing:    Duration of symptoms (how long):    Have you taken medication for symptoms (OTC or Rx):      Is a particular medication being requested? fluticasone -salmeterol 500-50 mcg/act diskus inhaler    Was an appointment offered? No     Patient or caller has been notified of the turnaround time of 1-2 business day(s).

## 2024-11-11 ENCOUNTER — Ambulatory Visit

## 2024-11-11 VITALS — BP 124/64 | HR 63 | Temp 97.3°F | Ht 67.5 in | Wt 225.6 lb

## 2024-11-11 DIAGNOSIS — E1121 Type 2 diabetes mellitus with diabetic nephropathy: Secondary | ICD-10-CM

## 2024-11-11 DIAGNOSIS — I5033 Acute on chronic diastolic (congestive) heart failure: Secondary | ICD-10-CM

## 2024-11-11 DIAGNOSIS — I10 Essential (primary) hypertension: Secondary | ICD-10-CM

## 2024-11-11 DIAGNOSIS — Z8546 Personal history of malignant neoplasm of prostate: Secondary | ICD-10-CM | POA: Diagnosis not present

## 2024-11-11 DIAGNOSIS — M5442 Lumbago with sciatica, left side: Secondary | ICD-10-CM

## 2024-11-11 DIAGNOSIS — Z7984 Long term (current) use of oral hypoglycemic drugs: Secondary | ICD-10-CM | POA: Diagnosis not present

## 2024-11-11 DIAGNOSIS — J449 Chronic obstructive pulmonary disease, unspecified: Secondary | ICD-10-CM

## 2024-11-11 DIAGNOSIS — R5383 Other fatigue: Secondary | ICD-10-CM | POA: Diagnosis not present

## 2024-11-11 DIAGNOSIS — E781 Pure hyperglyceridemia: Secondary | ICD-10-CM

## 2024-11-11 DIAGNOSIS — N1831 Chronic kidney disease, stage 3a: Secondary | ICD-10-CM

## 2024-11-11 DIAGNOSIS — J4540 Moderate persistent asthma, uncomplicated: Secondary | ICD-10-CM

## 2024-11-11 MED ORDER — FLUTICASONE-SALMETEROL 500-50 MCG/ACT IN AEPB
1 | Freq: Two times a day (BID) | RESPIRATORY_TRACT | 0 refills
Start: 2024-11-11 — End: ?

## 2024-11-11 NOTE — Telephone Encounter
 Call Back Request      Reason for call back: Patient's assistant states patient is having adverse effects from not having his medication and would like to know the status on it Grayce is requesting for medication to be filled for 3 months and get sent through [facility] his chart) or [facility] [address], [place], [place] [address]  Any Symptoms:  []  Yes  [x]  No      If yes, what symptoms are you experiencing:    Duration of symptoms (how long):    Have you taken medication for symptoms (OTC or Rx):      If call was taken outside of clinic hours:    [] Patient or caller has been notified that this message was sent outside of normal clinic hours.     [] Patient or caller has been warm transferred to the physician's answering service. If applicable, patient or caller informed to please call us  back if symptoms progress.  Patient or caller has been notified of the turnaround time of 1-2 business day(s).

## 2024-11-12 ENCOUNTER — Ambulatory Visit (HOSPITAL_COMMUNITY): Admission: RE | Admit: 2024-11-12 | Discharge: 2024-11-12 | Disposition: A | Source: Ambulatory Visit

## 2024-11-12 DIAGNOSIS — M5442 Lumbago with sciatica, left side: Secondary | ICD-10-CM | POA: Insufficient documentation

## 2024-11-12 NOTE — Progress Notes (Signed)
 "  New Patient Office Visit  Subjective    Patient ID: Mark Cannon, male    DOB: 02-20-1951  Age: 74 y.o. MRN: [id number]  HPI Mark Cannon presents to establish care.  Discussed the use of AI scribe software for clinical note transcription with the patient, who gave verbal consent to proceed.  History of Present Illness Mark Cannon is a 75 year old male with diabetes who presents for diabetes management and follow-up.   He is currently taking Farxiga for diabetes, which he receives for free through a program. He previously tried Ozempic but discontinued it due to gastrointestinal side effects. He has not tried metformin or Mounjaro  yet. His last A1c was 9% in November. He does not regularly check his blood sugar at home but has the supplies to do so. He experiences increased thirst and dry mouth and consumes sugary beverages like regular Pepsi and Sprite.   He has a history of COPD and was recently hospitalized due to breathing difficulties. He follows up with pulmonology every six months but does not regularly use inhalers. He was discharged with an oxygen  tank at home but does not use it regularly as his oxygen  levels are stable without it. No history of sleep apnea and no current breathing issues.  He has a history of prostate cancer, for which he underwent prostatectomy and radiation therapy. He experiences increased urination frequency, especially at night, and has been prescribed Lasix , which he takes regularly.   He experiences lower back pain and sciatica, which began about a week and a half ago. He has taken tramadol twice for the pain, which he finds effective. He also occasionally uses hydrocodone  for back pain when standing for long periods. He has a history of L4-L5 fusion surgery about 15-20 years ago. Says he takes pain medication maybe 2 times per week.  He reports numbness and tingling in the soles of his feet. He has not had a recent eye exam due to lack of  insurance but received new glasses about a year ago. He has Medicare but no additional eye or dental insurance.  He has a history of smoking, having quit in September 2023 after smoking a pack a day for over 50 years. He walks around his house for exercise, approximately 8-10 times a day, covering about 60 feet each time.    Past Medical History:  Diagnosis Date   Arthritis    Cancer (HCC) Me, Prostate   Prostate, has had prostate removed, has had radiation and still has cancer,   Chronic respiratory failure (HCC)    Colon polyps    Cough    Diabetes mellitus without complication (HCC)    Dyspnea    GERD (gastroesophageal reflux disease)    occasional    H/O tooth extraction week of 03/15/2015    History of kidney stones    Hypercholesteremia    Hypertension 1999   Lumbar stenosis with neurogenic claudication    Pneumonia    Pre-diabetes    Renal disorder    Sleep apnea    No cpap    Past Surgical History:  Procedure Laterality Date   APPENDECTOMY     BACK SURGERY     BIOPSY  12/03/2022   Procedure: BIOPSY;  Surgeon: Eartha Angelia Sieving, MD;  [facility];  Service: Gastroenterology;;   CHOLECYSTECTOMY     COLONOSCOPY  02/04/2012   Procedure: COLONOSCOPY;  Surgeon: Oneil DELENA Budge, MD;  [facility];  Service: Gastroenterology;  Laterality: N/A;   COLONOSCOPY N/A 09/22/2018   Procedure: COLONOSCOPY;  Surgeon: Mavis Anes, MD;  [facility];  Service: Gastroenterology;  Laterality: N/A;   COLONOSCOPY W/ POLYPECTOMY     COLONOSCOPY WITH PROPOFOL  N/A 10/15/2022   Procedure: COLONOSCOPY WITH PROPOFOL ;  Surgeon: Cindie Carlin POUR, DO;  [facility];  Service: Endoscopy;  Laterality: N/A;   COLONOSCOPY WITH PROPOFOL  N/A 12/03/2022   Procedure: COLONOSCOPY WITH PROPOFOL ;  Surgeon: Eartha Angelia Sieving, MD;  [facility];  Service: Gastroenterology;  Laterality: N/A;  12:00pm, asa 3   ENTEROSCOPY N/A 12/03/2022    Procedure: ENTEROSCOPY;  Surgeon: Eartha Angelia Sieving, MD;  [facility];  Service: Gastroenterology;  Laterality: N/A;  push enteroscopy   ESOPHAGOGASTRODUODENOSCOPY (EGD) WITH PROPOFOL  N/A 10/15/2022   Procedure: ESOPHAGOGASTRODUODENOSCOPY (EGD) WITH PROPOFOL ;  Surgeon: Cindie Carlin POUR, DO;  [facility];  Service: Endoscopy;  Laterality: N/A;   GIVENS CAPSULE STUDY N/A 11/14/2022   Procedure: GIVENS CAPSULE STUDY;  Surgeon: Shaaron Lamar HERO, MD;  [facility];  Service: Endoscopy;  Laterality: N/A;   HEMOSTASIS CLIP PLACEMENT  12/03/2022   Procedure: HEMOSTASIS CLIP PLACEMENT;  Surgeon: Eartha Angelia Sieving, MD;  [facility];  Service: Gastroenterology;;   HERNIA REPAIR     Bilateral inguinal hernia repair X 4   HOT HEMOSTASIS  12/03/2022   Procedure: HOT HEMOSTASIS (ARGON PLASMA COAGULATION/BICAP);  Surgeon: Eartha Angelia, Sieving, MD;  [facility];  Service: Gastroenterology;;   hydrocelectomy     left knee arthroscopy     LITHOTRIPSY     LYMPHADENECTOMY Bilateral 03/22/2015   Procedure: PELVIC LYMPHADENECTOMY;  Surgeon: Ricardo Likens, MD;  [facility];  Service: Urology;  Laterality: Bilateral;   NM MYOVIEW  LTD  2011   POLYPECTOMY  09/22/2018   Procedure: POLYPECTOMY;  Surgeon: Mavis Anes, MD;  [facility];  Service: Gastroenterology;;   POLYPECTOMY  10/15/2022   Procedure: POLYPECTOMY;  Surgeon: Cindie Carlin POUR, DO;  [facility];  Service: Endoscopy;;   POLYPECTOMY  12/03/2022   Procedure: POLYPECTOMY;  Surgeon: Eartha Angelia Sieving, MD;  [facility];  Service: Gastroenterology;;   ROBOT ASSISTED LAPAROSCOPIC RADICAL PROSTATECTOMY N/A 03/22/2015   Procedure: ROBOTIC ASSISTED LAPAROSCOPIC RADICAL PROSTATECTOMY WITH INDOCYANINE GREEN DYE INJECTION;  Surgeon: Ricardo Likens, MD;  [facility];  Service: Urology;  Laterality: N/A;   ROBOTIC ASSISTED LAPAROSCOPIC LYSIS OF  ADHESION  03/22/2015   Procedure: ROBOTIC ASSISTED LAPAROSCOPIC LYSIS OF ADHESION;  Surgeon: Ricardo Likens, MD;  [facility];  Service: Urology;;   SHOULDER SURGERY Right    SUBMUCOSAL LIFTING INJECTION  12/03/2022   Procedure: SUBMUCOSAL LIFTING INJECTION;  Surgeon: Eartha Angelia Sieving, MD;  [facility];  Service: Gastroenterology;;   VASECTOMY      Family History  Problem Relation Age of Onset   Heart failure Father        D.O.D. 1993   Stroke Father    Colon cancer Neg Hx     Social History   Socioeconomic History   Marital status: Married    Spouse name: Not on file   Number of children: Not on file   Years of education: Not on file   Highest education level: Not on file  Occupational History   Not on file  Tobacco Use   Smoking status: Former    Current packs/day: 1.00    Average packs/day: 1 pack/day for 55.0  years (55.0 ttl pk-yrs)    Types: Cigarettes    Passive exposure: Past   Smokeless tobacco: Never  Vaping Use   Vaping status: Never Used  Substance and Sexual Activity   Alcohol use: Not Currently    Alcohol/week: 1.0 standard drink of alcohol    Types: 1 Glasses of wine per week    Comment: one glass of wine once a month   Drug use: No   Sexual activity: Not on file  Other Topics Concern   Not on file  Social History Narrative   Not on file   Social Drivers of Health   Tobacco Use: Medium Risk (08/24/2024)   Patient History    Smoking Tobacco Use: Former    Smokeless Tobacco Use: Never    Passive Exposure: Past  Physicist, Medical Strain: Not on file  Food Insecurity: No Food Insecurity (08/16/2024)   Epic    Worried About Programme Researcher, Broadcasting/film/video in the Last Year: Never true    Ran Out of Food in the Last Year: Never true  Transportation Needs: No Transportation Needs (08/16/2024)   Epic    Lack of Transportation (Medical): No    Lack of Transportation (Non-Medical): No  Physical Activity: Not on file  Stress: Not on file   Social Connections: Unknown (08/16/2024)   Social Connection and Isolation Panel    Frequency of Communication with Friends and Family: More than three times a week    Frequency of Social Gatherings with Friends and Family: Twice a week    Attends Religious Services: Patient declined    Database Administrator or Organizations: Patient declined    Attends Banker Meetings: Patient declined    Marital Status: Married  Catering Manager Violence: Not At Risk (08/16/2024)   Epic    Fear of Current or Ex-Partner: No    Emotionally Abused: No    Physically Abused: No    Sexually Abused: No  Depression (PHQ2-9): Medium Risk (11/11/2024)   Depression (PHQ2-9)    PHQ-2 Score: 6  Alcohol Screen: Not on file  Housing: Low Risk (08/16/2024)   Epic    Unable to Pay for Housing in the Last Year: No    Number of Times Moved in the Last Year: 0    Homeless in the Last Year: No  Utilities: Not At Risk (08/16/2024)   Epic    Threatened with loss of utilities: No  Health Literacy: Not on file   Review of Systems  Constitutional:  Negative for fatigue.  Eyes:  Negative for visual disturbance.  Respiratory:  Negative for cough, chest tightness, shortness of breath and wheezing.   Cardiovascular:  Negative for chest pain, palpitations and leg swelling.  Gastrointestinal:  Negative for constipation, diarrhea, nausea and vomiting.  Endocrine: Positive for polydipsia and polyuria. Negative for polyphagia.  Genitourinary:  Positive for frequency. Negative for difficulty urinating, hematuria and urgency.  Musculoskeletal:  Positive for back pain.  Psychiatric/Behavioral:  Negative for sleep disturbance. The patient is not nervous/anxious.    Objective    Today's Vitals   11/11/24 1335 11/11/24 1400  BP: (!) 142/74 124/64  Pulse: 63   Temp: (!) 97.3 F (36.3 C)   SpO2: 92%   Weight: 225 lb 9.6 oz (102.3 kg)   Height: 5' 7.5 (1.715 m)    Body mass index is 34.81 kg/m.   Physical  Exam Vitals and nursing note reviewed.  Constitutional:      General: He is not in  acute distress.    Appearance: Normal appearance. He is not ill-appearing.  Cardiovascular:     Rate and Rhythm: Normal rate and regular rhythm.     Heart sounds: Normal heart sounds, S1 normal and S2 normal. No murmur heard. Pulmonary:     Effort: Pulmonary effort is normal. No respiratory distress.     Breath sounds: Normal breath sounds. No wheezing.  Musculoskeletal:     Right lower leg: No edema.     Left lower leg: No edema.     Comments: Lumbar spine/lower back: No ecchymosis, edema, or open wounds noted. No direct spine tenderness palpated on exam. Left paraspinous tenderness palpated on exam, radiating around the hip. Limited active ROM with flexion, extension, and lateral flexion due to pain elicited. Full active ROM with rotation of lumbar spine. Strength 5+. Normal gait.  BLE: Full ROM and strength of BLE. Sensation intact.    Neurological:     Mental Status: He is alert.  Psychiatric:        Mood and Affect: Mood normal.        Behavior: Behavior normal.        Thought Content: Thought content normal.        Judgment: Judgment normal.    Diabetic Foot Exam - Simple   Simple Foot Form Diabetic Foot exam was performed with the following findings: Yes 11/11/2024  1:30 PM  Visual Inspection No deformities, no ulcerations, no other skin breakdown bilaterally: Yes Sensation Testing Intact to touch and monofilament testing bilaterally: Yes Pulse Check Posterior Tibialis and Dorsalis pulse intact bilaterally: Yes Comments    Assessment & Plan:  1. Type 2 diabetes mellitus with diabetic nephropathy, without long-term current use of insulin  (HCC) (Primary) - Diabetes management suboptimal with A1c of 9. Increased urination possibly due to hyperglycemia or Lasix . No regular glucose monitoring or recent eye exams. -Educated patient to avoid foods and beverages high in carbohydrates and sugar.  Advised patient to increase walking as tolerated to 150 minutes per week to help with the management of diabetes. -Advised patient to wear supportive footwear and check feet regularly for diabetic foot ulcers. - Ordered A1c and urine test for protein and glucose. - Referred to clinical pharmacist for medication assistance. - Consider starting metformin after reviewing lab results. - Encouraged home glucose monitoring once or twice daily. - Discussed potential addition of Mounjaro  after reviewing lab results. - Encouraged annual dilated eye exam for diabetes management. - Hemoglobin A1c - Microalbumin / creatinine urine ratio - AMB Referral [facility]BCI Care Management  2. Essential hypertension - Blood pressure elevated during visit but typically well-controlled at home with valsartan . - Rechecked blood pressure during the visit. - Comprehensive metabolic panel with GFR  3. Fatigue, unspecified type - CBC with Differential/Platelet - TSH + free T4  4. Hypertriglyceridemia - Educated patient about incorporating diet changes such as minimizing fried, high fat, or processed foods in diet. Advised patient to increase exercise as tolerated to help increase good cholesterol levels.   - Lipid panel  5. Acute left-sided low back pain with left-sided sciatica - New left-sided low back pain with sciatica, possibly due to nerve inflammation. Pain improved with tramadol. - Ordered lumbar spine x-ray. - Consider steroids for inflammation after reviewing x-ray results and labs.  - Continue tramadol as needed for pain. - DG Lumbar Spine Complete  6. COPD mixed type (HCC) - COPD well-managed with no recent exacerbations. Uses albuterol  as needed. Oxygen  not currently used. - Continue albuterol  as  needed. - Monitor for increased use of albuterol . - Encouraged follow-up with pulmonology if symptoms worsen. - Checked pneumonia vaccine status and will need updated pneumonia vaccine. Will revisit this due to  vaccines being unavailable.   7. Chronic kidney disease, stage 3a (HCC) - Comprehensive metabolic panel with GFR - Microalbumin / creatinine urine ratio  8. Acute on chronic diastolic (congestive) heart failure (HCC) - Patient to continue following up with cardiology for the management of this, and will continue current medication regimen.   9. History of prostate cancer - Prostate cancer treated with prostatectomy and radiation. PSA levels slightly increased but remain low. - Continue monitoring PSA levels with urology.  - Discuss increased urination with urologist.  Return in about 8 weeks (around 01/06/2025).   Damien KATHEE Pringle, FNP  Note:  This document was prepared using Dragon voice recognition software and may include unintentional dictation errors.   "

## 2024-11-12 NOTE — Telephone Encounter (Signed)
 Left Mark Cannon message to return call

## 2024-11-14 ENCOUNTER — Ambulatory Visit: Payer: Self-pay

## 2024-11-14 DIAGNOSIS — E1121 Type 2 diabetes mellitus with diabetic nephropathy: Secondary | ICD-10-CM

## 2024-11-15 LAB — COMPREHENSIVE METABOLIC PANEL WITH GFR
ALT: 19 [IU]/L (ref 0–44)
AST: 18 [IU]/L (ref 0–40)
Albumin: 4.3 g/dL (ref 3.8–4.8)
Alkaline Phosphatase: 92 [IU]/L (ref 47–123)
BUN/Creatinine Ratio: 14 (ref 10–24)
BUN: 22 mg/dL (ref 8–27)
Bilirubin Total: 0.6 mg/dL (ref 0.0–1.2)
CO2: 24 mmol/L (ref 20–29)
Calcium: 9.7 mg/dL (ref 8.6–10.2)
Chloride: 90 mmol/L — ABNORMAL LOW (ref 96–106)
Creatinine, Ser: 1.53 mg/dL — ABNORMAL HIGH (ref 0.76–1.27)
Globulin, Total: 2.9 g/dL (ref 1.5–4.5)
Glucose: 527 mg/dL (ref 70–99)
Potassium: 4.7 mmol/L (ref 3.5–5.2)
Sodium: 131 mmol/L — ABNORMAL LOW (ref 134–144)
Total Protein: 7.2 g/dL (ref 6.0–8.5)
eGFR: 48 mL/min/{1.73_m2} — ABNORMAL LOW

## 2024-11-15 LAB — CBC WITH DIFFERENTIAL/PLATELET
Basophils Absolute: 0.1 10*3/uL (ref 0.0–0.2)
Basos: 1 %
EOS (ABSOLUTE): 0.2 10*3/uL (ref 0.0–0.4)
Eos: 3 %
Hematocrit: 46.7 % (ref 37.5–51.0)
Hemoglobin: 15.5 g/dL (ref 13.0–17.7)
Immature Grans (Abs): 0 10*3/uL (ref 0.0–0.1)
Immature Granulocytes: 0 %
Lymphocytes Absolute: 1.8 10*3/uL (ref 0.7–3.1)
Lymphs: 21 %
MCH: 30 pg (ref 26.6–33.0)
MCHC: 33.2 g/dL (ref 31.5–35.7)
MCV: 91 fL (ref 79–97)
Monocytes Absolute: 0.5 10*3/uL (ref 0.1–0.9)
Monocytes: 6 %
Neutrophils Absolute: 5.9 10*3/uL (ref 1.4–7.0)
Neutrophils: 69 %
Platelets: 264 10*3/uL (ref 150–450)
RBC: 5.16 x10E6/uL (ref 4.14–5.80)
RDW: 14.3 % (ref 11.6–15.4)
WBC: 8.6 10*3/uL (ref 3.4–10.8)

## 2024-11-15 LAB — LIPID PANEL
Chol/HDL Ratio: 4.1 ratio (ref 0.0–5.0)
Cholesterol, Total: 148 mg/dL (ref 100–199)
HDL: 36 mg/dL — ABNORMAL LOW
LDL Chol Calc (NIH): 45 mg/dL (ref 0–99)
Triglycerides: 463 mg/dL — ABNORMAL HIGH (ref 0–149)
VLDL Cholesterol Cal: 67 mg/dL — ABNORMAL HIGH (ref 5–40)

## 2024-11-15 LAB — TSH+FREE T4
Free T4: 1.31 ng/dL (ref 0.82–1.77)
TSH: 1.87 u[IU]/mL (ref 0.450–4.500)

## 2024-11-15 LAB — MICROALBUMIN / CREATININE URINE RATIO
Creatinine, Urine: 32.1 mg/dL
Microalb/Creat Ratio: 72 mg/g{creat} — ABNORMAL HIGH (ref 0–29)
Microalbumin, Urine: 23.2 ug/mL

## 2024-11-15 LAB — HEMOGLOBIN A1C
Est. average glucose Bld gHb Est-mCnc: >398 mg/dL
Hgb A1c MFr Bld: >15.5 — AB (ref 4.8–5.6)

## 2024-11-15 MED ORDER — TIRZEPATIDE 2.5 MG/0.5ML ~~LOC~~ SOAJ: 2.5000 mg | SUBCUTANEOUS | 0 refills | Status: AC

## 2024-11-15 MED ORDER — PEN NEEDLES 32G X 4 MM MISC: 1.0000 | Freq: Every day | 5 refills | Status: AC

## 2024-11-15 MED ORDER — LANTUS SOLOSTAR 100 UNIT/ML ~~LOC~~ SOPN: 20.0000 [IU] | PEN_INJECTOR | Freq: Every day | SUBCUTANEOUS | 99 refills | Status: AC

## 2024-11-15 NOTE — Progress Notes (Signed)
 Please call the patient to discuss his lab results.  I personally reviewed your lab results. Your blood sugar is 527, and your A1C is greater than 15.5%. This means that your diabetes is severely uncontrolled and putting you at immediate risk for serious complications, including diabetic ketoacidosis, dehydration, kidney damage, and hospitalization. We need to address this right away to bring your blood sugars down safely and prevent complications. I would like to start you on 20 units of long-acting insulin  daily. This is a once per day injection. I will also be starting you on Mounjaro , which is a GLP-1 that we discussed at our visit. It is a weekly injection, and will help with weight loss as well. I have put in an order for our clinical pharmacist, and she will be contacting you regarding education on the insulin , as well as starting the Mounjaro . She can also help with assistance on getting these medications paid for. I would also like for you to check your blood sugar at least once daily. Our goal is to eventually get your blood sugar down below 200. We can talk with Julie and see if she can get a continuous blood sugar monitor covered as well.  If you have symptoms such as nausea, vomiting, abdominal pain, confusion, or rapid breathing, go to the ER.  I would like to see you back in 4 weeks to see how you are doing. Let me know if you need anything until then.   Thanks,  Damien Pringle, FNP

## 2024-11-16 ENCOUNTER — Telehealth: Payer: Self-pay

## 2024-11-16 NOTE — Progress Notes (Unsigned)
 Care Guide Pharmacy Note  11/16/2024 Name: Mark Cannon MRN: [id number] DOB: 10-25-1950  Referred By: Gerard Damien NOVAK, FNP Reason for referral: Complex Care Management (Outreach to schedule with Pharm d )   Mark Cannon is a 74 y.o. year old male who is a primary care patient of Gerard Damien NOVAK, FNP.  Mark Cannon was referred to the pharmacist for assistance related to: DMII  An unsuccessful telephone outreach was attempted today to contact the patient who was referred to the pharmacy team for assistance with medication assistance. Additional attempts will be made to contact the patient.  Jeoffrey Buffalo , RMA     [facility]  [facility], [facility] Guide  Direct Dial: [phone]  Website: [email or web address]

## 2024-11-17 ENCOUNTER — Telehealth: Payer: Self-pay

## 2024-11-17 NOTE — Progress Notes (Signed)
 Care Guide Pharmacy Note  11/17/2024 Name: Mark Cannon MRN: [id number] DOB: [birth year]  Referred By: Gerard Damien NOVAK, FNP Reason for referral: Complex Care Management (Outreach to schedule with Pharm d )   Mark Cannon is a 74 y.o. year old male who is a primary care patient of Gerard Damien NOVAK, FNP.  Mark Cannon was referred to the pharmacist for assistance related to: DMII  A second unsuccessful telephone outreach was attempted today to contact the patient who was referred to the pharmacy team for assistance with medication assistance. Additional attempts will be made to contact the patient.  Mark Cannon , RMA     [facility]  [facility], [facility] Guide  Direct Dial: [phone]  Website: [email or web address]

## 2024-11-17 NOTE — Progress Notes (Signed)
 Care Guide Pharmacy Note  11/17/2024 Name: Mark Cannon MRN: [id number] DOB: 1951-07-12  Referred By: Gerard Damien NOVAK, FNP Reason for referral: Complex Care Management (Outreach to schedule with Pharm d )   Mark Cannon is a 74 y.o. year old male who is a primary care patient of Gerard Damien NOVAK, FNP.  Mark Cannon was referred to the pharmacist for assistance related to: DMII  Successful contact was made with the patient to discuss pharmacy services including being ready for the pharmacist to call at least 5 minutes before the scheduled appointment time and to have medication bottles and any blood pressure readings ready for review. The patient agreed to meet with the pharmacist via telephone visit on (date/time).11/30/2024  Jeoffrey Buffalo , RMA     [facility]  [facility], [facility] Guide  Direct Dial: [phone]  Website: [email or web address]

## 2024-11-17 NOTE — Telephone Encounter (Unsigned)
 Copied from CRM #[id number]. Topic: Clinical - Request for Lab/Test Order >> Nov 16, 2024 10:57 AM Celestine FALCON wrote: Reason for CRM: Annamary from [facility] [phone] wants to speak with CMA Sonny regarding needing diagnostic codes for labs pH, Fluid from Dr. Pleas. She wanted to leave her fax number as well in case Sonny needed it at [phone].

## 2024-11-17 NOTE — Telephone Encounter (Signed)
 Pulmonary did not do any lab work on 11/12/2024, this message needs to go to his PCP, Damien Pringle FNP.  Forwarding message to PCP.

## 2024-11-24 ENCOUNTER — Ambulatory Visit

## 2024-11-29 ENCOUNTER — Other Ambulatory Visit

## 2025-01-06 ENCOUNTER — Ambulatory Visit
